# Patient Record
Sex: Female | Born: 1950
Health system: Southern US, Community
[De-identification: ages and names within clinical notes are randomized; demographics above are authoritative.]

## PROBLEM LIST (undated history)

## (undated) DIAGNOSIS — E669 Obesity, unspecified: Secondary | ICD-10-CM

## (undated) DIAGNOSIS — I509 Heart failure, unspecified: Secondary | ICD-10-CM

## (undated) DIAGNOSIS — I10 Essential (primary) hypertension: Secondary | ICD-10-CM

## (undated) DIAGNOSIS — I493 Ventricular premature depolarization: Secondary | ICD-10-CM

## (undated) HISTORY — PX: TUBAL LIGATION: SHX77

## (undated) HISTORY — PX: TONSILLECTOMY: SUR1361

## (undated) HISTORY — PX: CHOLECYSTECTOMY: SHX55

## (undated) HISTORY — PX: ABDOMINAL HYSTERECTOMY: SHX81

## (undated) HISTORY — DX: Obesity, unspecified: E66.9

## (undated) HISTORY — DX: Ventricular premature depolarization: I49.3

---

## 2001-05-13 ENCOUNTER — Emergency Department (HOSPITAL_COMMUNITY): Admission: EM | Admit: 2001-05-13 | Discharge: 2001-05-14 | Payer: Self-pay | Admitting: Emergency Medicine

## 2002-10-12 ENCOUNTER — Emergency Department (HOSPITAL_COMMUNITY): Admission: EM | Admit: 2002-10-12 | Discharge: 2002-10-12 | Payer: Self-pay | Admitting: Emergency Medicine

## 2002-10-12 ENCOUNTER — Encounter: Payer: Self-pay | Admitting: Emergency Medicine

## 2003-07-02 ENCOUNTER — Emergency Department (HOSPITAL_COMMUNITY): Admission: AD | Admit: 2003-07-02 | Discharge: 2003-07-02 | Payer: Self-pay | Admitting: Family Medicine

## 2004-10-12 ENCOUNTER — Emergency Department (HOSPITAL_COMMUNITY): Admission: EM | Admit: 2004-10-12 | Discharge: 2004-10-12 | Payer: Self-pay | Admitting: Family Medicine

## 2005-11-18 ENCOUNTER — Emergency Department (HOSPITAL_COMMUNITY): Admission: EM | Admit: 2005-11-18 | Discharge: 2005-11-18 | Payer: Self-pay | Admitting: Emergency Medicine

## 2007-03-29 ENCOUNTER — Emergency Department (HOSPITAL_COMMUNITY): Admission: EM | Admit: 2007-03-29 | Discharge: 2007-03-29 | Payer: Self-pay | Admitting: Emergency Medicine

## 2008-07-14 ENCOUNTER — Ambulatory Visit: Payer: Self-pay | Admitting: Hematology & Oncology

## 2008-08-29 ENCOUNTER — Ambulatory Visit: Payer: Self-pay | Admitting: Diagnostic Radiology

## 2008-08-29 ENCOUNTER — Ambulatory Visit: Payer: Self-pay | Admitting: Hematology & Oncology

## 2008-08-29 ENCOUNTER — Ambulatory Visit (HOSPITAL_BASED_OUTPATIENT_CLINIC_OR_DEPARTMENT_OTHER): Admission: RE | Admit: 2008-08-29 | Discharge: 2008-08-29 | Payer: Self-pay | Admitting: Hematology & Oncology

## 2008-08-29 LAB — CBC WITH DIFFERENTIAL (CANCER CENTER ONLY)
BASO#: 0 10*3/uL (ref 0.0–0.2)
Eosinophils Absolute: 0.1 10*3/uL (ref 0.0–0.5)
HCT: 50.5 % — ABNORMAL HIGH (ref 34.8–46.6)
HGB: 16.2 g/dL — ABNORMAL HIGH (ref 11.6–15.9)
LYMPH%: 38.7 % (ref 14.0–48.0)
MCV: 84 fL (ref 81–101)
MONO#: 0.5 10*3/uL (ref 0.1–0.9)
NEUT%: 47.6 % (ref 39.6–80.0)
RBC: 6.05 10*6/uL — ABNORMAL HIGH (ref 3.70–5.32)
WBC: 4.3 10*3/uL (ref 3.9–10.0)

## 2008-09-03 LAB — COMPREHENSIVE METABOLIC PANEL
AST: 19 U/L (ref 0–37)
Alkaline Phosphatase: 65 U/L (ref 39–117)
BUN: 13 mg/dL (ref 6–23)
Creatinine, Ser: 0.69 mg/dL (ref 0.40–1.20)
Total Bilirubin: 0.6 mg/dL (ref 0.3–1.2)

## 2008-09-03 LAB — SPEP & IFE WITH QIG
Alpha-2-Globulin: 9.8 % (ref 7.1–11.8)
Beta 2: 8.2 % — ABNORMAL HIGH (ref 3.2–6.5)
Beta Globulin: 5.6 % (ref 4.7–7.2)
IgA: 613 mg/dL — ABNORMAL HIGH (ref 68–378)
IgG (Immunoglobin G), Serum: 2630 mg/dL — ABNORMAL HIGH (ref 694–1618)

## 2008-09-03 LAB — KAPPA/LAMBDA LIGHT CHAINS
Kappa:Lambda Ratio: 0.62 (ref 0.26–1.65)
Lambda Free Lght Chn: 4.81 mg/dL — ABNORMAL HIGH (ref 0.57–2.63)

## 2008-10-03 LAB — CBC WITH DIFFERENTIAL (CANCER CENTER ONLY)
Eosinophils Absolute: 0.1 10*3/uL (ref 0.0–0.5)
HGB: 15.5 g/dL (ref 11.6–15.9)
MONO#: 0.4 10*3/uL (ref 0.1–0.9)
NEUT#: 2.4 10*3/uL (ref 1.5–6.5)
Platelets: 174 10*3/uL (ref 145–400)
RBC: 5.76 10*6/uL — ABNORMAL HIGH (ref 3.70–5.32)
WBC: 4.4 10*3/uL (ref 3.9–10.0)

## 2008-10-07 LAB — COMPREHENSIVE METABOLIC PANEL
ALT: 19 U/L (ref 0–35)
AST: 15 U/L (ref 0–37)
Albumin: 3.7 g/dL (ref 3.5–5.2)
Calcium: 9.3 mg/dL (ref 8.4–10.5)
Chloride: 101 mEq/L (ref 96–112)
Creatinine, Ser: 0.69 mg/dL (ref 0.40–1.20)
Potassium: 3.4 mEq/L — ABNORMAL LOW (ref 3.5–5.3)
Sodium: 141 mEq/L (ref 135–145)
Total Protein: 8.8 g/dL — ABNORMAL HIGH (ref 6.0–8.3)

## 2008-10-07 LAB — PROTEIN ELECTROPHORESIS, SERUM
Alpha-2-Globulin: 11.7 % (ref 7.1–11.8)
Gamma Globulin: 29.8 % — ABNORMAL HIGH (ref 11.1–18.8)

## 2008-10-07 LAB — KAPPA/LAMBDA LIGHT CHAINS: Kappa free light chain: 2.33 mg/dL — ABNORMAL HIGH (ref 0.33–1.94)

## 2009-02-12 ENCOUNTER — Ambulatory Visit: Payer: Self-pay | Admitting: Hematology & Oncology

## 2009-02-13 LAB — CBC WITH DIFFERENTIAL (CANCER CENTER ONLY)
EOS%: 3.1 % (ref 0.0–7.0)
Eosinophils Absolute: 0.1 10*3/uL (ref 0.0–0.5)
LYMPH#: 1.7 10*3/uL (ref 0.9–3.3)
MCH: 27.2 pg (ref 26.0–34.0)
MCHC: 33.7 g/dL (ref 32.0–36.0)
MONO%: 10.6 % (ref 0.0–13.0)
NEUT#: 2.2 10*3/uL (ref 1.5–6.5)
Platelets: 162 10*3/uL (ref 145–400)
RBC: 5.47 10*6/uL — ABNORMAL HIGH (ref 3.70–5.32)

## 2009-02-17 LAB — KAPPA/LAMBDA LIGHT CHAINS: Kappa free light chain: 1.85 mg/dL (ref 0.33–1.94)

## 2009-02-17 LAB — SPEP & IFE WITH QIG
Albumin ELP: 43.9 % — ABNORMAL LOW (ref 55.8–66.1)
IgA: 554 mg/dL — ABNORMAL HIGH (ref 68–378)
IgM, Serum: 80 mg/dL (ref 60–263)
Total Protein, Serum Electrophoresis: 8.2 g/dL (ref 6.0–8.3)

## 2009-02-17 LAB — COMPREHENSIVE METABOLIC PANEL
ALT: 20 U/L (ref 0–35)
Albumin: 3.6 g/dL (ref 3.5–5.2)
CO2: 26 mEq/L (ref 19–32)
Chloride: 104 mEq/L (ref 96–112)
Glucose, Bld: 111 mg/dL — ABNORMAL HIGH (ref 70–99)
Potassium: 3.7 mEq/L (ref 3.5–5.3)
Sodium: 141 mEq/L (ref 135–145)
Total Bilirubin: 0.6 mg/dL (ref 0.3–1.2)
Total Protein: 8.2 g/dL (ref 6.0–8.3)

## 2009-06-18 ENCOUNTER — Ambulatory Visit: Payer: Self-pay | Admitting: Hematology & Oncology

## 2009-06-19 LAB — CBC WITH DIFFERENTIAL (CANCER CENTER ONLY)
BASO#: 0 10*3/uL (ref 0.0–0.2)
Eosinophils Absolute: 0.1 10*3/uL (ref 0.0–0.5)
HCT: 48.1 % — ABNORMAL HIGH (ref 34.8–46.6)
HGB: 15.7 g/dL (ref 11.6–15.9)
LYMPH%: 38 % (ref 14.0–48.0)
MCH: 26.2 pg (ref 26.0–34.0)
MCHC: 32.6 g/dL (ref 32.0–36.0)
MCV: 80 fL — ABNORMAL LOW (ref 81–101)
MONO#: 0.4 10*3/uL (ref 0.1–0.9)
NEUT#: 2.6 10*3/uL (ref 1.5–6.5)
NEUT%: 51.5 % (ref 39.6–80.0)
RBC: 5.99 10*6/uL — ABNORMAL HIGH (ref 3.70–5.32)
RDW: 13.2 % (ref 10.5–14.6)
WBC: 5 10*3/uL (ref 3.9–10.0)

## 2009-06-19 LAB — TECHNOLOGIST REVIEW CHCC SATELLITE

## 2009-06-23 LAB — COMPREHENSIVE METABOLIC PANEL
ALT: 28 U/L (ref 0–35)
BUN: 14 mg/dL (ref 6–23)
CO2: 27 mEq/L (ref 19–32)
Calcium: 8.9 mg/dL (ref 8.4–10.5)
Chloride: 102 mEq/L (ref 96–112)
Creatinine, Ser: 0.73 mg/dL (ref 0.40–1.20)
Glucose, Bld: 125 mg/dL — ABNORMAL HIGH (ref 70–99)

## 2009-06-23 LAB — SPEP & IFE WITH QIG
Alpha-1-Globulin: 3.5 % (ref 2.9–4.9)
Alpha-2-Globulin: 10.3 % (ref 7.1–11.8)
Gamma Globulin: 28.3 % — ABNORMAL HIGH (ref 11.1–18.8)

## 2009-06-23 LAB — KAPPA/LAMBDA LIGHT CHAINS
Kappa free light chain: 2.39 mg/dL — ABNORMAL HIGH (ref 0.33–1.94)
Lambda Free Lght Chn: 4.01 mg/dL — ABNORMAL HIGH (ref 0.57–2.63)

## 2009-06-23 LAB — BETA 2 MICROGLOBULIN, SERUM: Beta-2 Microglobulin: 2.77 mg/L — ABNORMAL HIGH (ref 1.01–1.73)

## 2009-10-29 ENCOUNTER — Ambulatory Visit: Payer: Self-pay | Admitting: Hematology & Oncology

## 2009-10-30 LAB — CBC WITH DIFFERENTIAL (CANCER CENTER ONLY)
BASO%: 1.2 % (ref 0.0–2.0)
EOS%: 2.3 % (ref 0.0–7.0)
LYMPH#: 1.9 10*3/uL (ref 0.9–3.3)
MCHC: 32.7 g/dL (ref 32.0–36.0)
NEUT#: 1.7 10*3/uL (ref 1.5–6.5)
Platelets: 159 10*3/uL (ref 145–400)
RDW: 14.1 % (ref 10.5–14.6)

## 2009-11-03 LAB — PROTEIN ELECTROPHORESIS, SERUM
Alpha-1-Globulin: 3.7 % (ref 2.9–4.9)
Beta 2: 8.2 % — ABNORMAL HIGH (ref 3.2–6.5)
Beta Globulin: 6.3 % (ref 4.7–7.2)
Gamma Globulin: 28.8 % — ABNORMAL HIGH (ref 11.1–18.8)

## 2009-11-03 LAB — COMPREHENSIVE METABOLIC PANEL
AST: 18 U/L (ref 0–37)
Albumin: 3.6 g/dL (ref 3.5–5.2)
Alkaline Phosphatase: 61 U/L (ref 39–117)
Potassium: 3.3 mEq/L — ABNORMAL LOW (ref 3.5–5.3)
Sodium: 140 mEq/L (ref 135–145)
Total Protein: 7.9 g/dL (ref 6.0–8.3)

## 2009-11-03 LAB — KAPPA/LAMBDA LIGHT CHAINS
Kappa free light chain: 2.18 mg/dL — ABNORMAL HIGH (ref 0.33–1.94)
Kappa:Lambda Ratio: 0.76 (ref 0.26–1.65)

## 2010-04-02 ENCOUNTER — Ambulatory Visit: Payer: Self-pay | Admitting: Hematology & Oncology

## 2010-04-02 LAB — CBC WITH DIFFERENTIAL (CANCER CENTER ONLY)
BASO%: 0.9 % (ref 0.0–2.0)
Eosinophils Absolute: 0.1 10*3/uL (ref 0.0–0.5)
LYMPH#: 1.7 10*3/uL (ref 0.9–3.3)
LYMPH%: 32.4 % (ref 14.0–48.0)
MCV: 82 fL (ref 81–101)
MONO#: 0.4 10*3/uL (ref 0.1–0.9)
NEUT#: 2.9 10*3/uL (ref 1.5–6.5)
Platelets: 205 10*3/uL (ref 145–400)
RBC: 5.73 10*6/uL — ABNORMAL HIGH (ref 3.70–5.32)
WBC: 5.2 10*3/uL (ref 3.9–10.0)

## 2010-04-02 LAB — TECHNOLOGIST REVIEW CHCC SATELLITE

## 2010-04-06 LAB — COMPREHENSIVE METABOLIC PANEL
ALT: 20 U/L (ref 0–35)
AST: 15 U/L (ref 0–37)
Albumin: 4.1 g/dL (ref 3.5–5.2)
CO2: 25 mEq/L (ref 19–32)
Calcium: 9 mg/dL (ref 8.4–10.5)
Chloride: 104 mEq/L (ref 96–112)
Potassium: 3.7 mEq/L (ref 3.5–5.3)
Sodium: 139 mEq/L (ref 135–145)
Total Protein: 8.5 g/dL — ABNORMAL HIGH (ref 6.0–8.3)

## 2010-04-06 LAB — SPEP & IFE WITH QIG
Alpha-1-Globulin: 3.6 % (ref 2.9–4.9)
Alpha-2-Globulin: 10 % (ref 7.1–11.8)
Gamma Globulin: 29.2 % — ABNORMAL HIGH (ref 11.1–18.8)
IgM, Serum: 74 mg/dL (ref 60–263)
Total Protein, Serum Electrophoresis: 8.5 g/dL — ABNORMAL HIGH (ref 6.0–8.3)

## 2010-04-06 LAB — KAPPA/LAMBDA LIGHT CHAINS: Kappa free light chain: 1.7 mg/dL (ref 0.33–1.94)

## 2010-04-16 ENCOUNTER — Encounter: Admission: RE | Admit: 2010-04-16 | Discharge: 2010-04-16 | Payer: Self-pay | Admitting: Hematology & Oncology

## 2010-07-01 ENCOUNTER — Emergency Department (INDEPENDENT_AMBULATORY_CARE_PROVIDER_SITE_OTHER): Payer: 59

## 2010-07-01 ENCOUNTER — Emergency Department (HOSPITAL_BASED_OUTPATIENT_CLINIC_OR_DEPARTMENT_OTHER)
Admission: EM | Admit: 2010-07-01 | Discharge: 2010-07-02 | Disposition: A | Discharge status: Home / Self Care | Payer: 59 | Attending: Emergency Medicine | Admitting: Emergency Medicine

## 2010-07-01 ENCOUNTER — Encounter (HOSPITAL_BASED_OUTPATIENT_CLINIC_OR_DEPARTMENT_OTHER): Payer: Self-pay | Admitting: Radiology

## 2010-07-01 DIAGNOSIS — M1710 Unilateral primary osteoarthritis, unspecified knee: Secondary | ICD-10-CM

## 2010-07-01 DIAGNOSIS — M79609 Pain in unspecified limb: Secondary | ICD-10-CM

## 2010-07-01 DIAGNOSIS — IMO0002 Reserved for concepts with insufficient information to code with codable children: Secondary | ICD-10-CM

## 2010-07-01 DIAGNOSIS — Z86718 Personal history of other venous thrombosis and embolism: Secondary | ICD-10-CM | POA: Insufficient documentation

## 2010-07-01 DIAGNOSIS — M7989 Other specified soft tissue disorders: Secondary | ICD-10-CM

## 2010-07-01 DIAGNOSIS — I10 Essential (primary) hypertension: Secondary | ICD-10-CM | POA: Insufficient documentation

## 2010-07-01 HISTORY — DX: Heart failure, unspecified: I50.9

## 2010-07-02 ENCOUNTER — Ambulatory Visit (INDEPENDENT_AMBULATORY_CARE_PROVIDER_SITE_OTHER)
Admission: RE | Admit: 2010-07-02 | Discharge: 2010-07-02 | Disposition: A | Discharge status: Home / Self Care | Payer: 59 | Source: Ambulatory Visit | Attending: Emergency Medicine | Admitting: Emergency Medicine

## 2010-07-02 ENCOUNTER — Other Ambulatory Visit (HOSPITAL_BASED_OUTPATIENT_CLINIC_OR_DEPARTMENT_OTHER): Payer: Self-pay | Admitting: Emergency Medicine

## 2010-07-02 DIAGNOSIS — R52 Pain, unspecified: Secondary | ICD-10-CM

## 2010-07-14 ENCOUNTER — Emergency Department (HOSPITAL_BASED_OUTPATIENT_CLINIC_OR_DEPARTMENT_OTHER)
Admission: EM | Admit: 2010-07-14 | Discharge: 2010-07-14 | Disposition: A | Discharge status: Home / Self Care | Payer: 59 | Attending: Emergency Medicine | Admitting: Emergency Medicine

## 2010-07-14 DIAGNOSIS — M25559 Pain in unspecified hip: Secondary | ICD-10-CM | POA: Insufficient documentation

## 2010-07-14 DIAGNOSIS — M76899 Other specified enthesopathies of unspecified lower limb, excluding foot: Secondary | ICD-10-CM | POA: Insufficient documentation

## 2010-07-14 LAB — URINE MICROSCOPIC-ADD ON

## 2010-07-14 LAB — URINALYSIS, ROUTINE W REFLEX MICROSCOPIC
Ketones, ur: NEGATIVE mg/dL
Urine Glucose, Fasting: NEGATIVE mg/dL
pH: 6 (ref 5.0–8.0)

## 2010-10-01 ENCOUNTER — Other Ambulatory Visit: Payer: Self-pay | Admitting: Hematology & Oncology

## 2010-10-01 ENCOUNTER — Encounter (HOSPITAL_BASED_OUTPATIENT_CLINIC_OR_DEPARTMENT_OTHER): Payer: 59 | Admitting: Hematology & Oncology

## 2010-10-01 DIAGNOSIS — D472 Monoclonal gammopathy: Secondary | ICD-10-CM

## 2010-10-01 LAB — CBC WITH DIFFERENTIAL (CANCER CENTER ONLY)
BASO%: 0.4 % (ref 0.0–2.0)
EOS%: 1.8 % (ref 0.0–7.0)
HCT: 45.1 % (ref 34.8–46.6)
LYMPH%: 40.8 % (ref 14.0–48.0)
MCHC: 33 g/dL (ref 32.0–36.0)
MCV: 80 fL — ABNORMAL LOW (ref 81–101)
MONO#: 0.6 10*3/uL (ref 0.1–0.9)
MONO%: 12.7 % (ref 0.0–13.0)
NEUT%: 44.3 % (ref 39.6–80.0)
Platelets: 166 10*3/uL (ref 145–400)
RDW: 14.7 % (ref 11.1–15.7)
WBC: 4.5 10*3/uL (ref 3.9–10.0)

## 2010-10-05 LAB — COMPREHENSIVE METABOLIC PANEL
ALT: 16 U/L (ref 0–35)
AST: 17 U/L (ref 0–37)
CO2: 27 mEq/L (ref 19–32)
Chloride: 101 mEq/L (ref 96–112)
Creatinine, Ser: 0.66 mg/dL (ref 0.40–1.20)
Sodium: 140 mEq/L (ref 135–145)
Total Bilirubin: 0.5 mg/dL (ref 0.3–1.2)
Total Protein: 8.2 g/dL (ref 6.0–8.3)

## 2010-10-05 LAB — SPEP & IFE WITH QIG
Albumin ELP: 44.2 % — ABNORMAL LOW (ref 55.8–66.1)
Alpha-1-Globulin: 3.6 % (ref 2.9–4.9)
Alpha-2-Globulin: 10.2 % (ref 7.1–11.8)
Gamma Globulin: 28.2 % — ABNORMAL HIGH (ref 11.1–18.8)
IgM, Serum: 74 mg/dL (ref 60–263)
Total Protein, Serum Electrophoresis: 8.2 g/dL (ref 6.0–8.3)

## 2010-10-05 LAB — LACTATE DEHYDROGENASE: LDH: 149 U/L (ref 94–250)

## 2010-10-05 LAB — BETA 2 MICROGLOBULIN, SERUM: Beta-2 Microglobulin: 2.2 mg/L — ABNORMAL HIGH (ref 1.01–1.73)

## 2011-03-23 ENCOUNTER — Other Ambulatory Visit: Payer: Self-pay | Admitting: Hematology & Oncology

## 2011-03-23 DIAGNOSIS — Z1231 Encounter for screening mammogram for malignant neoplasm of breast: Secondary | ICD-10-CM

## 2011-04-11 ENCOUNTER — Encounter: Payer: Self-pay | Admitting: *Deleted

## 2011-04-18 ENCOUNTER — Ambulatory Visit
Admission: RE | Admit: 2011-04-18 | Discharge: 2011-04-18 | Disposition: A | Discharge status: Home / Self Care | Payer: 59 | Source: Ambulatory Visit | Attending: Hematology & Oncology | Admitting: Hematology & Oncology

## 2011-04-18 DIAGNOSIS — Z1231 Encounter for screening mammogram for malignant neoplasm of breast: Secondary | ICD-10-CM

## 2011-04-22 ENCOUNTER — Ambulatory Visit: Payer: 59 | Admitting: Hematology & Oncology

## 2011-04-22 ENCOUNTER — Other Ambulatory Visit: Payer: 59 | Admitting: Lab

## 2011-04-22 ENCOUNTER — Telehealth: Payer: Self-pay | Admitting: *Deleted

## 2011-04-22 NOTE — Telephone Encounter (Signed)
Pt cx 11-23 moved to 12-26 RN aware

## 2011-05-25 ENCOUNTER — Other Ambulatory Visit: Payer: Self-pay | Admitting: Hematology & Oncology

## 2011-05-25 ENCOUNTER — Other Ambulatory Visit (HOSPITAL_BASED_OUTPATIENT_CLINIC_OR_DEPARTMENT_OTHER): Payer: 59 | Admitting: Lab

## 2011-05-25 ENCOUNTER — Ambulatory Visit (HOSPITAL_BASED_OUTPATIENT_CLINIC_OR_DEPARTMENT_OTHER): Payer: 59 | Admitting: Hematology & Oncology

## 2011-05-25 VITALS — BP 155/88 | HR 76 | Temp 98.0°F | Ht 65.0 in | Wt 294.0 lb

## 2011-05-25 DIAGNOSIS — D472 Monoclonal gammopathy: Secondary | ICD-10-CM

## 2011-05-25 DIAGNOSIS — M7989 Other specified soft tissue disorders: Secondary | ICD-10-CM

## 2011-05-25 LAB — LACTATE DEHYDROGENASE
LDH: 183 U/L (ref 94–250)
LDH: 183 U/L (ref 94–250)

## 2011-05-25 LAB — CBC WITH DIFFERENTIAL (CANCER CENTER ONLY)
BASO#: 0 10*3/uL (ref 0.0–0.2)
BASO%: 0.4 % (ref 0.0–2.0)
EOS%: 2.7 % (ref 0.0–7.0)
HGB: 15.2 g/dL (ref 11.6–15.9)
LYMPH#: 1.9 10*3/uL (ref 0.9–3.3)
MCHC: 32.6 g/dL (ref 32.0–36.0)
NEUT#: 2.3 10*3/uL (ref 1.5–6.5)
Platelets: 158 10*3/uL (ref 145–400)

## 2011-05-25 LAB — VITAMIN D 25 HYDROXY (VIT D DEFICIENCY, FRACTURES): Vit D, 25-Hydroxy: 12 ng/mL — ABNORMAL LOW (ref 30–89)

## 2011-05-25 NOTE — Progress Notes (Signed)
This office note has been dictated.

## 2011-05-25 NOTE — Progress Notes (Signed)
CC:   Michaela Baker. Michaela Baker, M.D.  DIAGNOSIS:  IgG lambda monoclonal gammopathy of undetermined significance.  CURRENT THERAPY:  Observation.  INTERIM HISTORY:  Michaela Baker comes in for followup.  We last saw her back in May.  She had a nice summer.  She really did not do all that much.  She enjoyed the holidays with her family.  When we last saw her, there was no monoclonal spike in her serum.  Her IgG level was 2580 mg/dL.  Her serum lambda light chain was 3.93 mg/dL.  She has had some leg swelling.  This comes and goes.  She is taking fish oil.  This really has helped her arthralgias.  She has had no change in bowel or bladder habits.  She has not noticed any bleeding.  She said her mammogram was about a month or so ago.  PHYSICAL EXAMINATION:  General:  This is an obese black female in no obvious distress.  Vital Signs:  Temperature 98, pulse 76, respiratory rate 16, blood pressure 155/88.  Weight is 294.  Head/Neck: Exam shows a normocephalic and atraumatic skull.  There are no ocular or oral lesions.  There are no palpable cervical or supraclavicular lymph nodes. Lungs:  Clear bilaterally.  Cardiac:  Regular rate and rhythm with a normal S1 and S2.  There are no murmurs, rubs or bruits.  Abdomen:  Soft with good bowel sounds.  There is no palpable abdominal mass.  There is no fluid wave.  There is no palpable hepatosplenomegaly.  She does have a laparotomy scar that is well healed.  Back:  No tenderness over the spine, ribs or hips.  Extremities:  No clubbing or cyanosis.  Some trace edema in her legs bilaterally.  She has no erythema or warmth over her joints.  Skin:  No ecchymoses or petechiae.  LABORATORY STUDIES:  White cell count 4.8, hemoglobin 15.2, hematocrit 46.6, platelet count 158.  MCV is 82.  IMPRESSION:  Michaela Baker is a 60 year old African American female with an IgG lambda monoclonal gammopathy of undetermined significance.  This has really not been a  problem.  I think we can probably get her back in 8 months now for followup.  I do not see any need for x-rays or invasive studies on her.   ______________________________ Josph Macho, M.D. PRE/MEDQ  D:  05/25/2011  T:  05/25/2011  Job:  [id number]   ADDENDUM:  (-) M-spike.  IgG is 3000 mg/dL. LAMBDA is 4.64 mg/dL

## 2011-05-26 LAB — LACTATE DEHYDROGENASE: LDH: 183 U/L (ref 94–250)

## 2011-05-26 LAB — VITAMIN D 25 HYDROXY (VIT D DEFICIENCY, FRACTURES): Vit D, 25-Hydroxy: 12 ng/mL — ABNORMAL LOW (ref 30–89)

## 2011-05-26 LAB — KAPPA/LAMBDA LIGHT CHAINS: Kappa:Lambda Ratio: 0.94 (ref 0.26–1.65)

## 2011-05-27 LAB — SPEP & IFE WITH QIG
Albumin ELP: 42.8 % — ABNORMAL LOW (ref 55.8–66.1)
Beta 2: 8.5 % — ABNORMAL HIGH (ref 3.2–6.5)
IgA: 733 mg/dL — ABNORMAL HIGH (ref 69–380)
IgM, Serum: 79 mg/dL (ref 52–322)

## 2012-01-02 ENCOUNTER — Ambulatory Visit (HOSPITAL_BASED_OUTPATIENT_CLINIC_OR_DEPARTMENT_OTHER): Payer: 59 | Admitting: Hematology & Oncology

## 2012-01-02 ENCOUNTER — Other Ambulatory Visit (HOSPITAL_BASED_OUTPATIENT_CLINIC_OR_DEPARTMENT_OTHER): Payer: 59 | Admitting: Lab

## 2012-01-02 VITALS — BP 139/81 | HR 77 | Temp 97.5°F | Resp 20 | Wt 294.0 lb

## 2012-01-02 DIAGNOSIS — D472 Monoclonal gammopathy: Secondary | ICD-10-CM

## 2012-01-02 DIAGNOSIS — M7989 Other specified soft tissue disorders: Secondary | ICD-10-CM

## 2012-01-02 LAB — CBC WITH DIFFERENTIAL (CANCER CENTER ONLY)
BASO#: 0 10*3/uL (ref 0.0–0.2)
EOS%: 2.5 % (ref 0.0–7.0)
MCH: 26.9 pg (ref 26.0–34.0)
MCHC: 32.6 g/dL (ref 32.0–36.0)
MONO%: 14.3 % — ABNORMAL HIGH (ref 0.0–13.0)
NEUT#: 1.7 10*3/uL (ref 1.5–6.5)
Platelets: 183 10*3/uL (ref 145–400)

## 2012-01-02 NOTE — Progress Notes (Signed)
CC:   Sandford Craze, NP  DIAGNOSIS:  IgG Lambda MGUS.  CURRENT THERAPY:  Observation.  INTERIM HISTORY:  Ms. Dunphy comes in for followup.  She seems to be doing okay.  She has had no problems since we last saw her in December. When we saw her at that point time, there was no monoclonal spike noted in her serum.  Her IgG level was 3000 mg/dL.  Her lambda light chain was 4.64 mg/dL.  She has leg swelling.  This is a chronic problem for her.  She has had no infections.  There has been no cough.  She has had no change in bowel or bladder habits.  She said her last mammogram was back in November of 2012. __________mammogram.  PHYSICAL EXAMINATION:  General:  This is an obese black female in no obvious distress.  Vital signs:  97.5, pulse 77, respiratory rate 20, blood pressure 139/81.  Weight is 294.  Head and neck:  Shows a normocephalic, atraumatic skull.  There are no ocular or oral lesions. There are no palpable cervical or supraclavicular lymph nodes.  Lungs: Clear bilaterally.  Cardiac:  Regular rate and rhythm with a normal S1, S2.  There are no murmurs, rubs or bruits.  Abdomen:  Soft with good bowel sounds.  There is no palpable abdominal mass.  There is no fluid wave.  There is no palpable hepatosplenomegaly.  Extremities:  Shows chronic pitting edema of the lower legs and feet.  No ulcerations noted. Skin:  No ecchymosis or petechia.  LABORATORY STUDIES:  White cell count is 4.8, hemoglobin 15.6, hematocrit 47.9, platelet count 183.  IMPRESSION:  Ms. Aldaz is a 61 year old African American female with an IgA lambda MGUS (monoclonal gammopathy of undetermined significance). She actually has not had any monoclonal spike noted in her serum.  For now, we will just plan to get her back in 6 months' time.  I just do not see any evidence of any kind of progression of this MGUS.  She is certainly not anemic.  She appears asymptomatic with  it.    ______________________________ Josph Macho, M.D. PRE/MEDQ  D:  12/31/2011  T:  01/02/2012  Job:  [id number]

## 2012-01-02 NOTE — Progress Notes (Signed)
This office note has been dictated.

## 2012-01-04 LAB — PROTEIN ELECTROPHORESIS, SERUM, WITH REFLEX
Albumin ELP: 42.6 % — ABNORMAL LOW (ref 55.8–66.1)
Alpha-2-Globulin: 10.1 % (ref 7.1–11.8)
Beta 2: 8.1 % — ABNORMAL HIGH (ref 3.2–6.5)
Beta Globulin: 6.1 % (ref 4.7–7.2)

## 2012-01-04 LAB — IFE INTERPRETATION

## 2012-01-04 LAB — IGG, IGA, IGM: IgA: 843 mg/dL — ABNORMAL HIGH (ref 69–380)

## 2012-03-27 ENCOUNTER — Other Ambulatory Visit: Payer: Self-pay | Admitting: Hematology & Oncology

## 2012-03-27 DIAGNOSIS — Z1231 Encounter for screening mammogram for malignant neoplasm of breast: Secondary | ICD-10-CM

## 2012-05-11 ENCOUNTER — Ambulatory Visit (HOSPITAL_COMMUNITY)
Admission: RE | Admit: 2012-05-11 | Discharge: 2012-05-11 | Disposition: A | Discharge status: Home / Self Care | Payer: Self-pay | Source: Ambulatory Visit | Attending: Hematology & Oncology | Admitting: Hematology & Oncology

## 2012-05-11 DIAGNOSIS — Z1231 Encounter for screening mammogram for malignant neoplasm of breast: Secondary | ICD-10-CM

## 2012-07-03 ENCOUNTER — Telehealth: Payer: Self-pay | Admitting: Hematology & Oncology

## 2012-07-03 NOTE — Telephone Encounter (Signed)
Called patient to reschedule 07/04/12 appointment due to [facility] being ill.  Patient stated they no longer have insurance coverage and would like to cancel.  They will call back to reschedule once new benefits are obtained.

## 2012-07-04 ENCOUNTER — Other Ambulatory Visit: Payer: 59 | Admitting: Lab

## 2012-07-04 ENCOUNTER — Ambulatory Visit: Payer: 59 | Admitting: Medical

## 2012-10-30 ENCOUNTER — Emergency Department (HOSPITAL_BASED_OUTPATIENT_CLINIC_OR_DEPARTMENT_OTHER)
Admission: EM | Admit: 2012-10-30 | Discharge: 2012-10-30 | Disposition: A | Discharge status: Home / Self Care | Payer: Self-pay | Attending: Emergency Medicine | Admitting: Emergency Medicine

## 2012-10-30 ENCOUNTER — Emergency Department (HOSPITAL_BASED_OUTPATIENT_CLINIC_OR_DEPARTMENT_OTHER): Payer: Self-pay

## 2012-10-30 ENCOUNTER — Encounter (HOSPITAL_BASED_OUTPATIENT_CLINIC_OR_DEPARTMENT_OTHER): Payer: Self-pay | Admitting: *Deleted

## 2012-10-30 DIAGNOSIS — L02619 Cutaneous abscess of unspecified foot: Secondary | ICD-10-CM | POA: Insufficient documentation

## 2012-10-30 DIAGNOSIS — Z79899 Other long term (current) drug therapy: Secondary | ICD-10-CM | POA: Insufficient documentation

## 2012-10-30 DIAGNOSIS — L03119 Cellulitis of unspecified part of limb: Secondary | ICD-10-CM | POA: Insufficient documentation

## 2012-10-30 DIAGNOSIS — I10 Essential (primary) hypertension: Secondary | ICD-10-CM | POA: Insufficient documentation

## 2012-10-30 DIAGNOSIS — M25539 Pain in unspecified wrist: Secondary | ICD-10-CM | POA: Insufficient documentation

## 2012-10-30 DIAGNOSIS — I509 Heart failure, unspecified: Secondary | ICD-10-CM | POA: Insufficient documentation

## 2012-10-30 DIAGNOSIS — L03115 Cellulitis of right lower limb: Secondary | ICD-10-CM

## 2012-10-30 DIAGNOSIS — M25531 Pain in right wrist: Secondary | ICD-10-CM

## 2012-10-30 HISTORY — DX: Essential (primary) hypertension: I10

## 2012-10-30 MED ORDER — INDOMETHACIN 25 MG PO CAPS: 25.0000 mg | ORAL_CAPSULE | Freq: Three times a day (TID) | ORAL | Status: DC | PRN

## 2012-10-30 MED ORDER — CEPHALEXIN 500 MG PO CAPS: 500.0000 mg | ORAL_CAPSULE | Freq: Four times a day (QID) | ORAL | Status: DC

## 2012-10-30 NOTE — ED Provider Notes (Signed)
History     CSN: [id number]  Arrival date & time 10/30/12  2018   First MD Initiated Contact with Patient 10/30/12 2125      Chief Complaint  Patient presents with  . Wrist Pain  . Foot Pain    (Consider location/radiation/quality/duration/timing/severity/associated sxs/prior treatment) HPI Comments: Patient presents with pain in the right foot and wrist since yesterday.  There was no injury or trauma.  No fevers or chills.  Denies an history of gout.  Patient is a 62 y.o. female presenting with wrist pain. The history is provided by the patient.  Wrist Pain This is a new problem. The current episode started yesterday. The problem occurs constantly. The problem has been rapidly worsening. Nothing aggravates the symptoms. Nothing relieves the symptoms. She has tried nothing for the symptoms. The treatment provided no relief.    Past Medical History  Diagnosis Date  . CHF (congestive heart failure)   . Hypertension     Past Surgical History  Procedure Laterality Date  . Cholecystectomy    . Abdominal hysterectomy    . Tonsillectomy    . Tubal ligation      History reviewed. No pertinent family history.  History  Substance Use Topics  . Smoking status: Never Smoker   . Smokeless tobacco: Not on file  . Alcohol Use: No    OB History   Grav Para Term Preterm Abortions TAB SAB Ect Mult Living                  Review of Systems  All other systems reviewed and are negative.    Allergies  Review of patient's allergies indicates no known allergies.  Home Medications   Current Outpatient Rx  Name  Route  Sig  Dispense  Refill  . amLODipine (NORVASC) 10 MG tablet   Oral   Take 10 mg by mouth daily.           Marland Kitchen glucosamine-chondroitin 500-400 MG tablet   Oral   Take 1 tablet by mouth 2 (two) times daily.         Marland Kitchen lisinopril (PRINIVIL,ZESTRIL) 10 MG tablet   Oral   Take 10 mg by mouth 2 (two) times daily.          . metoprolol succinate (TOPROL-XL)  25 MG 24 hr tablet   Oral   Take 25 mg by mouth daily.           . Omega-3 300 MG CAPS   Oral   Take 3 tablets by mouth daily.           BP 184/70  Pulse 90  Temp(Src) 98.6 F (37 C) (Oral)  Resp 18  Ht 5\' 6"  (1.676 m)  Wt 300 lb (136.079 kg)  BMI 48.44 kg/m2  SpO2 96%  Physical Exam  Nursing note and vitals reviewed. Constitutional: She is oriented to person, place, and time. She appears well-developed and well-nourished. No distress.  HENT:  Head: Normocephalic.  Mouth/Throat: Oropharynx is clear and moist.  Neck: Normal range of motion. Neck supple.  Musculoskeletal:  The right foot is ttp over the posterolateral heel.  There is a small break in the skin with surrounding erythema and warmth.    The right wrist is noted to have pain with range of motion.  It is somewhat swollen, but there is no redness or erythema.  Neurological: She is alert and oriented to person, place, and time.  Skin: Skin is warm and dry. She is  not diaphoretic.    ED Course  Procedures (including critical care time)  Labs Reviewed - No data to display No results found.   No diagnosis found.    MDM  This appears to be cellulitis of the foot and some sort of arthritic process in the wrist.  Will treat with indocin and keflex.  To follow up prn.        Geoffery Lyons, MD 10/31/12 [phone]

## 2012-10-30 NOTE — ED Notes (Signed)
Pt c/o right foot and right wrist pain w/o injury x 1 day

## 2013-05-14 ENCOUNTER — Other Ambulatory Visit: Payer: Self-pay | Admitting: Family Medicine

## 2013-05-14 DIAGNOSIS — Z1231 Encounter for screening mammogram for malignant neoplasm of breast: Secondary | ICD-10-CM

## 2013-06-04 ENCOUNTER — Ambulatory Visit (HOSPITAL_COMMUNITY)
Admission: RE | Admit: 2013-06-04 | Discharge: 2013-06-04 | Disposition: A | Discharge status: Home / Self Care | Payer: BC Managed Care – PPO | Source: Ambulatory Visit | Attending: Family Medicine | Admitting: Family Medicine

## 2013-06-04 DIAGNOSIS — Z1231 Encounter for screening mammogram for malignant neoplasm of breast: Secondary | ICD-10-CM

## 2013-06-07 ENCOUNTER — Telehealth: Payer: Self-pay | Admitting: Family Medicine

## 2013-06-07 NOTE — Telephone Encounter (Signed)
Number not valid

## 2013-07-21 ENCOUNTER — Encounter (HOSPITAL_COMMUNITY): Payer: Self-pay | Admitting: Emergency Medicine

## 2013-07-21 ENCOUNTER — Emergency Department (HOSPITAL_COMMUNITY): Payer: BC Managed Care – PPO

## 2013-07-21 ENCOUNTER — Inpatient Hospital Stay (HOSPITAL_COMMUNITY)
Admission: EM | Admit: 2013-07-21 | Discharge: 2013-07-26 | DRG: 292 | Disposition: A | Discharge status: Home / Self Care | Payer: BC Managed Care – PPO | Attending: Internal Medicine | Admitting: Internal Medicine

## 2013-07-21 DIAGNOSIS — Z8249 Family history of ischemic heart disease and other diseases of the circulatory system: Secondary | ICD-10-CM

## 2013-07-21 DIAGNOSIS — Z6841 Body Mass Index (BMI) 40.0 and over, adult: Secondary | ICD-10-CM

## 2013-07-21 DIAGNOSIS — I10 Essential (primary) hypertension: Secondary | ICD-10-CM | POA: Diagnosis present

## 2013-07-21 DIAGNOSIS — D472 Monoclonal gammopathy: Secondary | ICD-10-CM | POA: Diagnosis not present

## 2013-07-21 DIAGNOSIS — Z79899 Other long term (current) drug therapy: Secondary | ICD-10-CM

## 2013-07-21 DIAGNOSIS — I509 Heart failure, unspecified: Secondary | ICD-10-CM | POA: Diagnosis present

## 2013-07-21 DIAGNOSIS — I5031 Acute diastolic (congestive) heart failure: Principal | ICD-10-CM | POA: Diagnosis present

## 2013-07-21 DIAGNOSIS — Z833 Family history of diabetes mellitus: Secondary | ICD-10-CM

## 2013-07-21 LAB — CBC
HEMATOCRIT: 47.9 % — AB (ref 36.0–46.0)
HEMOGLOBIN: 14.9 g/dL (ref 12.0–15.0)
MCH: 26.4 pg (ref 26.0–34.0)
MCHC: 31.1 g/dL (ref 30.0–36.0)
MCV: 84.8 fL (ref 78.0–100.0)
Platelets: 161 10*3/uL (ref 150–400)
RBC: 5.65 MIL/uL — AB (ref 3.87–5.11)
RDW: 14.4 % (ref 11.5–15.5)
WBC: 4.7 10*3/uL (ref 4.0–10.5)

## 2013-07-21 LAB — BASIC METABOLIC PANEL
BUN: 9 mg/dL (ref 6–23)
CALCIUM: 8.9 mg/dL (ref 8.4–10.5)
CO2: 30 meq/L (ref 19–32)
Chloride: 100 mEq/L (ref 96–112)
Creatinine, Ser: 0.76 mg/dL (ref 0.50–1.10)
GFR calc Af Amer: >90 mL/min (ref >90)
GFR, EST NON AFRICAN AMERICAN: 88 mL/min — AB (ref >90)
GLUCOSE: 120 mg/dL — AB (ref 70–99)
Potassium: 3.8 mEq/L (ref 3.7–5.3)
SODIUM: 141 meq/L (ref 137–147)

## 2013-07-21 LAB — I-STAT TROPONIN, ED: TROPONIN I, POC: 0.07 ng/mL (ref 0.00–0.08)

## 2013-07-21 LAB — PRO B NATRIURETIC PEPTIDE: Pro B Natriuretic peptide (BNP): 733.6 pg/mL — ABNORMAL HIGH (ref 0–125)

## 2013-07-21 MED ORDER — ALBUTEROL SULFATE (2.5 MG/3ML) 0.083% IN NEBU
5.0000 mg | INHALATION_SOLUTION | Freq: Once | RESPIRATORY_TRACT | Status: AC
  Administered 2013-07-21: 5 mg via RESPIRATORY_TRACT
  Filled 2013-07-21: qty 6

## 2013-07-21 MED ORDER — FUROSEMIDE 10 MG/ML IJ SOLN
40.0000 mg | Freq: Once | INTRAMUSCULAR | Status: AC
  Administered 2013-07-21: 40 mg via INTRAVENOUS
  Filled 2013-07-21: qty 4

## 2013-07-21 NOTE — ED Provider Notes (Signed)
Medical screening examination/treatment/procedure(s) were performed by non-physician practitioner and as supervising physician I was immediately available for consultation/collaboration.  EKG Interpretation    Date/Time:  Sunday July 21 2013 21:00:51 EST Ventricular Rate:  63 PR Interval:  215 QRS Duration: 94 QT Interval:  432 QTC Calculation: 442 R Axis:   32 Text Interpretation:  Sinus rhythm Borderline prolonged PR interval Baseline wander in lead(s) II III aVF V3 V4 No old tracing to compare Confirmed by Oria Klimas  MD, Clotiel Troop ([id number]) on 07/21/2013 11:13:50 PM              Malvin Johns, MD 07/21/13 2352

## 2013-07-21 NOTE — ED Provider Notes (Signed)
CSN: [id number]     Arrival date & time 07/21/13  1952 History   First MD Initiated Contact with Patient 07/21/13 2016     Chief Complaint  Patient presents with  . Shortness of Breath     (Consider location/radiation/quality/duration/timing/severity/associated sxs/prior Treatment) HPI Comments: Pt is a 63 y/o female with a PMHx of HTN and CHF who presents to the ED complaining of shortness of breath, worse on exertion x 3 days, worsening today. Admits to lower extremity edema, however states this is chronic and better than normal. Denies chest pain, cough, wheezing, abdominal pain, nausea or vomiting, headache, weakness. She does not use oxygen at home. No recent medication changes.  Patient is a 63 y.o. female presenting with shortness of breath. The history is provided by the patient.  Shortness of Breath   Past Medical History  Diagnosis Date  . CHF (congestive heart failure)   . Hypertension    Past Surgical History  Procedure Laterality Date  . Cholecystectomy    . Abdominal hysterectomy    . Tonsillectomy    . Tubal ligation     Family History  Problem Relation Age of Onset  . Diabetes Mother   . Heart failure Mother   . Kidney failure Mother   . Kidney failure Sister   . Diabetes Brother    History  Substance Use Topics  . Smoking status: Never Smoker   . Smokeless tobacco: Not on file  . Alcohol Use: No   OB History   Grav Para Term Preterm Abortions TAB SAB Ect Mult Living                 Review of Systems  Respiratory: Positive for shortness of breath.   Cardiovascular: Positive for leg swelling.  All other systems reviewed and are negative.      Allergies  Review of patient's allergies indicates no known allergies.  Home Medications   Current Outpatient Rx  Name  Route  Sig  Dispense  Refill  . hydrALAZINE (APRESOLINE) 50 MG tablet   Oral   Take 50 mg by mouth 2 (two) times daily.         Marland Kitchen lisinopril-hydrochlorothiazide  (PRINZIDE,ZESTORETIC) 20-25 MG per tablet   Oral   Take 1 tablet by mouth daily.         . metoprolol (LOPRESSOR) 50 MG tablet   Oral   Take 50 mg by mouth 2 (two) times daily.          BP 169/86  Pulse 61  Temp(Src) 98.5 F (36.9 C) (Oral)  Resp 20  Ht 5\' 6"  (1.676 m)  Wt 304 lb 6 oz (138.064 kg)  BMI 49.15 kg/m2  SpO2 93% Physical Exam  Nursing note and vitals reviewed. Constitutional: She is oriented to person, place, and time. She appears well-developed and well-nourished. No distress.  HENT:  Head: Normocephalic and atraumatic.  Mouth/Throat: Oropharynx is clear and moist.  Eyes: Conjunctivae and EOM are normal. Pupils are equal, round, and reactive to light.  Neck: Normal range of motion. Neck supple. No JVD present.  Cardiovascular: Normal rate, regular rhythm, normal heart sounds and intact distal pulses.   +1 pitting edema LE bilateral. Equal distal pulses.  Pulmonary/Chest: Effort normal. No respiratory distress.  Breath sounds diminished at bases bilateral.  Abdominal: Soft. Bowel sounds are normal. There is no tenderness.  Musculoskeletal: Normal range of motion. She exhibits no edema.  Neurological: She is alert and oriented to person, place, and time. She  has normal strength. No sensory deficit.  Moves limbs without ataxia. Speech fluent, goal oriented. Equal grip strength.  Skin: Skin is warm and dry. She is not diaphoretic.  Psychiatric: She has a normal mood and affect. Her behavior is normal.    ED Course  Procedures (including critical care time) Labs Review Labs Reviewed  CBC - Abnormal; Notable for the following:    RBC 5.65 (*)    HCT 47.9 (*)    All other components within normal limits  BASIC METABOLIC PANEL - Abnormal; Notable for the following:    Glucose, Bld 120 (*)    GFR calc non Af Amer 88 (*)    All other components within normal limits  PRO B NATRIURETIC PEPTIDE - Abnormal; Notable for the following:    Pro B Natriuretic  peptide (BNP) 733.6 (*)    All other components within normal limits  Randolm Idol, ED   Imaging Review Dg Chest 2 View  07/21/2013   CLINICAL DATA:  Shortness of breath.  EXAM: CHEST  2 VIEW  COMPARISON:  None.  FINDINGS: Cardiomegaly noted with pulmonary vascular congestion.  Mild interstitial pulmonary edema may be present.  There is no evidence of focal airspace disease, suspicious pulmonary nodule/mass, pleural effusion, or pneumothorax. No acute bony abnormalities are identified.  IMPRESSION: Cardiomegaly with pulmonary vascular congestion possible minimal interstitial pulmonary edema.   Electronically Signed   By: Hassan Rowan M.D.   On: 07/21/2013 21:35    EKG Interpretation    Date/Time:  Sunday July 21 2013 21:00:51 EST Ventricular Rate:  63 PR Interval:  215 QRS Duration: 94 QT Interval:  432 QTC Calculation: 442 R Axis:   32 Text Interpretation:  Sinus rhythm Borderline prolonged PR interval Baseline wander in lead(s) II III aVF V3 V4 No old tracing to compare Confirmed by BELFI  MD, MELANIE ([id number]) on 07/21/2013 11:13:50 PM            MDM   Final diagnoses:  Acute exacerbation of CHF (congestive heart failure)   Pt presenting with sob, worse on exertion. O2 sat on arrival 88% on RA, increased to 96% on 3L. No respiratory distress. Afebrile. No associated chest pain. Possible CHF exacerbation. Labs, CXR pending.  CXR showing cardiomegaly with pulmonary vascular congestion, possible minimal interstitial pulmonary edema. BNP 733.6. 40 mg IV lasix given. Pt ambulated and pulse-ox after ambulating while laying 87-89% on RA, improves to 96% on 3L. Pt will be admitted for CHF exacerbation. Admission accepted by Dr. Hal Hope, [facility]. Case discussed with attending Dr. Tamera Punt who agrees with plan of care.   Illene Labrador, PA-C 07/21/13 2349

## 2013-07-21 NOTE — ED Notes (Signed)
Pt states she has been short of breath esp on exertion for the past 3 to 4 days  Pt states it is worse today

## 2013-07-22 ENCOUNTER — Encounter (HOSPITAL_COMMUNITY): Payer: Self-pay | Admitting: Internal Medicine

## 2013-07-22 DIAGNOSIS — I517 Cardiomegaly: Secondary | ICD-10-CM

## 2013-07-22 DIAGNOSIS — I509 Heart failure, unspecified: Secondary | ICD-10-CM

## 2013-07-22 DIAGNOSIS — I10 Essential (primary) hypertension: Secondary | ICD-10-CM | POA: Diagnosis present

## 2013-07-22 DIAGNOSIS — R609 Edema, unspecified: Secondary | ICD-10-CM

## 2013-07-22 LAB — COMPREHENSIVE METABOLIC PANEL
ALBUMIN: 3.3 g/dL — AB (ref 3.5–5.2)
ALT: 21 U/L (ref 0–35)
AST: 20 U/L (ref 0–37)
Alkaline Phosphatase: 61 U/L (ref 39–117)
BUN: 8 mg/dL (ref 6–23)
CALCIUM: 9.3 mg/dL (ref 8.4–10.5)
CO2: 31 mEq/L (ref 19–32)
Chloride: 98 mEq/L (ref 96–112)
Creatinine, Ser: 0.77 mg/dL (ref 0.50–1.10)
GFR calc non Af Amer: 88 mL/min — ABNORMAL LOW (ref >90)
Glucose, Bld: 121 mg/dL — ABNORMAL HIGH (ref 70–99)
Potassium: 3.5 mEq/L — ABNORMAL LOW (ref 3.7–5.3)
Sodium: 141 mEq/L (ref 137–147)
TOTAL PROTEIN: 8.6 g/dL — AB (ref 6.0–8.3)
Total Bilirubin: 0.7 mg/dL (ref 0.3–1.2)

## 2013-07-22 LAB — MAGNESIUM: MAGNESIUM: 1.9 mg/dL (ref 1.5–2.5)

## 2013-07-22 LAB — TROPONIN I

## 2013-07-22 LAB — CBC
HEMATOCRIT: 49.1 % — AB (ref 36.0–46.0)
Hemoglobin: 15.9 g/dL — ABNORMAL HIGH (ref 12.0–15.0)
MCH: 27.4 pg (ref 26.0–34.0)
MCHC: 32.4 g/dL (ref 30.0–36.0)
MCV: 84.5 fL (ref 78.0–100.0)
Platelets: 160 10*3/uL (ref 150–400)
RBC: 5.81 MIL/uL — AB (ref 3.87–5.11)
RDW: 14.5 % (ref 11.5–15.5)
WBC: 4.5 10*3/uL (ref 4.0–10.5)

## 2013-07-22 LAB — TSH: TSH: 0.854 u[IU]/mL (ref 0.350–4.500)

## 2013-07-22 MED ORDER — HYDROCHLOROTHIAZIDE 25 MG PO TABS
25.0000 mg | ORAL_TABLET | Freq: Every day | ORAL | Status: DC
  Administered 2013-07-22 – 2013-07-26 (×5): 25 mg via ORAL
  Filled 2013-07-22 (×5): qty 1

## 2013-07-22 MED ORDER — METOPROLOL TARTRATE 50 MG PO TABS
50.0000 mg | ORAL_TABLET | Freq: Two times a day (BID) | ORAL | Status: DC
  Administered 2013-07-22 – 2013-07-26 (×9): 50 mg via ORAL
  Filled 2013-07-22 (×10): qty 1

## 2013-07-22 MED ORDER — INFLUENZA VAC SPLIT QUAD 0.5 ML IM SUSP
0.5000 mL | INTRAMUSCULAR | Status: AC
  Administered 2013-07-23: 0.5 mL via INTRAMUSCULAR
  Filled 2013-07-22 (×2): qty 0.5

## 2013-07-22 MED ORDER — SODIUM CHLORIDE 0.9 % IJ SOLN
3.0000 mL | Freq: Two times a day (BID) | INTRAMUSCULAR | Status: DC
  Administered 2013-07-22 – 2013-07-25 (×8): 3 mL via INTRAVENOUS

## 2013-07-22 MED ORDER — LISINOPRIL 20 MG PO TABS
20.0000 mg | ORAL_TABLET | Freq: Every day | ORAL | Status: DC
  Administered 2013-07-22 – 2013-07-26 (×5): 20 mg via ORAL
  Filled 2013-07-22 (×5): qty 1

## 2013-07-22 MED ORDER — ACETAMINOPHEN 325 MG PO TABS
650.0000 mg | ORAL_TABLET | Freq: Four times a day (QID) | ORAL | Status: DC | PRN
  Administered 2013-07-22: 650 mg via ORAL
  Filled 2013-07-22: qty 2

## 2013-07-22 MED ORDER — ENOXAPARIN SODIUM 40 MG/0.4ML ~~LOC~~ SOLN
40.0000 mg | SUBCUTANEOUS | Status: DC
Start: 2013-07-22 — End: 2013-07-26
  Administered 2013-07-22 – 2013-07-25 (×4): 40 mg via SUBCUTANEOUS
  Filled 2013-07-22 (×5): qty 0.4

## 2013-07-22 MED ORDER — ONDANSETRON HCL 4 MG/2ML IJ SOLN: 4.0000 mg | Freq: Four times a day (QID) | INTRAMUSCULAR | Status: DC | PRN

## 2013-07-22 MED ORDER — FUROSEMIDE 10 MG/ML IJ SOLN
40.0000 mg | Freq: Three times a day (TID) | INTRAMUSCULAR | Status: DC
  Administered 2013-07-22 – 2013-07-23 (×4): 40 mg via INTRAVENOUS
  Filled 2013-07-22 (×7): qty 4

## 2013-07-22 MED ORDER — HYDRALAZINE HCL 50 MG PO TABS
50.0000 mg | ORAL_TABLET | Freq: Two times a day (BID) | ORAL | Status: DC
  Administered 2013-07-22 – 2013-07-26 (×9): 50 mg via ORAL
  Filled 2013-07-22 (×11): qty 1

## 2013-07-22 MED ORDER — ACETAMINOPHEN 650 MG RE SUPP: 650.0000 mg | Freq: Four times a day (QID) | RECTAL | Status: DC | PRN

## 2013-07-22 MED ORDER — LISINOPRIL-HYDROCHLOROTHIAZIDE 20-25 MG PO TABS: 1.0000 | ORAL_TABLET | Freq: Every day | ORAL | Status: DC

## 2013-07-22 MED ORDER — BIOTENE DRY MOUTH MT LIQD
15.0000 mL | Freq: Two times a day (BID) | OROMUCOSAL | Status: DC
  Administered 2013-07-22 – 2013-07-26 (×7): 15 mL via OROMUCOSAL

## 2013-07-22 MED ORDER — ASPIRIN EC 81 MG PO TBEC
81.0000 mg | DELAYED_RELEASE_TABLET | Freq: Every day | ORAL | Status: DC
  Administered 2013-07-22 – 2013-07-26 (×5): 81 mg via ORAL
  Filled 2013-07-22 (×5): qty 1

## 2013-07-22 MED ORDER — PNEUMOCOCCAL VAC POLYVALENT 25 MCG/0.5ML IJ INJ
0.5000 mL | INJECTION | INTRAMUSCULAR | Status: AC
  Administered 2013-07-23: 0.5 mL via INTRAMUSCULAR
  Filled 2013-07-22 (×2): qty 0.5

## 2013-07-22 MED ORDER — ONDANSETRON HCL 4 MG PO TABS: 4.0000 mg | ORAL_TABLET | Freq: Four times a day (QID) | ORAL | Status: DC | PRN

## 2013-07-22 MED ORDER — SODIUM CHLORIDE 0.9 % IJ SOLN
3.0000 mL | Freq: Two times a day (BID) | INTRAMUSCULAR | Status: DC
Start: 2013-07-22 — End: 2013-07-26
  Administered 2013-07-22 – 2013-07-25 (×2): 3 mL via INTRAVENOUS

## 2013-07-22 NOTE — Progress Notes (Signed)
Echo Lab  2D Echocardiogram completed.  [place], RDCS 07/22/2013 9:42 AM

## 2013-07-22 NOTE — Progress Notes (Signed)
Patient seen and examined, admitted by Dr. Hal Hope this morning.  Briefly 63 year old female, morbidly obese with history of hypertension, MGUS presented to the ER due to worsening shortness of breath and symptoms consistent with CHF.  BP 157/79  Pulse 71  Temp(Src) 98.4 F (36.9 C) (Oral)  Resp 18  Ht 5\' 6"  (1.676 m)  Wt 134.6 kg (296 lb 11.8 oz)  BMI 47.92 kg/m2  SpO2 96%  On exam alert and oriented x3, on 3 L O2 via nasal cannula CVS S1-S2 clear Chest: Decreased breath sound at the bases Abdomen: Morbidly obese, nontender normal bowel sounds  Extremities: 1+ edema bilaterally  Labs reviewed  Acute CHF: - Will continue IV Lasix every 8 hours, I/O's, and fluid restriction and low salt diet - BMET daily - Doppler ultrasound of the lower extremities are negative for any DVT - 2-D echo pending  Discussed in detail with the patient and family member in the room    Shann Lewellyn M.D. [facility] 07/22/2013, 2:27 PM  Pager: [phone]

## 2013-07-22 NOTE — H&P (Signed)
[facility] History and Physical  Michaela Baker LFY:[id number] DOB: 1950/11/10 DOA: 07/21/2013  Referring physician: ER physician. PCP: No primary provider on file. [facility].  Chief Complaint: Shortness of breath.  HPI: Michaela Baker is a 63 y.o. female with history of hypertension and MGUS presents to the ER because of worsening shortness of breath. Patient has been having shortness of breath over the last 3-4 days. Patient's shortness of breath is usually exertional. Denies any fever chills or any cough or chest pain. Patient has been noticing increasing lower extremity edema over the last few months. In the ER chest x-ray shows features consistent with CHF and proBNP was elevated. Patient was given one dose of IV Lasix 40 mg and admitted for further workup for CHF. Patient otherwise denies any nausea vomiting abdominal pain diarrhea focal deficits or headache. Patient states she has not had any cardiac workup previously.  Review of Systems: As presented in the history of presenting illness, rest negative.  Past Medical History  Diagnosis Date  . CHF (congestive heart failure)   . Hypertension    Past Surgical History  Procedure Laterality Date  . Cholecystectomy    . Abdominal hysterectomy    . Tonsillectomy    . Tubal ligation     Social History:  reports that she has never smoked. She does not have any smokeless tobacco history on file. She reports that she does not drink alcohol or use illicit drugs. Where does patient live home. Can patient participate in ADLs? Yes.  No Known Allergies  Family History:  Family History  Problem Relation Age of Onset  . Diabetes Mother   . Heart failure Mother   . Kidney failure Mother   . Kidney failure Sister   . Diabetes Brother       Prior to Admission medications   Medication Sig Start Date End Date Taking? Authorizing Provider  hydrALAZINE (APRESOLINE) 50 MG tablet Take 50 mg by mouth 2 (two) times daily.   Yes  Historical Provider, MD  lisinopril-hydrochlorothiazide (PRINZIDE,ZESTORETIC) 20-25 MG per tablet Take 1 tablet by mouth daily.   Yes Historical Provider, MD  metoprolol (LOPRESSOR) 50 MG tablet Take 50 mg by mouth 2 (two) times daily.   Yes Historical Provider, MD    Physical Exam: Filed Vitals:   07/21/13 2224 07/21/13 2224 07/21/13 2326 07/22/13 0054  BP: 187/81  169/86 176/75  Pulse: 67  61 58  Temp:    97.9 F (36.6 C)  TempSrc:    Oral  Resp: 19  20 20   Height:    5\' 6"  (1.676 m)  Weight:    136.5 kg (300 lb 14.9 oz)  SpO2: 88% 93% 93% 97%     General:  Well-developed and nourished.  Eyes: Anicteric no pallor.  ENT: No discharge from the ears eyes nose or mouth.  Neck: JVD not appreciated.  Cardiovascular: S1-S2 heard.  Respiratory: No rhonchi or crepitations.  Abdomen: Soft nontender bowel sounds present. No guarding or rigidity.  Skin: No rash.  Musculoskeletal: Bilateral lower extremity edema.  Psychiatric: Appears normal.  Neurologic: Alert awake oriented to time place and person. Moves all extremities.  Labs on Admission:  Basic Metabolic Panel:  Recent Labs Lab 07/21/13 2100  NA 141  K 3.8  CL 100  CO2 30  GLUCOSE 120*  BUN 9  CREATININE 0.76  CALCIUM 8.9   Liver Function Tests: No results found for this basename: AST, ALT, ALKPHOS, BILITOT, PROT, ALBUMIN,  in the last  168 hours No results found for this basename: LIPASE, AMYLASE,  in the last 168 hours No results found for this basename: AMMONIA,  in the last 168 hours CBC:  Recent Labs Lab 07/21/13 2100  WBC 4.7  HGB 14.9  HCT 47.9*  MCV 84.8  PLT 161   Cardiac Enzymes: No results found for this basename: CKTOTAL, CKMB, CKMBINDEX, TROPONINI,  in the last 168 hours  BNP (last 3 results)  Recent Labs  07/21/13 2100  PROBNP 733.6*   CBG: No results found for this basename: GLUCAP,  in the last 168 hours  Radiological Exams on Admission: Dg Chest 2 View  07/21/2013    CLINICAL DATA:  Shortness of breath.  EXAM: CHEST  2 VIEW  COMPARISON:  None.  FINDINGS: Cardiomegaly noted with pulmonary vascular congestion.  Mild interstitial pulmonary edema may be present.  There is no evidence of focal airspace disease, suspicious pulmonary nodule/mass, pleural effusion, or pneumothorax. No acute bony abnormalities are identified.  IMPRESSION: Cardiomegaly with pulmonary vascular congestion possible minimal interstitial pulmonary edema.   Electronically Signed   By: Hassan Rowan M.D.   On: 07/21/2013 21:35    EKG: Independently reviewed. Normal sinus rhythm with borderline prolonged PR interval.  Assessment/Plan Principal Problem:   CHF (congestive heart failure) Active Problems:   HTN (hypertension)   1. CHF - patient's symptoms are consistent with CHF. Patient has received one dose of IV Lasix 40 mg. I have placed patient on Lasix 40 mg IV every 8 hourly. Closely follow intake output and metabolic panel. Check 2-D echo. Since patient has significant lower extremity edema I also ordered Doppler of the lower extremity to rule out any DVT. 2. Hypertension - continue present medications including lisinopril HCTZ. 3. History of MGUS.  I have personally reviewed patient's chest x-ray and old charts and labs.  Code Status: Full code.  Family Communication: Patient's family at the bedside.  Disposition Plan: Admit to inpatient.    KAKRAKANDY,ARSHAD N. [facility] Pager [phone].  If 7PM-7AM, please contact night-coverage [email or web address] Password [email or web address] 07/22/2013, 1:02 AM

## 2013-07-22 NOTE — Progress Notes (Addendum)
*  Preliminary Results* Bilateral lower extremity venous duplex completed. Study was technically limited due to patient body habitus and edema. Visualized veins of bilateral lower extremities are negative for deep vein thrombosis. There is evidence of a left Baker's cyst. No right Baker's cyst.  07/22/2013  Maudry Mayhew, RVT, RDCS, RDMS

## 2013-07-22 NOTE — ED Notes (Signed)
MD at bedside. 

## 2013-07-23 MED ORDER — POTASSIUM CHLORIDE CRYS ER 20 MEQ PO TBCR
20.0000 meq | EXTENDED_RELEASE_TABLET | Freq: Every day | ORAL | Status: DC
  Administered 2013-07-23 – 2013-07-26 (×4): 20 meq via ORAL
  Filled 2013-07-23 (×4): qty 1

## 2013-07-23 MED ORDER — FUROSEMIDE 10 MG/ML IJ SOLN
40.0000 mg | Freq: Two times a day (BID) | INTRAMUSCULAR | Status: DC
  Administered 2013-07-23 – 2013-07-25 (×3): 40 mg via INTRAVENOUS
  Filled 2013-07-23 (×4): qty 4

## 2013-07-23 NOTE — Progress Notes (Signed)
SATURATION QUALIFICATIONS: (This note is used to comply with regulatory documentation for home oxygen)  Patient Saturations on 2 Liters of oxygen at Rest = 95%  Patient Saturations on Room Air while Ambulating = 85%  Patient Saturations on 2 Liters of oxygen while Ambulating = 91%  Please briefly explain why patient needs home oxygen:

## 2013-07-23 NOTE — Progress Notes (Signed)
[facility] PROGRESS NOTE  Michaela Baker ZHY:[id number] DOB: 05/06/1951 DOA: 07/21/2013 PCP: No primary provider on file.  Assessment/Plan: 1. Acute Diastolic CHF -improving -ECHo with EF of 60-65% and Grade 1 diastolic dysfunction -weight down >6kg -negative 5.6L -continue lasix, cut down dose to Q12 -Dc tele, no events thus far  2. HTN -stable -continue ACE, hydralazine, HCTZ and metoprolol  3. H/o MGUS -stable  DVT proph: lovenox  Ambulate, Wean O2, Dc tele  Code Status: Full Code Family Communication: d/w pt and spouse at bedside Disposition Plan: home soon    HPI/Subjective: Breathing better, swelling down  Objective: Filed Vitals:   07/23/13 0540  BP: 148/63  Pulse: 52  Temp: 98.2 F (36.8 C)  Resp: 20    Intake/Output Summary (Last 24 hours) at 07/23/13 0936 Last data filed at 07/23/13 0300  Gross per 24 hour  Intake   1083 ml  Output   5100 ml  Net  -4017 ml   Filed Weights   07/22/13 0054 07/22/13 0630 07/23/13 0540  Weight: 136.5 kg (300 lb 14.9 oz) 134.6 kg (296 lb 11.8 oz) 131.7 kg (290 lb 5.5 oz)    Exam: On exam alert and oriented x3, on 3 L O2 via nasal cannula  CVS S1-S2 no m/r/g Chest: Decreased breath sound at the bases  Abdomen: Morbidly obese, nontender normal bowel sounds  Extremities: trace edema bilaterally     Data Reviewed: Basic Metabolic Panel:  Recent Labs Lab 07/21/13 2100 07/22/13 0210  NA 141 141  K 3.8 3.5*  CL 100 98  CO2 30 31  GLUCOSE 120* 121*  BUN 9 8  CREATININE 0.76 0.77  CALCIUM 8.9 9.3  MG  --  1.9   Liver Function Tests:  Recent Labs Lab 07/22/13 0210  AST 20  ALT 21  ALKPHOS 61  BILITOT 0.7  PROT 8.6*  ALBUMIN 3.3*   No results found for this basename: LIPASE, AMYLASE,  in the last 168 hours No results found for this basename: AMMONIA,  in the last 168 hours CBC:  Recent Labs Lab 07/21/13 2100 07/22/13 0210  WBC 4.7 4.5  HGB 14.9 15.9*  HCT 47.9* 49.1*  MCV 84.8 84.5   PLT 161 160   Cardiac Enzymes:  Recent Labs Lab 07/22/13 0210  TROPONINI <0.30   BNP (last 3 results)  Recent Labs  07/21/13 2100  PROBNP 733.6*   CBG: No results found for this basename: GLUCAP,  in the last 168 hours  No results found for this or any previous visit (from the past 240 hour(s)).   Studies: Dg Chest 2 View  07/21/2013   CLINICAL DATA:  Shortness of breath.  EXAM: CHEST  2 VIEW  COMPARISON:  None.  FINDINGS: Cardiomegaly noted with pulmonary vascular congestion.  Mild interstitial pulmonary edema may be present.  There is no evidence of focal airspace disease, suspicious pulmonary nodule/mass, pleural effusion, or pneumothorax. No acute bony abnormalities are identified.  IMPRESSION: Cardiomegaly with pulmonary vascular congestion possible minimal interstitial pulmonary edema.   Electronically Signed   By: Hassan Rowan M.D.   On: 07/21/2013 21:35    Scheduled Meds: . antiseptic oral rinse  15 mL Mouth Rinse BID  . aspirin EC  81 mg Oral Daily  . enoxaparin (LOVENOX) injection  40 mg Subcutaneous Q24H  . furosemide  40 mg Intravenous Q12H  . hydrALAZINE  50 mg Oral BID  . hydrochlorothiazide  25 mg Oral Daily  . lisinopril  20 mg Oral Daily  .  metoprolol  50 mg Oral BID  . potassium chloride  20 mEq Oral Daily  . sodium chloride  3 mL Intravenous Q12H  . sodium chloride  3 mL Intravenous Q12H   Continuous Infusions:   Principal Problem:   CHF (congestive heart failure) Active Problems:   HTN (hypertension)    Time spent:55min    [facility]  [facility] Pager [phone]. If 7PM-7AM, please contact night-coverage at [email or web address], password [facility] 07/23/2013, 9:36 AM  LOS: 2 days

## 2013-07-23 NOTE — Clinical Documentation Improvement (Signed)
Noted 07/22/13 Progr note.Michaela KitchenMarland Baker"Briefly 63 year old female, morbidly obese with history of hypertension, MGUS presented to the ER due to worsening shortness of breath and symptoms consistent with CHF.".Michaela KitchenMarland Baker"Acute CHF: - Will continue IV Lasix every 8 hours, I/O's, and fluid restriction and low salt diet - BMET daily - Doppler ultrasound of the lower extremities are negative for any DVT - 2-D echo pending"...  For accurate Dx specificity & severity can noted "Acute CHF" be further specified w/ type/ acuity. Thank you  Possible Clinical Conditions? Chronic Systolic or Diastolic Congestive Heart Failure Chronic Systolic & Diastolic Congestive Heart Failure Acute Systolic or Diastolic Congestive Heart Failure Acute Systolic & Diastolic Congestive Heart Failure Acute on Chronic Systolic or Diastolic Congestive Heart Failure Acute on Chronic Systolic & Diastolic Congestive Heart Failure Other Condition Cannot Clinically Determine  Supporting Information: Risk Factors: See above note Signs & Symptoms: See above note Diagnostics: See above note Treatment: See above note  Thank You, Ermelinda Das, RN, Certified Clinical Documentation Specialist:  [employer]

## 2013-07-24 LAB — BASIC METABOLIC PANEL
BUN: 15 mg/dL (ref 6–23)
CALCIUM: 9.2 mg/dL (ref 8.4–10.5)
CO2: 33 mEq/L — ABNORMAL HIGH (ref 19–32)
Chloride: 94 mEq/L — ABNORMAL LOW (ref 96–112)
Creatinine, Ser: 0.75 mg/dL (ref 0.50–1.10)
GFR calc Af Amer: >90 mL/min (ref >90)
GFR calc non Af Amer: 88 mL/min — ABNORMAL LOW (ref >90)
GLUCOSE: 124 mg/dL — AB (ref 70–99)
Potassium: 3.7 mEq/L (ref 3.7–5.3)
Sodium: 139 mEq/L (ref 137–147)

## 2013-07-24 MED ORDER — POTASSIUM CHLORIDE CRYS ER 10 MEQ PO TBCR
30.0000 meq | EXTENDED_RELEASE_TABLET | Freq: Once | ORAL | Status: AC
  Administered 2013-07-24: 30 meq via ORAL
  Filled 2013-07-24: qty 1

## 2013-07-24 MED ORDER — METOLAZONE 2.5 MG PO TABS
2.5000 mg | ORAL_TABLET | Freq: Once | ORAL | Status: AC
  Administered 2013-07-24: 2.5 mg via ORAL
  Filled 2013-07-24: qty 1

## 2013-07-24 NOTE — Progress Notes (Signed)
PROGRESS NOTE  Michaela Baker YQM:[id number] DOB: Oct 20, 1950 DOA: 07/21/2013 PCP: No primary provider on file.  Assessment/Plan: Acute Diastolic CHF - improving, continuing to need IV diuresis for today. Add metolazone.  - ECHo with EF of 60-65% and Grade 1 diastolic dysfunction  - Dc tele, no events thus far  - poor insight HTN  -stable  -continue ACE, hydralazine, HCTZ and metoprolol  H/o MGUS  -stable  Diet: heart Fluids: none DVT Prophylaxis: Lovenox  Code Status: Full Family Communication: family bedside  Disposition Plan: inpatient  Consultants:  none  Procedures:  2D echo   Antibiotics  Anti-infectives   None     Antibiotics Given (last 72 hours)   None      HPI/Subjective: - feeling much better  Objective: Filed Vitals:   07/23/13 1439 07/23/13 2027 07/24/13 0516 07/24/13 1115  BP: 122/57 133/84 140/57   Pulse: 70 74 59   Temp: 98.1 F (36.7 C) 97.8 F (36.6 C) 98.4 F (36.9 C)   TempSrc: Oral Oral Oral   Resp: 18 18 18    Height:      Weight:   131.9 kg (290 lb 12.6 oz)   SpO2: 96% 96% 96% 99%    Intake/Output Summary (Last 24 hours) at 07/24/13 1310 Last data filed at 07/24/13 0700  Gross per 24 hour  Intake    720 ml  Output   1500 ml  Net   -780 ml   Filed Weights   07/22/13 0630 07/23/13 0540 07/24/13 0516  Weight: 134.6 kg (296 lb 11.8 oz) 131.7 kg (290 lb 5.5 oz) 131.9 kg (290 lb 12.6 oz)    Exam:   General:  NAD  Cardiovascular: regular rate and rhythm, without MRG  Respiratory: good air movement, clear to auscultation throughout, no wheezing, ronchi or rales  Abdomen: soft, not tender to palpation, positive bowel sounds  MSK: 1-2+ peripheral edema  Neuro: non focal  Data Reviewed: Basic Metabolic Panel:  Recent Labs Lab 07/21/13 2100 07/22/13 0210 07/24/13 0443  NA 141 141 139  K 3.8 3.5* 3.7  CL 100 98 94*  CO2 30 31 33*  GLUCOSE 120* 121* 124*  BUN 9 8 15   CREATININE 0.76 0.77 0.75  CALCIUM 8.9  9.3 9.2  MG  --  1.9  --    Liver Function Tests:  Recent Labs Lab 07/22/13 0210  AST 20  ALT 21  ALKPHOS 61  BILITOT 0.7  PROT 8.6*  ALBUMIN 3.3*   CBC:  Recent Labs Lab 07/21/13 2100 07/22/13 0210  WBC 4.7 4.5  HGB 14.9 15.9*  HCT 47.9* 49.1*  MCV 84.8 84.5  PLT 161 160   Cardiac Enzymes:  Recent Labs Lab 07/22/13 0210  TROPONINI <0.30   BNP (last 3 results)  Recent Labs  07/21/13 2100  PROBNP 733.6*   Studies: No results found.  Scheduled Meds: . antiseptic oral rinse  15 mL Mouth Rinse BID  . aspirin EC  81 mg Oral Daily  . enoxaparin (LOVENOX) injection  40 mg Subcutaneous Q24H  . furosemide  40 mg Intravenous Q12H  . hydrALAZINE  50 mg Oral BID  . hydrochlorothiazide  25 mg Oral Daily  . lisinopril  20 mg Oral Daily  . metolazone  2.5 mg Oral Once  . metoprolol  50 mg Oral BID  . potassium chloride  30 mEq Oral Once  . potassium chloride  20 mEq Oral Daily  . sodium chloride  3 mL Intravenous Q12H  . sodium  chloride  3 mL Intravenous Q12H   Continuous Infusions:   Principal Problem:   CHF (congestive heart failure) Active Problems:   HTN (hypertension)  Time spent: 35  This note has been created with Surveyor, quantity. Any transcriptional errors are unintentional.   Marzetta Board, MD [facility] Pager [phone]. If 7 PM - 7 AM, please contact night-coverage at [email or web address], password [facility] 07/24/2013, 1:10 PM  LOS: 3 days

## 2013-07-24 NOTE — Progress Notes (Signed)
Patient ambulated a lap around the unit tonight without oxygen. She did not complain of any shortness of breath. Patient stayed from 91-94% 3/4 of the walk and then started to desat to 96%. Patient put back on 2L [place] once in the room. Will continue to monitor.

## 2013-07-24 NOTE — Progress Notes (Signed)
Patient ambulated around entire unit on RA to check oxygen saturation because patient stated MD wanted it rechecked on RA.  Patient did almost entire lap of 4W with oxygen sats ranging from 91-100%.  Patient was approximately 5 rooms away from her room where her sats dropped to 86-89% for ~10 seconds and then went up to 94%. Patient denied any SOB.  When patient returned to room, oxygen was applied at 2 L/M [place].  Will continue to monitor.

## 2013-07-24 NOTE — Plan of Care (Signed)
Problem: Phase II Progression Outcomes Goal: Walk in hall or up in chair TID Outcome: Completed/Met Date Met:  07/24/13 Able to walk well in hall on 2L of O2

## 2013-07-25 LAB — BASIC METABOLIC PANEL
BUN: 19 mg/dL (ref 6–23)
CALCIUM: 9.5 mg/dL (ref 8.4–10.5)
CO2: 29 meq/L (ref 19–32)
Chloride: 93 mEq/L — ABNORMAL LOW (ref 96–112)
Creatinine, Ser: 0.86 mg/dL (ref 0.50–1.10)
GFR calc non Af Amer: 70 mL/min — ABNORMAL LOW (ref >90)
GFR, EST AFRICAN AMERICAN: 82 mL/min — AB (ref >90)
Glucose, Bld: 134 mg/dL — ABNORMAL HIGH (ref 70–99)
Potassium: 4 mEq/L (ref 3.7–5.3)
Sodium: 134 mEq/L — ABNORMAL LOW (ref 137–147)

## 2013-07-25 LAB — CBC
HEMATOCRIT: 51.6 % — AB (ref 36.0–46.0)
Hemoglobin: 16.3 g/dL — ABNORMAL HIGH (ref 12.0–15.0)
MCH: 27.2 pg (ref 26.0–34.0)
MCHC: 31.6 g/dL (ref 30.0–36.0)
MCV: 86.1 fL (ref 78.0–100.0)
PLATELETS: 152 10*3/uL (ref 150–400)
RBC: 5.99 MIL/uL — ABNORMAL HIGH (ref 3.87–5.11)
RDW: 14.6 % (ref 11.5–15.5)
WBC: 6.2 10*3/uL (ref 4.0–10.5)

## 2013-07-25 LAB — PHOSPHORUS: Phosphorus: 3.6 mg/dL (ref 2.3–4.6)

## 2013-07-25 LAB — MAGNESIUM: MAGNESIUM: 2.1 mg/dL (ref 1.5–2.5)

## 2013-07-25 MED ORDER — FUROSEMIDE 40 MG PO TABS
40.0000 mg | ORAL_TABLET | Freq: Two times a day (BID) | ORAL | Status: DC
  Administered 2013-07-25 – 2013-07-26 (×2): 40 mg via ORAL
  Filled 2013-07-25 (×4): qty 1

## 2013-07-25 NOTE — Progress Notes (Signed)
Pt ambulated in hallway without O2, her O2 sats dropped to 88%. When pt returned to her room and sat down her O2 sats increased to 95% on room air. 0.5L reapplied to pt once back in bed. Pt denies any distress. Will continue to monitor pt.

## 2013-07-25 NOTE — Progress Notes (Signed)
PROGRESS NOTE  Michaela Baker [id number]:[id number] DOB: 02-Apr-1951 DOA: 07/21/2013 PCP: No primary provider on file.  Assessment/Plan: Acute Diastolic CHF - improving, change diuresis to oral lasix. Add metolazone.  - ECHo with EF of 60-65% and Grade 1 diastolic dysfunction  - Dc tele, no events thus far  - poor insight HTN  -stable  -continue ACE, hydralazine, HCTZ and metoprolol  H/o MGUS  -stable  Diet: heart Fluids: none DVT Prophylaxis: Lovenox  Code Status: Full Family Communication: family bedside  Disposition Plan: inpatient, home in am  Consultants:  none  Procedures:  2D echo   Antibiotics  Anti-infectives   None     Antibiotics Given (last 72 hours)   None      HPI/Subjective: - feeling much better  Objective: Filed Vitals:   07/24/13 2130 07/25/13 0545 07/25/13 0942 07/25/13 1409  BP: 138/64 151/73 128/65 122/62  Pulse: 71 57  62  Temp: 97.7 F (36.5 C) 98.2 F (36.8 C)  98 F (36.7 C)  TempSrc: Oral Oral  Oral  Resp: 22 20  20   Height:      Weight:  132 kg (291 lb 0.1 oz)    SpO2: 95% 96%  98%    Intake/Output Summary (Last 24 hours) at 07/25/13 1658 Last data filed at 07/25/13 1500  Gross per 24 hour  Intake    480 ml  Output   3150 ml  Net  -2670 ml   Filed Weights   07/23/13 0540 07/24/13 0516 07/25/13 0545  Weight: 131.7 kg (290 lb 5.5 oz) 131.9 kg (290 lb 12.6 oz) 132 kg (291 lb 0.1 oz)    Exam:   General:  NAD  Cardiovascular: regular rate and rhythm, without MRG  Respiratory: good air movement, clear to auscultation throughout, no wheezing, ronchi or rales  Abdomen: soft, not tender to palpation, positive bowel sounds  MSK: 1-2+ peripheral edema  Neuro: non focal  Data Reviewed: Basic Metabolic Panel:  Recent Labs Lab 07/21/13 2100 07/22/13 0210 07/24/13 0443 07/25/13 0345  NA 141 141 139 134*  K 3.8 3.5* 3.7 4.0  CL 100 98 94* 93*  CO2 30 31 33* 29  GLUCOSE 120* 121* 124* 134*  BUN 9 8 15 19     CREATININE 0.76 0.77 0.75 0.86  CALCIUM 8.9 9.3 9.2 9.5  MG  --  1.9  --  2.1  PHOS  --   --   --  3.6   Liver Function Tests:  Recent Labs Lab 07/22/13 0210  AST 20  ALT 21  ALKPHOS 61  BILITOT 0.7  PROT 8.6*  ALBUMIN 3.3*   CBC:  Recent Labs Lab 07/21/13 2100 07/22/13 0210 07/25/13 0345  WBC 4.7 4.5 6.2  HGB 14.9 15.9* 16.3*  HCT 47.9* 49.1* 51.6*  MCV 84.8 84.5 86.1  PLT 161 160 152   Cardiac Enzymes:  Recent Labs Lab 07/22/13 0210  TROPONINI <0.30   BNP (last 3 results)  Recent Labs  07/21/13 2100  PROBNP 733.6*   Studies: No results found.  Scheduled Meds: . antiseptic oral rinse  15 mL Mouth Rinse BID  . aspirin EC  81 mg Oral Daily  . enoxaparin (LOVENOX) injection  40 mg Subcutaneous Q24H  . furosemide  40 mg Oral BID  . hydrALAZINE  50 mg Oral BID  . hydrochlorothiazide  25 mg Oral Daily  . lisinopril  20 mg Oral Daily  . metoprolol  50 mg Oral BID  . potassium chloride  20 mEq  Oral Daily  . sodium chloride  3 mL Intravenous Q12H  . sodium chloride  3 mL Intravenous Q12H   Continuous Infusions:   Principal Problem:   CHF (congestive heart failure) Active Problems:   HTN (hypertension)  Time spent: 25  This note has been created with Surveyor, quantity. Any transcriptional errors are unintentional.   Marzetta Board, MD [facility] Pager [phone]. If 7 PM - 7 AM, please contact night-coverage at [email or web address], password [facility] 07/25/2013, 4:58 PM  LOS: 4 days

## 2013-07-25 NOTE — Progress Notes (Signed)
Before ambulation oxygen saturation 95%-RA, during ambulation oxygen saturation down to 83%-RA, applied 0.5Liter of oxygen, O2 sat between 93-93%. Patient denies any SOB/distress. Will continue to assess patient.

## 2013-07-26 DIAGNOSIS — D472 Monoclonal gammopathy: Secondary | ICD-10-CM | POA: Diagnosis not present

## 2013-07-26 LAB — BASIC METABOLIC PANEL
BUN: 26 mg/dL — AB (ref 6–23)
CHLORIDE: 94 meq/L — AB (ref 96–112)
CO2: 32 mEq/L (ref 19–32)
Calcium: 9.6 mg/dL (ref 8.4–10.5)
Creatinine, Ser: 0.94 mg/dL (ref 0.50–1.10)
GFR calc non Af Amer: 63 mL/min — ABNORMAL LOW (ref >90)
GFR, EST AFRICAN AMERICAN: 73 mL/min — AB (ref >90)
Glucose, Bld: 130 mg/dL — ABNORMAL HIGH (ref 70–99)
Potassium: 3.9 mEq/L (ref 3.7–5.3)
Sodium: 136 mEq/L — ABNORMAL LOW (ref 137–147)

## 2013-07-26 MED ORDER — FUROSEMIDE 40 MG PO TABS: 40.0000 mg | ORAL_TABLET | Freq: Every day | ORAL | Status: DC

## 2013-07-26 MED ORDER — ASPIRIN 81 MG PO TBEC: 81.0000 mg | DELAYED_RELEASE_TABLET | Freq: Every day | ORAL | Status: DC

## 2013-07-26 MED ORDER — POTASSIUM CHLORIDE CRYS ER 20 MEQ PO TBCR: 10.0000 meq | EXTENDED_RELEASE_TABLET | Freq: Every day | ORAL | Status: DC

## 2013-07-26 NOTE — Progress Notes (Signed)
SATURATION QUALIFICATIONS: (This note is used to comply with regulatory documentation for home oxygen)  Patient Saturations on Room Air at Rest = 95%  Patient Saturations on Room Air while Ambulating = 83%  Patient Saturations on .5 Liters of oxygen while Ambulating = 93%  Please briefly explain why patient needs home oxygen:  Saturations decreased per note on 2/26 at 1858.  Note entered for home oxygen coverage.

## 2013-07-26 NOTE — Discharge Summary (Signed)
Physician Discharge Summary  Brynlei Klausner [id number]:[id number] DOB: January 03, 1951 DOA: 07/21/2013  PCP: No primary provider on file.  Admit date: 07/21/2013 Discharge date: 07/26/2013  Time spent: 35 minutes  Recommendations for Outpatient Follow-up:  1. Follow up with Dr. Ouida Sills next week on 08/01/2013 at 10:30 am 2. Daily weights, low salt diet   Recommendations for primary care physician for things to follow:  Medication compliance, daily weights Referral for sleep study Weight loss management Repeat BMP for potassium levels and renal function  Discharge Diagnoses:  Principal Problem:   Acute exacerbation of CHF (congestive heart failure) Active Problems:   CHF (congestive heart failure)   HTN (hypertension)   MGUS (monoclonal gammopathy of unknown significance)  Discharge Condition: stable  Diet recommendation: heart healthy, low salt  Filed Weights   07/24/13 0516 07/25/13 0545 07/26/13 0519  Weight: 131.9 kg (290 lb 12.6 oz) 132 kg (291 lb 0.1 oz) 131.09 kg (289 lb)   History of present illness:  Michaela Baker is a 63 y.o. female with history of hypertension and MGUS presents to the ER because of worsening shortness of breath. Patient has been having shortness of breath over the last 3-4 days. Patient's shortness of breath is usually exertional. Denies any fever chills or any cough or chest pain. Patient has been noticing increasing lower extremity edema over the last few months. In the ER chest x-ray shows features consistent with CHF and proBNP was elevated. Patient was given one dose of IV Lasix 40 mg and admitted for further workup for CHF. Patient otherwise denies any nausea vomiting abdominal pain diarrhea focal deficits or headache. Patient states she has not had any cardiac workup previously.  Hospital Course:  Acute Diastolic CHF - improving, patient initially required IV diuresis. She was net negative 15 lbs and was then transitioned to oral Lasix wih continued good UOP.  She was extensively counseled regarding dietary salt intake, daily weights. She will have a follow up appointment next week and I have strongly encouraged her not to miss her follow up. ECHo with EF of 60-65% and Grade 1 diastolic dysfunction. No events on telemetry.  HTN -stable, continue regimen as per below.  H/o MGUS -stable  Procedures:  2D echo  Study Conclusions  - Left ventricle: The cavity size was normal. Wall thickness was increased in a pattern of mild LVH. Systolic function was normal. The estimated ejection fraction was in the range of 60% to 65%. Wall motion was normal; there were no regional wall motion abnormalities. Doppler parameters are consistent with abnormal left ventricular relaxation (grade 1 diastolic dysfunction). - Aortic valve: There was no stenosis. - Mitral valve: Mildly calcified annulus. Mildly calcified leaflets . Trivial regurgitation. - Left atrium: The atrium was at the upper limits of normal in size. - Right ventricle: Poorly visualized. The cavity size was normal. Systolic function was normal. - Pulmonary arteries: No complete TR doppler jet so unable to estimate PA systolic pressure. - Inferior vena cava: The vessel was normal in size; the respirophasic diameter changes were in the normal range (= 50%); findings are consistent with normal central venous pressure.   Impressions:  - Normal LV size with mild LV hypertrophy. EF 60-65%. Normal RV size and systolic function. No significant valvular abnormalities.  Consultations:  none  Discharge Exam: Filed Vitals:   07/25/13 2035 07/25/13 2134 07/25/13 2135 07/26/13 0519  BP: 111/64   122/67  Pulse: 74   72  Temp: 98.6 F (37 C)   98.2 F (  36.8 C)  TempSrc: Oral   Oral  Resp: 19   19  Height:      Weight:    131.09 kg (289 lb)  SpO2: 92% 88% 94% 92%   General: NAD Cardiovascular: RRR Respiratory: CTA biL  Discharge Instructions     Medication List         aspirin 81 MG EC tablet  Take 1  tablet (81 mg total) by mouth daily.     furosemide 40 MG tablet  Commonly known as:  LASIX  Take 1 tablet (40 mg total) by mouth daily.     hydrALAZINE 50 MG tablet  Commonly known as:  APRESOLINE  Take 50 mg by mouth 2 (two) times daily.     lisinopril-hydrochlorothiazide 20-25 MG per tablet  Commonly known as:  PRINZIDE,ZESTORETIC  Take 1 tablet by mouth daily.     metoprolol 50 MG tablet  Commonly known as:  LOPRESSOR  Take 50 mg by mouth 2 (two) times daily.     potassium chloride SA 20 MEQ tablet  Commonly known as:  K-DUR,KLOR-CON  Take 0.5 tablets (10 mEq total) by mouth daily.           Follow-up Information   Follow up with Dr. Ouida Sills in [facility] On 08/01/2013. (08/01/2013 at 10:30 am)    Contact information:   [address] [place], [place] [address] [phone]      The results of significant diagnostics from this hospitalization (including imaging, microbiology, ancillary and laboratory) are listed below for reference.    Significant Diagnostic Studies: Dg Chest 2 View  07/21/2013   CLINICAL DATA:  Shortness of breath.  EXAM: CHEST  2 VIEW  COMPARISON:  None.  FINDINGS: Cardiomegaly noted with pulmonary vascular congestion.  Mild interstitial pulmonary edema may be present.  There is no evidence of focal airspace disease, suspicious pulmonary nodule/mass, pleural effusion, or pneumothorax. No acute bony abnormalities are identified.  IMPRESSION: Cardiomegaly with pulmonary vascular congestion possible minimal interstitial pulmonary edema.   Electronically Signed   By: Hassan Rowan M.D.   On: 07/21/2013 21:35    Microbiology: No results found for this or any previous visit (from the past 240 hour(s)).   Labs: Basic Metabolic Panel:  Recent Labs Lab 07/21/13 2100 07/22/13 0210 07/24/13 0443 07/25/13 0345 07/26/13 0420  NA 141 141 139 134* 136*  K 3.8 3.5* 3.7 4.0 3.9  CL 100 98 94* 93* 94*  CO2 30 31 33* 29 32  GLUCOSE 120* 121*  124* 134* 130*  BUN 9 8 15 19  26*  CREATININE 0.76 0.77 0.75 0.86 0.94  CALCIUM 8.9 9.3 9.2 9.5 9.6  MG  --  1.9  --  2.1  --   PHOS  --   --   --  3.6  --    Liver Function Tests:  Recent Labs Lab 07/22/13 0210  AST 20  ALT 21  ALKPHOS 61  BILITOT 0.7  PROT 8.6*  ALBUMIN 3.3*   CBC:  Recent Labs Lab 07/21/13 2100 07/22/13 0210 07/25/13 0345  WBC 4.7 4.5 6.2  HGB 14.9 15.9* 16.3*  HCT 47.9* 49.1* 51.6*  MCV 84.8 84.5 86.1  PLT 161 160 152   Cardiac Enzymes:  Recent Labs Lab 07/22/13 0210  TROPONINI <0.30   BNP: BNP (last 3 results)  Recent Labs  07/21/13 2100  PROBNP 733.6*   Signed:  Marzetta Board  [facility] 07/26/2013, 3:33 PM

## 2013-07-26 NOTE — Progress Notes (Signed)
[facility] is providing the following services: oxygen  If patient discharges after hours, please call [phone].   Linward Headland 07/26/2013, 10:54 AM

## 2013-07-26 NOTE — Discharge Instructions (Signed)
You were cared for by a hospitalist during your hospital stay. If you have any questions about your discharge medications or the care you received while you were in the hospital after you are discharged, you can call the unit and asked to speak with the hospitalist on call if the hospitalist that took care of you is not available. Once you are discharged, your primary care physician will handle any further medical issues. Please note that NO REFILLS for any discharge medications will be authorized once you are discharged, as it is imperative that you return to your primary care physician (or establish a relationship with a primary care physician if you do not have one) for your aftercare needs so that they can reassess your need for medications and monitor your lab values.     If you do not have a primary care physician, you can call [phone] for a physician referral.  Follow with Primary MD Dr. Ouida Sills in [facility] on March 5th at 10:30 am   Get CBC, CMP checked by your doctor and again as further instructed.  Get a 2 view Chest X ray done next visit if you had Pneumonia of Lung problems at the [facility] reviewed and adjusted.  Please request your Prim.MD to go over all Hospital Tests and Procedure/Radiological results at the follow up, please get all Hospital records sent to your Prim MD by signing hospital release before you go home.  Activity: As tolerated with Full fall precautions use walker/cane & assistance as needed  Diet: low salt, heart healthy  For Heart failure patients - Check your Weight same time everyday, if you gain over 2 pounds, or you develop in leg swelling, experience more shortness of breath or chest pain, call your Primary MD immediately. Follow Cardiac Low Salt Diet and 1.8 lit/day fluid restriction.  Disposition Home  If you experience worsening of your admission symptoms, develop shortness of breath, life threatening emergency, suicidal or  homicidal thoughts you must seek medical attention immediately by calling 911 or calling your MD immediately  if symptoms less severe.  You Must read complete instructions/literature along with all the possible adverse reactions/side effects for all the Medicines you take and that have been prescribed to you. Take any new Medicines after you have completely understood and accpet all the possible adverse reactions/side effects.   Do not drive and provide baby sitting services if your were admitted for syncope or siezures until you have seen by Primary MD or a Neurologist and advised to do so again.  Do not drive when taking Pain medications.   Do not take more than prescribed Pain, Sleep and Anxiety Medications  Special Instructions: If you have smoked or chewed Tobacco  in the last 2 yrs please stop smoking, stop any regular Alcohol  and or any Recreational drug use.  Wear Seat belts while driving.

## 2013-10-01 ENCOUNTER — Other Ambulatory Visit: Payer: Self-pay | Admitting: Internal Medicine

## 2014-05-13 ENCOUNTER — Other Ambulatory Visit (HOSPITAL_COMMUNITY): Payer: Self-pay | Admitting: Nurse Practitioner

## 2014-05-13 DIAGNOSIS — Z1231 Encounter for screening mammogram for malignant neoplasm of breast: Secondary | ICD-10-CM

## 2014-06-06 ENCOUNTER — Ambulatory Visit (HOSPITAL_COMMUNITY)
Admission: RE | Admit: 2014-06-06 | Discharge: 2014-06-06 | Disposition: A | Discharge status: Home / Self Care | Payer: Commercial Managed Care - HMO | Source: Ambulatory Visit | Attending: Nurse Practitioner | Admitting: Nurse Practitioner

## 2014-06-06 DIAGNOSIS — Z1231 Encounter for screening mammogram for malignant neoplasm of breast: Secondary | ICD-10-CM | POA: Diagnosis present

## 2015-02-28 ENCOUNTER — Emergency Department (HOSPITAL_COMMUNITY)
Admission: EM | Admit: 2015-02-28 | Discharge: 2015-02-28 | Disposition: A | Discharge status: Home / Self Care | Payer: Medicare HMO | Attending: Emergency Medicine | Admitting: Emergency Medicine

## 2015-02-28 ENCOUNTER — Encounter (HOSPITAL_COMMUNITY): Payer: Self-pay

## 2015-02-28 DIAGNOSIS — I509 Heart failure, unspecified: Secondary | ICD-10-CM | POA: Diagnosis not present

## 2015-02-28 DIAGNOSIS — R040 Epistaxis: Secondary | ICD-10-CM | POA: Diagnosis not present

## 2015-02-28 DIAGNOSIS — Z79899 Other long term (current) drug therapy: Secondary | ICD-10-CM | POA: Diagnosis not present

## 2015-02-28 DIAGNOSIS — Z7982 Long term (current) use of aspirin: Secondary | ICD-10-CM | POA: Diagnosis not present

## 2015-02-28 DIAGNOSIS — I10 Essential (primary) hypertension: Secondary | ICD-10-CM | POA: Diagnosis not present

## 2015-02-28 LAB — CBC WITH DIFFERENTIAL/PLATELET
Basophils Absolute: 0 10*3/uL (ref 0.0–0.1)
Basophils Relative: 0 %
EOS PCT: 1 %
Eosinophils Absolute: 0 10*3/uL (ref 0.0–0.7)
HCT: 48.7 % — ABNORMAL HIGH (ref 36.0–46.0)
Hemoglobin: 15.8 g/dL — ABNORMAL HIGH (ref 12.0–15.0)
LYMPHS ABS: 1.2 10*3/uL (ref 0.7–4.0)
Lymphocytes Relative: 29 %
MCH: 27.6 pg (ref 26.0–34.0)
MCHC: 32.4 g/dL (ref 30.0–36.0)
MCV: 85 fL (ref 78.0–100.0)
MONO ABS: 0.5 10*3/uL (ref 0.1–1.0)
MONOS PCT: 13 %
NEUTROS ABS: 2.5 10*3/uL (ref 1.7–7.7)
Neutrophils Relative %: 57 %
PLATELETS: 161 10*3/uL (ref 150–400)
RBC: 5.73 MIL/uL — ABNORMAL HIGH (ref 3.87–5.11)
RDW: 14.4 % (ref 11.5–15.5)
WBC: 4.2 10*3/uL (ref 4.0–10.5)

## 2015-02-28 LAB — APTT: aPTT: 24 seconds (ref 24–37)

## 2015-02-28 MED ORDER — LISINOPRIL 20 MG PO TABS
20.0000 mg | ORAL_TABLET | Freq: Once | ORAL | Status: AC
  Administered 2015-02-28: 20 mg via ORAL
  Filled 2015-02-28: qty 1

## 2015-02-28 MED ORDER — HYDRALAZINE HCL 50 MG PO TABS
50.0000 mg | ORAL_TABLET | Freq: Once | ORAL | Status: AC
  Administered 2015-02-28: 50 mg via ORAL
  Filled 2015-02-28: qty 1

## 2015-02-28 NOTE — ED Notes (Signed)
Dr. Johnney Killian bedside.

## 2015-02-28 NOTE — Discharge Instructions (Signed)
Nosebleed Nosebleeds can be caused by many conditions, including trauma, infections, polyps, foreign bodies, dry mucous membranes or climate, medicines, and air conditioning. Most nosebleeds occur in the front of the nose. Because of this location, most nosebleeds can be controlled by pinching the nostrils gently and continuously for at least 10 to 20 minutes. The long, continuous pressure allows enough time for the blood to clot. If pressure is released during that 10 to 20 minute time period, the process may have to be started again. The nosebleed may stop by itself or quit with pressure, or it may need concentrated heating (cautery) or pressure from packing. HOME CARE INSTRUCTIONS   If your nose was packed, try to maintain the pack inside until your health care provider removes it. If a gauze pack was used and it starts to fall out, gently replace it or cut the end off. Do not cut if a balloon catheter was used to pack the nose. Otherwise, do not remove unless instructed.  Avoid blowing your nose for 12 hours after treatment. This could dislodge the pack or clot and start the bleeding again.  If the bleeding starts again, sit up and bend forward, gently pinching the front half of your nose continuously for 20 minutes.  If bleeding was caused by dry mucous membranes, use over-the-counter saline nasal spray or gel. This will keep the mucous membranes moist and allow them to heal. If you must use a lubricant, choose the water-soluble variety. Use it only sparingly and not within several hours of lying down.  Do not use petroleum jelly or mineral oil, as these may drip into the lungs and cause serious problems.  Maintain humidity in your home by using less air conditioning or by using a humidifier.  Do not use aspirin or medicines which make bleeding more likely. Your health care provider can give you recommendations on this.  Resume normal activities as you are able, but try to avoid straining,  lifting, or bending at the waist for several days.  If the nosebleeds become recurrent and the cause is unknown, your health care provider may suggest laboratory tests. SEEK MEDICAL CARE IF: You have a fever. SEEK IMMEDIATE MEDICAL CARE IF:   Bleeding recurs and cannot be controlled.  There is unusual bleeding from or bruising on other parts of the body.  Nosebleeds continue.  There is any worsening of the condition which originally brought you in.  You become light-headed, feel faint, become sweaty, or vomit blood. MAKE SURE YOU:   Understand these instructions.  Will watch your condition.  Will get help right away if you are not doing well or get worse. Document Released: 02/23/2005 Document Revised: 09/30/2013 Document Reviewed: 04/16/2009 [facility] Patient Information 2015 [place], [place]. This information is not intended to replace advice given to you by your health care provider. Make sure you discuss any questions you have with your health care provider. Hypertension Hypertension, commonly called high blood pressure, is when the force of blood pumping through your arteries is too strong. Your arteries are the blood vessels that carry blood from your heart throughout your body. A blood pressure reading consists of a higher number over a lower number, such as 110/72. The higher number (systolic) is the pressure inside your arteries when your heart pumps. The lower number (diastolic) is the pressure inside your arteries when your heart relaxes. Ideally you want your blood pressure below 120/80. Hypertension forces your heart to work harder to pump blood. Your arteries may become  narrow or stiff. Having hypertension puts you at risk for heart disease, stroke, and other problems.  RISK FACTORS Some risk factors for high blood pressure are controllable. Others are not.  Risk factors you cannot control include:   Race. You may be at higher risk if you are African American.  Age. Risk  increases with age.  Gender. Men are at higher risk than women before age 37 years. After age 55, women are at higher risk than men. Risk factors you can control include:  Not getting enough exercise or physical activity.  Being overweight.  Getting too much fat, sugar, calories, or salt in your diet.  Drinking too much alcohol. SIGNS AND SYMPTOMS Hypertension does not usually cause signs or symptoms. Extremely high blood pressure (hypertensive crisis) may cause headache, anxiety, shortness of breath, and nosebleed. DIAGNOSIS  To check if you have hypertension, your health care provider will measure your blood pressure while you are seated, with your arm held at the level of your heart. It should be measured at least twice using the same arm. Certain conditions can cause a difference in blood pressure between your right and left arms. A blood pressure reading that is higher than normal on one occasion does not mean that you need treatment. If one blood pressure reading is high, ask your health care provider about having it checked again. TREATMENT  Treating high blood pressure includes making lifestyle changes and possibly taking medicine. Living a healthy lifestyle can help lower high blood pressure. You may need to change some of your habits. Lifestyle changes may include:  Following the DASH diet. This diet is high in fruits, vegetables, and whole grains. It is low in salt, red meat, and added sugars.  Getting at least 2 hours of brisk physical activity every week.  Losing weight if necessary.  Not smoking.  Limiting alcoholic beverages.  Learning ways to reduce stress. If lifestyle changes are not enough to get your blood pressure under control, your health care provider may prescribe medicine. You may need to take more than one. Work closely with your health care provider to understand the risks and benefits. HOME CARE INSTRUCTIONS  Have your blood pressure rechecked as  directed by your health care provider.   Take medicines only as directed by your health care provider. Follow the directions carefully. Blood pressure medicines must be taken as prescribed. The medicine does not work as well when you skip doses. Skipping doses also puts you at risk for problems.   Do not smoke.   Monitor your blood pressure at home as directed by your health care provider. SEEK MEDICAL CARE IF:   You think you are having a reaction to medicines taken.  You have recurrent headaches or feel dizzy.  You have swelling in your ankles.  You have trouble with your vision. SEEK IMMEDIATE MEDICAL CARE IF:  You develop a severe headache or confusion.  You have unusual weakness, numbness, or feel faint.  You have severe chest or abdominal pain.  You vomit repeatedly.  You have trouble breathing. MAKE SURE YOU:   Understand these instructions.  Will watch your condition.  Will get help right away if you are not doing well or get worse. Document Released: 05/16/2005 Document Revised: 09/30/2013 Document Reviewed: 03/08/2013 [facility] Patient Information 2015 [place], [place]. This information is not intended to replace advice given to you by your health care provider. Make sure you discuss any questions you have with your health care provider.

## 2015-02-28 NOTE — ED Notes (Signed)
Per EMS pt has Nosebleed x 2 hours, pt woke up with blood all over her face and called ems. Upon arrival pt had steady drip from left nostril. Pt's BP was 198/110 so pt was not given afrin by EMS. Pt alert and oriented x 4, no pain. No neuro deficits. HR 88 and regular.

## 2015-02-28 NOTE — ED Notes (Signed)
Dr. Ree Kida of pressure.

## 2015-02-28 NOTE — ED Provider Notes (Signed)
CSN: [id number]     Arrival date & time 02/28/15  0508 History   First MD Initiated Contact with Patient 02/28/15 [phone]     Chief Complaint  Patient presents with  . Epistaxis     (Consider location/radiation/quality/duration/timing/severity/associated sxs/prior Treatment) HPI Patient states that she awakened with a nose bleed. She states the blood was in the back of her throat and coming out of her left nostril. There is no associated pain. Patient otherwise felt well. She was noted to be significantly hypertensive. Patient reports he usually takes her blood pressure medications in the morning. She denies any history of prior nosebleed. She reports she takes a daily aspirin but no other blood thinners. Past Medical History  Diagnosis Date  . CHF (congestive heart failure) ([place])   . Hypertension    Past Surgical History  Procedure Laterality Date  . Cholecystectomy    . Abdominal hysterectomy    . Tonsillectomy    . Tubal ligation     Family History  Problem Relation Age of Onset  . Diabetes Mother   . Heart failure Mother   . Kidney failure Mother   . Kidney failure Sister   . Diabetes Brother    Social History  Substance Use Topics  . Smoking status: Never Smoker   . Smokeless tobacco: None  . Alcohol Use: No   OB History    No data available     Review of Systems Constitutional: No recent fever chills Cardiovascular: No chest pain shortness of breath or cough. Neurologic: No headache incoordination or confusion.  Allergies  Review of patient's allergies indicates no known allergies.  Home Medications   Prior to Admission medications   Medication Sig Start Date End Date Taking? Authorizing Provider  aspirin EC 81 MG EC tablet Take 1 tablet (81 mg total) by mouth daily. 07/26/13  Yes Costin Karlyne Greenspan, MD  furosemide (LASIX) 40 MG tablet Take 1 tablet (40 mg total) by mouth daily. 07/26/13  Yes Costin Karlyne Greenspan, MD  hydrALAZINE (APRESOLINE) 50 MG tablet Take 50 mg  by mouth 2 (two) times daily.   Yes Historical Provider, MD  levothyroxine (SYNTHROID, LEVOTHROID) 25 MCG tablet Take 25 mcg by mouth daily. 02/05/15  Yes Historical Provider, MD  lisinopril-hydrochlorothiazide (PRINZIDE,ZESTORETIC) 20-25 MG per tablet Take 1 tablet by mouth daily.   Yes Historical Provider, MD  metoprolol (LOPRESSOR) 50 MG tablet Take 50 mg by mouth 2 (two) times daily.   Yes Historical Provider, MD  potassium chloride SA (K-DUR,KLOR-CON) 20 MEQ tablet Take 0.5 tablets (10 mEq total) by mouth daily. 07/26/13  Yes Costin Karlyne Greenspan, MD   BP 191/91 mmHg  Pulse 63  Temp(Src) 98.8 F (37.1 C) (Oral)  Resp 20  SpO2 94% Physical Exam  Constitutional: She is oriented to person, place, and time. She appears well-developed and well-nourished. No distress.  HENT:  Head: Normocephalic and atraumatic.  Patient is fresh blood in the left nare. It is however patent. There is also blood streaking in the posterior oropharynx. No brisk bleeding is present at this time. Posterior airway is widely patent.  Eyes: EOM are normal. Pupils are equal, round, and reactive to light.  Pulmonary/Chest: Effort normal.  Musculoskeletal: Normal range of motion.  Neurological: She is alert and oriented to person, place, and time. No cranial nerve deficit. She exhibits normal muscle tone. Coordination normal.  Skin: Skin is warm and dry.  Psychiatric: She has a normal mood and affect.    ED Course  EPISTAXIS MANAGEMENT Date/Time: 02/28/2015 7:36 AM Performed by: Charlesetta Shanks Authorized by: Charlesetta Shanks Treatment site: left anterior Repair method: merocel sponge Post-procedure assessment: bleeding stopped Treatment complexity: simple Patient tolerance: Patient tolerated the procedure well with no immediate complications Comments: First placed was a rapid Rhino nasal tampon balloon 4.5 cm. This placed easily and was inflated. It did however extrude from the patient's nose. There was no rebleeding.  A Merocel sponge was placed instead with good seated position and no bleeding.   (including critical care time)  Labs Review Labs Reviewed  CBC WITH DIFFERENTIAL/PLATELET - Abnormal; Notable for the following:    RBC 5.73 (*)    Hemoglobin 15.8 (*)    HCT 48.7 (*)    All other components within normal limits  APTT  CBC WITH DIFFERENTIAL/PLATELET    Imaging Review No results found. I have personally reviewed and evaluated these images and lab results as part of my medical decision-making.   EKG Interpretation None      MDM   Final diagnoses:  Anterior epistaxis  Essential hypertension   Patient has spontaneous unilateral nasal bleeding. She does take daily aspirin but no other anticoagulants. Patient was hypertensive with no symptoms associated. Patient was given a.m. medications. Nasal bleeding was stopped with packing.    Charlesetta Shanks, MD 02/28/15 [phone]

## 2015-06-11 ENCOUNTER — Other Ambulatory Visit: Payer: Self-pay

## 2015-06-11 DIAGNOSIS — Z1231 Encounter for screening mammogram for malignant neoplasm of breast: Secondary | ICD-10-CM

## 2015-06-18 DIAGNOSIS — E876 Hypokalemia: Secondary | ICD-10-CM | POA: Insufficient documentation

## 2015-06-24 ENCOUNTER — Ambulatory Visit: Payer: Commercial Managed Care - HMO

## 2015-07-16 ENCOUNTER — Ambulatory Visit
Admission: RE | Admit: 2015-07-16 | Discharge: 2015-07-16 | Disposition: A | Discharge status: Home / Self Care | Payer: Medicare HMO | Source: Ambulatory Visit

## 2015-07-16 DIAGNOSIS — Z1231 Encounter for screening mammogram for malignant neoplasm of breast: Secondary | ICD-10-CM

## 2015-10-02 DIAGNOSIS — R601 Generalized edema: Secondary | ICD-10-CM | POA: Insufficient documentation

## 2015-10-02 DIAGNOSIS — I10 Essential (primary) hypertension: Secondary | ICD-10-CM | POA: Insufficient documentation

## 2016-12-26 ENCOUNTER — Emergency Department (HOSPITAL_BASED_OUTPATIENT_CLINIC_OR_DEPARTMENT_OTHER): Payer: Medicare PPO

## 2016-12-26 ENCOUNTER — Emergency Department (HOSPITAL_BASED_OUTPATIENT_CLINIC_OR_DEPARTMENT_OTHER)
Admission: EM | Admit: 2016-12-26 | Discharge: 2016-12-26 | Disposition: A | Discharge status: Home / Self Care | Payer: Medicare PPO | Attending: Emergency Medicine | Admitting: Emergency Medicine

## 2016-12-26 ENCOUNTER — Encounter (HOSPITAL_BASED_OUTPATIENT_CLINIC_OR_DEPARTMENT_OTHER): Payer: Self-pay | Admitting: Emergency Medicine

## 2016-12-26 DIAGNOSIS — Z7982 Long term (current) use of aspirin: Secondary | ICD-10-CM | POA: Insufficient documentation

## 2016-12-26 DIAGNOSIS — I509 Heart failure, unspecified: Secondary | ICD-10-CM | POA: Diagnosis not present

## 2016-12-26 DIAGNOSIS — M25562 Pain in left knee: Secondary | ICD-10-CM | POA: Insufficient documentation

## 2016-12-26 DIAGNOSIS — I110 Hypertensive heart disease with heart failure: Secondary | ICD-10-CM | POA: Diagnosis not present

## 2016-12-26 DIAGNOSIS — M79671 Pain in right foot: Secondary | ICD-10-CM | POA: Diagnosis not present

## 2016-12-26 DIAGNOSIS — Z79899 Other long term (current) drug therapy: Secondary | ICD-10-CM | POA: Insufficient documentation

## 2016-12-26 DIAGNOSIS — G8929 Other chronic pain: Secondary | ICD-10-CM

## 2016-12-26 MED ORDER — HYDROCODONE-ACETAMINOPHEN 5-325 MG PO TABS: 1.0000 | ORAL_TABLET | Freq: Four times a day (QID) | ORAL | 0 refills | Status: DC | PRN

## 2016-12-26 NOTE — Discharge Instructions (Signed)
Left knee with a significant arthritic changes. No evidence of any bony injury. Knee immobilizer for your comfort. Take pain medicine as needed. Make an appointment to follow-up with sports medicine upstairs. Regarding the chronic heel pain and skin changes recommend follow-up with podiatrist. Also can pointed out to the sports medicine specialist.

## 2016-12-26 NOTE — ED Provider Notes (Signed)
[place] DEPT [facility] Provider Note   CSN: [id number] Arrival date & time: 12/26/16  1723 By signing my name below, I, Dyke Brackett, attest that this documentation has been prepared under the direction and in the presence of Fredia Sorrow, MD . Electronically Signed: Dyke Brackett, Scribe. 12/26/2016. 6:32 PM.   History   Chief Complaint Chief Complaint  Patient presents with  . Knee Injury    HPI Michaela Baker is a 66 y.o. female with a historwho presents to the Emergency Department complaining of sudden onset, intermittent pain to left knee onset today shortly PTA. Pt states she was walking down the stairs at her home and felt her knee "pop." Knee pain is exacerbated by movement. Pt notes associated swelling to her left knee. She also reports some bilateral ankle swelling, back pain and SOB which she states is typical for her. Per pt, she also experienced this popping sensation last week. She denies any fall, specific injury or trauma. Pt is not currently followed by a orthopedist. She denies use of blood thinners. Pt denies any fever, chills, rhinorrhea, sore throat, cough, visual disturbance, CP, n/v/d, abdominal pain, dysuria, hematuria, joint swelling, or headaches.    She also reports a chronic wound to her right heel. Pt was seen by her PCP for the same and given a topical ointment with significant improvement. She states her podiatrist had been "scraping it and making it bleed", but she has not followed up with her podiatrist in over a year. No history of DM.   The history is provided by the patient. No language interpreter was used.    Past Medical History:  Diagnosis Date  . CHF (congestive heart failure) ([place])   . Hypertension     Patient Active Problem List   Diagnosis Date Noted  . MGUS (monoclonal gammopathy of unknown significance) 07/26/2013  . CHF (congestive heart failure) ([place]) 07/22/2013  . HTN (hypertension) 07/22/2013  . Acute exacerbation of CHF  (congestive heart failure) ([place]) 07/21/2013    Past Surgical History:  Procedure Laterality Date  . ABDOMINAL HYSTERECTOMY    . CHOLECYSTECTOMY    . TONSILLECTOMY    . TUBAL LIGATION      OB History    No data available      Home Medications    Prior to Admission medications   Medication Sig Start Date End Date Taking? Authorizing Provider  aspirin EC 81 MG EC tablet Take 1 tablet (81 mg total) by mouth daily. 07/26/13   Caren Griffins, MD  furosemide (LASIX) 40 MG tablet Take 1 tablet (40 mg total) by mouth daily. 07/26/13   Caren Griffins, MD  hydrALAZINE (APRESOLINE) 50 MG tablet Take 50 mg by mouth 2 (two) times daily.    [provider]  HYDROcodone-acetaminophen (NORCO/VICODIN) 5-325 MG tablet Take 1-2 tablets by mouth every 6 (six) hours as needed for moderate pain. 12/26/16   Fredia Sorrow, MD  levothyroxine (SYNTHROID, LEVOTHROID) 25 MCG tablet Take 25 mcg by mouth daily. 02/05/15   [provider]  lisinopril-hydrochlorothiazide (PRINZIDE,ZESTORETIC) 20-25 MG per tablet Take 1 tablet by mouth daily.    [provider]  metoprolol (LOPRESSOR) 50 MG tablet Take 50 mg by mouth 2 (two) times daily.    [provider]  potassium chloride SA (K-DUR,KLOR-CON) 20 MEQ tablet Take 0.5 tablets (10 mEq total) by mouth daily. 07/26/13   Caren Griffins, MD    Family History Family History  Problem Relation Age of Onset  .  Diabetes Mother   . Heart failure Mother   . Kidney failure Mother   . Kidney failure Sister   . Diabetes Brother     Social History Social History  Substance Use Topics  . Smoking status: Never Smoker  . Smokeless tobacco: Never Used  . Alcohol use No     Allergies   Patient has no known allergies.   Review of Systems Review of Systems  Constitutional: Negative for chills and fever.  HENT: Negative for rhinorrhea, sneezing and sore throat.   Eyes: Negative for visual disturbance.  Respiratory: Positive  for shortness of breath. Negative for cough.   Cardiovascular: Negative for chest pain.  Gastrointestinal: Negative for abdominal pain, diarrhea, nausea and vomiting.  Genitourinary: Negative for dysuria and hematuria.  Musculoskeletal: Positive for arthralgias, back pain and joint swelling.  Skin: Positive for rash.  Neurological: Negative for headaches.  Hematological: Does not bruise/bleed easily.  Psychiatric/Behavioral: Negative for confusion.   Physical Exam Updated Vital Signs Pulse 63   Temp 98.8 F (37.1 C) (Oral)   Resp 18   Ht 1.651 m (5\' 5" )   Wt 126.6 kg (279 lb)   SpO2 100%   BMI 46.43 kg/m   Physical Exam  Constitutional: She appears well-developed and well-nourished. No distress.  HENT:  Head: Normocephalic.  Mucous membranes are moist.  Eyes: Pupils are equal, round, and reactive to light. Conjunctivae and EOM are normal. No scleral icterus.  Neck: Neck supple.  Cardiovascular: Normal rate and regular rhythm.   Pulmonary/Chest: Effort normal. No respiratory distress. She has no wheezes. She has no rales.  Abdominal: Soft. Bowel sounds are normal. There is no tenderness.  Musculoskeletal: Normal range of motion. She exhibits no edema.  No pitting edema to BLE. 1+ DP pulses bilaterally. Erythematous marks to anterior left lower leg. Swelling to left lateral knee. Left knee cap grinds with movement. Discomfort with flexion of left knee.   Neurological: She is alert.  Skin: Skin is warm and dry.  Erythema to lateral aspect of right heel with elliptical area of healing skin measuring about 3 cm   Psychiatric: She has a normal mood and affect.  Nursing note and vitals reviewed.  ED Treatments / Results  DIAGNOSTIC STUDIES:  Oxygen Saturation is 100% on RA, normal by my interpretation.    COORDINATION OF CARE:  6:29 PM Discussed treatment plan with pt at bedside and pt agreed to plan.   Labs (all labs ordered are listed, but only abnormal results are  displayed) Labs Reviewed - No data to display  EKG  EKG Interpretation None       Radiology Dg Knee Complete 4 Views Left  Result Date: 12/26/2016 CLINICAL DATA:  Anterior left knee pain after feeling a pop in her knee coming down stairs this morning. EXAM: LEFT KNEE - COMPLETE 4+ VIEW COMPARISON:  None. FINDINGS: Tricompartmental degenerative changes. Moderate-sized effusion. No fracture or dislocation seen. IMPRESSION: No fracture. Moderate to marked tricompartmental degenerative changes with a moderate-sized effusion. Electronically Signed   By: Claudie Revering M.D.   On: 12/26/2016 18:09    Procedures Procedures (including critical care time)  Medications Ordered in ED Medications - No data to display   Initial Impression / Assessment and Plan / ED Course  I have reviewed the triage vital signs and the nursing notes.  Pertinent labs & imaging results that were available during my care of the patient were reviewed by me and considered in my medical decision making (see chart  for details).    Patient with left knee pain and swelling. X-rays show evidence of an effusion and significant arthritic changes in the knee. No evidence of any bony injury. Will treat with knee immobilizer pain medicine follow-up with sports medicine.  In addition patient has skin changes suggestive of a wound that is currently healed but not healed completely no open wound. This is been present for of several years. Last saw a podiatrist for this about a year ago. Somewhat reinjured recently. Patient said x-rays in the past that have been negative. This does not appear to be secondarily infected. There are some inflammatory changes in the area. No open wound as described above.   Final Clinical Impressions(s) / ED Diagnoses   Final diagnoses:  Acute pain of left knee  Chronic heel pain, right    New Prescriptions New Prescriptions   HYDROCODONE-ACETAMINOPHEN (NORCO/VICODIN) 5-325 MG TABLET    Take 1-2  tablets by mouth every 6 (six) hours as needed for moderate pain.    I personally performed the services described in this documentation, which was scribed in my presence. The recorded information has been reviewed and is accurate.      Fredia Sorrow, MD 12/26/16 [phone]

## 2016-12-26 NOTE — ED Triage Notes (Signed)
Patient reports that she was walking down the stairs at home and her left knee popped

## 2016-12-26 NOTE — ED Notes (Signed)
Patient transported to X-ray 

## 2016-12-30 ENCOUNTER — Ambulatory Visit (INDEPENDENT_AMBULATORY_CARE_PROVIDER_SITE_OTHER): Payer: Medicare PPO | Admitting: Family Medicine

## 2016-12-30 ENCOUNTER — Encounter: Payer: Self-pay | Admitting: Family Medicine

## 2016-12-30 DIAGNOSIS — M25562 Pain in left knee: Secondary | ICD-10-CM | POA: Diagnosis not present

## 2016-12-30 NOTE — Patient Instructions (Signed)
Your knee exam is reassuring. Your pain and swelling are due to arthritis. These are the different medications you can take for this: Tylenol 500mg  1-2 tabs three times a day for pain. Capsaicin, aspercreme, or biofreeze topically up to four times a day may also help with pain. Some supplements that may help for arthritis: Boswellia extract, curcumin, pycnogenol Aleve 1-2 tabs twice a day with food Cortisone injections are an option. If cortisone injections do not help, there are different types of shots that may help but they take longer to take effect. It's important that you continue to stay active. Straight leg raises, knee extensions 3 sets of 10 once a day (add ankle weight if these become too easy). Consider physical therapy to strengthen muscles around the joint that hurts to take pressure off of the joint itself. Shoe inserts with good arch support may be helpful. Ice 15 minutes at a time 3-4 times a day as needed to help with pain. Water aerobics and cycling with low resistance are the best two types of exercise for arthritis though any exercise is ok as long as it doesn't worsen the pain. Follow up with me in 1 month or as needed.

## 2017-01-03 DIAGNOSIS — G4733 Obstructive sleep apnea (adult) (pediatric): Secondary | ICD-10-CM | POA: Insufficient documentation

## 2017-01-03 DIAGNOSIS — M25562 Pain in left knee: Secondary | ICD-10-CM | POA: Insufficient documentation

## 2017-01-03 NOTE — Progress Notes (Signed)
PCP: [name], FNP  Subjective:   HPI: Patient is a 66 y.o. female here for left knee pain.  Patient reports she's had 2 weeks of anterior left knee pain. No known injury or trauma. Pain is more of a discomfort still 0/10 level. Recalls knee was popping 2 weeks ago and again on Monday. She was placed in immobilizer and has taken aleve, used biofreeze since then. No prior issues with left knee. No skin changes, numbness.  Past Medical History:  Diagnosis Date  . CHF (congestive heart failure) ([place])   . Hypertension     Current Outpatient Prescriptions on File Prior to Visit  Medication Sig Dispense Refill  . aspirin EC 81 MG EC tablet Take 1 tablet (81 mg total) by mouth daily. 30 tablet 1  . HYDROcodone-acetaminophen (NORCO/VICODIN) 5-325 MG tablet Take 1-2 tablets by mouth every 6 (six) hours as needed for moderate pain. 10 tablet 0  . levothyroxine (SYNTHROID, LEVOTHROID) 25 MCG tablet Take 25 mcg by mouth daily.  5   No current facility-administered medications on file prior to visit.     Past Surgical History:  Procedure Laterality Date  . ABDOMINAL HYSTERECTOMY    . CHOLECYSTECTOMY    . TONSILLECTOMY    . TUBAL LIGATION      No Known Allergies  Social History   Social History  . Marital status: Married    Spouse name: N/A  . Number of children: N/A  . Years of education: N/A   Occupational History  . Not on file.   Social History Main Topics  . Smoking status: Never Smoker  . Smokeless tobacco: Never Used  . Alcohol use No  . Drug use: No  . Sexual activity: No   Other Topics Concern  . Not on file   Social History Narrative  . No narrative on file    Family History  Problem Relation Age of Onset  . Diabetes Mother   . Heart failure Mother   . Kidney failure Mother   . Kidney failure Sister   . Diabetes Brother     BP 120/64   Pulse (!) 59   Ht 5\' 5"  (1.651 m)   Wt 279 lb (126.6 kg)   BMI 46.43 kg/m   Review of  Systems: See HPI above.     Objective:  Physical Exam:  Gen: NAD, comfortable in exam room  Left knee: Mild effusion.  No gross deformity, ecchymoses. No TTP currently. ROM 0-120 degrees. Negative ant/post drawers. Negative valgus/varus testing. Negative lachmanns. Negative mcmurrays, apleys, patellar apprehension. NV intact distally.  Right knee: FROM without pain.   Assessment & Plan:  1. Left knee pain - exam reassuring.  Consistent with flare of arthritis with effusion.  Discussed tylenol, topical medications, supplements, aleve.  Shown home exercises to do daily.  Consider injection, physical therapy if not improving.  Icing as needed, arch supports.  F/u in 1 month or prn.

## 2017-01-03 NOTE — Assessment & Plan Note (Signed)
exam reassuring.  Consistent with flare of arthritis with effusion.  Discussed tylenol, topical medications, supplements, aleve.  Shown home exercises to do daily.  Consider injection, physical therapy if not improving.  Icing as needed, arch supports.  F/u in 1 month or prn.

## 2017-01-27 ENCOUNTER — Ambulatory Visit: Payer: Medicare PPO | Admitting: Family Medicine

## 2017-03-28 ENCOUNTER — Encounter (HOSPITAL_COMMUNITY): Payer: Self-pay

## 2017-03-28 ENCOUNTER — Emergency Department (HOSPITAL_COMMUNITY): Payer: Medicare PPO

## 2017-03-28 ENCOUNTER — Observation Stay (HOSPITAL_COMMUNITY)
Admission: EM | Admit: 2017-03-28 | Discharge: 2017-03-30 | Disposition: A | Discharge status: Home / Self Care | Payer: Medicare PPO | Attending: Family Medicine | Admitting: Family Medicine

## 2017-03-28 DIAGNOSIS — Z833 Family history of diabetes mellitus: Secondary | ICD-10-CM | POA: Diagnosis not present

## 2017-03-28 DIAGNOSIS — Z90710 Acquired absence of both cervix and uterus: Secondary | ICD-10-CM | POA: Diagnosis not present

## 2017-03-28 DIAGNOSIS — R0902 Hypoxemia: Secondary | ICD-10-CM | POA: Diagnosis present

## 2017-03-28 DIAGNOSIS — Z79899 Other long term (current) drug therapy: Secondary | ICD-10-CM | POA: Insufficient documentation

## 2017-03-28 DIAGNOSIS — Z791 Long term (current) use of non-steroidal anti-inflammatories (NSAID): Secondary | ICD-10-CM | POA: Insufficient documentation

## 2017-03-28 DIAGNOSIS — D472 Monoclonal gammopathy: Secondary | ICD-10-CM | POA: Diagnosis not present

## 2017-03-28 DIAGNOSIS — R0600 Dyspnea, unspecified: Secondary | ICD-10-CM

## 2017-03-28 DIAGNOSIS — G4733 Obstructive sleep apnea (adult) (pediatric): Secondary | ICD-10-CM | POA: Insufficient documentation

## 2017-03-28 DIAGNOSIS — Z8249 Family history of ischemic heart disease and other diseases of the circulatory system: Secondary | ICD-10-CM | POA: Insufficient documentation

## 2017-03-28 DIAGNOSIS — E039 Hypothyroidism, unspecified: Secondary | ICD-10-CM | POA: Diagnosis not present

## 2017-03-28 DIAGNOSIS — Z79890 Hormone replacement therapy (postmenopausal): Secondary | ICD-10-CM | POA: Diagnosis not present

## 2017-03-28 DIAGNOSIS — Z9851 Tubal ligation status: Secondary | ICD-10-CM | POA: Insufficient documentation

## 2017-03-28 DIAGNOSIS — I5032 Chronic diastolic (congestive) heart failure: Secondary | ICD-10-CM | POA: Diagnosis not present

## 2017-03-28 DIAGNOSIS — I110 Hypertensive heart disease with heart failure: Secondary | ICD-10-CM | POA: Diagnosis not present

## 2017-03-28 DIAGNOSIS — J9601 Acute respiratory failure with hypoxia: Secondary | ICD-10-CM | POA: Diagnosis not present

## 2017-03-28 DIAGNOSIS — Z9049 Acquired absence of other specified parts of digestive tract: Secondary | ICD-10-CM | POA: Insufficient documentation

## 2017-03-28 DIAGNOSIS — Z7982 Long term (current) use of aspirin: Secondary | ICD-10-CM | POA: Diagnosis not present

## 2017-03-28 DIAGNOSIS — I4581 Long QT syndrome: Secondary | ICD-10-CM | POA: Insufficient documentation

## 2017-03-28 DIAGNOSIS — Z841 Family history of disorders of kidney and ureter: Secondary | ICD-10-CM | POA: Diagnosis not present

## 2017-03-28 DIAGNOSIS — I509 Heart failure, unspecified: Secondary | ICD-10-CM

## 2017-03-28 LAB — BASIC METABOLIC PANEL
ANION GAP: 9 (ref 5–15)
BUN: 10 mg/dL (ref 6–20)
CHLORIDE: 106 mmol/L (ref 101–111)
CO2: 24 mmol/L (ref 22–32)
Calcium: 8.7 mg/dL — ABNORMAL LOW (ref 8.9–10.3)
Creatinine, Ser: 0.96 mg/dL (ref 0.44–1.00)
GFR calc Af Amer: >60 mL/min (ref >60)
GFR calc non Af Amer: 60 mL/min — ABNORMAL LOW (ref >60)
GLUCOSE: 110 mg/dL — AB (ref 65–99)
POTASSIUM: 3.7 mmol/L (ref 3.5–5.1)
Sodium: 139 mmol/L (ref 135–145)

## 2017-03-28 LAB — BRAIN NATRIURETIC PEPTIDE: B Natriuretic Peptide: 219.4 pg/mL — ABNORMAL HIGH (ref 0.0–100.0)

## 2017-03-28 LAB — CBC
HEMATOCRIT: 45.2 % (ref 36.0–46.0)
HEMOGLOBIN: 15 g/dL (ref 12.0–15.0)
MCH: 28.1 pg (ref 26.0–34.0)
MCHC: 33.2 g/dL (ref 30.0–36.0)
MCV: 84.8 fL (ref 78.0–100.0)
Platelets: 169 10*3/uL (ref 150–400)
RBC: 5.33 MIL/uL — ABNORMAL HIGH (ref 3.87–5.11)
RDW: 14.1 % (ref 11.5–15.5)
WBC: 4.3 10*3/uL (ref 4.0–10.5)

## 2017-03-28 LAB — POCT I-STAT TROPONIN I: Troponin i, poc: 0.01 ng/mL (ref 0.00–0.08)

## 2017-03-28 MED ORDER — FUROSEMIDE 10 MG/ML IJ SOLN
40.0000 mg | Freq: Once | INTRAMUSCULAR | Status: AC
  Administered 2017-03-28: 40 mg via INTRAVENOUS
  Filled 2017-03-28: qty 4

## 2017-03-28 MED ORDER — DOXYCYCLINE HYCLATE 100 MG PO TABS: 100.0000 mg | ORAL_TABLET | Freq: Once | ORAL | Status: DC

## 2017-03-28 NOTE — ED Notes (Signed)
Pt reports that she ran out of her lasix 1 week ago and that is when she started having exertional SOB.

## 2017-03-28 NOTE — ED Provider Notes (Signed)
[facility] DEPT Provider Note   CSN: [id number] Arrival date & time: 03/28/17  1907     History   Chief Complaint Chief Complaint  Patient presents with  . Shortness of Breath    HPI Michaela Baker is a 66 y.o. female.  HPI   66 year old female with past medical history of hypertension, CHF, who presents with shortness of breath.  The patient got a flu shot 2 weeks ago.  Since then, she has been progressively more short of breath.  She has had a cough productive of yellow-green sputum.  She has felt otherwise generally fatigued.  She states that she is getting extremely fatigued with any amount of walking.  She has had no associated chest pain.  She denies any increasing lower extremity edema or orthopnea.  Patient arrived and was noted to be hypoxic.  She states she feels markedly improved after being placed on oxygen.  She previously was on oxygen but has not needed it for some time.  Denies any current chest pain.  Denies any current shortness of breath at rest.  Past Medical History:  Diagnosis Date  . CHF (congestive heart failure) ([place])   . Hypertension     Patient Active Problem List   Diagnosis Date Noted  . Dyspnea 03/29/2017  . Hypothyroidism 03/29/2017  . OSA (obstructive sleep apnea) 01/03/2017  . Left knee pain 01/03/2017  . MGUS (monoclonal gammopathy of unknown significance) 07/26/2013  . CHF (congestive heart failure) ([place]) 07/22/2013  . HTN (hypertension) 07/22/2013  . Acute exacerbation of CHF (congestive heart failure) ([place]) 07/21/2013    Past Surgical History:  Procedure Laterality Date  . ABDOMINAL HYSTERECTOMY    . CHOLECYSTECTOMY    . TONSILLECTOMY    . TUBAL LIGATION      OB History    No data available       Home Medications    Prior to Admission medications   Medication Sig Start Date End Date Taking? Authorizing Provider  aspirin EC 81 MG EC tablet Take 1 tablet (81 mg total) by mouth daily. 07/26/13   Yes Caren Griffins, MD  HYDROcodone-acetaminophen (NORCO/VICODIN) 5-325 MG tablet Take 1-2 tablets by mouth every 6 (six) hours as needed for moderate pain. 12/26/16  Yes Fredia Sorrow, MD  levothyroxine (SYNTHROID, LEVOTHROID) 25 MCG tablet Take 25 mcg by mouth daily. 02/05/15  Yes [provider]  lisinopril (PRINIVIL,ZESTRIL) 20 MG tablet Take 20 mg by mouth daily.  10/29/16  Yes [provider]  naproxen sodium (ALEVE) 220 MG tablet Take 440 mg by mouth daily as needed (pain).   Yes [provider]  potassium chloride (K-DUR,KLOR-CON) 10 MEQ tablet 10 mEq daily.  10/29/16  Yes [provider]  metoprolol succinate (TOPROL-XL) 100 MG 24 hr tablet Take 100 mg by mouth daily.  09/26/16   [provider]    Family History Family History  Problem Relation Age of Onset  . Diabetes Mother   . Heart failure Mother   . Kidney failure Mother   . Kidney failure Sister   . Diabetes Brother     Social History Social History  Substance Use Topics  . Smoking status: Never Smoker  . Smokeless tobacco: Never Used  . Alcohol use No     Allergies   Patient has no known allergies.   Review of Systems Review of Systems  Constitutional: Positive for fatigue. Negative for chills and fever.  HENT: Negative for congestion, rhinorrhea and sore throat.  Eyes: Negative for visual disturbance.  Respiratory: Positive for cough and shortness of breath. Negative for wheezing.   Cardiovascular: Negative for chest pain and leg swelling.  Gastrointestinal: Negative for abdominal pain, diarrhea, nausea and vomiting.  Genitourinary: Negative for dysuria, flank pain, vaginal bleeding and vaginal discharge.  Musculoskeletal: Negative for neck pain.  Skin: Negative for rash.  Allergic/Immunologic: Negative for immunocompromised state.  Neurological: Negative for syncope and headaches.  Hematological: Does not bruise/bleed easily.  All other systems reviewed and  are negative.    Physical Exam Updated Vital Signs BP 135/86   Pulse 69   Temp 98.2 F (36.8 C) (Oral)   Resp 16   Ht 5\' 5"  (1.651 m)   Wt 116.1 kg (256 lb)   SpO2 90%   BMI 42.60 kg/m   Physical Exam  Constitutional: She is oriented to person, place, and time. She appears well-developed and well-nourished. No distress.  HENT:  Head: Normocephalic and atraumatic.  Eyes: Conjunctivae are normal.  Neck: Neck supple.  Cardiovascular: Normal rate, regular rhythm and normal heart sounds.  Exam reveals no friction rub.   No murmur heard. Pulmonary/Chest: Effort normal. No respiratory distress. She has no wheezes. She has rales (Bibasilar).  Abdominal: Soft. She exhibits no distension.  Musculoskeletal: She exhibits edema (Mild, pitting).  Neurological: She is alert and oriented to person, place, and time. She exhibits normal muscle tone.  Skin: Skin is warm. Capillary refill takes less than 2 seconds.  Psychiatric: She has a normal mood and affect.  Nursing note and vitals reviewed.    ED Treatments / Results  Labs (all labs ordered are listed, but only abnormal results are displayed) Labs Reviewed  BASIC METABOLIC PANEL - Abnormal; Notable for the following:       Result Value   Glucose, Bld 110 (*)    Calcium 8.7 (*)    GFR calc non Af Amer 60 (*)    All other components within normal limits  CBC - Abnormal; Notable for the following:    RBC 5.33 (*)    All other components within normal limits  BRAIN NATRIURETIC PEPTIDE - Abnormal; Notable for the following:    B Natriuretic Peptide 219.4 (*)    All other components within normal limits  RESPIRATORY PANEL BY PCR  I-STAT TROPONIN, ED  POCT I-STAT TROPONIN I    EKG  EKG Interpretation  Date/Time:  Tuesday March 28 2017 19:38:12 EDT Ventricular Rate:  88 PR Interval:    QRS Duration: 84 QT Interval:  410 QTC Calculation: 497 R Axis:   40 Text Interpretation:  Sinus rhythm Ventricular bigeminy Probable  left atrial enlargement Borderline prolonged QT interval Baseline wander in lead(s) II III aVF Since last EKG, PVC/bigeminy is new No ST elevations or depressions Confirmed by Duffy Bruce [phone]) on 03/29/2017 12:15:00 AM       Radiology Dg Chest 2 View  Result Date: 03/28/2017 CLINICAL DATA:  Shortness of breath.  Productive cough. EXAM: CHEST  2 VIEW COMPARISON:  07/21/2013. FINDINGS: The heart is enlarged. Increased perihilar markings are noted, consistent with viral pneumonitis. No lobar consolidation. No definite findings to suggest pulmonary edema. No acute osseous abnormality. Worsening aeration from priors. IMPRESSION: Increased perihilar markings suggesting viral pneumonitis. No lobar consolidation or frank edema. Electronically Signed   By: Staci Righter M.D.   On: 03/28/2017 20:17    Procedures Procedures (including critical care time)  Medications Ordered in ED Medications  furosemide (LASIX) injection 40 mg (40 mg Intravenous  Given 03/28/17 2229)     Initial Impression / Assessment and Plan / ED Course  I have reviewed the triage vital signs and the nursing notes.  Pertinent labs & imaging results that were available during my care of the patient were reviewed by me and considered in my medical decision making (see chart for details).     66 year old female here with worsening shortness of breath. DDx includes viral pneumonia, early CAP, also CHF/hypervolemia. Pt not overtly hypervolemic clinically but does have elevated BNP, HTN. CXR read as c/f viral PNA. She is hypoxic here with significant DOE. Will admit for management of new onset hypoxia, possible diuresis. IV lasix given here.  Final Clinical Impressions(s) / ED Diagnoses   Final diagnoses:  Hypoxia    New Prescriptions New Prescriptions   No medications on file     Duffy Bruce, MD 03/29/17 0210

## 2017-03-28 NOTE — ED Triage Notes (Signed)
Pt complains of being short of breath for one week after receiving the flu shot, she hasn't had a reaction like that before Pt has hx of CHF

## 2017-03-28 NOTE — H&P (Addendum)
TR[facility] H&P   Patient Demographics:    Michaela Baker, is a 66 y.o. female  MRN: [id number]   DOB - 04-30-1951  Admit Date - 03/28/2017  Outpatient Primary MD for the patient is [name], FNP  Referring MD/NP/PA: Lindell Noe Outpatient Specialists:    Patient coming from: home  Chief Complaint  Patient presents with  . Shortness of Breath      HPI:    Michaela Baker  is a 66 y.o. female, w hypertension c/o dyspnea . Denies fever, chills, cp, palp, n/v, diarrhea, brbpr, black stool.  Dyspnea worse with exertion. Pt was brought to ED for evaluation   In ED. ,  CXR  IMPRESSION: Increased perihilar markings suggesting viral pneumonitis. No lobar consolidation or frank edema.  Na 139, K 3.7 Bun 10, Creatinine 0.96 Wbc 4.3, Hgb 15.0, Plt 169 BNP 219.4  Trop negative  Pt will be admitted for w/up of dyspnea.      Review of systems:    In addition to the HPI above,  No Fever-chills, No Headache, No changes with Vision or hearing, No problems swallowing food or Liquids, No Chest pain, Cough No Abdominal pain, No Nausea or Vommitting, Bowel movements are regular, No Blood in stool or Urine, No dysuria, No new skin rashes or bruises, No new joints pains-aches,  No new weakness, tingling, numbness in any extremity, No recent weight gain or loss, No polyuria, polydypsia or polyphagia, No significant Mental Stressors.  A full 10 point Review of Systems was done, except as stated above, all other Review of Systems were negative.   With Past History of the following :    Past Medical History:  Diagnosis Date  . CHF (congestive heart failure) ([place])   . Hypertension       Past Surgical History:  Procedure Laterality Date  . ABDOMINAL HYSTERECTOMY    . CHOLECYSTECTOMY    . TONSILLECTOMY    . TUBAL LIGATION        Social History:     Social  History  Substance Use Topics  . Smoking status: Never Smoker  . Smokeless tobacco: Never Used  . Alcohol use No     Lives - at home  Mobility - walks by self   Family History :     Family History  Problem Relation Age of Onset  . Diabetes Mother   . Heart failure Mother   . Kidney failure Mother   . Kidney failure Sister   . Diabetes Brother       Home Medications:   Prior to Admission medications   Medication Sig Start Date End Date Taking? Authorizing Provider  aspirin EC 81 MG EC tablet Take 1 tablet (81 mg total) by mouth daily. 07/26/13  Yes Caren Griffins, MD  HYDROcodone-acetaminophen (NORCO/VICODIN) 5-325 MG tablet Take 1-2 tablets by mouth every 6 (six) hours as needed  for moderate pain. 12/26/16  Yes Fredia Sorrow, MD  levothyroxine (SYNTHROID, LEVOTHROID) 25 MCG tablet Take 25 mcg by mouth daily. 02/05/15  Yes [provider]  lisinopril (PRINIVIL,ZESTRIL) 20 MG tablet Take 20 mg by mouth daily.  10/29/16  Yes [provider]  naproxen sodium (ALEVE) 220 MG tablet Take 440 mg by mouth daily as needed (pain).   Yes [provider]  potassium chloride (K-DUR,KLOR-CON) 10 MEQ tablet 10 mEq daily.  10/29/16  Yes [provider]  metoprolol succinate (TOPROL-XL) 100 MG 24 hr tablet Take 100 mg by mouth daily.  09/26/16   [provider]     Allergies:    No Known Allergies   Physical Exam:   Vitals  Blood pressure (!) 157/104, pulse 91, temperature 98.2 F (36.8 C), temperature source Oral, resp. rate (!) 24, height 5\' 5"  (1.651 m), weight 116.1 kg (256 lb), SpO2 97 %.   1. General  lying in bed in NAD,   2. Normal affect and insight, Not Suicidal or Homicidal, Awake Alert, Oriented X 3.  3. No F.N deficits, ALL C.Nerves Intact, Strength 5/5 all 4 extremities, Sensation intact all 4 extremities, Plantars down going.  4. Ears and Eyes appear Normal, Conjunctivae clear, PERRLA. Moist Oral Mucosa.  5. Supple Neck,  slight JVD, No cervical lymphadenopathy appriciated, No Carotid Bruits.  6. Symmetrical Chest wall movement, Good air movement bilaterally, CTAB.  7. RRR, No Gallops, Rubs or Murmurs, No Parasternal Heave.  8. Positive Bowel Sounds, Abdomen Soft, No tenderness, No organomegaly appriciated,No rebound -guarding or rigidity.  9.  No Cyanosis, Normal Skin Turgor, No Skin Rash or Bruise.  10. Good muscle tone,  joints appear normal , no effusions, Normal ROM.  11. No Palpable Lymph Nodes in Neck or Axillae     Data Review:    CBC  Recent Labs Lab 03/28/17 2004  WBC 4.3  HGB 15.0  HCT 45.2  PLT 169  MCV 84.8  MCH 28.1  MCHC 33.2  RDW 14.1   ------------------------------------------------------------------------------------------------------------------  Chemistries   Recent Labs Lab 03/28/17 2004  NA 139  K 3.7  CL 106  CO2 24  GLUCOSE 110*  BUN 10  CREATININE 0.96  CALCIUM 8.7*   ------------------------------------------------------------------------------------------------------------------ estimated creatinine clearance is 72.4 mL/min (by C-G formula based on SCr of 0.96 mg/dL). ------------------------------------------------------------------------------------------------------------------ No results for input(s): TSH, T4TOTAL, T3FREE, THYROIDAB in the last 72 hours.  Invalid input(s): FREET3  Coagulation profile No results for input(s): INR, PROTIME in the last 168 hours. ------------------------------------------------------------------------------------------------------------------- No results for input(s): DDIMER in the last 72 hours. -------------------------------------------------------------------------------------------------------------------  Cardiac Enzymes No results for input(s): CKMB, TROPONINI, MYOGLOBIN in the last 168 hours.  Invalid input(s):  CK ------------------------------------------------------------------------------------------------------------------    Component Value Date/Time   BNP 219.4 (H) 03/28/2017 2004     ---------------------------------------------------------------------------------------------------------------  Urinalysis    Component Value Date/Time   COLORURINE YELLOW 07/14/2010 2041   APPEARANCEUR CLEAR 07/14/2010 2041   LABSPEC 1.029 07/14/2010 2041   PHURINE 6.0 07/14/2010 2041   HGBUR NEGATIVE 07/14/2010 2041   BILIRUBINUR NEGATIVE 07/14/2010 2041   KETONESUR NEGATIVE 07/14/2010 2041   PROTEINUR NEGATIVE 07/14/2010 2041   UROBILINOGEN 0.2 07/14/2010 2041   NITRITE NEGATIVE 07/14/2010 2041   LEUKOCYTESUR TRACE (A) 07/14/2010 2041    ----------------------------------------------------------------------------------------------------------------   Imaging Results:    Dg Chest 2 View  Result Date: 03/28/2017 CLINICAL DATA:  Shortness of breath.  Productive cough. EXAM: CHEST  2 VIEW COMPARISON:  07/21/2013. FINDINGS: The heart is enlarged.  Increased perihilar markings are noted, consistent with viral pneumonitis. No lobar consolidation. No definite findings to suggest pulmonary edema. No acute osseous abnormality. Worsening aeration from priors. IMPRESSION: Increased perihilar markings suggesting viral pneumonitis. No lobar consolidation or frank edema. Electronically Signed   By: Staci Righter M.D.   On: 03/28/2017 20:17     Assessment & Plan:    Principal Problem:   Dyspnea Active Problems:   CHF (congestive heart failure) ([facility])   OSA (obstructive sleep apnea)   Hypothyroidism    Dyspnea ? Mild CHF, vs obeisity / restrictive lung disease vs OSA vs viral pneumonitis Tele Trop I q6h x3 TSH Respiratory viral panel Check cardiac echo Outpatient PFT Lasix 40mg  iv qday  CHF Cont metoprolol Cont lisinopril  Hypothyroidism Check tsh Cont levothyroxine    DVT Prophylaxis  Lovenox - SCDs   AM Labs Ordered, also please review Full Orders  Family Communication: Admission, patients condition and plan of care including tests being ordered have been discussed with the patient who indicate understanding and agree with the plan and Code Status.  Code Status FULL CODE  Likely DC to  home  Condition GUARDED   Consults called: none  Admission status: observation   Time spent in minutes : 45   Jani Gravel M.D on 03/29/2017 at 12:20 AM  Between 7am to 7pm - Pager - [phone]. After 7pm go to [email or web address] - password [facility]  [facility] - Office  [phone]

## 2017-03-29 ENCOUNTER — Observation Stay (HOSPITAL_BASED_OUTPATIENT_CLINIC_OR_DEPARTMENT_OTHER): Payer: Medicare PPO

## 2017-03-29 DIAGNOSIS — G4733 Obstructive sleep apnea (adult) (pediatric): Secondary | ICD-10-CM | POA: Diagnosis not present

## 2017-03-29 DIAGNOSIS — R0902 Hypoxemia: Secondary | ICD-10-CM

## 2017-03-29 DIAGNOSIS — R0600 Dyspnea, unspecified: Secondary | ICD-10-CM | POA: Diagnosis not present

## 2017-03-29 DIAGNOSIS — E039 Hypothyroidism, unspecified: Secondary | ICD-10-CM

## 2017-03-29 DIAGNOSIS — I509 Heart failure, unspecified: Secondary | ICD-10-CM

## 2017-03-29 HISTORY — DX: Hypothyroidism, unspecified: E03.9

## 2017-03-29 LAB — CBC
HCT: 47.9 % — ABNORMAL HIGH (ref 36.0–46.0)
Hemoglobin: 15.7 g/dL — ABNORMAL HIGH (ref 12.0–15.0)
MCH: 28 pg (ref 26.0–34.0)
MCHC: 32.8 g/dL (ref 30.0–36.0)
MCV: 85.4 fL (ref 78.0–100.0)
PLATELETS: 145 10*3/uL — AB (ref 150–400)
RBC: 5.61 MIL/uL — ABNORMAL HIGH (ref 3.87–5.11)
RDW: 14 % (ref 11.5–15.5)
WBC: 3.5 10*3/uL — ABNORMAL LOW (ref 4.0–10.5)

## 2017-03-29 LAB — RESPIRATORY PANEL BY PCR
ADENOVIRUS-RVPPCR: NOT DETECTED
Bordetella pertussis: NOT DETECTED
CORONAVIRUS HKU1-RVPPCR: NOT DETECTED
CORONAVIRUS NL63-RVPPCR: NOT DETECTED
Chlamydophila pneumoniae: NOT DETECTED
Coronavirus 229E: NOT DETECTED
Coronavirus OC43: NOT DETECTED
Influenza A: NOT DETECTED
Influenza B: NOT DETECTED
METAPNEUMOVIRUS-RVPPCR: NOT DETECTED
Mycoplasma pneumoniae: NOT DETECTED
PARAINFLUENZA VIRUS 2-RVPPCR: NOT DETECTED
PARAINFLUENZA VIRUS 3-RVPPCR: NOT DETECTED
Parainfluenza Virus 1: NOT DETECTED
Parainfluenza Virus 4: NOT DETECTED
RHINOVIRUS / ENTEROVIRUS - RVPPCR: NOT DETECTED
Respiratory Syncytial Virus: NOT DETECTED

## 2017-03-29 LAB — COMPREHENSIVE METABOLIC PANEL WITH GFR
ALT: 15 U/L (ref 14–54)
AST: 21 U/L (ref 15–41)
Albumin: 3.5 g/dL (ref 3.5–5.0)
Alkaline Phosphatase: 63 U/L (ref 38–126)
Anion gap: 9 (ref 5–15)
BUN: 12 mg/dL (ref 6–20)
CO2: 27 mmol/L (ref 22–32)
Calcium: 9 mg/dL (ref 8.9–10.3)
Chloride: 106 mmol/L (ref 101–111)
Creatinine, Ser: 0.94 mg/dL (ref 0.44–1.00)
GFR calc Af Amer: >60 mL/min
GFR calc non Af Amer: >60 mL/min
Glucose, Bld: 86 mg/dL (ref 65–99)
Potassium: 4 mmol/L (ref 3.5–5.1)
Sodium: 142 mmol/L (ref 135–145)
Total Bilirubin: 0.9 mg/dL (ref 0.3–1.2)
Total Protein: 8.8 g/dL — ABNORMAL HIGH (ref 6.5–8.1)

## 2017-03-29 LAB — ECHOCARDIOGRAM COMPLETE
Height: 66 in
Weight: 4179.2 [oz_av]

## 2017-03-29 LAB — TSH: TSH: 0.241 u[IU]/mL — ABNORMAL LOW (ref 0.350–4.500)

## 2017-03-29 LAB — TROPONIN I
Troponin I: <0.03 ng/mL
Troponin I: <0.03 ng/mL

## 2017-03-29 MED ORDER — ENOXAPARIN SODIUM 60 MG/0.6ML ~~LOC~~ SOLN
60.0000 mg | SUBCUTANEOUS | Status: DC
  Administered 2017-03-29: 60 mg via SUBCUTANEOUS
  Filled 2017-03-29: qty 0.6

## 2017-03-29 MED ORDER — ACETAMINOPHEN 325 MG PO TABS: 650.0000 mg | ORAL_TABLET | Freq: Four times a day (QID) | ORAL | Status: DC | PRN

## 2017-03-29 MED ORDER — SODIUM CHLORIDE 0.9% FLUSH: 3.0000 mL | INTRAVENOUS | Status: DC | PRN

## 2017-03-29 MED ORDER — METOPROLOL SUCCINATE ER 100 MG PO TB24
100.0000 mg | ORAL_TABLET | Freq: Every day | ORAL | Status: DC
  Administered 2017-03-29 – 2017-03-30 (×2): 100 mg via ORAL
  Filled 2017-03-29 (×2): qty 1

## 2017-03-29 MED ORDER — FUROSEMIDE 10 MG/ML IJ SOLN
40.0000 mg | Freq: Every day | INTRAMUSCULAR | Status: DC
  Administered 2017-03-29: 40 mg via INTRAVENOUS
  Filled 2017-03-29: qty 4

## 2017-03-29 MED ORDER — SODIUM CHLORIDE 0.9 % IV SOLN: 250.0000 mL | INTRAVENOUS | Status: DC | PRN

## 2017-03-29 MED ORDER — ASPIRIN EC 81 MG PO TBEC
81.0000 mg | DELAYED_RELEASE_TABLET | Freq: Every day | ORAL | Status: DC
  Administered 2017-03-29 – 2017-03-30 (×2): 81 mg via ORAL
  Filled 2017-03-29 (×2): qty 1

## 2017-03-29 MED ORDER — LISINOPRIL 20 MG PO TABS
20.0000 mg | ORAL_TABLET | Freq: Every day | ORAL | Status: DC
  Administered 2017-03-29 – 2017-03-30 (×2): 20 mg via ORAL
  Filled 2017-03-29 (×2): qty 1

## 2017-03-29 MED ORDER — HYDRALAZINE HCL 20 MG/ML IJ SOLN: 10.0000 mg | INTRAMUSCULAR | Status: DC | PRN

## 2017-03-29 MED ORDER — ACETAMINOPHEN 650 MG RE SUPP: 650.0000 mg | Freq: Four times a day (QID) | RECTAL | Status: DC | PRN

## 2017-03-29 MED ORDER — LEVOTHYROXINE SODIUM 25 MCG PO TABS
25.0000 ug | ORAL_TABLET | Freq: Every day | ORAL | Status: DC
  Administered 2017-03-30: 25 ug via ORAL
  Filled 2017-03-29: qty 1

## 2017-03-29 MED ORDER — LISINOPRIL 20 MG PO TABS: 20.0000 mg | ORAL_TABLET | Freq: Every day | ORAL | Status: DC

## 2017-03-29 MED ORDER — POTASSIUM CHLORIDE CRYS ER 10 MEQ PO TBCR
10.0000 meq | EXTENDED_RELEASE_TABLET | Freq: Every day | ORAL | Status: DC
  Administered 2017-03-29 – 2017-03-30 (×2): 10 meq via ORAL
  Filled 2017-03-29 (×2): qty 1

## 2017-03-29 MED ORDER — METOPROLOL SUCCINATE ER 100 MG PO TB24: 100.0000 mg | ORAL_TABLET | Freq: Every day | ORAL | Status: DC

## 2017-03-29 MED ORDER — SODIUM CHLORIDE 0.9% FLUSH
3.0000 mL | Freq: Two times a day (BID) | INTRAVENOUS | Status: DC
  Administered 2017-03-29 (×2): 3 mL via INTRAVENOUS

## 2017-03-29 NOTE — ED Notes (Signed)
Admitting nurse at bedside 

## 2017-03-29 NOTE — Progress Notes (Signed)
   03/29/17 1400  Clinical Encounter Type  Visited With Patient;Family  Visit Type Initial  Spiritual Encounters  Spiritual Needs Emotional   Rounding on the floor meet this patient and some of her family.  She is feeling much better and hopeful to go home tomorrow.  A great outlook on life and taking care of herself.  Will follow as needed. Chaplain Katherene Ponto

## 2017-03-29 NOTE — ED Notes (Signed)
Patient denies pain and is resting comfortably.  

## 2017-03-29 NOTE — ED Notes (Signed)
Pt was sating in the 88-90% range while sleeping. Placed on 2L/[place]. Sats rose to 94%

## 2017-03-29 NOTE — Progress Notes (Signed)
  Echocardiogram 2D Echocardiogram has been performed.  Michaela Baker Michaela Baker 03/29/2017, 3:27 PM

## 2017-03-29 NOTE — ED Notes (Signed)
Call report to Briana at [phone] at [phone]

## 2017-03-29 NOTE — ED Notes (Signed)
Pt resting comfortably at this time.

## 2017-03-29 NOTE — Progress Notes (Signed)
[facility] PROGRESS NOTE  ZULEY LUTTER  ESP:[id number] DOB: [birth year] DOA: 03/28/2017 PCP: [name], FNP  Brief Narrative: Michaela Baker is a 66 y.o. female with a history of HTN who presented to the ED with shortness of breath worse on exertion with occasional productive sputum associated with fatigue. She was hypoxic on room air on arrival, symptomatically very improved with supplemental oxygen. CXR was suggestive of viral pneumonitis, also showing cardiomegaly and BNP modestly elevated at 219. IV lasix was given and she was admitted.   Subjective: Dyspnea improved. No chest pain. Denies significant orthopnea or leg swelling/tenderness.   Objective: BP (!) 201/91 (BP Location: Right Arm)   Pulse 60   Temp 97.9 F (36.6 C) (Oral)   Resp 20   Ht 5\' 6"  (1.676 m)   Wt 118.5 kg (261 lb 3.2 oz)   SpO2 94%   BMI 42.16 kg/m   Gen: Well-appearing 66yo F  Pulm: Clear and nonlabored on room air  CV: RRR, no murmur, no JVD, no edema GI: Soft, NT, ND, +BS  Neuro: Alert and oriented. No focal deficits. Ext: Warm, no deformities Skin: No rashes, lesions no ulcers  Assessment & Plan: Acute hypoxic respiratory failure: Resolving. Possibly due to viral pneumonitis, though RVP was negative. CHF maybe contributing, though not overtly volume overloaded on exam.  - Pending echocardiogram. - Continue IV lasix while here.  - If improving, may discharge later today. If still very symptomatic, will keep for continued diuresis. - Recommend outpatient pulmonology evaluation, PFTs, and sleep study.  Hypothyroidism: Chronic, stable.  - Checking TSH - Continue synthroid  Chronic CHF: Carries this Dx.  - Echocardiogram as above.  - Continue metoprolol and lisinopril along with lasix for now.   Ventricular bigeminy:  - Continue telemetry - Would benefit from cardiology referral as outpatient.   Vance Gather, MD [facility] Pager [phone]  If 7PM-7AM, please contact  night-coverage [email or web address] Password [email or web address] 03/29/2017, 1:58 PM

## 2017-03-30 DIAGNOSIS — R0600 Dyspnea, unspecified: Secondary | ICD-10-CM | POA: Diagnosis not present

## 2017-03-30 DIAGNOSIS — G4733 Obstructive sleep apnea (adult) (pediatric): Secondary | ICD-10-CM | POA: Diagnosis not present

## 2017-03-30 DIAGNOSIS — E039 Hypothyroidism, unspecified: Secondary | ICD-10-CM | POA: Diagnosis not present

## 2017-03-30 DIAGNOSIS — I509 Heart failure, unspecified: Secondary | ICD-10-CM | POA: Diagnosis not present

## 2017-03-30 LAB — TROPONIN I

## 2017-03-30 NOTE — Progress Notes (Addendum)
Patient's O2 staying at 88% on RA while sleeping. No hx of COPD & patient has no c/o shortness of breath or trouble breathing. Paged Baltazar Najjar. New order for 2L O2 or keep O2 sats greater than 92%. Continue to monitor.

## 2017-03-30 NOTE — Discharge Summary (Signed)
Physician Discharge Summary  Michaela Baker:[id number] DOB: [birth year] DOA: 03/28/2017  PCP: [name], FNP  Admit date: 03/28/2017 Discharge date: 03/30/2017  Admitted From: Home Disposition: Home   Recommendations for Outpatient Follow-up:  1. Follow up with PCP in 1-2 weeks 2. Suggest recheck TSH levels, slightly depressed level on check while inpatient. No synthroid dose changes made. She is asymptomatic.  3. Suggest sleep study and likely need for CPAP qHS. Desaturated to 88% on room air while sleeping with no observed apnea and no reported dyspnea.  4. Consider cardiac monitoring, demonstrated occasional ventricular bigeminy here, asymptomatic.  Home Health: None Equipment/Devices: None Discharge Condition: Stable CODE STATUS: Full Diet recommendation: Heart healthy  Brief/Interim Summary: Michaela Baker is a 66 y.o. female with a history of HTN who presented to the ED 10/30 with shortness of breath worse on exertion with occasional productive sputum associated with fatigue. She was hypoxic on room air on arrival, symptomatically very improved with supplemental oxygen. CXR was suggestive of viral pneumonitis, also showing cardiomegaly and BNP modestly elevated at 219. IV lasix was given and she was admitted with ongoing improvement prior to discharge 11/1.   Discharge Diagnoses:  Principal Problem:   Dyspnea Active Problems:   CHF (congestive heart failure) (HCC)   OSA (obstructive sleep apnea)   Hypothyroidism  Acute hypoxic respiratory failure: Resolved. Possibly due to viral pneumonitis. CHF maybe contributing, though not overtly volume overloaded on exam. Troponins negative.  - Recommend outpatient pulmonology evaluation, PFTs, and sleep study.  Hypothyroidism: Chronic, stable. TSH 0.241 - Continue synthroid and follow up as outpatient  Chronic HFpEF: Stable echocardiogram findings during admission. G1DD, preserved EF, IVC wnl. BNP 219.  - Continue  metoprolol and lisinopril. Appears euvolemic on exam, so will not continue lasix.   Ventricular bigeminy:  - Continue telemetry - Would benefit from cardiology referral as outpatient.   Discharge Instructions Discharge Instructions    Diet - low sodium heart healthy   Complete by:  As directed    Discharge instructions   Complete by:  As directed    You were admitted with low oxygen levels which have improved. This was possibly due to a viral infection in the lungs, though this is expected to resolve on its own. Follow up with your PCP soon and you may need another sleep study as it is probable that you have sleep apnea. Continue taking medications as you were and return sooner if symptoms return.   Increase activity slowly   Complete by:  As directed      Allergies as of 03/30/2017   No Known Allergies     Medication List    TAKE these medications   aspirin 81 MG EC tablet Take 1 tablet (81 mg total) by mouth daily.   HYDROcodone-acetaminophen 5-325 MG tablet Commonly known as:  NORCO/VICODIN Take 1-2 tablets by mouth every 6 (six) hours as needed for moderate pain.   levothyroxine 25 MCG tablet Commonly known as:  SYNTHROID, LEVOTHROID Take 25 mcg by mouth daily.   lisinopril 20 MG tablet Commonly known as:  PRINIVIL,ZESTRIL Take 20 mg by mouth daily.   metoprolol succinate 100 MG 24 hr tablet Commonly known as:  TOPROL-XL Take 100 mg by mouth daily.   naproxen sodium 220 MG tablet Commonly known as:  ALEVE Take 440 mg by mouth daily as needed (pain).   potassium chloride 10 MEQ tablet Commonly known as:  K-DUR,KLOR-CON 10 mEq daily.      Follow-up Information  [name], FNP. Schedule an appointment as soon as possible for a visit.   Specialty:  Nurse Practitioner Contact information: [place] [address] [phone]          No Known Allergies  Consultations:  None  Procedures/Studies: Dg Chest 2  View  Result Date: 03/28/2017 CLINICAL DATA:  Shortness of breath.  Productive cough. EXAM: CHEST  2 VIEW COMPARISON:  07/21/2013. FINDINGS: The heart is enlarged. Increased perihilar markings are noted, consistent with viral pneumonitis. No lobar consolidation. No definite findings to suggest pulmonary edema. No acute osseous abnormality. Worsening aeration from priors. IMPRESSION: Increased perihilar markings suggesting viral pneumonitis. No lobar consolidation or frank edema. Electronically Signed   By: Staci Righter M.D.   On: 03/28/2017 20:17   - Left ventricle: The cavity size was normal. Wall thickness was   increased in a pattern of mild LVH. Systolic function was normal.   The estimated ejection fraction was in the range of 60% to 65%.   Wall motion was normal; there were no regional wall motion   abnormalities. Doppler parameters are consistent with abnormal   left ventricular relaxation (grade 1 diastolic dysfunction). The   E/e&' ratio is between 8-15, suggesting indeterminate LV filling   pressure. - Mitral valve: Mildly thickened leaflets . There was mild   regurgitation. - Left atrium: The atrium was normal in size. - Right atrium: The atrium was mildly dilated. - Inferior vena cava: The vessel was normal in size. The   respirophasic diameter changes were in the normal range (>= 50%),   consistent with normal central venous pressure.  Impressions: - Compared to a prior study in 2015, there are no significant   changes.  Subjective: Feels well. Dressed and ready to go this morning. Had a great night. Breathing at baseline.   Discharge Exam: Vitals:   03/29/17 2115 03/30/17 0450  BP: (!) 172/90 (!) 121/41  Pulse: 63 (!) 52  Resp: 20 18  Temp: 98.3 F (36.8 C) 98.3 F (36.8 C)  SpO2: 97% 94%   General: Pt is alert, awake, not in acute distress Cardiovascular: RRR, S1/S2 +, no rubs, no gallops Respiratory: CTA bilaterally, no wheezing, no rhonchi Abdominal: Soft,  NT, ND, bowel sounds + Extremities: No edema, no cyanosis  Labs: BNP (last 3 results) Recent Labs    03/28/17 2004  BNP 626.9*   Basic Metabolic Panel: Recent Labs  Lab 03/28/17 2004 03/29/17 1255  NA 139 142  K 3.7 4.0  CL 106 106  CO2 24 27  GLUCOSE 110* 86  BUN 10 12  CREATININE 0.96 0.94  CALCIUM 8.7* 9.0   Liver Function Tests: Recent Labs  Lab 03/29/17 1255  AST 21  ALT 15  ALKPHOS 63  BILITOT 0.9  PROT 8.8*  ALBUMIN 3.5   No results for input(s): LIPASE, AMYLASE in the last 168 hours. No results for input(s): AMMONIA in the last 168 hours. CBC: Recent Labs  Lab 03/28/17 2004 03/29/17 1255  WBC 4.3 3.5*  HGB 15.0 15.7*  HCT 45.2 47.9*  MCV 84.8 85.4  PLT 169 145*   Cardiac Enzymes: Recent Labs  Lab 03/29/17 1255 03/29/17 1829 03/30/17 0036  TROPONINI <0.03 <0.03 <0.03   BNP: Invalid input(s): POCBNP CBG: No results for input(s): GLUCAP in the last 168 hours. D-Dimer No results for input(s): DDIMER in the last 72 hours. Hgb A1c No results for input(s): HGBA1C in the last 72 hours. Lipid Profile No results  for input(s): CHOL, HDL, LDLCALC, TRIG, CHOLHDL, LDLDIRECT in the last 72 hours. Thyroid function studies No results for input(s): TSH, T4TOTAL, T3FREE, THYROIDAB in the last 72 hours.  Invalid input(s): FREET3 Anemia work up No results for input(s): VITAMINB12, FOLATE, FERRITIN, TIBC, IRON, RETICCTPCT in the last 72 hours. Urinalysis    Component Value Date/Time   COLORURINE YELLOW 07/14/2010 2041   APPEARANCEUR CLEAR 07/14/2010 2041   LABSPEC 1.029 07/14/2010 2041   PHURINE 6.0 07/14/2010 2041   HGBUR NEGATIVE 07/14/2010 2041   BILIRUBINUR NEGATIVE 07/14/2010 2041   KETONESUR NEGATIVE 07/14/2010 2041   PROTEINUR NEGATIVE 07/14/2010 2041   UROBILINOGEN 0.2 07/14/2010 2041   NITRITE NEGATIVE 07/14/2010 2041   LEUKOCYTESUR TRACE (A) 07/14/2010 2041    Microbiology Recent Results (from the past 240 hour(s))  Respiratory  Panel by PCR     Status: None   Collection Time: 03/29/17 12:18 AM  Result Value Ref Range Status   Adenovirus NOT DETECTED NOT DETECTED Final   Coronavirus 229E NOT DETECTED NOT DETECTED Final   Coronavirus HKU1 NOT DETECTED NOT DETECTED Final   Coronavirus NL63 NOT DETECTED NOT DETECTED Final   Coronavirus OC43 NOT DETECTED NOT DETECTED Final   Metapneumovirus NOT DETECTED NOT DETECTED Final   Rhinovirus / Enterovirus NOT DETECTED NOT DETECTED Final   Influenza A NOT DETECTED NOT DETECTED Final   Influenza B NOT DETECTED NOT DETECTED Final   Parainfluenza Virus 1 NOT DETECTED NOT DETECTED Final   Parainfluenza Virus 2 NOT DETECTED NOT DETECTED Final   Parainfluenza Virus 3 NOT DETECTED NOT DETECTED Final   Parainfluenza Virus 4 NOT DETECTED NOT DETECTED Final   Respiratory Syncytial Virus NOT DETECTED NOT DETECTED Final   Bordetella pertussis NOT DETECTED NOT DETECTED Final   Chlamydophila pneumoniae NOT DETECTED NOT DETECTED Final   Mycoplasma pneumoniae NOT DETECTED NOT DETECTED Final    Comment: Performed at [facility], [place] [address], [place], [place] [address]    Time coordinating discharge: Approximately 40 minutes  Vance Gather, MD  [facility] 04/02/2017, 5:14 PM Pager [phone]

## 2018-06-18 ENCOUNTER — Other Ambulatory Visit: Payer: Self-pay | Admitting: Nurse Practitioner

## 2018-06-18 DIAGNOSIS — Z1231 Encounter for screening mammogram for malignant neoplasm of breast: Secondary | ICD-10-CM

## 2018-07-02 ENCOUNTER — Ambulatory Visit
Admission: RE | Admit: 2018-07-02 | Discharge: 2018-07-02 | Disposition: A | Discharge status: Home / Self Care | Payer: Medicare HMO | Source: Ambulatory Visit | Attending: Nurse Practitioner | Admitting: Nurse Practitioner

## 2018-07-02 DIAGNOSIS — Z1231 Encounter for screening mammogram for malignant neoplasm of breast: Secondary | ICD-10-CM

## 2019-08-13 ENCOUNTER — Other Ambulatory Visit: Payer: Self-pay | Admitting: Nurse Practitioner

## 2019-08-13 DIAGNOSIS — Z1231 Encounter for screening mammogram for malignant neoplasm of breast: Secondary | ICD-10-CM

## 2019-08-16 DIAGNOSIS — M79673 Pain in unspecified foot: Secondary | ICD-10-CM | POA: Insufficient documentation

## 2019-09-06 ENCOUNTER — Other Ambulatory Visit: Payer: Self-pay

## 2019-09-06 ENCOUNTER — Ambulatory Visit
Admission: RE | Admit: 2019-09-06 | Discharge: 2019-09-06 | Disposition: A | Discharge status: Home / Self Care | Payer: Medicare Other | Source: Ambulatory Visit | Attending: Nurse Practitioner | Admitting: Nurse Practitioner

## 2019-09-06 DIAGNOSIS — Z1231 Encounter for screening mammogram for malignant neoplasm of breast: Secondary | ICD-10-CM

## 2020-01-29 ENCOUNTER — Other Ambulatory Visit: Payer: Self-pay | Admitting: Nurse Practitioner

## 2020-01-30 ENCOUNTER — Other Ambulatory Visit: Payer: Self-pay | Admitting: Nurse Practitioner

## 2020-01-30 DIAGNOSIS — Z13820 Encounter for screening for osteoporosis: Secondary | ICD-10-CM

## 2020-02-07 ENCOUNTER — Ambulatory Visit: Payer: Medicare HMO | Admitting: Cardiology

## 2020-02-07 ENCOUNTER — Encounter: Payer: Self-pay | Admitting: Cardiology

## 2020-02-07 ENCOUNTER — Other Ambulatory Visit: Payer: Self-pay

## 2020-02-07 ENCOUNTER — Ambulatory Visit: Payer: Medicare HMO

## 2020-02-07 VITALS — BP 130/55 | Ht 65.0 in | Wt 209.0 lb

## 2020-02-07 DIAGNOSIS — I509 Heart failure, unspecified: Secondary | ICD-10-CM

## 2020-02-07 DIAGNOSIS — I493 Ventricular premature depolarization: Secondary | ICD-10-CM

## 2020-02-07 DIAGNOSIS — G4733 Obstructive sleep apnea (adult) (pediatric): Secondary | ICD-10-CM

## 2020-02-07 DIAGNOSIS — I10 Essential (primary) hypertension: Secondary | ICD-10-CM

## 2020-02-07 DIAGNOSIS — R0609 Other forms of dyspnea: Secondary | ICD-10-CM

## 2020-02-07 NOTE — Progress Notes (Signed)
Date:  02/07/2020   ID:  Michaela Baker, DOB 1950/07/13, MRN [id number]  PCP:  [name], FNP  Cardiologist:  Rex Kras, DO, [facility] (established care 02/07/2020)  REASON FOR CONSULT: Congestive heart failure management  REQUESTING PHYSICIAN:  [name], [place],  [place] [address]  Chief Complaint  Patient presents with  . Congestive Heart Failure  . New Patient (Initial Visit)    HPI  Michaela Baker is a 69 y.o. female who presents to the office with a chief complaint of " heart failure management." Patient's past medical history and cardiovascular risk factors include: History of congestive heart failure, hypertension, hypothyroidism, postmenopausal female, advanced age, obesity due to excess calories.  She is referred to the office at the request of [name], FNP for evaluation of congestive heart failure.  Patient states that he was diagnosed with congestive heart failure back in 2013 and was hospitalized at a hospital in [facility].  Since then she has not required hospitalizations for congestive heart failure.  Patient is also states that she does not follow-up with cardiology until recently when she followed up with her PCP shared decision was to establish care with cardiology given her history.  Patient denies any lower extremity swelling, orthopnea, paroxysmal nocturnal dyspnea.  Overall clinically appears euvolemic.  She does have effort related dyspnea with prolonged walking but this is chronic and stable.  Patient states that she is never been evaluated for underlying CAD or recall undergoing echocardiogram.  Denies prior history of coronary artery disease, myocardial infarction, deep venous thrombosis, pulmonary embolism, stroke, transient ischemic attack.  FUNCTIONAL STATUS: Busy with taking care of grandkids. No structured exercise program or daily routine.   ALLERGIES: No Known Allergies  MEDICATION  LIST PRIOR TO VISIT: Current Meds  Medication Sig  . amLODipine (NORVASC) 10 MG tablet Take 10 mg by mouth daily.  Marland Kitchen aspirin EC 81 MG EC tablet Take 1 tablet (81 mg total) by mouth daily.  . dapagliflozin propanediol (FARXIGA) 10 MG TABS tablet Take 10 mg by mouth daily.  . furosemide (LASIX) 40 MG tablet Take 40 mg by mouth.  . hydrALAZINE (APRESOLINE) 50 MG tablet Take 50 mg by mouth daily.  Marland Kitchen levothyroxine (SYNTHROID, LEVOTHROID) 25 MCG tablet Take 25 mcg by mouth daily.  Marland Kitchen lisinopril (PRINIVIL,ZESTRIL) 20 MG tablet Take 20 mg by mouth daily.   . metoprolol succinate (TOPROL-XL) 50 MG 24 hr tablet Take 50 mg by mouth daily. Take with or immediately following a meal.  . Multiple Vitamins-Minerals (MULTIVITAMIN WITH MINERALS) tablet Take 1 tablet by mouth daily.  . naproxen sodium (ALEVE) 220 MG tablet Take 440 mg by mouth daily as needed (pain).  . Omega-3 Fatty Acids (FISH OIL) 1000 MG CAPS Take by mouth.  . potassium chloride (K-DUR,KLOR-CON) 10 MEQ tablet 20 mEq daily.   Marland Kitchen thiamine 100 MG tablet Take 100 mg by mouth daily.  . [DISCONTINUED] HYDROcodone-acetaminophen (NORCO/VICODIN) 5-325 MG tablet Take 1-2 tablets by mouth every 6 (six) hours as needed for moderate pain.     PAST MEDICAL HISTORY: Past Medical History:  Diagnosis Date  . CHF (congestive heart failure) ([place])   . Hypertension   . Obesity (BMI 30-39.9)     PAST SURGICAL HISTORY: Past Surgical History:  Procedure Laterality Date  . ABDOMINAL HYSTERECTOMY    . CHOLECYSTECTOMY    . TONSILLECTOMY    . TUBAL LIGATION      FAMILY  HISTORY: The patient family history includes Diabetes in her brother and mother; Heart failure in her mother; Kidney failure in her mother and sister.  SOCIAL HISTORY:  The patient  reports that she has never smoked. She has never used smokeless tobacco. She reports that she does not drink alcohol and does not use drugs.  REVIEW OF SYSTEMS: Review of Systems  Constitutional: Negative  for chills and fever.  HENT: Negative for hoarse voice and nosebleeds.   Eyes: Negative for discharge, double vision and pain.  Cardiovascular: Positive for dyspnea on exertion. Negative for chest pain, claudication, leg swelling, near-syncope, orthopnea, palpitations, paroxysmal nocturnal dyspnea and syncope.  Respiratory: Negative for hemoptysis and shortness of breath.   Musculoskeletal: Negative for muscle cramps and myalgias.  Gastrointestinal: Negative for abdominal pain, constipation, diarrhea, hematemesis, hematochezia, melena, nausea and vomiting.  Neurological: Negative for dizziness and light-headedness.    PHYSICAL EXAM: Vitals with BMI 02/07/2020 03/30/2017 03/29/2017  Height 5\' 5"  - -  Weight 209 lbs - -  BMI 94.85 - -  Systolic 462 703 500  Diastolic 55 41 90  Pulse - 52 63    CONSTITUTIONAL: Well-developed and well-nourished. No acute distress.  SKIN: Skin is warm and dry. No rash noted. No cyanosis. No pallor. No jaundice HEAD: Normocephalic and atraumatic.  EYES: No scleral icterus MOUTH/THROAT: Moist oral membranes.  NECK: No JVD present. No thyromegaly noted. No carotid bruits  LYMPHATIC: No visible cervical adenopathy.  CHEST Normal respiratory effort. No intercostal retractions  LUNGS: Clear to auscultation bilaterally.  No stridor. No wheezes. No rales.  CARDIOVASCULAR: Regular, positive X3-G1, soft holosystolic murmur heard at the apex, extrasystolic beats, no gallops or rubs. ABDOMINAL: Obese, soft, nontender, nondistended, positive bowel sounds in all 4 quadrants.  No apparent ascites.  EXTREMITIES: No peripheral edema  HEMATOLOGIC: No significant bruising NEUROLOGIC: Oriented to person, place, and time. Nonfocal. Normal muscle tone.  PSYCHIATRIC: Normal mood and affect. Normal behavior. Cooperative  CARDIAC DATABASE: EKG: 02/07/2020: Normal sinus rhythm, 68 bpm, normal axis, nonspecific T wave abnormality, no underlying injury pattern, frequent PVCs.    Echocardiogram: No results found for this or any previous visit from the past 1095 days.  Stress Testing: No results found for this or any previous visit from the past 1095 days.   Heart Catheterization: None  LABORATORY DATA: CBC Latest Ref Rng & Units 03/29/2017 03/28/2017 02/28/2015  WBC 4.0 - 10.5 K/uL 3.5(L) 4.3 4.2  Hemoglobin 12.0 - 15.0 g/dL 15.7(H) 15.0 15.8(H)  Hematocrit 36 - 46 % 47.9(H) 45.2 48.7(H)  Platelets 150 - 400 K/uL 145(L) 169 161    CMP Latest Ref Rng & Units 03/29/2017 03/28/2017 07/26/2013  Glucose 65 - 99 mg/dL 86 110(H) 130(H)  BUN 6 - 20 mg/dL 12 10 26(H)  Creatinine 0.44 - 1.00 mg/dL 0.94 0.96 0.94  Sodium 135 - 145 mmol/L 142 139 136(L)  Potassium 3.5 - 5.1 mmol/L 4.0 3.7 3.9  Chloride 101 - 111 mmol/L 106 106 94(L)  CO2 22 - 32 mmol/L 27 24 32  Calcium 8.9 - 10.3 mg/dL 9.0 8.7(L) 9.6  Total Protein 6.5 - 8.1 g/dL 8.8(H) - -  Total Bilirubin 0.3 - 1.2 mg/dL 0.9 - -  Alkaline Phos 38 - 126 U/L 63 - -  AST 15 - 41 U/L 21 - -  ALT 14 - 54 U/L 15 - -    Lipid Panel  No results found for: CHOL, TRIG, HDL, CHOLHDL, VLDL, LDLCALC, LDLDIRECT, LABVLDL  No components found for: NTPROBNP  No results for input(s): PROBNP in the last 8760 hours. No results for input(s): TSH in the last 8760 hours.  BMP No results for input(s): NA, K, CL, CO2, GLUCOSE, BUN, CREATININE, CALCIUM, GFRNONAA, GFRAA in the last 8760 hours.  HEMOGLOBIN A1C No results found for: HGBA1C, MPG   External labs: Collected: 08/16/2019 Creatinine 0.98 mg/dL. eGFR: 59 mL/min per 1.73 m TSH 0.221  External Labs: Collected: 01/28/2020 BNP 160 Lipid profile: Total cholesterol 138, triglycerides 125, HDL 25, LDL 90 Hemoglobin K2W: 5.8   IMPRESSION:    ICD-10-CM   1. Chronic congestive heart failure, unspecified heart failure type (HCC)  I50.9 EKG 12-Lead    PCV ECHOCARDIOGRAM COMPLETE    PCV MYOCARDIAL PERFUSION WITH LEXISCAN  2. Dyspnea on exertion  R06.00 PCV MYOCARDIAL  PERFUSION WITH LEXISCAN  3. PVC's (premature ventricular contractions)  I49.3 PCV MYOCARDIAL PERFUSION WITH LEXISCAN    LONG TERM MONITOR (3-14 DAYS)  4. Essential hypertension  I10   5. OSA (obstructive sleep apnea)  G47.33      RECOMMENDATIONS: Michaela Baker is a 69 y.o. female whose past medical history and cardiac risk factors include: History of congestive heart failure, hypertension, hypothyroidism, postmenopausal female, advanced age, obesity due to excess calories.  Congestive heart failure, stage C, NYHA class II, unspecified: Plan to obtain echocardiogram to evaluate LVEF and to categorize her underlying congestive heart failure.  I do not have any old records for review at today's office visit. Medications reconciled. No changes in medication until I know if she has HFrEF or HFpEF. Outside labs independently reviewed with her at today's office visit. Clinically appears euvolemic. Recommend daily weight check, strict I/O's Fluid restriction to <2L per day, Na restriction < 2g per day  Dyspnea on exertion chronic and stable: Patient is been attributing her underlying effort later dyspnea to her prior diagnosis of congestive heart failure made in 2013. Patient has not undergone an ischemic evaluation to rule out underlying CAD.  Shared decision was to proceed with stress test which is very appropriate given her other chronic comorbid conditions. Further recommendations to follow  Premature ventricular contractions: Surface EKG noted frequent premature ventricular contractions.  We will plan a 7-day extended Holter monitor to evaluate for PVC burden.  Patient is already on metoprolol will not uptitrate the dose yet until the underlying burden is documented.  Labs reviewed.  Benign essential hypertension: Currently managed by primary care provider.  Obstructive sleep apnea: Patient states that he does have obstructive sleep apnea diagnosed several years ago but does not like wearing  the CPAP.  I have asked her to discuss this further with her PCP in regards to possibly being reevaluated as there are new apparatuses that may help her symptoms.  I will see her in 6 weeks or sooner if needed.  FINAL MEDICATION LIST END OF ENCOUNTER: No orders of the defined types were placed in this encounter.   Medications Discontinued During This Encounter  Medication Reason  . metoprolol succinate (TOPROL-XL) 100 MG 24 hr tablet Error  . HYDROcodone-acetaminophen (NORCO/VICODIN) 5-325 MG tablet Completed Course     Current Outpatient Medications:  .  amLODipine (NORVASC) 10 MG tablet, Take 10 mg by mouth daily., Disp: , Rfl:  .  aspirin EC 81 MG EC tablet, Take 1 tablet (81 mg total) by mouth daily., Disp: 30 tablet, Rfl: 1 .  dapagliflozin propanediol (FARXIGA) 10 MG TABS tablet, Take 10 mg by mouth daily., Disp: , Rfl:  .  furosemide (LASIX) 40  MG tablet, Take 40 mg by mouth., Disp: , Rfl:  .  hydrALAZINE (APRESOLINE) 50 MG tablet, Take 50 mg by mouth daily., Disp: , Rfl:  .  levothyroxine (SYNTHROID, LEVOTHROID) 25 MCG tablet, Take 25 mcg by mouth daily., Disp: , Rfl: 5 .  lisinopril (PRINIVIL,ZESTRIL) 20 MG tablet, Take 20 mg by mouth daily. , Disp: , Rfl:  .  metoprolol succinate (TOPROL-XL) 50 MG 24 hr tablet, Take 50 mg by mouth daily. Take with or immediately following a meal., Disp: , Rfl:  .  Multiple Vitamins-Minerals (MULTIVITAMIN WITH MINERALS) tablet, Take 1 tablet by mouth daily., Disp: , Rfl:  .  naproxen sodium (ALEVE) 220 MG tablet, Take 440 mg by mouth daily as needed (pain)., Disp: , Rfl:  .  Omega-3 Fatty Acids (FISH OIL) 1000 MG CAPS, Take by mouth., Disp: , Rfl:  .  potassium chloride (K-DUR,KLOR-CON) 10 MEQ tablet, 20 mEq daily. , Disp: , Rfl:  .  thiamine 100 MG tablet, Take 100 mg by mouth daily., Disp: , Rfl:   Orders Placed This Encounter  Procedures  . PCV MYOCARDIAL PERFUSION WITH LEXISCAN  . LONG TERM MONITOR (3-14 DAYS)  . EKG 12-Lead  . PCV  ECHOCARDIOGRAM COMPLETE    There are no Patient Instructions on file for this visit.   --Continue cardiac medications as reconciled in final medication list. --Return in about 6 weeks (around 03/20/2020) for heart failure management., Review test results. Or sooner if needed. --Continue follow-up with your primary care physician regarding the management of your other chronic comorbid conditions.  Patient's questions and concerns were addressed to her satisfaction. She voices understanding of the instructions provided during this encounter.   This note was created using a voice recognition software as a result there may be grammatical errors inadvertently enclosed that do not reflect the nature of this encounter. Every attempt is made to correct such errors.  Rex Kras, [place], [facility]  Pager: [phone] Office: [phone]

## 2020-02-17 ENCOUNTER — Ambulatory Visit: Payer: Medicare HMO

## 2020-02-17 ENCOUNTER — Other Ambulatory Visit: Payer: Self-pay

## 2020-02-17 DIAGNOSIS — I493 Ventricular premature depolarization: Secondary | ICD-10-CM

## 2020-02-17 DIAGNOSIS — R0609 Other forms of dyspnea: Secondary | ICD-10-CM

## 2020-02-17 DIAGNOSIS — I509 Heart failure, unspecified: Secondary | ICD-10-CM

## 2020-02-21 ENCOUNTER — Telehealth: Payer: Self-pay

## 2020-02-21 ENCOUNTER — Other Ambulatory Visit: Payer: Self-pay

## 2020-02-21 ENCOUNTER — Ambulatory Visit: Payer: Medicare HMO

## 2020-02-21 DIAGNOSIS — I509 Heart failure, unspecified: Secondary | ICD-10-CM

## 2020-02-21 NOTE — Telephone Encounter (Signed)
Unable to reach patient at time regarding upcoming appt. Voicemail is full.

## 2020-02-21 NOTE — Telephone Encounter (Signed)
2nd attempt to reach pt. Unable to leave vm.

## 2020-02-21 NOTE — Telephone Encounter (Signed)
-----   Message from [place], [place] sent at 02/21/2020  1:34 AM EDT ----- We will review the results at the upcoming office visit.

## 2020-02-28 NOTE — Telephone Encounter (Signed)
Please try again

## 2020-03-02 NOTE — Telephone Encounter (Signed)
Relayed information to patient. Patient will be at upcoming appt.

## 2020-03-20 ENCOUNTER — Ambulatory Visit: Payer: Medicare HMO | Admitting: Cardiology

## 2020-03-24 ENCOUNTER — Other Ambulatory Visit: Payer: Self-pay

## 2020-03-24 ENCOUNTER — Ambulatory Visit: Payer: Medicare HMO | Admitting: Cardiology

## 2020-03-24 ENCOUNTER — Encounter: Payer: Self-pay | Admitting: Cardiology

## 2020-03-24 VITALS — BP 114/59 | HR 50 | Resp 16 | Ht 65.0 in | Wt 208.0 lb

## 2020-03-24 DIAGNOSIS — I493 Ventricular premature depolarization: Secondary | ICD-10-CM

## 2020-03-24 DIAGNOSIS — I10 Essential (primary) hypertension: Secondary | ICD-10-CM

## 2020-03-24 DIAGNOSIS — R0600 Dyspnea, unspecified: Secondary | ICD-10-CM

## 2020-03-24 DIAGNOSIS — R0609 Other forms of dyspnea: Secondary | ICD-10-CM

## 2020-03-24 DIAGNOSIS — G4733 Obstructive sleep apnea (adult) (pediatric): Secondary | ICD-10-CM

## 2020-03-24 NOTE — Progress Notes (Signed)
ID:  Michaela Baker, DOB 1951-02-15, MRN [id number]  PCP:  [name], FNP  Cardiologist:  Rex Kras, DO, [facility] (established care 02/07/2020)  Date: 03/24/2020 Last Office Visit: 02/07/2020  Chief Complaint  Patient presents with  . Congestive Heart Failure  . Follow-up    6 week    HPI  Michaela Baker is a 69 y.o. female who presents to the office with a chief complaint of " heart failure management." Patient's past medical history and cardiovascular risk factors include: History of congestive heart failure, hypertension, hypothyroidism, postmenopausal female, advanced age, obesity due to excess calories.  She is referred to the office at the request of [name], FNP for evaluation of congestive heart failure.  Patient states that she was diagnosed with congestive heart failure back in 2013 and was hospitalized at a hospital in [facility].  Since then she has not required hospitalizations for congestive heart failure.  She reestablished care with PCP to refer to cardiology in September 2021 for management of congestive heart failure.  Since last office visit patient underwent an echocardiogram and stress test.  Echo noted preserved LVEF and normal diastolic filling pattern.  And stress test was reported to be intermediate due to PVCs on stress ECG.  Given her symptoms of shortness of with effort related activities and surface ECG noting premature ventricular contractions she was recommended to undergo extended Holter monitor to evaluate for PVC burden and other dysrhythmias.  Extended Holter results reviewed with the patient at today's office visit including rhythm strips. She is noted to have NSR with with significant PVC burden. There is more than 1 PVC morphology and they occur in predominantly isolated beats but can also occur in either bigeminal or trigeminal patterns as well.  Denies prior history of coronary artery disease, myocardial infarction, deep  venous thrombosis, pulmonary embolism, stroke, transient ischemic attack.  FUNCTIONAL STATUS: Busy with taking care of grandkids. No structured exercise program or daily routine.   ALLERGIES: No Known Allergies  MEDICATION LIST PRIOR TO VISIT: Current Meds  Medication Sig  . amLODipine (NORVASC) 10 MG tablet Take 10 mg by mouth daily.  Marland Kitchen aspirin EC 81 MG EC tablet Take 1 tablet (81 mg total) by mouth daily.  . dapagliflozin propanediol (FARXIGA) 10 MG TABS tablet Take 10 mg by mouth daily.  . furosemide (LASIX) 40 MG tablet Take 40 mg by mouth.  . hydrALAZINE (APRESOLINE) 50 MG tablet Take 50 mg by mouth daily.  Marland Kitchen levothyroxine (SYNTHROID, LEVOTHROID) 25 MCG tablet Take 25 mcg by mouth daily.  Marland Kitchen lisinopril (PRINIVIL,ZESTRIL) 20 MG tablet Take 20 mg by mouth daily.   . metoprolol succinate (TOPROL-XL) 50 MG 24 hr tablet Take 50 mg by mouth daily. Take with or immediately following a meal.  . Multiple Vitamins-Minerals (MULTIVITAMIN WITH MINERALS) tablet Take 1 tablet by mouth daily.  . naproxen sodium (ALEVE) 220 MG tablet Take 440 mg by mouth daily as needed (pain).  . Omega-3 Fatty Acids (FISH OIL) 1000 MG CAPS Take by mouth.  . potassium chloride (K-DUR,KLOR-CON) 10 MEQ tablet 20 mEq daily.   Marland Kitchen thiamine 100 MG tablet Take 100 mg by mouth daily.     PAST MEDICAL HISTORY: Past Medical History:  Diagnosis Date  . CHF (congestive heart failure) ([place])   . Hypertension   . Obesity (BMI 30-39.9)   . Premature ventricular contractions     PAST SURGICAL HISTORY: Past Surgical History:  Procedure Laterality Date  .  ABDOMINAL HYSTERECTOMY    . CHOLECYSTECTOMY    . TONSILLECTOMY    . TUBAL LIGATION      FAMILY HISTORY: The patient family history includes Diabetes in her mother and sister; Heart attack in her sister; Heart failure in her mother; Kidney failure in her mother and sister.  SOCIAL HISTORY:  The patient  reports that she has never smoked. She has never used smokeless  tobacco. She reports that she does not drink alcohol and does not use drugs.  REVIEW OF SYSTEMS: Review of Systems  Constitutional: Negative for chills and fever.  HENT: Negative for hoarse voice and nosebleeds.   Eyes: Negative for discharge, double vision and pain.  Cardiovascular: Positive for dyspnea on exertion. Negative for chest pain, claudication, leg swelling, near-syncope, orthopnea, palpitations, paroxysmal nocturnal dyspnea and syncope.  Respiratory: Negative for hemoptysis and shortness of breath.   Musculoskeletal: Negative for muscle cramps and myalgias.  Gastrointestinal: Negative for abdominal pain, constipation, diarrhea, hematemesis, hematochezia, melena, nausea and vomiting.  Neurological: Negative for dizziness and light-headedness.    PHYSICAL EXAM: Vitals with BMI 03/24/2020 02/07/2020 03/30/2017  Height $Remov'5\' 5"'dUewzX$  $Remove'5\' 5"'RCPPLno$  -  Weight 208 lbs 209 lbs -  BMI 56.97 94.80 -  Systolic 165 537 482  Diastolic 59 55 41  Pulse 50 - 52    CONSTITUTIONAL: Well-developed and well-nourished. No acute distress.  SKIN: Skin is warm and dry. No rash noted. No cyanosis. No pallor. No jaundice HEAD: Normocephalic and atraumatic.  EYES: No scleral icterus MOUTH/THROAT: Moist oral membranes.  NECK: No JVD present. No thyromegaly noted. No carotid bruits  LYMPHATIC: No visible cervical adenopathy.  CHEST Normal respiratory effort. No intercostal retractions  LUNGS: Clear to auscultation bilaterally.  No stridor. No wheezes. No rales.  CARDIOVASCULAR: Regular, positive L0-B8, soft holosystolic murmur heard at the apex, extrasystolic beats, no gallops or rubs. ABDOMINAL: Obese, soft, nontender, nondistended, positive bowel sounds in all 4 quadrants.  No apparent ascites.  EXTREMITIES: No peripheral edema  HEMATOLOGIC: No significant bruising NEUROLOGIC: Oriented to person, place, and time. Nonfocal. Normal muscle tone.  PSYCHIATRIC: Normal mood and affect. Normal behavior.  Cooperative  CARDIAC DATABASE: EKG: 02/07/2020: Normal sinus rhythm, 68 bpm, normal axis, nonspecific T wave abnormality, no underlying injury pattern, frequent PVCs.   Echocardiogram: 02/21/2020:  Left ventricle cavity is normal in size and wall thickness. Normal global wall motion. Normal LV systolic function with EF 62%. Normal diastolic filling pattern.  Right atrial cavity is moderately dilated. Moderate tricuspid regurgitation. Estimated pulmonary artery systolic pressure 38 mmHg.   Stress Testing: Lexiscan Tetrofosmin stress test 02/17/2020:  Lexiscan/modified Bruce nuclear stress test performed using 1-day protocol. Stress EKG is non-diagnostic, as this is pharmacological stress test. In addition, stress EKG at 91% MPHR demonstrated normal sinus rhythm. No ST-T wave abnormalities. During infusion the peak ECG revealed premature ventricular contractions, ventricular bigeminy.   Normal myocardial perfusion. Stress LVEF 58%.  Intermediate risk study due to ventricular bigeminy on stress EKG.   Heart Catheterization: None  7 day extended Holter monitor: Dominant rhythm normal sinus.  Heart rate 51 -128 bpm.  Avg HR 69 bpm. No atrial fibrillation, supraventricular tachycardia, ventricular tachycardia, high grade AV block, pauses (3 seconds or longer). Total ventricular ectopic burden ~22.5% (20.1% as isolated beats, 2.4% ventricular couplets, and <1% triplets).  Duration of the longest bigeminy event 18 minutes 27 seconds.  Duration of the longest trigeminy event 2 minutes and 28 seconds.  Total supraventricular ectopic burden <1%. Patient triggered events: 4.  Underlying rhythm sinus with either isolated PVCs or PVCs in bigeminal pattern.   LABORATORY DATA: CBC Latest Ref Rng & Units 03/29/2017 03/28/2017 02/28/2015  WBC 4.0 - 10.5 K/uL 3.5(L) 4.3 4.2  Hemoglobin 12.0 - 15.0 g/dL 15.7(H) 15.0 15.8(H)  Hematocrit 36 - 46 % 47.9(H) 45.2 48.7(H)  Platelets 150 - 400 K/uL 145(L) 169 161     CMP Latest Ref Rng & Units 03/29/2017 03/28/2017 07/26/2013  Glucose 65 - 99 mg/dL 86 110(H) 130(H)  BUN 6 - 20 mg/dL 12 10 26(H)  Creatinine 0.44 - 1.00 mg/dL 0.94 0.96 0.94  Sodium 135 - 145 mmol/L 142 139 136(L)  Potassium 3.5 - 5.1 mmol/L 4.0 3.7 3.9  Chloride 101 - 111 mmol/L 106 106 94(L)  CO2 22 - 32 mmol/L 27 24 32  Calcium 8.9 - 10.3 mg/dL 9.0 8.7(L) 9.6  Total Protein 6.5 - 8.1 g/dL 8.8(H) - -  Total Bilirubin 0.3 - 1.2 mg/dL 0.9 - -  Alkaline Phos 38 - 126 U/L 63 - -  AST 15 - 41 U/L 21 - -  ALT 14 - 54 U/L 15 - -    Lipid Panel  No results found for: CHOL, TRIG, HDL, CHOLHDL, VLDL, LDLCALC, LDLDIRECT, LABVLDL  No components found for: NTPROBNP No results for input(s): PROBNP in the last 8760 hours. No results for input(s): TSH in the last 8760 hours.  BMP No results for input(s): NA, K, CL, CO2, GLUCOSE, BUN, CREATININE, CALCIUM, GFRNONAA, GFRAA in the last 8760 hours.  HEMOGLOBIN A1C No results found for: HGBA1C, MPG   External labs: Collected: 08/16/2019 Creatinine 0.98 mg/dL. eGFR: 59 mL/min per 1.73 m TSH 0.221  External Labs: Collected: 01/28/2020 BNP 160 Lipid profile: Total cholesterol 138, triglycerides 125, HDL 25, LDL 90 Hemoglobin A1c: 5.8   IMPRESSION:    ICD-10-CM   1. PVC's (premature ventricular contractions)  I49.3 Ambulatory referral to Cardiology  2. Dyspnea on exertion  R06.00   3. Essential hypertension  I10   4. OSA (obstructive sleep apnea)  G47.33      RECOMMENDATIONS: Michaela Baker is a 69 y.o. female whose past medical history and cardiac risk factors include: History of congestive heart failure, hypertension, hypothyroidism, postmenopausal female, advanced age, obesity due to excess calories.  Patient carries a history of congestive heart failure but based on most recent echocardiogram her LVEF is preserved and normal diastolic function.  She is currently euvolemic on physical examination and no recent hospitalizations  for congestive heart failure or cardiovascular symptoms.  Patient is currently on ACE inhibitor's, Lasix, Farxiga, Toprol-XL.  Patient underlying dyspnea on exertion which is chronic and stable most likely is due to her elevated PVC burden despite being on beta-blocker therapy.  Given her symptoms of effort related dyspnea and elevated PVC burden shared decision was to proceed with EP evaluation to consider either antiarrhythmic medications versus PVC ablation.  Will refer to Dr. Thompson Grayer.  Benign essential hypertension: Currently managed by primary care provider.  FINAL MEDICATION LIST END OF ENCOUNTER: No orders of the defined types were placed in this encounter.   There are no discontinued medications.   Current Outpatient Medications:  .  amLODipine (NORVASC) 10 MG tablet, Take 10 mg by mouth daily., Disp: , Rfl:  .  aspirin EC 81 MG EC tablet, Take 1 tablet (81 mg total) by mouth daily., Disp: 30 tablet, Rfl: 1 .  dapagliflozin propanediol (FARXIGA) 10 MG TABS tablet, Take 10 mg by mouth daily., Disp: , Rfl:  .  furosemide (LASIX) 40 MG tablet, Take 40 mg by mouth., Disp: , Rfl:  .  hydrALAZINE (APRESOLINE) 50 MG tablet, Take 50 mg by mouth daily., Disp: , Rfl:  .  levothyroxine (SYNTHROID, LEVOTHROID) 25 MCG tablet, Take 25 mcg by mouth daily., Disp: , Rfl: 5 .  lisinopril (PRINIVIL,ZESTRIL) 20 MG tablet, Take 20 mg by mouth daily. , Disp: , Rfl:  .  metoprolol succinate (TOPROL-XL) 50 MG 24 hr tablet, Take 50 mg by mouth daily. Take with or immediately following a meal., Disp: , Rfl:  .  Multiple Vitamins-Minerals (MULTIVITAMIN WITH MINERALS) tablet, Take 1 tablet by mouth daily., Disp: , Rfl:  .  naproxen sodium (ALEVE) 220 MG tablet, Take 440 mg by mouth daily as needed (pain)., Disp: , Rfl:  .  Omega-3 Fatty Acids (FISH OIL) 1000 MG CAPS, Take by mouth., Disp: , Rfl:  .  potassium chloride (K-DUR,KLOR-CON) 10 MEQ tablet, 20 mEq daily. , Disp: , Rfl:  .  thiamine 100 MG tablet,  Take 100 mg by mouth daily., Disp: , Rfl:   Orders Placed This Encounter  Procedures  . Ambulatory referral to Cardiology    There are no Patient Instructions on file for this visit.   --Continue cardiac medications as reconciled in final medication list. --Return in about 3 months (around 06/24/2020) for Reevaluation of PVCs, follow up after EP evaluation. Or sooner if needed. --Continue follow-up with your primary care physician regarding the management of your other chronic comorbid conditions.  Patient's questions and concerns were addressed to her satisfaction. She voices understanding of the instructions provided during this encounter.   This note was created using a voice recognition software as a result there may be grammatical errors inadvertently enclosed that do not reflect the nature of this encounter. Every attempt is made to correct such errors.  Rex Kras, [place], [facility]  Pager: [phone] Office: [phone]

## 2020-04-09 ENCOUNTER — Encounter: Payer: Self-pay | Admitting: Internal Medicine

## 2020-04-09 ENCOUNTER — Other Ambulatory Visit: Payer: Self-pay

## 2020-04-09 ENCOUNTER — Ambulatory Visit: Payer: Medicare HMO | Admitting: Internal Medicine

## 2020-04-09 VITALS — BP 130/62 | HR 63 | Ht 65.0 in | Wt 206.8 lb

## 2020-04-09 DIAGNOSIS — R0600 Dyspnea, unspecified: Secondary | ICD-10-CM

## 2020-04-09 DIAGNOSIS — I493 Ventricular premature depolarization: Secondary | ICD-10-CM | POA: Diagnosis not present

## 2020-04-09 DIAGNOSIS — R0609 Other forms of dyspnea: Secondary | ICD-10-CM

## 2020-04-09 DIAGNOSIS — G4733 Obstructive sleep apnea (adult) (pediatric): Secondary | ICD-10-CM

## 2020-04-09 DIAGNOSIS — Z23 Encounter for immunization: Secondary | ICD-10-CM

## 2020-04-09 DIAGNOSIS — I10 Essential (primary) hypertension: Secondary | ICD-10-CM | POA: Diagnosis not present

## 2020-04-09 NOTE — Progress Notes (Signed)
Electrophysiology Office Note   Date:  04/09/2020   ID:  Michaela Baker, DOB March 18, 1951, MRN [id number]  PCP:  Michaela Drafts, FNP  Cardiologist:  Dr Terri Skains Primary Electrophysiologist: Thompson Grayer, MD    CC: PVCs   History of Present Illness: Michaela Baker is a 69 y.o. female who presents today for electrophysiology evaluation.   She is referred by Dr Terri Skains for EP consultation regarding PVCs.  She has obesity but has worked on weight loss and has lost close to 50 lbs.  She has OSA but is not compliant with CPAP. She has SOB with activity which is multifactoral. Recent event monitor reveals PVCs (burden 20%).  EF is preserved however she has elevated RV pressure with moderate TR.Marland Kitchen She is not very active. Today, she denies symptoms of palpitations, chest pain,  orthopnea, PND, lower extremity edema, claudication, dizziness, presyncope, syncope, bleeding, or neurologic sequela. The patient is tolerating medications without difficulties and is otherwise without complaint today.    Past Medical History:  Diagnosis Date  . CHF (congestive heart failure) ([place])   . Hypertension   . Obesity (BMI 30-39.9)   . Premature ventricular contractions    Past Surgical History:  Procedure Laterality Date  . ABDOMINAL HYSTERECTOMY    . CHOLECYSTECTOMY    . TONSILLECTOMY    . TUBAL LIGATION       Current Outpatient Medications  Medication Sig Dispense Refill  . amLODipine (NORVASC) 10 MG tablet Take 10 mg by mouth daily.    Marland Kitchen aspirin EC 81 MG EC tablet Take 1 tablet (81 mg total) by mouth daily. 30 tablet 1  . dapagliflozin propanediol (FARXIGA) 10 MG TABS tablet Take 10 mg by mouth daily.    . furosemide (LASIX) 40 MG tablet Take 40 mg by mouth.    . hydrALAZINE (APRESOLINE) 50 MG tablet Take 50 mg by mouth daily.    Marland Kitchen levothyroxine (SYNTHROID, LEVOTHROID) 25 MCG tablet Take 25 mcg by mouth daily.  5  . lisinopril (PRINIVIL,ZESTRIL) 20 MG tablet Take 20 mg by mouth daily.     .  metoprolol succinate (TOPROL-XL) 50 MG 24 hr tablet Take 50 mg by mouth daily. Take with or immediately following a meal.    . Multiple Vitamins-Minerals (MULTIVITAMIN WITH MINERALS) tablet Take 1 tablet by mouth daily.    . naproxen sodium (ALEVE) 220 MG tablet Take 440 mg by mouth daily as needed (pain).    . Omega-3 Fatty Acids (FISH OIL) 1000 MG CAPS Take by mouth.    . potassium chloride (K-DUR,KLOR-CON) 10 MEQ tablet 20 mEq daily.     Marland Kitchen thiamine 100 MG tablet Take 100 mg by mouth daily.     No current facility-administered medications for this visit.    Allergies:   Patient has no known allergies.   Social History:  The patient  reports that she has never smoked. She has never used smokeless tobacco. She reports that she does not drink alcohol and does not use drugs.   Family History:  The patient's  family history includes Diabetes in her mother and sister; Heart attack in her sister; Heart failure in her mother; Kidney failure in her mother and sister.    ROS:  Please see the history of present illness.   All other systems are personally reviewed and negative.    PHYSICAL EXAM: VS:  BP 130/62   Pulse 63   Ht 5\' 5"  (1.651 m)   Wt 206 lb 12.8 oz (93.8  kg)   SpO2 90%   BMI 34.41 kg/m  , BMI Body mass index is 34.41 kg/m. GEN: obese, in no acute distress HEENT: normal Neck: no JVD, carotid bruits, or masses Cardiac: RRR; no murmurs, rubs, or gallops,no edema  Respiratory:  clear to auscultation bilaterally, normal work of breathing GI: soft, nontender, nondistended, + BS MS: no deformity or atrophy Skin: warm and dry  Neuro:  Strength and sensation are intact Psych: euthymic mood, full affect  EKG:  EKG is ordered today. The ekg ordered today is personally reviewed and shows sinus rhythm with first degree AV block    Wt Readings from Last 3 Encounters:  04/09/20 206 lb 12.8 oz (93.8 kg)  03/24/20 208 lb (94.3 kg)  02/07/20 209 lb (94.8 kg)    Echo 02/21/20- EF  62%, moderate RA enlargement, moderate TR, PAS 38 mm Hg  Other studies personally reviewed: Additional studies/ records that were reviewed today include: Dr William Dalton notes, prior echo, recent event monitor  Review of the above records today demonstrates: as above   ASSESSMENT AND PLAN:  1.  SOB multifactorial She has morbid obesity and is not very active.  Also likely has diastolic dysfunction due to dietary sodium intake.  We discussed these at length. She also has untreated longstanding OSA and likely has at least moderate pulmonary hypertension. I am not certain that her PVCs are responsible for her symptoms. We discussed importance of sodium restriction, regular weight loss, and increased exercise today.  2. PVCs No PVCs on exam today or by EKG. I am not convinced that she is very symptomatic from this.  EF is preserved I would advise treatment of pulmonary htn/ RV enlargement with weight loss and compliance with CPAP  (as above) which may reduce her PVC burden. She does not wish to consider AADs or ablation.  I would not advise ablation and am not convinced that AAD would be clinically beneficial.  3. OSA I have advised CPAP utilization She declines  4. Morbid obesity Body mass index is 34.41 kg/m. As above  5. HTN Stable No change required today  6. Chronic diastolic dysfunction Sodium restriction and lifestyle modification advised  Risks, benefits and potential toxicities for medications prescribed and/or refilled reviewed with patient today.    Follow-up:  Return as needed Follow-up with Dr Terri Skains as scheduled  Current medicines are reviewed at length with the patient today.   The patient does not have concerns regarding her medicines.  The following changes were made today:  none  Labs/ tests ordered today include:  Orders Placed This Encounter  Procedures  . Flu Vaccine QUAD High Dose(Fluad)     Signed, Thompson Grayer, MD  04/09/2020 9:47 AM     [facility]  [facility] [address] [place] [address] [phone] (office) [phone] (fax)

## 2020-04-09 NOTE — Patient Instructions (Addendum)
Medication Instructions:  Your physician recommends that you continue on your current medications as directed. Please refer to the Current Medication list given to you today.  *If you need a refill on your cardiac medications before your next appointment, please call your pharmacy*  Lab Work: None ordered.  If you have labs (blood work) drawn today and your tests are completely normal, you will receive your results only by: Marland Kitchen MyChart Message (if you have MyChart) OR . A paper copy in the mail If you have any lab test that is abnormal or we need to change your treatment, we will call you to review the results.  Testing/Procedures: None ordered.  Follow-Up: At [facility], you and your health needs are our priority.  As part of our continuing mission to provide you with exceptional heart care, we have created designated Provider Care Teams.  These Care Teams include your primary Cardiologist (physician) and Advanced Practice Providers (APPs -  Physician Assistants and Nurse Practitioners) who all work together to provide you with the care you need, when you need it.  We recommend signing up for the patient portal called "MyChart".  Sign up information is provided on this After Visit Summary.  MyChart is used to connect with patients for Virtual Visits (Telemedicine).  Patients are able to view lab/test results, encounter notes, upcoming appointments, etc.  Non-urgent messages can be sent to your provider as well.   To learn more about what you can do with MyChart, go to [email or web address].    Your next appointment:   Your physician wants you to follow-up in: As needed with Dr. Rayann Heman. You will receive a reminder letter in the mail two months in advance. If you don't receive a letter, please call our office to schedule the follow-up appointment.   Other Instructions:  DASH Eating Plan DASH stands for "Dietary Approaches to Stop Hypertension." The DASH eating plan is a healthy eating  plan that has been shown to reduce high blood pressure (hypertension). It may also reduce your risk for type 2 diabetes, heart disease, and stroke. The DASH eating plan may also help with weight loss. What are tips for following this plan?  General guidelines  Avoid eating more than 2,300 mg (milligrams) of salt (sodium) a day. If you have hypertension, you may need to reduce your sodium intake to 1,500 mg a day.  Limit alcohol intake to no more than 1 drink a day for nonpregnant women and 2 drinks a day for men. One drink equals 12 oz of beer, 5 oz of wine, or 1 oz of hard liquor.  Work with your health care provider to maintain a healthy body weight or to lose weight. Ask what an ideal weight is for you.  Get at least 30 minutes of exercise that causes your heart to beat faster (aerobic exercise) most days of the week. Activities may include walking, swimming, or biking.  Work with your health care provider or diet and nutrition specialist (dietitian) to adjust your eating plan to your individual calorie needs. Reading food labels   Check food labels for the amount of sodium per serving. Choose foods with less than 5 percent of the Daily Value of sodium. Generally, foods with less than 300 mg of sodium per serving fit into this eating plan.  To find whole grains, look for the word "whole" as the first word in the ingredient list. Shopping  Buy products labeled as "low-sodium" or "no salt added."  Buy fresh foods. Avoid  canned foods and premade or frozen meals. Cooking  Avoid adding salt when cooking. Use salt-free seasonings or herbs instead of table salt or sea salt. Check with your health care provider or pharmacist before using salt substitutes.  Do not fry foods. Cook foods using healthy methods such as baking, boiling, grilling, and broiling instead.  Cook with heart-healthy oils, such as olive, canola, soybean, or sunflower oil. Meal planning  Eat a balanced diet that  includes: ? 5 or more servings of fruits and vegetables each day. At each meal, try to fill half of your plate with fruits and vegetables. ? Up to 6-8 servings of whole grains each day. ? Less than 6 oz of lean meat, poultry, or fish each day. A 3-oz serving of meat is about the same size as a deck of cards. One egg equals 1 oz. ? 2 servings of low-fat dairy each day. ? A serving of nuts, seeds, or beans 5 times each week. ? Heart-healthy fats. Healthy fats called Omega-3 fatty acids are found in foods such as flaxseeds and coldwater fish, like sardines, salmon, and mackerel.  Limit how much you eat of the following: ? Canned or prepackaged foods. ? Food that is high in trans fat, such as fried foods. ? Food that is high in saturated fat, such as fatty meat. ? Sweets, desserts, sugary drinks, and other foods with added sugar. ? Full-fat dairy products.  Do not salt foods before eating.  Try to eat at least 2 vegetarian meals each week.  Eat more home-cooked food and less restaurant, buffet, and fast food.  When eating at a restaurant, ask that your food be prepared with less salt or no salt, if possible. What foods are recommended? The items listed may not be a complete list. Talk with your dietitian about what dietary choices are best for you. Grains Whole-grain or whole-wheat bread. Whole-grain or whole-wheat pasta. Brown rice. [place]. Bulgur. Whole-grain and low-sodium cereals. Pita bread. Low-fat, low-sodium crackers. Whole-wheat flour tortillas. Vegetables Fresh or frozen vegetables (raw, steamed, roasted, or grilled). Low-sodium or reduced-sodium tomato and vegetable juice. Low-sodium or reduced-sodium tomato sauce and tomato paste. Low-sodium or reduced-sodium canned vegetables. Fruits All fresh, dried, or frozen fruit. Canned fruit in natural juice (without added sugar). Meat and other protein foods Skinless chicken or [place]. Ground chicken or [place]. Pork with fat  trimmed off. Fish and seafood. Egg whites. Dried beans, peas, or lentils. Unsalted nuts, nut butters, and seeds. Unsalted canned beans. Lean cuts of beef with fat trimmed off. Low-sodium, lean deli meat. Dairy Low-fat (1%) or fat-free (skim) milk. Fat-free, low-fat, or reduced-fat cheeses. Nonfat, low-sodium ricotta or cottage cheese. Low-fat or nonfat yogurt. Low-fat, low-sodium cheese. Fats and oils Soft margarine without trans fats. Vegetable oil. Low-fat, reduced-fat, or light mayonnaise and salad dressings (reduced-sodium). Canola, safflower, olive, soybean, and sunflower oils. Avocado. Seasoning and other foods Herbs. Spices. Seasoning mixes without salt. Unsalted popcorn and pretzels. Fat-free sweets. What foods are not recommended? The items listed may not be a complete list. Talk with your dietitian about what dietary choices are best for you. Grains Baked goods made with fat, such as croissants, muffins, or some breads. Dry pasta or rice meal packs. Vegetables Creamed or fried vegetables. Vegetables in a cheese sauce. Regular canned vegetables (not low-sodium or reduced-sodium). Regular canned tomato sauce and paste (not low-sodium or reduced-sodium). Regular tomato and vegetable juice (not low-sodium or reduced-sodium). Angie Fava. Olives. Fruits Canned fruit in a light or heavy syrup.  Fried fruit. Fruit in cream or butter sauce. Meat and other protein foods Fatty cuts of meat. Ribs. Fried meat. Berniece Salines. Sausage. Bologna and other processed lunch meats. Salami. Fatback. Hotdogs. Bratwurst. Salted nuts and seeds. Canned beans with added salt. Canned or smoked fish. Whole eggs or egg yolks. Chicken or [place] with skin. Dairy Whole or 2% milk, cream, and half-and-half. Whole or full-fat cream cheese. Whole-fat or sweetened yogurt. Full-fat cheese. Nondairy creamers. Whipped toppings. Processed cheese and cheese spreads. Fats and oils Butter. Stick margarine. Lard. Shortening. Ghee. Bacon fat.  Tropical oils, such as coconut, palm kernel, or palm oil. Seasoning and other foods Salted popcorn and pretzels. Onion salt, garlic salt, seasoned salt, table salt, and sea salt. Worcestershire sauce. Tartar sauce. Barbecue sauce. Teriyaki sauce. Soy sauce, including reduced-sodium. Steak sauce. Canned and packaged gravies. Fish sauce. Oyster sauce. Cocktail sauce. Horseradish that you find on the shelf. Ketchup. Mustard. Meat flavorings and tenderizers. Bouillon cubes. Hot sauce and Tabasco sauce. Premade or packaged marinades. Premade or packaged taco seasonings. Relishes. Regular salad dressings. Where to find more information:  National Heart, Lung, and [place]: https://wilson-eaton.com/  American Heart Association: www.heart.org Summary  The DASH eating plan is a healthy eating plan that has been shown to reduce high blood pressure (hypertension). It may also reduce your risk for type 2 diabetes, heart disease, and stroke.  With the DASH eating plan, you should limit salt (sodium) intake to 2,300 mg a day. If you have hypertension, you may need to reduce your sodium intake to 1,500 mg a day.  When on the DASH eating plan, aim to eat more fresh fruits and vegetables, whole grains, lean proteins, low-fat dairy, and heart-healthy fats.  Work with your health care provider or diet and nutrition specialist (dietitian) to adjust your eating plan to your individual calorie needs. This information is not intended to replace advice given to you by your health care provider. Make sure you discuss any questions you have with your health care provider. Document Revised: 04/28/2017 Document Reviewed: 05/09/2016 Elsevier Patient Education  2020 [employer].

## 2020-06-23 ENCOUNTER — Ambulatory Visit: Payer: Medicare HMO | Admitting: Cardiology

## 2020-08-07 ENCOUNTER — Other Ambulatory Visit: Payer: Self-pay | Admitting: Nurse Practitioner

## 2020-08-07 DIAGNOSIS — Z1231 Encounter for screening mammogram for malignant neoplasm of breast: Secondary | ICD-10-CM

## 2020-10-01 ENCOUNTER — Ambulatory Visit: Payer: Medicare HMO

## 2020-10-15 ENCOUNTER — Other Ambulatory Visit: Payer: Self-pay

## 2020-10-15 ENCOUNTER — Ambulatory Visit
Admission: RE | Admit: 2020-10-15 | Discharge: 2020-10-15 | Disposition: A | Discharge status: Home / Self Care | Payer: Medicare Other | Source: Ambulatory Visit | Attending: Nurse Practitioner | Admitting: Nurse Practitioner

## 2020-10-15 DIAGNOSIS — Z1231 Encounter for screening mammogram for malignant neoplasm of breast: Secondary | ICD-10-CM

## 2020-11-27 DIAGNOSIS — I509 Heart failure, unspecified: Secondary | ICD-10-CM | POA: Diagnosis not present

## 2020-11-27 DIAGNOSIS — M954 Acquired deformity of chest and rib: Secondary | ICD-10-CM | POA: Diagnosis not present

## 2020-11-27 DIAGNOSIS — E039 Hypothyroidism, unspecified: Secondary | ICD-10-CM | POA: Diagnosis not present

## 2020-11-27 DIAGNOSIS — E119 Type 2 diabetes mellitus without complications: Secondary | ICD-10-CM | POA: Diagnosis not present

## 2020-11-27 DIAGNOSIS — Z28310 Unvaccinated for covid-19: Secondary | ICD-10-CM | POA: Diagnosis not present

## 2020-11-27 DIAGNOSIS — Z23 Encounter for immunization: Secondary | ICD-10-CM | POA: Diagnosis not present

## 2020-11-27 DIAGNOSIS — I10 Essential (primary) hypertension: Secondary | ICD-10-CM | POA: Diagnosis not present

## 2020-11-27 DIAGNOSIS — Z0000 Encounter for general adult medical examination without abnormal findings: Secondary | ICD-10-CM | POA: Diagnosis not present

## 2021-03-03 ENCOUNTER — Other Ambulatory Visit: Payer: Self-pay

## 2021-03-03 ENCOUNTER — Observation Stay (HOSPITAL_BASED_OUTPATIENT_CLINIC_OR_DEPARTMENT_OTHER): Payer: Medicare Other

## 2021-03-03 ENCOUNTER — Observation Stay (HOSPITAL_BASED_OUTPATIENT_CLINIC_OR_DEPARTMENT_OTHER)
Admit: 2021-03-03 | Discharge: 2021-03-03 | Disposition: A | Discharge status: Home / Self Care | Payer: Medicare Other | Attending: Emergency Medicine | Admitting: Emergency Medicine

## 2021-03-03 ENCOUNTER — Emergency Department (HOSPITAL_COMMUNITY): Payer: Medicare Other

## 2021-03-03 ENCOUNTER — Encounter (HOSPITAL_COMMUNITY): Payer: Self-pay

## 2021-03-03 ENCOUNTER — Inpatient Hospital Stay (HOSPITAL_COMMUNITY)
Admission: EM | Admit: 2021-03-03 | Discharge: 2021-03-16 | DRG: 871 | Disposition: A | Discharge status: Home Health | Payer: Medicare Other | Attending: Family Medicine | Admitting: Family Medicine

## 2021-03-03 DIAGNOSIS — I10 Essential (primary) hypertension: Secondary | ICD-10-CM | POA: Diagnosis present

## 2021-03-03 DIAGNOSIS — Z20822 Contact with and (suspected) exposure to covid-19: Secondary | ICD-10-CM | POA: Diagnosis not present

## 2021-03-03 DIAGNOSIS — T501X5A Adverse effect of loop [high-ceiling] diuretics, initial encounter: Secondary | ICD-10-CM | POA: Diagnosis present

## 2021-03-03 DIAGNOSIS — M25551 Pain in right hip: Secondary | ICD-10-CM | POA: Diagnosis not present

## 2021-03-03 DIAGNOSIS — Z9181 History of falling: Secondary | ICD-10-CM

## 2021-03-03 DIAGNOSIS — Z7984 Long term (current) use of oral hypoglycemic drugs: Secondary | ICD-10-CM

## 2021-03-03 DIAGNOSIS — R519 Headache, unspecified: Secondary | ICD-10-CM | POA: Diagnosis not present

## 2021-03-03 DIAGNOSIS — Z7982 Long term (current) use of aspirin: Secondary | ICD-10-CM

## 2021-03-03 DIAGNOSIS — I110 Hypertensive heart disease with heart failure: Secondary | ICD-10-CM | POA: Diagnosis present

## 2021-03-03 DIAGNOSIS — Z833 Family history of diabetes mellitus: Secondary | ICD-10-CM | POA: Diagnosis not present

## 2021-03-03 DIAGNOSIS — A419 Sepsis, unspecified organism: Secondary | ICD-10-CM | POA: Diagnosis not present

## 2021-03-03 DIAGNOSIS — R5381 Other malaise: Secondary | ICD-10-CM | POA: Diagnosis not present

## 2021-03-03 DIAGNOSIS — M6282 Rhabdomyolysis: Secondary | ICD-10-CM | POA: Diagnosis present

## 2021-03-03 DIAGNOSIS — E1151 Type 2 diabetes mellitus with diabetic peripheral angiopathy without gangrene: Secondary | ICD-10-CM | POA: Diagnosis present

## 2021-03-03 DIAGNOSIS — R079 Chest pain, unspecified: Secondary | ICD-10-CM | POA: Diagnosis not present

## 2021-03-03 DIAGNOSIS — Z8249 Family history of ischemic heart disease and other diseases of the circulatory system: Secondary | ICD-10-CM

## 2021-03-03 DIAGNOSIS — Z841 Family history of disorders of kidney and ureter: Secondary | ICD-10-CM

## 2021-03-03 DIAGNOSIS — R0902 Hypoxemia: Secondary | ICD-10-CM

## 2021-03-03 DIAGNOSIS — E669 Obesity, unspecified: Secondary | ICD-10-CM | POA: Diagnosis not present

## 2021-03-03 DIAGNOSIS — Z6834 Body mass index (BMI) 34.0-34.9, adult: Secondary | ICD-10-CM

## 2021-03-03 DIAGNOSIS — W19XXXA Unspecified fall, initial encounter: Secondary | ICD-10-CM

## 2021-03-03 DIAGNOSIS — Z79899 Other long term (current) drug therapy: Secondary | ICD-10-CM | POA: Diagnosis not present

## 2021-03-03 DIAGNOSIS — G9341 Metabolic encephalopathy: Secondary | ICD-10-CM | POA: Diagnosis not present

## 2021-03-03 DIAGNOSIS — Z79890 Hormone replacement therapy (postmenopausal): Secondary | ICD-10-CM

## 2021-03-03 DIAGNOSIS — M7989 Other specified soft tissue disorders: Secondary | ICD-10-CM | POA: Diagnosis not present

## 2021-03-03 DIAGNOSIS — I739 Peripheral vascular disease, unspecified: Secondary | ICD-10-CM | POA: Diagnosis not present

## 2021-03-03 DIAGNOSIS — E861 Hypovolemia: Secondary | ICD-10-CM | POA: Diagnosis present

## 2021-03-03 DIAGNOSIS — L03116 Cellulitis of left lower limb: Secondary | ICD-10-CM | POA: Diagnosis not present

## 2021-03-03 DIAGNOSIS — J9601 Acute respiratory failure with hypoxia: Secondary | ICD-10-CM | POA: Diagnosis not present

## 2021-03-03 DIAGNOSIS — R7989 Other specified abnormal findings of blood chemistry: Secondary | ICD-10-CM | POA: Diagnosis present

## 2021-03-03 DIAGNOSIS — M79671 Pain in right foot: Secondary | ICD-10-CM | POA: Diagnosis not present

## 2021-03-03 DIAGNOSIS — E860 Dehydration: Secondary | ICD-10-CM | POA: Diagnosis present

## 2021-03-03 DIAGNOSIS — L03115 Cellulitis of right lower limb: Secondary | ICD-10-CM | POA: Diagnosis not present

## 2021-03-03 DIAGNOSIS — N179 Acute kidney failure, unspecified: Secondary | ICD-10-CM | POA: Diagnosis not present

## 2021-03-03 DIAGNOSIS — R000 Tachycardia, unspecified: Secondary | ICD-10-CM | POA: Diagnosis not present

## 2021-03-03 DIAGNOSIS — G4733 Obstructive sleep apnea (adult) (pediatric): Secondary | ICD-10-CM | POA: Diagnosis present

## 2021-03-03 DIAGNOSIS — S0990XA Unspecified injury of head, initial encounter: Secondary | ICD-10-CM | POA: Diagnosis not present

## 2021-03-03 DIAGNOSIS — E871 Hypo-osmolality and hyponatremia: Secondary | ICD-10-CM | POA: Diagnosis present

## 2021-03-03 DIAGNOSIS — E039 Hypothyroidism, unspecified: Secondary | ICD-10-CM | POA: Diagnosis present

## 2021-03-03 DIAGNOSIS — R339 Retention of urine, unspecified: Secondary | ICD-10-CM | POA: Diagnosis not present

## 2021-03-03 DIAGNOSIS — I509 Heart failure, unspecified: Secondary | ICD-10-CM | POA: Diagnosis not present

## 2021-03-03 DIAGNOSIS — R0989 Other specified symptoms and signs involving the circulatory and respiratory systems: Secondary | ICD-10-CM

## 2021-03-03 DIAGNOSIS — J189 Pneumonia, unspecified organism: Secondary | ICD-10-CM | POA: Diagnosis present

## 2021-03-03 LAB — COMPREHENSIVE METABOLIC PANEL
ALT: 22 U/L (ref 0–44)
AST: 57 U/L — ABNORMAL HIGH (ref 15–41)
Albumin: 2.8 g/dL — ABNORMAL LOW (ref 3.5–5.0)
Alkaline Phosphatase: 39 U/L (ref 38–126)
Anion gap: 13 (ref 5–15)
BUN: 38 mg/dL — ABNORMAL HIGH (ref 8–23)
CO2: 23 mmol/L (ref 22–32)
Calcium: 8.9 mg/dL (ref 8.9–10.3)
Chloride: 97 mmol/L — ABNORMAL LOW (ref 98–111)
Creatinine, Ser: 1.81 mg/dL — ABNORMAL HIGH (ref 0.44–1.00)
GFR, Estimated: 30 mL/min — ABNORMAL LOW (ref >60)
Glucose, Bld: 88 mg/dL (ref 70–99)
Potassium: 4.6 mmol/L (ref 3.5–5.1)
Sodium: 133 mmol/L — ABNORMAL LOW (ref 135–145)
Total Bilirubin: 1.6 mg/dL — ABNORMAL HIGH (ref 0.3–1.2)
Total Protein: 9.1 g/dL — ABNORMAL HIGH (ref 6.5–8.1)

## 2021-03-03 LAB — CBC WITH DIFFERENTIAL/PLATELET
Abs Immature Granulocytes: 0.08 10*3/uL — ABNORMAL HIGH (ref 0.00–0.07)
Basophils Absolute: 0 10*3/uL (ref 0.0–0.1)
Basophils Relative: 0 %
Eosinophils Absolute: 0 10*3/uL (ref 0.0–0.5)
Eosinophils Relative: 0 %
HCT: 40.4 % (ref 36.0–46.0)
Hemoglobin: 12.4 g/dL (ref 12.0–15.0)
Immature Granulocytes: 1 %
Lymphocytes Relative: 6 %
Lymphs Abs: 0.8 10*3/uL (ref 0.7–4.0)
MCH: 25.5 pg — ABNORMAL LOW (ref 26.0–34.0)
MCHC: 30.7 g/dL (ref 30.0–36.0)
MCV: 83 fL (ref 80.0–100.0)
Monocytes Absolute: 0.4 10*3/uL (ref 0.1–1.0)
Monocytes Relative: 2 %
Neutro Abs: 13.3 10*3/uL — ABNORMAL HIGH (ref 1.7–7.7)
Neutrophils Relative %: 91 %
Platelets: 165 10*3/uL (ref 150–400)
RBC: 4.87 MIL/uL (ref 3.87–5.11)
RDW: 16.3 % — ABNORMAL HIGH (ref 11.5–15.5)
WBC: 14.6 10*3/uL — ABNORMAL HIGH (ref 4.0–10.5)
nRBC: 0 % (ref 0.0–0.2)

## 2021-03-03 LAB — PREALBUMIN: Prealbumin: 7.5 mg/dL — ABNORMAL LOW (ref 18–38)

## 2021-03-03 LAB — C-REACTIVE PROTEIN: CRP: 14 mg/dL — ABNORMAL HIGH (ref <1.0)

## 2021-03-03 LAB — RESP PANEL BY RT-PCR (FLU A&B, COVID) ARPGX2
Influenza A by PCR: NEGATIVE
Influenza B by PCR: NEGATIVE
SARS Coronavirus 2 by RT PCR: NEGATIVE

## 2021-03-03 LAB — GLUCOSE, CAPILLARY
Glucose-Capillary: 107 mg/dL — ABNORMAL HIGH (ref 70–99)
Glucose-Capillary: 99 mg/dL (ref 70–99)

## 2021-03-03 LAB — HEMOGLOBIN A1C
Hgb A1c MFr Bld: 5.3 % (ref 4.8–5.6)
Mean Plasma Glucose: 105.41 mg/dL

## 2021-03-03 LAB — CK: Total CK: 334 U/L — ABNORMAL HIGH (ref 38–234)

## 2021-03-03 LAB — SEDIMENTATION RATE: Sed Rate: 71 mm/hr — ABNORMAL HIGH (ref 0–22)

## 2021-03-03 MED ORDER — ONDANSETRON HCL 4 MG/2ML IJ SOLN: 4.0000 mg | Freq: Four times a day (QID) | INTRAMUSCULAR | Status: DC | PRN

## 2021-03-03 MED ORDER — THIAMINE HCL 100 MG PO TABS
100.0000 mg | ORAL_TABLET | Freq: Every day | ORAL | Status: DC
  Administered 2021-03-03 – 2021-03-16 (×14): 100 mg via ORAL
  Filled 2021-03-03 (×15): qty 1

## 2021-03-03 MED ORDER — ACETAMINOPHEN 325 MG PO TABS
650.0000 mg | ORAL_TABLET | Freq: Four times a day (QID) | ORAL | Status: DC | PRN
  Administered 2021-03-03 – 2021-03-11 (×12): 650 mg via ORAL
  Filled 2021-03-03 (×12): qty 2

## 2021-03-03 MED ORDER — SODIUM CHLORIDE 0.9 % IV SOLN
2.0000 g | Freq: Once | INTRAVENOUS | Status: AC
  Administered 2021-03-03: 2 g via INTRAVENOUS
  Filled 2021-03-03: qty 20

## 2021-03-03 MED ORDER — VANCOMYCIN HCL IN DEXTROSE 1-5 GM/200ML-% IV SOLN
1000.0000 mg | Freq: Once | INTRAVENOUS | Status: AC
  Administered 2021-03-03: 1000 mg via INTRAVENOUS
  Filled 2021-03-03: qty 200

## 2021-03-03 MED ORDER — ENOXAPARIN SODIUM 40 MG/0.4ML IJ SOSY
40.0000 mg | PREFILLED_SYRINGE | INTRAMUSCULAR | Status: DC
  Administered 2021-03-03 – 2021-03-14 (×12): 40 mg via SUBCUTANEOUS
  Filled 2021-03-03 (×13): qty 0.4

## 2021-03-03 MED ORDER — CEFTRIAXONE SODIUM 1 G IJ SOLR
1.0000 g | INTRAMUSCULAR | Status: DC
Start: 2021-03-03 — End: 2021-03-03

## 2021-03-03 MED ORDER — SODIUM CHLORIDE 0.9 % IV SOLN
2.0000 g | INTRAVENOUS | Status: AC
  Administered 2021-03-04 – 2021-03-09 (×6): 2 g via INTRAVENOUS
  Filled 2021-03-03 (×6): qty 20

## 2021-03-03 MED ORDER — ONDANSETRON HCL 4 MG PO TABS: 4.0000 mg | ORAL_TABLET | Freq: Four times a day (QID) | ORAL | Status: DC | PRN

## 2021-03-03 MED ORDER — VANCOMYCIN HCL 1250 MG/250ML IV SOLN: 1250.0000 mg | INTRAVENOUS | Status: DC

## 2021-03-03 MED ORDER — ASPIRIN EC 81 MG PO TBEC
81.0000 mg | DELAYED_RELEASE_TABLET | Freq: Every day | ORAL | Status: DC
  Administered 2021-03-03 – 2021-03-16 (×14): 81 mg via ORAL
  Filled 2021-03-03 (×15): qty 1

## 2021-03-03 MED ORDER — LACTATED RINGERS IV SOLN: INTRAVENOUS | Status: DC

## 2021-03-03 MED ORDER — ACETAMINOPHEN 650 MG RE SUPP: 650.0000 mg | Freq: Four times a day (QID) | RECTAL | Status: DC | PRN

## 2021-03-03 MED ORDER — METRONIDAZOLE 500 MG/100ML IV SOLN
500.0000 mg | Freq: Two times a day (BID) | INTRAVENOUS | Status: DC
  Administered 2021-03-03 – 2021-03-04 (×3): 500 mg via INTRAVENOUS
  Filled 2021-03-03 (×3): qty 100

## 2021-03-03 MED ORDER — HYDRALAZINE HCL 25 MG PO TABS: 25.0000 mg | ORAL_TABLET | Freq: Four times a day (QID) | ORAL | Status: DC | PRN

## 2021-03-03 MED ORDER — LACTATED RINGERS IV SOLN: INTRAVENOUS | Status: AC

## 2021-03-03 MED ORDER — METOPROLOL SUCCINATE ER 50 MG PO TB24
50.0000 mg | ORAL_TABLET | Freq: Every day | ORAL | Status: DC
  Administered 2021-03-03 – 2021-03-07 (×5): 50 mg via ORAL
  Filled 2021-03-03 (×6): qty 1

## 2021-03-03 MED ORDER — SODIUM CHLORIDE 0.9 % IV BOLUS
1000.0000 mL | Freq: Once | INTRAVENOUS | Status: AC
  Administered 2021-03-03: 1000 mL via INTRAVENOUS

## 2021-03-03 MED ORDER — LEVOTHYROXINE SODIUM 25 MCG PO TABS
25.0000 ug | ORAL_TABLET | Freq: Every day | ORAL | Status: DC
  Administered 2021-03-03 – 2021-03-16 (×14): 25 ug via ORAL
  Filled 2021-03-03 (×14): qty 1

## 2021-03-03 NOTE — ED Notes (Signed)
Patient transported to CT 

## 2021-03-03 NOTE — Progress Notes (Signed)
Inpatient Diabetes Program Recommendations  AACE/ADA: New Consensus Statement on Inpatient Glycemic Control (2015)  Target Ranges:  Prepandial:   less than 140 mg/dL      Peak postprandial:   less than 180 mg/dL (1-2 hours)      Critically ill patients:  140 - 180 mg/dL   No results found for: GLUCAP, HGBA1C  Review of Glycemic Control  Diabetes history: DM2 Outpatient Diabetes medications: Farxiga 10 mg QD Current orders for Inpatient glycemic control: None at present  HgbA1C has been ordered and is pending  Inpatient Diabetes Program Recommendations:    Add Novolog 0-6 units TID with meals.  Continue to follow glucose trends.  Thank you. Lorenda Peck, RD, LDN, CDE Inpatient Diabetes Coordinator [phone]

## 2021-03-03 NOTE — ED Triage Notes (Signed)
Pt reports falling yesterday morning and was on her floor from 0100-0400, Pt's daughter states that she has not been eating or taking her medication as prescribed. Pt has been having a hard time taking care of herself she lives alone.

## 2021-03-03 NOTE — Progress Notes (Signed)
Pharmacy Antibiotic Note  Michaela Baker is a 70 y.o. female admitted on 03/03/2021 with cellulitis.  Pharmacy has been consulted for vancomycin dosing.  Plan: Vancomycin 1gm IV x 1 then 1250mg  IV q48h (AUC 518.7, Scr 1.81) Follow renal function and clinical course     Temp (24hrs), Avg:98.8 F (37.1 C), Min:98.8 F (37.1 C), Max:98.8 F (37.1 C)  Recent Labs  Lab 03/03/21 0253  WBC 14.6*    CrCl cannot be calculated (Patient's most recent lab result is older than the maximum 21 days allowed.).    No Known Allergies  Antimicrobials this admission: 10/5 vanc >> 10/5 CTX x 1  Dose adjustments this admission:   Microbiology results:   Thank you for allowing pharmacy to be a part of this patient's care.  Dolly Rias RPh 03/03/2021, 3:32 AM

## 2021-03-03 NOTE — Progress Notes (Signed)
RLE venous duplex has been completed.  Preliminary results given to [place], [place].  Results can be found under chart review under CV PROC. 03/03/2021 10:27 AM Tay Whitwell RVT, RDMS

## 2021-03-03 NOTE — ED Notes (Signed)
Patient temperature reading at 101.9, this writer informed Teacher, early years/pre of elevated temp.

## 2021-03-03 NOTE — ED Notes (Signed)
Patient returned from CT

## 2021-03-03 NOTE — Progress Notes (Signed)
ABI exam has been completed.  Results can be found under chart review under CV PROC. 03/03/2021 12:53 PM Jayden Kratochvil RVT, RDMS

## 2021-03-03 NOTE — H&P (Signed)
History and Physical    Michaela Baker ETK:[id number] DOB: Nov 27, 1950 DOA: 03/03/2021  PCP: Diamantina Providence, FNP   Patient coming from: Home.  I have personally briefly reviewed patient's old medical records in [facility]  Chief Complaint: Fall.  HPI: Michaela Baker is a 69 y.o. female with medical history significant of unspecified CHF, hypertension, class II obesity, history of PVCs, type II DM, hypothyroidism, OSA who is coming to the emergency department due to having a fall at home and staying on the floor for about 3 hours unable to stand.  She stated that she has felt weak and unwell for the last few days.  Not sure if she has had a fever or any other symptoms.  Her granddaughter stated that her right foot wound has been there for over a year and she has been having wound care with a provider on a regular basis but did not know further details.  The patient is able to answer simple questions, deny headache, back, chest or abdominal pain at this time.  She stated that her foot discomfort is currently under control.  ED Course: Initial vital signs were temperature 98.8 F, pulse 102, respiration 18, BP 112/59 mmHg O2 sat 94% on room air.  Patient received 1000 mL of NS bolus, 2 g of ceftriaxone and started on vancomycin per pharmacy.  Lab work: Her CBC showed a white count of 14.6 with 91% neutrophils, 6% lymphocytes and 2% monocytes.  Sodium 133, chloride 97 mmol/L.  The rest of the electrolytes were normal.  BUN was 38, creatinine 1.81 and total bilirubin 1.6 mg/dL.  Total protein was 9.1 and albumin 2.8 g/dL.  AST was 57 units/L.  Total CK was 334 units/L.  Imaging: 2 view chest radiograph was any abnormalities.  Right hip x-ray with no acute fracture or dislocation.  Right foot x-ray did not show any fracture or dislocation of the right foot.  There was a calcified peripheral vascular disease.  CT head without contrast showed mild posterior convexity of scalp with soft tissue injury  without underlying skull fracture.  Normal brain appearance for age.  Review of Systems: As per HPI otherwise all other systems reviewed and are negative.  Past Medical History:  Diagnosis Date   CHF (congestive heart failure) (HCC)    Hypertension    Obesity (BMI 30-39.9)    Premature ventricular contractions    Past Surgical History:  Procedure Laterality Date   ABDOMINAL HYSTERECTOMY     CHOLECYSTECTOMY     TONSILLECTOMY     TUBAL LIGATION     Social History  reports that she has never smoked. She has never used smokeless tobacco. She reports that she does not drink alcohol and does not use drugs.  No Known Allergies  Family History  Problem Relation Age of Onset   Diabetes Mother    Heart failure Mother    Kidney failure Mother    Kidney failure Sister    Diabetes Sister    Heart attack Sister    Breast cancer Neg Hx    Prior to Admission medications   Medication Sig Start Date End Date Taking? Authorizing Provider  amLODipine (NORVASC) 10 MG tablet Take 10 mg by mouth daily. 02/04/20  Yes [provider]  aspirin EC 81 MG EC tablet Take 1 tablet (81 mg total) by mouth daily. 07/26/13  Yes Leatha Gilding, MD  dapagliflozin propanediol (FARXIGA) 10 MG TABS tablet Take 10 mg by mouth daily.  Yes [provider]  furosemide (LASIX) 40 MG tablet Take 40 mg by mouth daily.   Yes [provider]  hydrALAZINE (APRESOLINE) 50 MG tablet Take 50 mg by mouth daily.   Yes [provider]  levothyroxine (SYNTHROID, LEVOTHROID) 25 MCG tablet Take 25 mcg by mouth daily. 02/05/15  Yes [provider]  lisinopril (PRINIVIL,ZESTRIL) 20 MG tablet Take 20 mg by mouth daily.  10/29/16  Yes [provider]  metoprolol succinate (TOPROL-XL) 50 MG 24 hr tablet Take 50 mg by mouth daily. Take with or immediately following a meal.   Yes [provider]  Multiple Vitamins-Minerals (MULTIVITAMIN WITH MINERALS) tablet Take 1 tablet by  mouth daily.   Yes [provider]  naproxen sodium (ALEVE) 220 MG tablet Take 440 mg by mouth daily as needed (pain).   Yes [provider]  potassium chloride (K-DUR,KLOR-CON) 10 MEQ tablet Take 20 mEq by mouth daily. 10/29/16  Yes [provider]  thiamine 100 MG tablet Take 100 mg by mouth daily.   Yes [provider]  Omega-3 Fatty Acids (FISH OIL) 1000 MG CAPS Take 1,000 mg by mouth daily.    [provider]   Physical Exam: Vitals:   03/03/21 0445 03/03/21 0545 03/03/21 0600 03/03/21 0700  BP: (!) 126/59 (!) 127/56 (!) 134/57 (!) 128/56  Pulse: 96 92 89 91  Resp: (!) 34 (!) 23 (!) 33 (!) 27  Temp:      TempSrc:      SpO2: 99% 100% 99% 96%   Constitutional: Looks chronically ill.  In NAD. Eyes: PERRL, lids and conjunctivae normal ENMT: Mucous membranes are dry.  Posterior pharynx clear of any exudate or lesions. Neck: normal, supple, no masses, no thyromegaly Respiratory: clear to auscultation bilaterally, no wheezing, no crackles. Normal respiratory effort. No accessory muscle use.  Cardiovascular: Regular rate with occasional extrasystole no murmurs / rubs / gallops. No extremity edema. 2+ pedal pulses. No carotid bruits.  Abdomen: Obese, no distention.  Bowel sounds positive.  Soft, no tenderness, no masses palpated. No hepatosplenomegaly.  Musculoskeletal: Moderate to severe generalized weakness.  No clubbing / cyanosis.  Good ROM, no contractures. Normal muscle tone.  Skin: Positive edema, erythema, calor and TTP left foot and ankle.  Please see picture below. Neurologic: CN 2-12 grossly intact.  Decreased sensation distally on extremities, DTR normal. Strength 5/5 in all 4.  Psychiatric: Alert and oriented x 2, disoriented to time, day and partially oriented to situation.  Normal mood.       Labs on Admission: I have personally reviewed following labs and imaging studies  CBC: Recent Labs  Lab 03/03/21 0253  WBC 14.6*   NEUTROABS 13.3*  HGB 12.4  HCT 40.4  MCV 83.0  PLT 828    Basic Metabolic Panel: Recent Labs  Lab 03/03/21 0253  NA 133*  K 4.6  CL 97*  CO2 23  GLUCOSE 88  BUN 38*  CREATININE 1.81*  CALCIUM 8.9    GFR: CrCl cannot be calculated (Unknown ideal weight.).  Liver Function Tests: Recent Labs  Lab 03/03/21 0253  AST 57*  ALT 22  ALKPHOS 39  BILITOT 1.6*  PROT 9.1*  ALBUMIN 2.8*   Radiological Exams on Admission: DG Chest 2 View  Result Date: 03/03/2021 CLINICAL DATA:  70 year old female status post fall, found down. Pain. EXAM: CHEST - 2 VIEW COMPARISON:  Chest radiographs 03/28/2017 and earlier. FINDINGS: Semi upright AP and lateral views of the chest. Mildly rotated to  the right. Lung volumes and mediastinal contours are stable. Calcified aortic atherosclerosis. Visualized tracheal air column is within normal limits. Chronic increased pulmonary interstitial markings. No pneumothorax, pulmonary edema, pleural effusion or confluent pulmonary opacity. No acute osseous abnormality identified. Negative visible bowel gas pattern. IMPRESSION: No acute cardiopulmonary abnormality or acute traumatic injury identified. Electronically Signed   By: Genevie Ann M.D.   On: 03/03/2021 05:03   DG Tibia/Fibula Right  Result Date: 03/03/2021 CLINICAL DATA:  70 year old female status post fall, found down. Pain. EXAM: RIGHT TIBIA AND FIBULA - 2 VIEW COMPARISON:  Skeletal survey 08/29/2008. FINDINGS: Bone mineralization is within normal limits for age. Alignment appears preserved at the right knee and ankle. Tricompartmental degenerative changes partially visible at the knee. Right tibia and fibula appear intact. Generalized soft tissue swelling and stranding. Small calcified phleboliths as well as calcified peripheral vascular disease scattered in the soft tissues. No acute osseous abnormality identified. IMPRESSION: 1. No acute fracture or dislocation identified about the right tib-fib. 2.  Chronic right knee degeneration. Calcified peripheral vascular disease. Electronically Signed   By: Genevie Ann M.D.   On: 03/03/2021 05:00   CT HEAD WO CONTRAST (5MM)  Result Date: 03/03/2021 CLINICAL DATA:  70 year old female status post fall, found down. Pain. EXAM: CT HEAD WITHOUT CONTRAST TECHNIQUE: Contiguous axial images were obtained from the base of the skull through the vertex without intravenous contrast. COMPARISON:  None. FINDINGS: Brain: Small midline lipomas of the falx, normal variant. Cerebral volume is within normal limits for age. No midline shift, ventriculomegaly, mass effect, evidence of mass lesion, intracranial hemorrhage or evidence of cortically based acute infarction. Gray-white matter differentiation is within normal limits for age. No cortical encephalomalacia identified. Vascular: Calcified atherosclerosis at the skull base. No suspicious intracranial vascular hyperdensity. Skull: Negative.  No fracture identified. Sinuses/Orbits: Mild paranasal sinus mucosal thickening. Tympanic cavities and mastoids are well aerated. Other: Postoperative changes to the left globe and mildly Disconjugate gaze. Mild right posterior convexity scalp hematoma or contusion on series 2, image 21. Underlying calvarium intact. No other discrete orbit or scalp soft tissue injury identified. IMPRESSION: 1. Mild right posterior convexity scalp soft tissue injury without underlying skull fracture. 2. Normal for age non contrast CT appearance of the brain. Electronically Signed   By: Genevie Ann M.D.   On: 03/03/2021 04:58   DG Foot 2 Views Right  Result Date: 03/03/2021 CLINICAL DATA:  70 year old female status post fall, found down. Pain. EXAM: RIGHT FOOT - 2 VIEW COMPARISON:  Right foot series 10/30/2012. FINDINGS: Bone mineralization is within normal limits. Progressed degenerative spurring at the calcaneus and along the dorsal tarsal bones since 2014. But joint spaces and alignment are preserved. No fracture  or dislocation identified. No acute osseous abnormality identified. Calcified peripheral vascular disease in the foot and ankle. No discrete soft tissue injury. IMPRESSION: 1. No acute fracture or dislocation identified about the right foot. 2. Calcified peripheral vascular disease. Electronically Signed   By: Genevie Ann M.D.   On: 03/03/2021 05:02   DG Hip Unilat W or Wo Pelvis 2-3 Views Right  Result Date: 03/03/2021 CLINICAL DATA:  70 year old female status post fall, found down. Pain. EXAM: DG HIP (WITH OR WITHOUT PELVIS) 2-3V RIGHT COMPARISON:  Bone survey 08/29/2008. FINDINGS: Pelvis and AP view of the hip are oblique to the right. Femoral heads remain normally located. Background bone mineralization appears stable since 2010 and within normal limits. No pelvis fracture identified. SI joints appear symmetric. Grossly intact proximal  left femur. Proximal right femur appears intact. Partially visible chronic lumbar disc and endplate degeneration. IMPRESSION: No acute fracture or dislocation identified about the right hip or pelvis. Electronically Signed   By: Genevie Ann M.D.   On: 03/03/2021 04:55    02/21/2020.  POV echocardiogram Echocardiogram 02/21/2020:  Left ventricle cavity is normal in size and wall thickness. Normal global  wall motion. Normal LV systolic function with EF 62%. Normal diastolic  filling pattern.  Right atrial cavity is moderately dilated.  Moderate tricuspid regurgitation. Estimated pulmonary artery systolic  pressure 38 mmHg.   EKG: Independently reviewed.  Vent. rate 96 BPM PR interval 173 ms QRS duration 87 ms QT/QTcB 334/422 ms P-R-T axes 58 49 32 Sinus tachycardia Multiple ventricular premature complexes Consider left atrial enlargement Posterior infarct, old  Assessment/Plan Principal Problem:   AKI (acute kidney injury) ([place]) Observation/telemetry. Continue time-limited IV fluids. Monitor intake and output. Hold furosemide and lisinopril. Avoid  hypotension. Follow-up GFR and electrolytes.  Active Problems:   Hyponatremia Secondary to furosemide use. Has not had recent good oral intake. Hold furosemide. Follow-up sodium level.    Cellulitis of right lower extremity Continue vancomycin per pharmacy. Continue ceftriaxone 2 g every 24 hours. Add metronidazole 500 mg IVPB every 12 hours. Check CRP, ESR, Hgb A1c and prealbumin. Check lower extremities ABI with/without TBI. Risk for amputation discussed briefly with the family.    Abnormal LFTs Follow-up hepatic function after hydration.    HTN (hypertension) Holding diuretic and ACE inhibitor. Continue metoprolol 75 mg p.o. daily. Use hydralazine as needed every 6 hours.    OSA (obstructive sleep apnea) Currently not using CPAP.    Hypothyroidism Continue levothyroxine 25 mcg p.o. daily.    Class II obesity Continue lifestyle modifications. Follow-up with PCP.   DVT prophylaxis: Lovenox SQ. Code Status:   Full code. Family Communication:  Discussed at length with her daughter over the phone and granddaughter in person. Disposition Plan:   Patient is from:  Home.  Anticipated DC to:  Home.  Anticipated DC date:  03/04/2021 or 03/05/2021.  Anticipated DC barriers: Clinical status.  Consults called:  TOC team. Admission status:  Observation/telemetry.   Severity of Illness: High severity after presenting with with mild rhabdomyolysis producing AKI after falling on the floor and being unable to stand for 3 hours before being assisted.  Reubin Milan MD [facility]  How to contact the [facility] Attending or Consulting provider [place] or covering provider during after hours [place], for this patient?   Check the care team in [facility] and look for a) attending/consulting [facility] provider listed and b) the [facility] team listed Log into [email or web address] and use 's universal password to access. If you do not have the password, please contact the hospital  operator. Locate the [facility] provider you are looking for under [facility] and page to a number that you can be directly reached. If you still have difficulty reaching the provider, please page the [facility] (Director on Call) for the Hospitalists listed on amion for assistance.  03/03/2021, 8:41 AM   This document was prepared using Dragon voice recognition software and may contain some unintended transcription errors.

## 2021-03-03 NOTE — ED Provider Notes (Signed)
[facility] Emergency Department Provider Note MRN:  [id number]  Arrival date & time: 03/03/21     Chief Complaint   Fall   History of Present Illness   Michaela Baker is a 70 y.o. year-old female with a history of hypertension, CHF presenting to the ED with chief complaint of fall.  Fall earlier this evening, on the ground unable to get up for over 3 hours.  Reportedly not caring for herself leg normal recently, not eating well, not taking her medicines.  Patient does not remember how she fell.  She does not think she passed out, she is unsure if she hit her head.  Denies headache or vision change, no neck pain, no chest pain or shortness of breath, no abdominal pain.  Endorsing right hip pain, also some new right leg redness, swelling, discomfort.  Pain constant, worse with motion or palpation.  Moderate in severity.  Review of Systems  A complete 10 system review of systems was obtained and all systems are negative except as noted in the HPI and PMH.   Patient's Health History    Past Medical History:  Diagnosis Date   CHF (congestive heart failure) (HCC)    Hypertension    Obesity (BMI 30-39.9)    Premature ventricular contractions     Past Surgical History:  Procedure Laterality Date   ABDOMINAL HYSTERECTOMY     CHOLECYSTECTOMY     TONSILLECTOMY     TUBAL LIGATION      Family History  Problem Relation Age of Onset   Diabetes Mother    Heart failure Mother    Kidney failure Mother    Kidney failure Sister    Diabetes Sister    Heart attack Sister    Breast cancer Neg Hx     Social History   Socioeconomic History   Marital status: Married    Spouse name: Not on file   Number of children: 2   Years of education: Not on file   Highest education level: Not on file  Occupational History   Not on file  Tobacco Use   Smoking status: Never   Smokeless tobacco: Never  Vaping Use   Vaping Use: Never used  Substance and Sexual Activity    Alcohol use: No   Drug use: No   Sexual activity: Never  Other Topics Concern   Not on file  Social History Narrative   Not on file   Social Determinants of Health   Financial Resource Strain: Not on file  Food Insecurity: Not on file  Transportation Needs: Not on file  Physical Activity: Not on file  Stress: Not on file  Social Connections: Not on file  Intimate Partner Violence: Not on file     Physical Exam   Vitals:   03/03/21 0315 03/03/21 0445  BP: (!) 127/59 (!) 126/59  Pulse: (!) 106 96  Resp: (!) 27 (!) 34  Temp:    SpO2: 94% 99%    CONSTITUTIONAL: Well-appearing, NAD NEURO:  Alert and oriented to name and place, moves all extremities EYES:  eyes equal and reactive ENT/NECK:  no LAD, no JVD CARDIO: Regular rate, well-perfused, normal S1 and S2 PULM:  CTAB no wheezing or rhonchi GI/GU:  normal bowel sounds, non-distended, non-tender MSK/SPINE:  No gross deformities, no edema SKIN: Diffuse erythema and edema to the right lower extremity, wound to the right heel PSYCH:  Appropriate speech and behavior  *Additional and/or pertinent findings included in MDM below  Diagnostic and Interventional Summary    EKG Interpretation  Date/Time:  Wednesday March 03 2021 04:20:19 EDT Ventricular Rate:  96 PR Interval:  173 QRS Duration: 87 QT Interval:  334 QTC Calculation: 422 R Axis:   49 Text Interpretation: Sinus tachycardia Multiple ventricular premature complexes Consider left atrial enlargement Posterior infarct, old Confirmed by Gerlene Fee [phone]) on 03/03/2021 5:05:43 AM       Labs Reviewed  CBC WITH DIFFERENTIAL/PLATELET - Abnormal; Notable for the following components:      Result Value   WBC 14.6 (*)    MCH 25.5 (*)    RDW 16.3 (*)    Neutro Abs 13.3 (*)    Abs Immature Granulocytes 0.08 (*)    All other components within normal limits  COMPREHENSIVE METABOLIC PANEL - Abnormal; Notable for the following components:   Sodium 133 (*)     Chloride 97 (*)    BUN 38 (*)    Creatinine, Ser 1.81 (*)    Total Protein 9.1 (*)    Albumin 2.8 (*)    AST 57 (*)    Total Bilirubin 1.6 (*)    GFR, Estimated 30 (*)    All other components within normal limits  CK - Abnormal; Notable for the following components:   Total CK 334 (*)    All other components within normal limits  URINALYSIS, ROUTINE W REFLEX MICROSCOPIC    CT HEAD WO CONTRAST (5MM)  Final Result    DG Hip Unilat W or Wo Pelvis 2-3 Views Right  Final Result    DG Tibia/Fibula Right  Final Result    DG Foot 2 Views Right  Final Result    DG Chest 2 View  Final Result    VAS [place] LOWER EXTREMITY VENOUS (DVT) (ONLY [facility] & [facility])    (Results Pending)    Medications  vancomycin (VANCOCIN) IVPB 1000 mg/200 mL premix (has no administration in time range)  vancomycin (VANCOREADY) IVPB 1250 mg/250 mL (has no administration in time range)  sodium chloride 0.9 % bolus 1,000 mL (1,000 mLs Intravenous New Bag/Given 03/03/21 0426)  cefTRIAXone (ROCEPHIN) 2 g in sodium chloride 0.9 % 100 mL IVPB (2 g Intravenous New Bag/Given 03/03/21 0426)     Procedures  /  Critical Care Procedures  ED Course and Medical Decision Making  I have reviewed the triage vital signs, the nursing notes, and pertinent available records from the EMR.  Listed above are laboratory and imaging tests that I personally ordered, reviewed, and interpreted and then considered in my medical decision making (see below for details).  Exam is concerning for a lower extremity cellulitis stemming from a wound on the right heel.  DVT also considered, would consider DVT ultrasound in the morning.  Given the exam and the worsening functional status, anticipating admission.  Awaiting labs, imaging.     Work-up reveals AKI, will admit to medicine for further care.  Barth Kirks. Sedonia Small, [place] [email or web address]  Final Clinical Impressions(s) / ED Diagnoses      ICD-10-CM   1. Cellulitis of right lower extremity  L03.115     2. Fall  W19.Merril Abbe DG Chest 2 View    DG Chest 2 View    3. AKI (acute kidney injury) ([place])  N17.9       ED Discharge Orders     None        Discharge Instructions Discussed with and Provided to Patient:   Discharge  Instructions   None       Maudie Flakes, MD 03/03/21 [phone]

## 2021-03-04 DIAGNOSIS — E861 Hypovolemia: Secondary | ICD-10-CM | POA: Diagnosis present

## 2021-03-04 DIAGNOSIS — G9341 Metabolic encephalopathy: Secondary | ICD-10-CM | POA: Diagnosis not present

## 2021-03-04 DIAGNOSIS — M6282 Rhabdomyolysis: Secondary | ICD-10-CM | POA: Diagnosis present

## 2021-03-04 DIAGNOSIS — A419 Sepsis, unspecified organism: Secondary | ICD-10-CM | POA: Diagnosis not present

## 2021-03-04 DIAGNOSIS — J189 Pneumonia, unspecified organism: Secondary | ICD-10-CM | POA: Diagnosis present

## 2021-03-04 DIAGNOSIS — I509 Heart failure, unspecified: Secondary | ICD-10-CM | POA: Diagnosis present

## 2021-03-04 DIAGNOSIS — J9601 Acute respiratory failure with hypoxia: Secondary | ICD-10-CM | POA: Diagnosis present

## 2021-03-04 DIAGNOSIS — Z79899 Other long term (current) drug therapy: Secondary | ICD-10-CM | POA: Diagnosis not present

## 2021-03-04 DIAGNOSIS — Z79890 Hormone replacement therapy (postmenopausal): Secondary | ICD-10-CM | POA: Diagnosis not present

## 2021-03-04 DIAGNOSIS — G4733 Obstructive sleep apnea (adult) (pediatric): Secondary | ICD-10-CM | POA: Diagnosis not present

## 2021-03-04 DIAGNOSIS — E871 Hypo-osmolality and hyponatremia: Secondary | ICD-10-CM

## 2021-03-04 DIAGNOSIS — I110 Hypertensive heart disease with heart failure: Secondary | ICD-10-CM | POA: Diagnosis present

## 2021-03-04 DIAGNOSIS — I10 Essential (primary) hypertension: Secondary | ICD-10-CM | POA: Diagnosis not present

## 2021-03-04 DIAGNOSIS — R5381 Other malaise: Secondary | ICD-10-CM | POA: Diagnosis not present

## 2021-03-04 DIAGNOSIS — N179 Acute kidney failure, unspecified: Secondary | ICD-10-CM | POA: Diagnosis not present

## 2021-03-04 DIAGNOSIS — E039 Hypothyroidism, unspecified: Secondary | ICD-10-CM | POA: Diagnosis not present

## 2021-03-04 DIAGNOSIS — I5020 Unspecified systolic (congestive) heart failure: Secondary | ICD-10-CM | POA: Diagnosis not present

## 2021-03-04 DIAGNOSIS — Z833 Family history of diabetes mellitus: Secondary | ICD-10-CM | POA: Diagnosis not present

## 2021-03-04 DIAGNOSIS — E1151 Type 2 diabetes mellitus with diabetic peripheral angiopathy without gangrene: Secondary | ICD-10-CM | POA: Diagnosis present

## 2021-03-04 DIAGNOSIS — E669 Obesity, unspecified: Secondary | ICD-10-CM | POA: Diagnosis present

## 2021-03-04 DIAGNOSIS — R0902 Hypoxemia: Secondary | ICD-10-CM | POA: Diagnosis not present

## 2021-03-04 DIAGNOSIS — E860 Dehydration: Secondary | ICD-10-CM | POA: Diagnosis present

## 2021-03-04 DIAGNOSIS — T501X5A Adverse effect of loop [high-ceiling] diuretics, initial encounter: Secondary | ICD-10-CM | POA: Diagnosis present

## 2021-03-04 DIAGNOSIS — Z6834 Body mass index (BMI) 34.0-34.9, adult: Secondary | ICD-10-CM | POA: Diagnosis not present

## 2021-03-04 DIAGNOSIS — Z841 Family history of disorders of kidney and ureter: Secondary | ICD-10-CM | POA: Diagnosis not present

## 2021-03-04 DIAGNOSIS — L03115 Cellulitis of right lower limb: Secondary | ICD-10-CM | POA: Diagnosis not present

## 2021-03-04 DIAGNOSIS — R7989 Other specified abnormal findings of blood chemistry: Secondary | ICD-10-CM | POA: Diagnosis not present

## 2021-03-04 DIAGNOSIS — L03116 Cellulitis of left lower limb: Secondary | ICD-10-CM | POA: Diagnosis present

## 2021-03-04 DIAGNOSIS — Z20822 Contact with and (suspected) exposure to covid-19: Secondary | ICD-10-CM | POA: Diagnosis present

## 2021-03-04 LAB — CBC
HCT: 34.6 % — ABNORMAL LOW (ref 36.0–46.0)
Hemoglobin: 10.7 g/dL — ABNORMAL LOW (ref 12.0–15.0)
MCH: 25.4 pg — ABNORMAL LOW (ref 26.0–34.0)
MCHC: 30.9 g/dL (ref 30.0–36.0)
MCV: 82 fL (ref 80.0–100.0)
Platelets: 159 10*3/uL (ref 150–400)
RBC: 4.22 MIL/uL (ref 3.87–5.11)
RDW: 16.1 % — ABNORMAL HIGH (ref 11.5–15.5)
WBC: 26.5 10*3/uL — ABNORMAL HIGH (ref 4.0–10.5)
nRBC: 0 % (ref 0.0–0.2)

## 2021-03-04 LAB — COMPREHENSIVE METABOLIC PANEL
ALT: 20 U/L (ref 0–44)
AST: 45 U/L — ABNORMAL HIGH (ref 15–41)
Albumin: 2.1 g/dL — ABNORMAL LOW (ref 3.5–5.0)
Alkaline Phosphatase: 38 U/L (ref 38–126)
Anion gap: 6 (ref 5–15)
BUN: 31 mg/dL — ABNORMAL HIGH (ref 8–23)
CO2: 25 mmol/L (ref 22–32)
Calcium: 8.3 mg/dL — ABNORMAL LOW (ref 8.9–10.3)
Chloride: 103 mmol/L (ref 98–111)
Creatinine, Ser: 1.3 mg/dL — ABNORMAL HIGH (ref 0.44–1.00)
GFR, Estimated: 44 mL/min — ABNORMAL LOW (ref >60)
Glucose, Bld: 83 mg/dL (ref 70–99)
Potassium: 3.9 mmol/L (ref 3.5–5.1)
Sodium: 134 mmol/L — ABNORMAL LOW (ref 135–145)
Total Bilirubin: 1 mg/dL (ref 0.3–1.2)
Total Protein: 7.4 g/dL (ref 6.5–8.1)

## 2021-03-04 LAB — GLUCOSE, CAPILLARY
Glucose-Capillary: 74 mg/dL (ref 70–99)
Glucose-Capillary: 98 mg/dL (ref 70–99)
Glucose-Capillary: 95 mg/dL (ref 70–99)
Glucose-Capillary: 149 mg/dL — ABNORMAL HIGH (ref 70–99)
Glucose-Capillary: 132 mg/dL — ABNORMAL HIGH (ref 70–99)

## 2021-03-04 LAB — CK: Total CK: 332 U/L — ABNORMAL HIGH (ref 38–234)

## 2021-03-04 LAB — PROTIME-INR
INR: 1.5 — ABNORMAL HIGH (ref 0.8–1.2)
Prothrombin Time: 17.9 seconds — ABNORMAL HIGH (ref 11.4–15.2)

## 2021-03-04 MED ORDER — VANCOMYCIN HCL IN DEXTROSE 1-5 GM/200ML-% IV SOLN
1000.0000 mg | INTRAVENOUS | Status: DC
  Administered 2021-03-04 – 2021-03-08 (×5): 1000 mg via INTRAVENOUS
  Filled 2021-03-04 (×5): qty 200

## 2021-03-04 MED ORDER — SODIUM CHLORIDE 0.9 % IV SOLN: INTRAVENOUS | Status: DC

## 2021-03-04 NOTE — Progress Notes (Signed)
Pharmacy Antibiotic Note  Michaela Baker is a 70 y.o. female admitted on 03/03/2021 with cellulitis.  Pharmacy has been consulted for vancomycin dosing.  Vancomycin had been initially stopped after one time ED dose on 10/5 but is being resumed again   Plan: Vancomycin 1gm IV x 1 given on 10/5 at 0510. Resume vancomycin at 1000 mg IV q24h  t  (AUC 500.7, Scr 1.30) Follow renal function and clinical course  Height: 5\' 5"  (165.1 cm) Weight: 81.5 kg (179 lb 10.8 oz) IBW/kg (Calculated) : 57  Temp (24hrs), Avg:99.8 F (37.7 C), Min:98.4 F (36.9 C), Max:102 F (38.9 C)  Recent Labs  Lab 03/03/21 0253 03/04/21 0521  WBC 14.6* 26.5*  CREATININE 1.81* 1.30*     Estimated Creatinine Clearance: 41.9 mL/min (A) (by C-G formula based on SCr of 1.3 mg/dL (H)).    No Known Allergies  Antimicrobials this admission:  10/5 vanc x 1 dose , resume 10/6 >>  10/5 CTX >>10/11 10/5 Flagyl >> 10/6   Dose adjustments this admission:   Microbiology results:      Royetta Asal, PharmD, BCPS 03/04/2021 4:23 PM

## 2021-03-04 NOTE — Progress Notes (Signed)
   03/04/21 1302  Assess: MEWS Score  Temp (!) 102 F (38.9 C)  BP (!) 102/41  Pulse Rate 83  Resp (!) 27  Level of Consciousness Alert  SpO2 93 %  O2 Device Nasal Cannula  O2 Flow Rate (L/min) 2 L/min  Assess: MEWS Score  MEWS Temp 2  MEWS Systolic 0  MEWS Pulse 0  MEWS RR 2  MEWS LOC 0  MEWS Score 4  MEWS Score Color Red  Assess: if the MEWS score is Yellow or Red  Were vital signs taken at a resting state? Yes  Focused Assessment No change from prior assessment  Does the patient meet 2 or more of the SIRS criteria? Yes  Does the patient have a confirmed or suspected source of infection? Yes  Provider and Rapid Response Notified? Yes  MEWS guidelines implemented *See Row Information* Yes  Treat  MEWS Interventions Escalated (See documentation below)  Pain Scale 0-10  Pain Score 0  Take Vital Signs  Increase Vital Sign Frequency  Red: Q 1hr X 4 then Q 4hr X 4, if remains red, continue Q 4hrs  Escalate  MEWS: Escalate Red: discuss with charge nurse/RN and provider, consider discussing with RRT  Notify: Charge Nurse/RN  Name of Charge Nurse/RN Notified Wayna Chalet, RN  Date Charge Nurse/RN Notified 03/04/21  Time Charge Nurse/RN Notified 1304  Notify: Provider  Provider Name/Title Arrien, MD  Date Provider Notified 03/04/21  Time Provider Notified 1314  Notification Type Call  Notification Reason Other (Comment) (Rew MEWS)  Provider response En route  Date of Provider Response 03/04/21  Time of Provider Response 1328  Notify: Rapid Response  Name of Rapid Response RN Notified Dawn, RN  Date Rapid Response Notified 03/04/21  Time Rapid Response Notified 1310  Document  Patient Outcome Stabilized after interventions  Progress note created (see row info) Yes  Assess: SIRS CRITERIA  SIRS Temperature  1  SIRS Pulse 0  SIRS Respirations  1  SIRS WBC 1  SIRS Score Sum  3  Patient in red MEWS. Agricultural consultant notified. RR (Dawn,RN) notified. MD notified via call. Red MEW  protocol followed for this patient. Patient daughter and son at bedside at this time of event.

## 2021-03-04 NOTE — Progress Notes (Signed)
Inpatient Diabetes Program Recommendations  AACE/ADA: New Consensus Statement on Inpatient Glycemic Control (2015)  Target Ranges:  Prepandial:   less than 140 mg/dL      Peak postprandial:   less than 180 mg/dL (1-2 hours)      Critically ill patients:  140 - 180 mg/dL   Lab Results  Component Value Date   GLUCAP 74 03/04/2021   HGBA1C 5.3 03/03/2021    Review of Glycemic Control Results for Michaela Baker, Michaela Baker (MRN [id number]) as of 03/04/2021 08:35  Ref. Range 03/03/2021 17:00 03/03/2021 20:19 03/04/2021 08:00  Glucose-Capillary Latest Ref Range: 70 - 99 mg/dL 107 (H) 99 74   Diabetes history: DM2 Outpatient Diabetes medications: Farxiga 10 mg QD Current orders for Inpatient glycemic control: None at present  HgbA1C 5.3%  Continue to follow glucose trends if outside of normal limits. Sign off Diabetes Coordinator referral.  Thanks,   Tama Headings RN, MSN, BC-ADM Inpatient Diabetes Coordinator Team Pager [phone] (8a-5p)

## 2021-03-04 NOTE — Progress Notes (Signed)
PROGRESS NOTE    Michaela Baker  AJG:[id number] DOB: July 12, 1950 DOA: 03/03/2021 PCP: [name], FNP    Brief Narrative:  70 year old female admitted for aki / foot wound / septic  Michaela Baker is a very pleasant 70 year old female who was admitted for AKI following a fall from bed at home, who also has a chronic right foot wound. HX of htn, obesity, DM, hypothyroidism, CHF. She reports she fell at home, from the bed, unknown how she fell, was unable to lift herself up, and was stuck on the floor for several hours. She was transported to the er, found to have an AKI, hyponatremia, and a chronic right foot wound.    Per Daughter, she has not been to her foot wound care in months, [place], DPM , [address], [phone].  Currently febrile, 102.8, 84 hr with pvc's. 32 respiration unlabored,  Head ct Normal for age non contrast CT appearance of the brain Cxr: No acute cardiopulmonary abnormality or acute traumatic injury identified. xray of lower extremities and pelvis was without fracture    10/6 na 134, k 3.9, cl 103, co2 25, glucose 83,  crt 1.3, bun 31, albunin 2.1, gfr 44  10/6 cbc   wbc 26.5  hgb 10.7  plt 159  LE [place] Summary:  RIGHT:  - There is no evidence of deep vein thrombosis in the lower extremity.  - There is no evidence of superficial venous thrombosis.     - No cystic structure found in the popliteal fossa.  - Ultrasound characteristics of enlarged lymph nodes are noted in the  groin.     LEFT:  - No evidence of common femoral vein obstruction.  - Ultrasound characteristics of enlarged lymph nodes noted in the groin.      Assessment & Plan:   Principal Problem:   AKI (acute kidney injury) ([place]) Active Problems:   HTN (hypertension)   OSA (obstructive sleep apnea)   Hypothyroidism   Class II obesity   Hyponatremia   Abnormal LFTs   Cellulitis of right lower extremity   Sepsis ([place])   AKI: Initial creatinine 1.81 10/5 vs normal 0.9,    10/6 1.3  Plan: Monitor I/O Hold home lisinopril Hold home lasix Hold home farxiga Monitor electrolytes Avoid nephrotoxic medications   Sepsis: Wbc 24k, temp 102.8, resp 32, with foot wound  Plan: Rapid response activated No pressors needed at this time Broad spectrum antibiotics already given  Cellulitis right lower extremity  Chronic foot wound, rigght heel  LE [place] no DVT  Already receiving antibiotics  Consult wound care   HTN: Holding home antihypertensives due to AKI  OSA: Does not wear cpap at home, remote diagnosis  Obesity: Outpatient lifestyle changes  Hypothyroidism: Continue daily levothyroxine  Hyponatremia: Hypovolemic hyponatremia secondary to diuretic use Holding home diuretics 10/5 na 133   10/5 134   continue to monitor       Patient continue to be at high risk for septic shock, AKI,   Status is: Inpatient  Remains inpatient appropriate because:Ongoing diagnostic testing needed not appropriate for outpatient work up, IV treatments appropriate due to intensity of illness or inability to take PO, and Inpatient level of care appropriate due to severity of illness  Dispo: The patient is from: Home              Anticipated d/c is to: Home  Patient currently is not medically stable to d/c.   Difficult to place patient No       DVT prophylaxis: lovenox Code Status:   full Family Communication: family in room    Nutrition Status:     Heart healthy / carb modified       Skin Documentation:  Wound, right heel   Procedures:  Ct head Cxr  xray foot right  Xray lower ext right Xray pelvis  Antimicrobials:  Ceftriaxone 2g q24hr 10/5 - current Metronidazole 500mg  q12hr 10/5 - current Vancomycin 1250mg  10/5 - 10/5   Subjective: She reports chills, is otherwise without complaint  Objective: Vitals:   03/03/21 2017 03/03/21 2320 03/04/21 0312 03/04/21 0922  BP: (!) 117/49 (!) 118/50 (!) 126/53 (!) 135/45   Pulse: 72 72 79 84  Resp: 20 20 20    Temp: 98.4 F (36.9 C) 98.7 F (37.1 C) 99.2 F (37.3 C) (!) 100.9 F (38.3 C)  TempSrc: Axillary Axillary Axillary Oral  SpO2: 95% 96% 95% (!) 89%  Weight:      Height:        Intake/Output Summary (Last 24 hours) at 03/04/2021 1219 Last data filed at 03/04/2021 0900 Gross per 24 hour  Intake 1550.31 ml  Output 1000 ml  Net 550.31 ml   Filed Weights   03/03/21 1100 03/03/21 1726  Weight: 94.3 kg 81.5 kg    Examination:   General: Not in pain or dyspnea  Neurology: Awake and alert, non focal   E ENT: no pallor, no icterus, oral mucosa moist  Cardiovascular: No JVD. S1-S2 present, rhythmic, no gallops, rubs, or murmurs. No left lower extremity edema  Pulmonary: vesicular breath sounds bilaterally, adequate air movement, no wheezing, rhonchi or rales.  Gastrointestinal. Abdomen flat, no organomegaly, non tender, no rebound or guarding  Skin. Cellulitis of RLE, foot wound right heel  Musculoskeletal: no joint deformities     Data Reviewed: I have personally reviewed following labs and imaging studies  CBC: Recent Labs  Lab 03/03/21 0253 03/04/21 0521  WBC 14.6* 26.5*  NEUTROABS 13.3*  --   HGB 12.4 10.7*  HCT 40.4 34.6*  MCV 83.0 82.0  PLT 165 147   Basic Metabolic Panel: Recent Labs  Lab 03/03/21 0253 03/04/21 0521  NA 133* 134*  K 4.6 3.9  CL 97* 103  CO2 23 25  GLUCOSE 88 83  BUN 38* 31*  CREATININE 1.81* 1.30*  CALCIUM 8.9 8.3*   GFR: Estimated Creatinine Clearance: 41.9 mL/min (A) (by C-G formula based on SCr of 1.3 mg/dL (H)). Liver Function Tests: Recent Labs  Lab 03/03/21 0253 03/04/21 0521  AST 57* 45*  ALT 22 20  ALKPHOS 39 38  BILITOT 1.6* 1.0  PROT 9.1* 7.4  ALBUMIN 2.8* 2.1*   No results for input(s): LIPASE, AMYLASE in the last 168 hours. No results for input(s): AMMONIA in the last 168 hours. Coagulation Profile: Recent Labs  Lab 03/04/21 0521  INR 1.5*   Cardiac  Enzymes: Recent Labs  Lab 03/03/21 0253 03/04/21 0521  CKTOTAL 334* 332*   BNP (last 3 results) No results for input(s): PROBNP in the last 8760 hours. HbA1C: Recent Labs    03/03/21 0253  HGBA1C 5.3   CBG: Recent Labs  Lab 03/03/21 1700 03/03/21 2019 03/04/21 0800 03/04/21 0840 03/04/21 1156  GLUCAP 107* 99 74 98 95   Lipid Profile: No results for input(s): CHOL, HDL, LDLCALC, TRIG, CHOLHDL, LDLDIRECT in the last 72 hours. Thyroid Function Tests: No results  for input(s): TSH, T4TOTAL, FREET4, T3FREE, THYROIDAB in the last 72 hours. Anemia Panel: No results for input(s): VITAMINB12, FOLATE, FERRITIN, TIBC, IRON, RETICCTPCT in the last 72 hours.    Radiology Studies: I have reviewed all of the imaging during this hospital visit personally     Scheduled Meds:  aspirin EC  81 mg Oral Daily   enoxaparin (LOVENOX) injection  40 mg Subcutaneous Q24H   levothyroxine  25 mcg Oral Daily   metoprolol succinate  50 mg Oral Daily   thiamine  100 mg Oral Daily   Continuous Infusions:  cefTRIAXone (ROCEPHIN)  IV 2 g (03/04/21 0515)   metronidazole 500 mg (03/04/21 0919)     LOS: 0 days        Ellie Lunch, student

## 2021-03-05 DIAGNOSIS — E669 Obesity, unspecified: Secondary | ICD-10-CM

## 2021-03-05 DIAGNOSIS — G4733 Obstructive sleep apnea (adult) (pediatric): Secondary | ICD-10-CM

## 2021-03-05 DIAGNOSIS — R7989 Other specified abnormal findings of blood chemistry: Secondary | ICD-10-CM | POA: Diagnosis not present

## 2021-03-05 DIAGNOSIS — L03115 Cellulitis of right lower limb: Secondary | ICD-10-CM | POA: Diagnosis not present

## 2021-03-05 DIAGNOSIS — N179 Acute kidney failure, unspecified: Secondary | ICD-10-CM | POA: Diagnosis not present

## 2021-03-05 LAB — URINALYSIS, ROUTINE W REFLEX MICROSCOPIC
Bilirubin Urine: NEGATIVE
Glucose, UA: NEGATIVE mg/dL
Ketones, ur: 5 mg/dL — AB
Nitrite: NEGATIVE
Protein, ur: 100 mg/dL — AB
Specific Gravity, Urine: 1.028 (ref 1.005–1.030)
pH: 5 (ref 5.0–8.0)

## 2021-03-05 LAB — BASIC METABOLIC PANEL
Anion gap: 8 (ref 5–15)
BUN: 30 mg/dL — ABNORMAL HIGH (ref 8–23)
CO2: 21 mmol/L — ABNORMAL LOW (ref 22–32)
Calcium: 8.4 mg/dL — ABNORMAL LOW (ref 8.9–10.3)
Chloride: 104 mmol/L (ref 98–111)
Creatinine, Ser: 1.22 mg/dL — ABNORMAL HIGH (ref 0.44–1.00)
GFR, Estimated: 47 mL/min — ABNORMAL LOW (ref >60)
Glucose, Bld: 113 mg/dL — ABNORMAL HIGH (ref 70–99)
Potassium: 3.5 mmol/L (ref 3.5–5.1)
Sodium: 133 mmol/L — ABNORMAL LOW (ref 135–145)

## 2021-03-05 LAB — CBC WITH DIFFERENTIAL/PLATELET
Abs Immature Granulocytes: 0.26 10*3/uL — ABNORMAL HIGH (ref 0.00–0.07)
Basophils Absolute: 0.1 10*3/uL (ref 0.0–0.1)
Basophils Relative: 0 %
Eosinophils Absolute: 0 10*3/uL (ref 0.0–0.5)
Eosinophils Relative: 0 %
HCT: 36.6 % (ref 36.0–46.0)
Hemoglobin: 11.5 g/dL — ABNORMAL LOW (ref 12.0–15.0)
Immature Granulocytes: 1 %
Lymphocytes Relative: 4 %
Lymphs Abs: 0.7 10*3/uL (ref 0.7–4.0)
MCH: 25.5 pg — ABNORMAL LOW (ref 26.0–34.0)
MCHC: 31.4 g/dL (ref 30.0–36.0)
MCV: 81.2 fL (ref 80.0–100.0)
Monocytes Absolute: 0.6 10*3/uL (ref 0.1–1.0)
Monocytes Relative: 3 %
Neutro Abs: 16.8 10*3/uL — ABNORMAL HIGH (ref 1.7–7.7)
Neutrophils Relative %: 92 %
Platelets: 183 10*3/uL (ref 150–400)
RBC: 4.51 MIL/uL (ref 3.87–5.11)
RDW: 16.2 % — ABNORMAL HIGH (ref 11.5–15.5)
WBC: 18.5 10*3/uL — ABNORMAL HIGH (ref 4.0–10.5)
nRBC: 0 % (ref 0.0–0.2)

## 2021-03-05 LAB — GLUCOSE, CAPILLARY
Glucose-Capillary: 107 mg/dL — ABNORMAL HIGH (ref 70–99)
Glucose-Capillary: 109 mg/dL — ABNORMAL HIGH (ref 70–99)
Glucose-Capillary: 168 mg/dL — ABNORMAL HIGH (ref 70–99)
Glucose-Capillary: 163 mg/dL — ABNORMAL HIGH (ref 70–99)
Glucose-Capillary: 131 mg/dL — ABNORMAL HIGH (ref 70–99)

## 2021-03-05 LAB — CK: Total CK: 136 U/L (ref 38–234)

## 2021-03-05 MED ORDER — ADULT MULTIVITAMIN W/MINERALS CH
1.0000 | ORAL_TABLET | Freq: Every day | ORAL | Status: DC
  Administered 2021-03-05 – 2021-03-09 (×5): 1 via ORAL
  Filled 2021-03-05 (×5): qty 1

## 2021-03-05 NOTE — Evaluation (Signed)
Occupational Therapy Evaluation Patient Details Name: Michaela Baker MRN: [id number] DOB: 10-Jun-1950 Today's Date: 03/05/2021   History of Present Illness 70 y.o. female presented to the ED after fall at home and remained on floor for 3 hrs unable to get up, admitted for cellulitis and acute kidney injury. PMH significant for type II DM, hypothyroidism, HTN, and unspecified CHF.   Clinical Impression   PTA, pt was living alone and independent in ADLs and IADLs and relied on family for transportation. On evaluation, pt presents with decreased functional strength, activity tolerance, and balance as well as with increased pain and LE edema resulting in decline in functional abilities. Now pt requiring min-mod A to stand, only able to transfer to recliner, and needing assistance to perform ADLs, especially LB ADLs. Pt on 2L initially SpO2 91%, dropping to 87% on room air. Encouraged pt to perform pursed lip breathing, increased to 92%. Left in chair at 94%. Patient will benefit from skilled OT services while in hospital to improve deficits and learn compensatory strategies as needed in order to return to PLOF. Recommending CIR due to pt motivation, PLOF, and need for intensive rehab post-acute stay.      Recommendations for follow up therapy are one component of a multi-disciplinary discharge planning process, led by the attending physician.  Recommendations may be updated based on patient status, additional functional criteria and insurance authorization.   Follow Up Recommendations  CIR    Equipment Recommendations  Other (comment) (defer to next venue)    Recommendations for Other Services       Precautions / Restrictions Precautions Precautions: Fall Precaution Comments: Has R heel wound Restrictions Weight Bearing Restrictions: No      Mobility Bed Mobility Overal bed mobility: Needs Assistance Bed Mobility: Supine to Sit     Supine to sit: Min assist;HOB elevated     General  bed mobility comments: needing assist to push up into sitting    Transfers Overall transfer level: Needs assistance Equipment used: Rolling walker (2 wheeled) Transfers: Stand Pivot Transfers   Stand pivot transfers: +2 physical assistance;From elevated surface;Min assist       General transfer comment: requiring assist to power up, pivot, and to guide into sitting, unsteady on feet. Mod A from low surface.    Balance Overall balance assessment: Needs assistance Sitting-balance support: No upper extremity supported;Feet supported Sitting balance-Leahy Scale: Good Sitting balance - Comments: fair progressing to good, donned socks EOB with no LOBs   Standing balance support: Bilateral upper extremity supported Standing balance-Leahy Scale: Poor Standing balance comment: No LOBs during functional mobility, relies on external support                           ADL either performed or assessed with clinical judgement   ADL Overall ADL's : Needs assistance/impaired Eating/Feeding: Set up;Sitting Eating/Feeding Details (indicate cue type and reason): Needing assitance for container management, can open plastic container with lid, could not open yogurt/cereal plastic wrapping due to decreased lateral and tripod grasp/grip strength Grooming: Set up;Sitting Grooming Details (indicate cue type and reason): set up for washing face in sitting Upper Body Bathing: Set up;Sitting   Lower Body Bathing: Sit to/from stand;Moderate assistance   Upper Body Dressing : Sitting;Minimal assistance   Lower Body Dressing: Sit to/from stand;Maximal assistance Lower Body Dressing Details (indicate cue type and reason): Needing assist to donn socks over BLEs and could pull up to ankles once donned over toes  Toilet Transfer: Moderate assistance;+2 for physical assistance;RW;Stand-pivot   Toileting- Clothing Manipulation and Hygiene: Maximal assistance;Sit to/from stand       Functional  mobility during ADLs: Moderate assistance;+2 for physical assistance;Rolling walker       Vision         Perception     Praxis      Pertinent Vitals/Pain Pain Assessment: Faces Faces Pain Scale: Hurts even more Pain Location: R leg and chronic in Both hands Pain Descriptors / Indicators: Grimacing;Sore Pain Intervention(s): Limited activity within patient's tolerance;Monitored during session     Hand Dominance Right   Extremity/Trunk Assessment Upper Extremity Assessment Upper Extremity Assessment: RUE deficits/detail;LUE deficits/detail;Generalized weakness RUE Deficits / Details: shoulder flexion 2+/5. elbow flexion 3+5, extension 4-/5, grip 4-/5. LUE Deficits / Details: shoulder flexion 2+/5. elbow flexion 3+5, extension 4-/5, grip 3+/5.   Lower Extremity Assessment Lower Extremity Assessment: Defer to PT evaluation   Cervical / Trunk Assessment Cervical / Trunk Assessment: Normal   Communication Communication Communication: No difficulties   Cognition Arousal/Alertness: Awake/alert Behavior During Therapy: WFL for tasks assessed/performed Overall Cognitive Status: Within Functional Limits for tasks assessed                                 General Comments: Pt needing increased time to follow commands and with conversation   General Comments       Exercises     Shoulder Instructions      Home Living Family/patient expects to be discharged to:: Private residence Living Arrangements: Alone Available Help at Discharge: Available PRN/intermittently Type of Home: Apartment Home Access: Level entry     Home Layout: One level     Bathroom Shower/Tub: Teacher, early years/pre: Handicapped height     Home Equipment: Shower seat;Cane - quad;Walker - 2 wheels   Additional Comments: grandkids and kids live close by and can help if needed      Prior Functioning/Environment          Comments: did not walk with asssitive device,  independent in ADL/IADLs, family drove to appointments and groceries.        OT Problem List: Decreased strength;Decreased activity tolerance;Decreased range of motion;Impaired balance (sitting and/or standing);Decreased knowledge of use of DME or AE;Decreased knowledge of precautions;Pain      OT Treatment/Interventions: Self-care/ADL training;Therapeutic exercise;DME and/or AE instruction;Energy conservation;Therapeutic activities;Patient/family education    OT Goals(Current goals can be found in the care plan section) Acute Rehab OT Goals Patient Stated Goal: To get stronger OT Goal Formulation: With patient Time For Goal Achievement: 03/19/21 Potential to Achieve Goals: Good  OT Frequency: Min 2X/week   Barriers to D/C:            Co-evaluation              AM-PAC OT "6 Clicks" Daily Activity     Outcome Measure Help from another person eating meals?: A Little Help from another person taking care of personal grooming?: A Little Help from another person toileting, which includes using toliet, bedpan, or urinal?: A Lot Help from another person bathing (including washing, rinsing, drying)?: A Lot Help from another person to put on and taking off regular upper body clothing?: A Little Help from another person to put on and taking off regular lower body clothing?: A Lot 6 Click Score: 15   End of Session Equipment Utilized During Treatment: Rolling walker;Oxygen Nurse Communication: Mobility status (oxygen)  Activity Tolerance: Patient tolerated treatment well Patient left: in chair;with call bell/phone within reach;with chair alarm set  OT Visit Diagnosis: Unsteadiness on feet (R26.81);Other abnormalities of gait and mobility (R26.89);History of falling (Z91.81);Muscle weakness (generalized) (M62.81);Pain                Time: 1314-3888 OT Time Calculation (min): 45 min Charges:  OT General Charges $OT Visit: 1 Visit OT Evaluation $OT Eval Moderate Complexity: 1  Mod OT Treatments $Self Care/Home Management : 8-22 mins $Therapeutic Activity: 8-22 mins  Jackquline Denmark, OTS Acute Rehab Office: [phone]   Michaela Baker 03/05/2021, 9:07 AM

## 2021-03-05 NOTE — Progress Notes (Signed)
Initial Nutrition Assessment  DOCUMENTATION CODES:  Not applicable  INTERVENTION:  Downgrade diet to Dysphagia 3.  Add double protein to meals.  Add MVI with minerals daily.  Encourage PO intake.  NUTRITION DIAGNOSIS:  Increased nutrient needs related to wound healing as evidenced by estimated needs.  GOAL:  Patient will meet greater than or equal to 90% of their needs  MONITOR:  PO intake, Supplement acceptance, Diet advancement, Labs, Weight trends, Skin, I & O's  REASON FOR ASSESSMENT:  Consult Wound healing  ASSESSMENT:  70 yo female with a PMH of unspecified CHF, HTN, PVCs, T2DM, hypothyroidism, and OSA who presents with AKI and cellulitis of RLE.  Spoke with pt at bedside. Pt in good spirits. She reports eating well, reporting she enjoys jello, chicken, applesauce, veggies, and beans. She reports that she eats 1-2 meals per day with no snacks, though she used to snack.  Per Epic, pt has lost ~28 lbs (13.6%) in the past 11 months, which is not necessarily significant for the time frame.  Pt with limited dentition - RD to downgrade diet to Dysphagia 3.  Recommend double protein portions at meals and adding MVI with minerals.  Medications: reviewed; Synthroid, thiamine   Labs: reviewed; Na 133 (L), CBG 95-149 HbA1c: 5.3% (03/03/2021)  NUTRITION - FOCUSED PHYSICAL EXAM: Flowsheet Row Most Recent Value  Orbital Region Mild depletion  Upper Arm Region No depletion  Thoracic and Lumbar Region No depletion  Buccal Region Mild depletion  Temple Region Mild depletion  Clavicle Bone Region No depletion  Clavicle and Acromion Bone Region No depletion  Scapular Bone Region No depletion  Dorsal Hand No depletion  Patellar Region No depletion  Anterior Thigh Region No depletion  Posterior Calf Region No depletion  Edema (RD Assessment) Moderate  [severe RLE, mild LLE]  Hair Reviewed  Eyes Reviewed  Mouth Reviewed  Skin Reviewed  Nails Reviewed   Diet Order:    Diet Order             DIET DYS 3 Room service appropriate? Yes; Fluid consistency: Thin  Diet effective now                  EDUCATION NEEDS:  Education needs have been addressed  Skin:  Skin Assessment: Skin Integrity Issues: Skin Integrity Issues:: Diabetic Ulcer Diabetic Ulcer: R heel  Last BM:  03/04/21 - Type 7, large  Height:  Ht Readings from Last 1 Encounters:  03/03/21 5\' 5"  (1.651 m)   Weight:  Wt Readings from Last 1 Encounters:  03/03/21 81.5 kg   BMI:  Body mass index is 29.9 kg/m.  Estimated Nutritional Needs:  Kcal:  1850-2050 Protein:  95-110 grams Fluid:  >1.9 L  Derrel Nip, RD, LDN (she/her/hers) Registered Dietitian I After-Hours/Weekend Pager # in [place]

## 2021-03-05 NOTE — Progress Notes (Signed)
PROGRESS NOTE    Michaela Baker  ZHG:[id number] DOB: 11/28/1950 DOA: 03/03/2021 PCP: [name], FNP    Brief Narrative:  [place] was admitted for cellulitis and AKI.   Michaela Baker is a 70 year old female who presented with an AKI, weakness, cellulitis stemming from an untreated chronic right heel wound, who fell and was unable to pick herself up, found by family after several hours. Hx of Dm, htn, hypothyroidism, obesity. Initial presentation showed an AKI, creatinine 1.81.  Initial labs 10/5:  na 133, k 4.6, cl 97, co2 23, creatinine 1.81, bun 38, gfr 30, anion gap 13, wbc 14.6, neutrophils 13.3, ck 334.  Today 10/7: na 133, k 3.5, cl 104, co2 21, creatinine 1.22, bun 30, gfr 47, anion gap 8, wbc 18.5, neutrophils 16.8, ck 136.   Assessment & Plan:   Principal Problem:   AKI (acute kidney injury) ([place]) Active Problems:   HTN (hypertension)   OSA (obstructive sleep apnea)   Hypothyroidism   Class II obesity   Hyponatremia   Abnormal LFTs   Cellulitis of right lower extremity   Sepsis ([place])   Plan.  Acute kidney injury.  Her kidney function shows a serum creatinine 1.22, potassium 3.5 and bicarb 21.   Plan to continue intravenous fluids, saline 75 ml per hr, follow-up kidney function in the morning, avoid hypotension nephrotoxic agents.  Stopping her home lisinopril, lasix, farxiga   2. Left leg cellulitis, complicated with sepsis not present on admission.  10/6 she was noted to be tachypneic and febrile Wbc was up to 26.5 on 10/6, improved to 18.5 on 10/7 [place] with no DVT.    Vancomycin restarted for MRSA coverage Follow up on cell count cultures and temperature curve.    3. HTN.  Continue metoprolol, holding ACE inhibitors or diuretic therapy due to risk of worsening kidney function.   4.  Hypothyroid.  Continue levothyroxine.  5. Hyponatremia 10/5 133, 10/6 134, 10/7 133 Will continue to monitor   Patient continue to be at high risk for septic  shock, further kidney injury  Status is: Inpatient  Remains inpatient appropriate because: Altered mental status, Ongoing diagnostic testing needed not appropriate for outpatient work up, IV treatments appropriate due to intensity of illness or inability to take PO, and Inpatient level of care appropriate due to severity of illness  Dispo: The patient is from: Home              Anticipated d/c is to: SNF              Patient currently is not medically stable to d/c.   Difficult to place patient No       DVT prophylaxis: lovenox  Code Status:   full  Family Communication:  Daughter and son    Nutrition Status: Nutrition Problem: Increased nutrient needs Etiology: wound healing Signs/Symptoms: estimated needs Interventions: Liberalize Diet, Other (Comment), MVI (Double protein)     Skin Documentation:   Right leg continues to be edematous with erythema, right heel wound is bandaged.     Antimicrobials:  Ceftriaxone 10/5-current [place] 10/5-10/6 Vancomycin 10/5, restarted 10/6 - current   Subjective: Slightly confused, awake, alert, without complaints, improved right leg pain  Objective: Vitals:   03/04/21 2130 03/05/21 0447 03/05/21 1002 03/05/21 1259  BP: 139/84 (!) 141/86 (!) 143/72 (!) 146/80  Pulse: 71 73  78  Resp:  12  20  Temp: 98.9 F (37.2 C) 98.7 F (37.1 C) 98 F (36.7  C) 97.9 F (36.6 C)  TempSrc: Oral Oral Oral Oral  SpO2: 94% 95% 93% 96%  Weight:      Height:        Intake/Output Summary (Last 24 hours) at 03/05/2021 1323 Last data filed at 03/05/2021 0900 Gross per 24 hour  Intake 516.26 ml  Output 350 ml  Net 166.26 ml   Filed Weights   03/03/21 1100 03/03/21 1726  Weight: 94.3 kg 81.5 kg    Examination:   General: Not in pain or dyspnea  Neurology: Awake and alert, non focal   E ENT: no pallor, no icterus, oral mucosa moist  Cardiovascular: No JVD. S1-S2 present, rhythmic, no gallops, rubs, or murmurs. Left leg is non  edematous, right leg cellulitis is edematous  Pulmonary: normal breath sounds bilaterally, adequate air movement, no wheezing, rhonchi or rales.  Gastrointestinal. Abdomen flat, no organomegaly, non tender, no rebound or guarding  Skin. No rashes  Musculoskeletal: no joint deformities     Data Reviewed: I have personally reviewed following labs and imaging studies  CBC: Recent Labs  Lab 03/03/21 0253 03/04/21 0521 03/05/21 0505  WBC 14.6* 26.5* 18.5*  NEUTROABS 13.3*  --  16.8*  HGB 12.4 10.7* 11.5*  HCT 40.4 34.6* 36.6  MCV 83.0 82.0 81.2  PLT 165 159 102   Basic Metabolic Panel: Recent Labs  Lab 03/03/21 0253 03/04/21 0521 03/05/21 0505  NA 133* 134* 133*  K 4.6 3.9 3.5  CL 97* 103 104  CO2 23 25 21*  GLUCOSE 88 83 113*  BUN 38* 31* 30*  CREATININE 1.81* 1.30* 1.22*  CALCIUM 8.9 8.3* 8.4*   GFR: Estimated Creatinine Clearance: 44.6 mL/min (A) (by C-G formula based on SCr of 1.22 mg/dL (H)). Liver Function Tests: Recent Labs  Lab 03/03/21 0253 03/04/21 0521  AST 57* 45*  ALT 22 20  ALKPHOS 39 38  BILITOT 1.6* 1.0  PROT 9.1* 7.4  ALBUMIN 2.8* 2.1*   No results for input(s): LIPASE, AMYLASE in the last 168 hours. No results for input(s): AMMONIA in the last 168 hours. Coagulation Profile: Recent Labs  Lab 03/04/21 0521  INR 1.5*   Cardiac Enzymes: Recent Labs  Lab 03/03/21 0253 03/04/21 0521 03/05/21 0505  CKTOTAL 334* 332* 136   BNP (last 3 results) No results for input(s): PROBNP in the last 8760 hours. HbA1C: Recent Labs    03/03/21 0253  HGBA1C 5.3   CBG: Recent Labs  Lab 03/04/21 1715 03/04/21 2037 03/05/21 0115 03/05/21 0809 03/05/21 1152  GLUCAP 149* 132* 107* 109* 168*   Lipid Profile: No results for input(s): CHOL, HDL, LDLCALC, TRIG, CHOLHDL, LDLDIRECT in the last 72 hours. Thyroid Function Tests: No results for input(s): TSH, T4TOTAL, FREET4, T3FREE, THYROIDAB in the last 72 hours. Anemia Panel: No results for  input(s): VITAMINB12, FOLATE, FERRITIN, TIBC, IRON, RETICCTPCT in the last 72 hours.    Radiology Studies: I have reviewed all of the imaging during this hospital visit personally     Scheduled Meds:  aspirin EC  81 mg Oral Daily   enoxaparin (LOVENOX) injection  40 mg Subcutaneous Q24H   levothyroxine  25 mcg Oral Daily   metoprolol succinate  50 mg Oral Daily   multivitamin with minerals  1 tablet Oral Daily   thiamine  100 mg Oral Daily   Continuous Infusions:  sodium chloride 75 mL/hr at 03/05/21 0705   cefTRIAXone (ROCEPHIN)  IV 2 g (03/05/21 0504)   vancomycin 1,000 mg (03/04/21 1727)  LOS: 1 day        Ellie Lunch, MD

## 2021-03-05 NOTE — Evaluation (Signed)
.Physical Therapy Evaluation Patient Details Name: Michaela Baker MRN: [id number] DOB: 24-Jan-1951 Today's Date: 03/05/2021  History of Present Illness  70 y.o. female presented to the ED after fall at home and remained on floor for 3 hrs unable to get up, admitted for cellulitis and acute kidney injury. PMH significant for type II DM, hypothyroidism, HTN, and unspecified CHF.   Clinical Impression  Pt is a 70 y.o. female with above HPI resulting in the deficits listed below (see PT Problem List). Pt performed  supine to sit with Min A and increased time. Sit to stand transfers with MOD A +2 for power up to stand and cues for safe hand placement. Pt was able to take ~6 side steps with MIN A+2 progressing to MIN A+1 to recliner. Recommend CIR, pt is motivated to return to prior level of independence and child and grandchidren available to assist upon d/c. Pt is presenting below baseline mobility level and will benefit from skilled PT to maximize safety with functional mobility and increase independence.         Recommendations for follow up therapy are one component of a multi-disciplinary discharge planning process, led by the attending physician.  Recommendations may be updated based on patient status, additional functional criteria and insurance authorization.  Follow Up Recommendations CIR    Equipment Recommendations  Other (comment) (defer to next venue of care)    Recommendations for Other Services       Precautions / Restrictions Precautions Precautions: Fall Precaution Comments: Has R heel wound Restrictions Weight Bearing Restrictions: No      Mobility  Bed Mobility Overal bed mobility: Needs Assistance Bed Mobility: Supine to Sit     Supine to sit: Min assist;HOB elevated     General bed mobility comments: MIN A and extra time to bring R LE to EOB and to bring trunk to upright    Transfers Overall transfer level: Needs assistance Equipment used: Rolling walker (2  wheeled) Transfers: Sit to/from Stand Sit to Stand: Mod assist;+2 physical assistance;+2 safety/equipment         General transfer comment: MOD A +2 to power up from stand with increased time to come into full erect posture. Cues provided for safe hand placement and assist for RW stability. Pt able to take ~6 side steps with MIN A+2 progressing to MIN A+1 to recliner with intermittent manual assist for RW management.  Ambulation/Gait                Stairs            Wheelchair Mobility    Modified Rankin (Stroke Patients Only)       Balance Overall balance assessment: Needs assistance Sitting-balance support: No upper extremity supported;Feet supported Sitting balance-Leahy Scale: Good     Standing balance support: Bilateral upper extremity supported Standing balance-Leahy Scale: Poor Standing balance comment: use of external support                             Pertinent Vitals/Pain Pain Assessment: No/denies pain    Home Living Family/patient expects to be discharged to:: Private residence Living Arrangements: Alone Available Help at Discharge: Available PRN/intermittently Type of Home: Apartment Home Access: Level entry     Home Layout: One level Home Equipment: Shower seat;Cane - quad;Walker - 2 wheels Additional Comments: Children and grandchildren live close by and can assist if needed. Pt reports they normally check in on her every day  Prior Function Level of Independence: Independent               Hand Dominance   Dominant Hand: Right    Extremity/Trunk Assessment   Upper Extremity Assessment Upper Extremity Assessment: Defer to OT evaluation    Lower Extremity Assessment Lower Extremity Assessment: Generalized weakness    Cervical / Trunk Assessment Cervical / Trunk Assessment: Normal  Communication   Communication: No difficulties  Cognition Arousal/Alertness: Awake/alert Behavior During Therapy: WFL for  tasks assessed/performed Overall Cognitive Status: Within Functional Limits for tasks assessed                                 General Comments: Pt needing increased time and repetition for command following and easily distracted with conversation      General Comments      Exercises     Assessment/Plan    PT Assessment Patient needs continued PT services  PT Problem List Decreased strength;Decreased activity tolerance;Decreased balance;Decreased mobility;Decreased knowledge of use of DME       PT Treatment Interventions DME instruction;Gait training;Functional mobility training;Therapeutic activities;Therapeutic exercise;Patient/family education;Balance training    PT Goals (Current goals can be found in the Care Plan section)  Acute Rehab PT Goals Patient Stated Goal: To get stronger PT Goal Formulation: With patient Time For Goal Achievement: 03/19/21 Potential to Achieve Goals: Good    Frequency Min 3X/week   Barriers to discharge        Co-evaluation               AM-PAC PT "6 Clicks" Mobility  Outcome Measure Help needed turning from your back to your side while in a flat bed without using bedrails?: None Help needed moving from lying on your back to sitting on the side of a flat bed without using bedrails?: A Little Help needed moving to and from a bed to a chair (including a wheelchair)?: A Lot Help needed standing up from a chair using your arms (e.g., wheelchair or bedside chair)?: A Lot Help needed to walk in hospital room?: A Lot Help needed climbing 3-5 steps with a railing? : A Lot 6 Click Score: 15    End of Session Equipment Utilized During Treatment: Gait belt Activity Tolerance: Patient tolerated treatment well Patient left: in chair;with call bell/phone within reach;with chair alarm set Nurse Communication: Mobility status PT Visit Diagnosis: Unsteadiness on feet (R26.81);Muscle weakness (generalized) (M62.81);Difficulty in  walking, not elsewhere classified (R26.2)    Time: 7517-0017 PT Time Calculation (min) (ACUTE ONLY): 21 min   Charges:   PT Evaluation $PT Eval Low Complexity: 1 Low          Festus Barren PT, DPT  Acute Rehabilitation Services  Office [phone]  03/05/2021, 3:37 PM

## 2021-03-06 DIAGNOSIS — L03115 Cellulitis of right lower limb: Secondary | ICD-10-CM | POA: Diagnosis not present

## 2021-03-06 DIAGNOSIS — I10 Essential (primary) hypertension: Secondary | ICD-10-CM | POA: Diagnosis not present

## 2021-03-06 DIAGNOSIS — E871 Hypo-osmolality and hyponatremia: Secondary | ICD-10-CM | POA: Diagnosis not present

## 2021-03-06 DIAGNOSIS — N179 Acute kidney failure, unspecified: Secondary | ICD-10-CM | POA: Diagnosis not present

## 2021-03-06 LAB — CBC WITH DIFFERENTIAL/PLATELET
Abs Immature Granulocytes: 0.29 10*3/uL — ABNORMAL HIGH (ref 0.00–0.07)
Basophils Absolute: 0.1 10*3/uL (ref 0.0–0.1)
Basophils Relative: 0 %
Eosinophils Absolute: 0 10*3/uL (ref 0.0–0.5)
Eosinophils Relative: 0 %
HCT: 39.2 % (ref 36.0–46.0)
Hemoglobin: 12.4 g/dL (ref 12.0–15.0)
Immature Granulocytes: 2 %
Lymphocytes Relative: 9 %
Lymphs Abs: 1.1 10*3/uL (ref 0.7–4.0)
MCH: 25.5 pg — ABNORMAL LOW (ref 26.0–34.0)
MCHC: 31.6 g/dL (ref 30.0–36.0)
MCV: 80.7 fL (ref 80.0–100.0)
Monocytes Absolute: 0.8 10*3/uL (ref 0.1–1.0)
Monocytes Relative: 6 %
Neutro Abs: 10.5 10*3/uL — ABNORMAL HIGH (ref 1.7–7.7)
Neutrophils Relative %: 83 %
Platelets: 291 10*3/uL (ref 150–400)
RBC: 4.86 MIL/uL (ref 3.87–5.11)
RDW: 16.2 % — ABNORMAL HIGH (ref 11.5–15.5)
WBC: 12.7 10*3/uL — ABNORMAL HIGH (ref 4.0–10.5)
nRBC: 0 % (ref 0.0–0.2)

## 2021-03-06 LAB — GLUCOSE, CAPILLARY
Glucose-Capillary: 105 mg/dL — ABNORMAL HIGH (ref 70–99)
Glucose-Capillary: 96 mg/dL (ref 70–99)
Glucose-Capillary: 116 mg/dL — ABNORMAL HIGH (ref 70–99)
Glucose-Capillary: 91 mg/dL (ref 70–99)

## 2021-03-06 LAB — BASIC METABOLIC PANEL
Anion gap: 8 (ref 5–15)
BUN: 33 mg/dL — ABNORMAL HIGH (ref 8–23)
CO2: 21 mmol/L — ABNORMAL LOW (ref 22–32)
Calcium: 8.4 mg/dL — ABNORMAL LOW (ref 8.9–10.3)
Chloride: 106 mmol/L (ref 98–111)
Creatinine, Ser: 1.12 mg/dL — ABNORMAL HIGH (ref 0.44–1.00)
GFR, Estimated: 53 mL/min — ABNORMAL LOW (ref >60)
Glucose, Bld: 108 mg/dL — ABNORMAL HIGH (ref 70–99)
Potassium: 3.6 mmol/L (ref 3.5–5.1)
Sodium: 135 mmol/L (ref 135–145)

## 2021-03-06 MED ORDER — TRAZODONE HCL 50 MG PO TABS
50.0000 mg | ORAL_TABLET | Freq: Once | ORAL | Status: AC
  Administered 2021-03-06: 50 mg via ORAL
  Filled 2021-03-06: qty 1

## 2021-03-06 NOTE — Progress Notes (Signed)
PROGRESS NOTE    JOYCELINE Baker  EQA:[id number] DOB: [birth year] DOA: 03/03/2021 PCP: Michaela Drafts, FNP    Brief Narrative:  Michaela Baker was admitted to the hospital with the working diagnosis of left lower extremity cellulitis, complicated with sepsis.   70 year old female past medical history for heart failure, hypertension, class II obesity, peripheral vascular disease, type 2 diabetes mellitus, hypothyroidism and obstructive sleep apnea who presented after mechanical fall.  Apparently she laid in the floor for about 3 hours unable to stand back up.  She has been not feeling well for the last several days.  She has a right foot wound that has been chronic.  On her initial physical examination temperature 98.8, heart rate 102, respiratory rate 18, blood pressure 112/59, oxygen saturation 94% on room air.  She was ill-looking appearing, dry mucous membranes, lungs were clear to auscultation bilaterally, heart S1-S2, irregular, abdomen protuberant but nontender, right lower extremity with edema, tender to palpation at the ankle.  Positive stage II ulcer at the heel plantar region.   Sodium 133, potassium 4.6, chloride 97, bicarb 23, glucose 88, BUN 38, creatinine 1.81, AST 57, ALT 22, white count 14.6, hemoglobin 12.4, hematocrit 40.4, platelets 165.   Patient had persistent fever and worsening leukocytosis. Added Vancomycin to cover MRSA, sepsis syndrome.   With broad spectrum antibiotic her sepsis syndrome has improved.  Cellulitis clinically improving.  Patient has developed mild confusion and urinary retention.   Assessment & Plan:   Principal Problem:   AKI (acute kidney injury) ([place]) Active Problems:   HTN (hypertension)   OSA (obstructive sleep apnea)   Hypothyroidism   Class II obesity   Hyponatremia   Abnormal LFTs   Cellulitis of right lower extremity   Sepsis ([place])   Acute kidney injury, hyponatremia Patient has been tolerating po well. Renal function and  electrolyte is pending for today. Continue volume repletion with isotonic saline.  Follow up renal function in am, avoid hypotension or nephrotoxic medications.    2. Left leg cellulitis, complicated with sepsis not present on admission.  Left lower extremity US with no DVT.   Clinically improved edema and erythema, continue antibiotic therapy with ceftriaxone and vancomycin Follow on cell count and clinical response.  At discharge continue with oral antibiotic therapy.    3. HTN.  On metoprolol for blood pressure control.   Continue holding ACE inhibitors or diuretic therapy due to risk of worsening kidney function.   4.  Hypothyroid.  Continue with levothyroxine  5. Acute metabolic encephalopathy. Acute presentation likely related to acute illness and hospitalization. Continue close neuro checks, along with thiamine and multivitamins. In and out for urinary retention.   Patient continue to be at high risk for worsening sepsis   Status is: Inpatient  Remains inpatient appropriate because:Inpatient level of care appropriate due to severity of illness  Dispo: The patient is from: Home              Anticipated d/c is to: CIR              Patient currently is not medically stable to d/c.   Difficult to place patient No  DVT prophylaxis: Enoxaparin   Code Status:    full  Family Communication:  I spoke over the phone with the patient's daughter about patient's  condition, plan of care, prognosis and all questions were addressed.      Nutrition Status: Nutrition Problem: Increased nutrient needs Etiology: wound healing Signs/Symptoms: estimated needs Interventions: Liberalize  Diet, Other (Comment), MVI (Double protein)    Antimicrobials:  Ceftriaxone and vancomycin     Subjective: Patient has been feeling better, but has been noted to be confused, not agitated. Tolerating po well. Positive urinary retention.   Objective: Vitals:   03/05/21 1002 03/05/21 1259 03/05/21  2014 03/06/21 0425  BP: (!) 143/72 (!) 146/80 137/75 134/79  Pulse:  78 86 92  Resp:  20 18 18   Temp: 98 F (36.7 C) 97.9 F (36.6 C) 98.1 F (36.7 C) 98.7 F (37.1 C)  TempSrc: Oral Oral Oral Oral  SpO2: 93% 96% 92% 94%  Weight:      Height:        Intake/Output Summary (Last 24 hours) at 03/06/2021 1354 Last data filed at 03/06/2021 0935 Gross per 24 hour  Intake 400 ml  Output --  Net 400 ml   Filed Weights   03/03/21 1100 03/03/21 1726  Weight: 94.3 kg 81.5 kg    Examination:   General: Not in pain or dyspnea, deconditioned  Neurology: Awake and alert, non focal  E ENT: mild pallor, no icterus, oral mucosa moist Cardiovascular: No JVD. S1-S2 present, rhythmic, no gallops, rubs, or murmurs. left lower extremity edema. Non pitting.  Pulmonary: positive breath sounds bilaterally, adequate air movement, no wheezing, rhonchi or rales. Gastrointestinal. Abdomen soft and non tender Skin. No rashes Musculoskeletal: no joint deformities     Data Reviewed: I have personally reviewed following labs and imaging studies  CBC: Recent Labs  Lab 03/03/21 0253 03/04/21 0521 03/05/21 0505 03/06/21 1321  WBC 14.6* 26.5* 18.5* 12.7*  NEUTROABS 13.3*  --  16.8* 10.5*  HGB 12.4 10.7* 11.5* 12.4  HCT 40.4 34.6* 36.6 39.2  MCV 83.0 82.0 81.2 80.7  PLT 165 159 183 774   Basic Metabolic Panel: Recent Labs  Lab 03/03/21 0253 03/04/21 0521 03/05/21 0505  NA 133* 134* 133*  K 4.6 3.9 3.5  CL 97* 103 104  CO2 23 25 21*  GLUCOSE 88 83 113*  BUN 38* 31* 30*  CREATININE 1.81* 1.30* 1.22*  CALCIUM 8.9 8.3* 8.4*   GFR: Estimated Creatinine Clearance: 44.6 mL/min (A) (by C-G formula based on SCr of 1.22 mg/dL (H)). Liver Function Tests: Recent Labs  Lab 03/03/21 0253 03/04/21 0521  AST 57* 45*  ALT 22 20  ALKPHOS 39 38  BILITOT 1.6* 1.0  PROT 9.1* 7.4  ALBUMIN 2.8* 2.1*   No results for input(s): LIPASE, AMYLASE in the last 168 hours. No results for input(s):  AMMONIA in the last 168 hours. Coagulation Profile: Recent Labs  Lab 03/04/21 0521  INR 1.5*   Cardiac Enzymes: Recent Labs  Lab 03/03/21 0253 03/04/21 0521 03/05/21 0505  CKTOTAL 334* 332* 136   BNP (last 3 results) No results for input(s): PROBNP in the last 8760 hours. HbA1C: No results for input(s): HGBA1C in the last 72 hours. CBG: Recent Labs  Lab 03/05/21 1152 03/05/21 1654 03/05/21 2153 03/06/21 0758 03/06/21 1221  GLUCAP 168* 163* 131* 105* 96   Lipid Profile: No results for input(s): CHOL, HDL, LDLCALC, TRIG, CHOLHDL, LDLDIRECT in the last 72 hours. Thyroid Function Tests: No results for input(s): TSH, T4TOTAL, FREET4, T3FREE, THYROIDAB in the last 72 hours. Anemia Panel: No results for input(s): VITAMINB12, FOLATE, FERRITIN, TIBC, IRON, RETICCTPCT in the last 72 hours.    Radiology Studies: I have reviewed all of the imaging during this hospital visit personally     Scheduled Meds:  aspirin EC  81 mg Oral  Daily   enoxaparin (LOVENOX) injection  40 mg Subcutaneous Q24H   levothyroxine  25 mcg Oral Daily   metoprolol succinate  50 mg Oral Daily   multivitamin with minerals  1 tablet Oral Daily   thiamine  100 mg Oral Daily   Continuous Infusions:  sodium chloride 75 mL/hr at 03/05/21 2337   cefTRIAXone (ROCEPHIN)  IV 2 g (03/06/21 0516)   vancomycin 1,000 mg (03/05/21 1651)     LOS: 2 days        Inessa Wardrop Gerome Apley, MD

## 2021-03-06 NOTE — Plan of Care (Signed)
  Problem: Education: Goal: Knowledge of General Education information will improve Description: Including pain rating scale, medication(s)/side effects and non-pharmacologic comfort measures Outcome: Progressing   Problem: Health Behavior/Discharge Planning: Goal: Ability to manage health-related needs will improve Outcome: Progressing   Problem: Clinical Measurements: Goal: Ability to maintain clinical measurements within normal limits will improve Outcome: Progressing Goal: Will remain free from infection Outcome: Progressing Goal: Diagnostic test results will improve Outcome: Progressing   Problem: Activity: Goal: Risk for activity intolerance will decrease Outcome: Progressing   Problem: Nutrition: Goal: Adequate nutrition will be maintained Outcome: Progressing   Problem: Pain Managment: Goal: General experience of comfort will improve Outcome: Progressing   Problem: Safety: Goal: Ability to remain free from injury will improve Outcome: Progressing   Problem: Skin Integrity: Goal: Risk for impaired skin integrity will decrease Outcome: Progressing

## 2021-03-07 DIAGNOSIS — N179 Acute kidney failure, unspecified: Secondary | ICD-10-CM | POA: Diagnosis not present

## 2021-03-07 DIAGNOSIS — L03115 Cellulitis of right lower limb: Secondary | ICD-10-CM | POA: Diagnosis not present

## 2021-03-07 DIAGNOSIS — E871 Hypo-osmolality and hyponatremia: Secondary | ICD-10-CM | POA: Diagnosis not present

## 2021-03-07 DIAGNOSIS — E669 Obesity, unspecified: Secondary | ICD-10-CM | POA: Diagnosis not present

## 2021-03-07 LAB — CBC
HCT: 37.2 % (ref 36.0–46.0)
Hemoglobin: 11.7 g/dL — ABNORMAL LOW (ref 12.0–15.0)
MCH: 25.1 pg — ABNORMAL LOW (ref 26.0–34.0)
MCHC: 31.5 g/dL (ref 30.0–36.0)
MCV: 79.7 fL — ABNORMAL LOW (ref 80.0–100.0)
Platelets: 316 10*3/uL (ref 150–400)
RBC: 4.67 MIL/uL (ref 3.87–5.11)
RDW: 16.4 % — ABNORMAL HIGH (ref 11.5–15.5)
WBC: 11.3 10*3/uL — ABNORMAL HIGH (ref 4.0–10.5)
nRBC: 0 % (ref 0.0–0.2)

## 2021-03-07 LAB — GLUCOSE, CAPILLARY
Glucose-Capillary: 84 mg/dL (ref 70–99)
Glucose-Capillary: 84 mg/dL (ref 70–99)
Glucose-Capillary: 98 mg/dL (ref 70–99)

## 2021-03-07 LAB — BASIC METABOLIC PANEL
Anion gap: 10 (ref 5–15)
BUN: 27 mg/dL — ABNORMAL HIGH (ref 8–23)
CO2: 20 mmol/L — ABNORMAL LOW (ref 22–32)
Calcium: 8.8 mg/dL — ABNORMAL LOW (ref 8.9–10.3)
Chloride: 111 mmol/L (ref 98–111)
Creatinine, Ser: 0.99 mg/dL (ref 0.44–1.00)
GFR, Estimated: >60 mL/min (ref >60)
Glucose, Bld: 91 mg/dL (ref 70–99)
Potassium: 3.6 mmol/L (ref 3.5–5.1)
Sodium: 141 mmol/L (ref 135–145)

## 2021-03-07 MED ORDER — AMLODIPINE BESYLATE 10 MG PO TABS
10.0000 mg | ORAL_TABLET | Freq: Every day | ORAL | Status: DC
  Administered 2021-03-07 – 2021-03-09 (×3): 10 mg via ORAL
  Filled 2021-03-07 (×3): qty 1

## 2021-03-07 MED ORDER — MUPIROCIN 2 % EX OINT
TOPICAL_OINTMENT | Freq: Two times a day (BID) | CUTANEOUS | Status: DC
  Filled 2021-03-07 (×2): qty 22

## 2021-03-07 NOTE — Progress Notes (Signed)
PROGRESS NOTE    Michaela Baker  TKZ:[id number] DOB: Oct 15, 1950 DOA: 03/03/2021 PCP: [name], FNP    Brief Narrative:  Michaela Baker was admitted to the hospital with the working diagnosis of left lower extremity cellulitis, complicated with sepsis.    70 year old female past medical history for heart failure, hypertension, class II obesity, peripheral vascular disease, type 2 diabetes mellitus, hypothyroidism and obstructive sleep apnea who presented after mechanical fall.  Apparently she laid in the floor for about 3 hours unable to stand back up.  She has been not feeling well for the last several days.  She has a right foot wound that has been chronic.  On her initial physical examination temperature 98.8, heart rate 102, respiratory rate 18, blood pressure 112/59, oxygen saturation 94% on room air.  She was ill-looking appearing, dry mucous membranes, lungs were clear to auscultation bilaterally, heart S1-S2, irregular, abdomen protuberant but nontender, right lower extremity with edema, tender to palpation at the ankle.  Positive stage II ulcer at the heel plantar region.   Sodium 133, potassium 4.6, chloride 97, bicarb 23, glucose 88, BUN 38, creatinine 1.81, AST 57, ALT 22, white count 14.6, hemoglobin 12.4, hematocrit 40.4, platelets 165.   Patient had persistent fever and worsening leukocytosis. Added Vancomycin to cover MRSA, sepsis syndrome.    With broad spectrum antibiotic her sepsis syndrome has improved.  Cellulitis clinically improving.   Patient has developed mild confusion and urinary retention.    Assessment & Plan:   Principal Problem:   AKI (acute kidney injury) ([place]) Active Problems:   HTN (hypertension)   OSA (obstructive sleep apnea)   Hypothyroidism   Class II obesity   Hyponatremia   Abnormal LFTs   Cellulitis of right lower extremity   Sepsis ([place])     Acute kidney injury, hyponatremia Renal function with serum cr at 0,99 with K at 3,6 and  serum bicarbonate at 20. Continue close follow up of renal function and electrolytes. Patient is tolerating po well.  Discontinue IV fluids.    2. Left leg cellulitis, complicated with sepsis not present on admission.  Left lower extremity US with no DVT.    Slowly improving cellulitis will continue with IV Ceftriaxone and vancomycin. Possible transition to oral antibiotic therapy in the next 24 hours (Augmentin and doxycycline).   Cell count trending down to 11.3.  3. HTN.  Continue blood pressure control with metoprolol, will resume amlodipine 10 mg and will continue to hold on furosemide and lisinopril for now.   4.  Hypothyroid.  On levothyroxine   5. Acute metabolic encephalopathy. Acute presentation likely related to acute illness and hospitalization. Continue close neuro checks, along with thiamine and multivitamins.  No urinary tract infection. Continue to have some confusion but not agitation. Continue to be very weak and deconditioned will consult inpatient rehab.  Dysphagia 3 diet with aspiration precautions.   Status is: Inpatient  Remains inpatient appropriate because:Inpatient level of care appropriate due to severity of illness  Dispo: The patient is from: Home              Anticipated d/c is to: CIR              Patient currently is not medically stable to d/c.   Difficult to place patient No  DVT prophylaxis: Enoxaparin   Code Status:    full  Family Communication:  I spoke with patient's daughter at the bedside, we talked in detail about patient's condition, plan of  care and prognosis and all questions were addressed.      Nutrition Status: Nutrition Problem: Increased nutrient needs Etiology: wound healing Signs/Symptoms: estimated needs Interventions: Liberalize Diet, Other (Comment), MVI (Double protein)     Antimicrobials:  Ceftriaxone and vancomycin     Subjective: Patient with no nausea or vomiting, no chest pain or dyspnea. Tolerating po  well. Continue to be very weak and deconditioned. Positive confusion   Objective: Vitals:   03/07/21 0408 03/07/21 1105 03/07/21 1215 03/07/21 1356  BP: (!) 153/70 (!) 145/79  120/88  Pulse: 91 90  80  Resp: 20   17  Temp: 98.4 F (36.9 C)   99 F (37.2 C)  TempSrc: Oral     SpO2: 96%  91% 90%  Weight:      Height:        Intake/Output Summary (Last 24 hours) at 03/07/2021 1401 Last data filed at 03/07/2021 0500 Gross per 24 hour  Intake --  Output 1400 ml  Net -1400 ml   Filed Weights   03/03/21 1100 03/03/21 1726  Weight: 94.3 kg 81.5 kg    Examination:   General: Not in pain or dyspnea, deconditioned  Neurology: Awake and alert, non focal  E ENT: mild pallor, no icterus, oral mucosa moist Cardiovascular: No JVD. S1-S2 present, rhythmic, no gallops, rubs, or murmurs. Positive lower extremity edema ++ pitting.  Pulmonary: positive breath sounds bilaterally, with no wheezing, rhonchi or rales. Gastrointestinal. Abdomen soft and non tender Skin. No rashes Musculoskeletal: no joint deformities     Data Reviewed: I have personally reviewed following labs and imaging studies  CBC: Recent Labs  Lab 03/03/21 0253 03/04/21 0521 03/05/21 0505 03/06/21 1321 03/07/21 0525  WBC 14.6* 26.5* 18.5* 12.7* 11.3*  NEUTROABS 13.3*  --  16.8* 10.5*  --   HGB 12.4 10.7* 11.5* 12.4 11.7*  HCT 40.4 34.6* 36.6 39.2 37.2  MCV 83.0 82.0 81.2 80.7 79.7*  PLT 165 159 183 291 175   Basic Metabolic Panel: Recent Labs  Lab 03/03/21 0253 03/04/21 0521 03/05/21 0505 03/06/21 1321 03/07/21 0525  NA 133* 134* 133* 135 141  K 4.6 3.9 3.5 3.6 3.6  CL 97* 103 104 106 111  CO2 23 25 21* 21* 20*  GLUCOSE 88 83 113* 108* 91  BUN 38* 31* 30* 33* 27*  CREATININE 1.81* 1.30* 1.22* 1.12* 0.99  CALCIUM 8.9 8.3* 8.4* 8.4* 8.8*   GFR: Estimated Creatinine Clearance: 55 mL/min (by C-G formula based on SCr of 0.99 mg/dL). Liver Function Tests: Recent Labs  Lab 03/03/21 0253  03/04/21 0521  AST 57* 45*  ALT 22 20  ALKPHOS 39 38  BILITOT 1.6* 1.0  PROT 9.1* 7.4  ALBUMIN 2.8* 2.1*   No results for input(s): LIPASE, AMYLASE in the last 168 hours. No results for input(s): AMMONIA in the last 168 hours. Coagulation Profile: Recent Labs  Lab 03/04/21 0521  INR 1.5*   Cardiac Enzymes: Recent Labs  Lab 03/03/21 0253 03/04/21 0521 03/05/21 0505  CKTOTAL 334* 332* 136   BNP (last 3 results) No results for input(s): PROBNP in the last 8760 hours. HbA1C: No results for input(s): HGBA1C in the last 72 hours. CBG: Recent Labs  Lab 03/06/21 1221 03/06/21 1647 03/06/21 2121 03/07/21 0800 03/07/21 1148  GLUCAP 96 116* 91 84 84   Lipid Profile: No results for input(s): CHOL, HDL, LDLCALC, TRIG, CHOLHDL, LDLDIRECT in the last 72 hours. Thyroid Function Tests: No results for input(s): TSH, T4TOTAL, FREET4, T3FREE, THYROIDAB  in the last 72 hours. Anemia Panel: No results for input(s): VITAMINB12, FOLATE, FERRITIN, TIBC, IRON, RETICCTPCT in the last 72 hours.    Radiology Studies: I have reviewed all of the imaging during this hospital visit personally     Scheduled Meds:  aspirin EC  81 mg Oral Daily   enoxaparin (LOVENOX) injection  40 mg Subcutaneous Q24H   levothyroxine  25 mcg Oral Daily   metoprolol succinate  50 mg Oral Daily   multivitamin with minerals  1 tablet Oral Daily   mupirocin ointment   Topical BID   thiamine  100 mg Oral Daily   Continuous Infusions:  sodium chloride 50 mL/hr at 03/07/21 0525   cefTRIAXone (ROCEPHIN)  IV 2 g (03/07/21 0531)   vancomycin 1,000 mg (03/06/21 1719)     LOS: 3 days        Jakyria Bleau Gerome Apley, MD

## 2021-03-07 NOTE — Progress Notes (Signed)
   03/07/21 2122  Vitals  Temp 99.5 F (37.5 C)  Temp Source Oral  BP (!) 122/51  MAP (mmHg) 71  BP Location Right Arm  BP Method Automatic  Patient Position (if appropriate) Lying  Pulse Rate (!) 38  Pulse Rate Source Monitor  Resp 16  MEWS COLOR  MEWS Score Color Yellow  Oxygen Therapy  SpO2 94 %  O2 Device Room Air  MEWS Score  MEWS Temp 0  MEWS Systolic 0  MEWS Pulse 2  MEWS RR 0  MEWS LOC 0  MEWS Score 2  YELLOW mews triggered d/t low HR & low O2 sat ( which is irregular) Oxygen allpied,  vital signs rechecked after personal care provided and HR remains irregular ,

## 2021-03-07 NOTE — Consult Note (Signed)
WOC Nurse Consult Note: Patient receiving care in [place] Reason for Consult: chronic right heel diabetic wound Wound type: neuropathic, diabetic Pressure Injury POA: Yes/No/NA Measurement: see flowsheet Wound bed: pink, shallow, see photo Drainage (amount, consistency, odor) minimal serosanginous per documentation Periwound: dry, flaking Dressing procedure/placement/frequency:  Wash right heel wound with soap and water, pat dry. Apply mupirocin, cover with dry gauze, tape in place.  Thank you for the consult. [place] nurse will not follow at this time.  Please re-consult the [facility] team if needed.  Val Riles, RN, MSN, CWOCN, CNS-BC, pager [phone]

## 2021-03-08 DIAGNOSIS — L03115 Cellulitis of right lower limb: Secondary | ICD-10-CM | POA: Diagnosis not present

## 2021-03-08 DIAGNOSIS — N179 Acute kidney failure, unspecified: Secondary | ICD-10-CM | POA: Diagnosis not present

## 2021-03-08 DIAGNOSIS — I10 Essential (primary) hypertension: Secondary | ICD-10-CM | POA: Diagnosis not present

## 2021-03-08 DIAGNOSIS — E871 Hypo-osmolality and hyponatremia: Secondary | ICD-10-CM | POA: Diagnosis not present

## 2021-03-08 LAB — GLUCOSE, CAPILLARY
Glucose-Capillary: 86 mg/dL (ref 70–99)
Glucose-Capillary: 103 mg/dL — ABNORMAL HIGH (ref 70–99)
Glucose-Capillary: 116 mg/dL — ABNORMAL HIGH (ref 70–99)
Glucose-Capillary: 86 mg/dL (ref 70–99)

## 2021-03-08 MED ORDER — FUROSEMIDE 40 MG PO TABS
40.0000 mg | ORAL_TABLET | Freq: Every day | ORAL | Status: DC
  Administered 2021-03-08 – 2021-03-16 (×9): 40 mg via ORAL
  Filled 2021-03-08 (×10): qty 1

## 2021-03-08 NOTE — Progress Notes (Signed)
Pharmacy Antibiotic Note  Michaela Baker is a 70 y.o. female admitted on 03/03/2021 with cellulitis.  Pharmacy was consulted for vancomycin dosing.  Today, 03/08/2021: Current antibiotic regimen Vancomycin 1 gram IV q24h (AUC 500.7 using SCr 1.30) Ceftriaxone 2 grams IV q24h SCr has steadily improved, now 0.99  Progress note from attending MD 03/07/21 indicates that given improvement in cellulitis, may consider changing to oral antibiotics (Augmentin + doxycycline).  Plan: Await MD decision on whether can change to oral antibiotics today. ==========================================   Height: 5\' 5"  (165.1 cm) Weight: 81.5 kg (179 lb 10.8 oz) IBW/kg (Calculated) : 57  Temp (24hrs), Avg:99 F (37.2 C), Min:98.6 F (37 C), Max:99.5 F (37.5 C)  Recent Labs  Lab 03/03/21 0253 03/04/21 0521 03/05/21 0505 03/06/21 1321 03/07/21 0525  WBC 14.6* 26.5* 18.5* 12.7* 11.3*  CREATININE 1.81* 1.30* 1.22* 1.12* 0.99     Estimated Creatinine Clearance: 55 mL/min (by C-G formula based on SCr of 0.99 mg/dL).    No Known Allergies  Antimicrobials this admission:  10/5 vanc x 1 dose , resume 10/6 >>  10/5 CTX >>[10/11] 10/5 Flagyl >> 10/6   Dose adjustments this admission:   Microbiology results: 10/6 Blood: ngtd  Clayburn Pert, PharmD, [place] [phone] 03/08/2021  10:43 AM   .

## 2021-03-08 NOTE — Progress Notes (Signed)
Inpatient Rehab Admissions Coordinator:   I spoke with Pt's son and daughter regarding CIR admission. They are interested and state family plans to rotate to provide care at d/c. I do think it is unlikely that Pt.'s insurance will approve CIR for her diagnoses, but family wishes to try to get approval.I will follow and pursue for potential admit pending insurance auth and bed availability.   Clemens Catholic, [place], [place] Admissions Coordinator  [phone] ([place]) [phone] (office)

## 2021-03-08 NOTE — Progress Notes (Signed)
Physical Therapy Treatment Patient Details Name: Michaela Baker MRN: [id number] DOB: 1950-10-16 Today's Date: 03/08/2021   History of Present Illness 69 y.o. female presented to the ED after fall at home and remained on floor for 3 hrs unable to get up, admitted for cellulitis and acute kidney injury. PMH significant for type II DM, hypothyroidism, HTN, and unspecified CHF.    PT Comments    Pt in bed on 2 lts sats 96%.  Trial removed oxygen.  RA avg 92%.  Assisted OOB to recliner. General bed mobility comments: MAX assist using bed pad to complete swival/scoot to EOB pt offered < 25% assist due to R UE edema with decreased functional use and R LE edema "heavy".  Once seated upright pt sat EOB x 4 min ar Supervision level. General transfer comment: attempted sit to stand with walker however pt was uinable to clear hips off bed. Pt unable to functionally use R UE edema and was too weak to support her weight.  So used STEDY to assist off elevated bed to transfer to recliner.  Pt was able to static stand in stedy for 75 seconds with VC's for upright posture. Pt will need Rehab prior to returning home.  Recommendations for follow up therapy are one component of a multi-disciplinary discharge planning process, led by the attending physician.  Recommendations may be updated based on patient status, additional functional criteria and insurance authorization.  Follow Up Recommendations  CIR     Equipment Recommendations  Other (comment) (TBD)    Recommendations for Other Services       Precautions / Restrictions Precautions Precautions: Fall Precaution Comments: Has R heel wound, R LE edema, R UE edema     Mobility  Bed Mobility Overal bed mobility: Needs Assistance Bed Mobility: Supine to Sit     Supine to sit: Max assist     General bed mobility comments: MAX assist using bed pad to complete swival/scoot to EOB pt offered < 25% assist due to R UE edema with decreased functional use  and R LE edema "heavy".  Once seated upright pt sat EOB x 4 min ar Supervision level.    Transfers Overall transfer level: Needs assistance   Transfers: Sit to/from Stand Sit to Stand: Max assist;+2 physical assistance;+2 safety/equipment         General transfer comment: attempted sit to stand with walker however pt was uinable to clear hips off bed. Pt unable to functionally use R UE edema and was too weak to support her weight.  So used STEDY to assist off elevated bed to transfer to recliner.  Pt was able to static stand in stedy for 75 seconds with VC's for upright posture.  Ambulation/Gait             General Gait Details: unable at this time Inability to stand and support her weight due to R LE weakness,edema and pain.    Stairs             Wheelchair Mobility    Modified Rankin (Stroke Patients Only)       Balance                                            Cognition Arousal/Alertness: Awake/alert Behavior During Therapy: WFL for tasks assessed/performed Overall Cognitive Status: Within Functional Limits for tasks assessed  General Comments: AxO x 3 required increased time, fear of falling      Exercises      General Comments        Pertinent Vitals/Pain Pain Assessment: Faces Faces Pain Scale: Hurts even more Pain Location: R leg, R hand Pain Descriptors / Indicators: Discomfort;Dull;Constant Pain Intervention(s): Monitored during session;Repositioned    Home Living                      Prior Function            PT Goals (current goals can now be found in the care plan section) Progress towards PT goals: Progressing toward goals    Frequency    Min 3X/week      PT Plan Current plan remains appropriate    Co-evaluation              AM-PAC PT "6 Clicks" Mobility   Outcome Measure  Help needed turning from your back to your side while in a flat  bed without using bedrails?: A Lot Help needed moving from lying on your back to sitting on the side of a flat bed without using bedrails?: A Lot Help needed moving to and from a bed to a chair (including a wheelchair)?: A Lot Help needed standing up from a chair using your arms (e.g., wheelchair or bedside chair)?: A Lot Help needed to walk in hospital room?: Total Help needed climbing 3-5 steps with a railing? : Total 6 Click Score: 10    End of Session Equipment Utilized During Treatment: Gait belt Activity Tolerance: Patient tolerated treatment well Patient left: in chair;with call bell/phone within reach;with chair alarm set Nurse Communication: Mobility status;Other (comment) (STEDY) PT Visit Diagnosis: Unsteadiness on feet (R26.81);Muscle weakness (generalized) (M62.81);Difficulty in walking, not elsewhere classified (R26.2)     Time: 1504-1364 PT Time Calculation (min) (ACUTE ONLY): 34 min  Charges:  $Therapeutic Activity: 23-37 mins                     Rica Koyanagi  PTA Acute  Rehabilitation Services Pager      [phone] Office      [phone]

## 2021-03-08 NOTE — Progress Notes (Signed)
Pt has an irregular HR into upper 30-40 range then 70-80s, pt is not on telemetry. Pt is asleep and awakens easily, denies concerns, immediately falls back to sleep. AMION text page sent to Jeannette Corpus NP

## 2021-03-08 NOTE — Care Management Important Message (Signed)
Important Message  Patient Details IM Letter given to the Patient. Name: Michaela Baker MRN: [id number] Date of Birth: 1950-06-07   Medicare Important Message Given:  Yes     [name] 03/08/2021, 11:20 AM

## 2021-03-08 NOTE — Progress Notes (Signed)
Occupational Therapy Treatment Patient Details Name: Michaela Baker MRN: [id number] DOB: February 04, 1951 Today's Date: 03/08/2021   History of present illness 70 y.o. female presented to the ED after fall at home and remained on floor for 3 hrs unable to get up, admitted for cellulitis and acute kidney injury. PMH significant for type II DM, hypothyroidism, HTN, and unspecified CHF.   OT comments  Patient was educated on hand exercises and positioning to reduce edema in LUE. Patient and family in room verbalized and demonstrated understanding. Patient was able to participate in weight shifting and dynamic sitting balance challenges to increase ability to participate in ADL tasks. Patient remains +2 to stand and for transfers at this time. Patient would benefit from inpatient rehab services at time of d/c to increase independence and safety with ADLs.Patient's discharge plan remains appropriate at this time. OT will continue to follow acutely.     Recommendations for follow up therapy are one component of a multi-disciplinary discharge planning process, led by the attending physician.  Recommendations may be updated based on patient status, additional functional criteria and insurance authorization.    Follow Up Recommendations  CIR    Equipment Recommendations  Other (comment) (defer to next venue)    Recommendations for Other Services      Precautions / Restrictions Precautions Precautions: Fall Precaution Comments: Has R heel wound, R LE edema, R UE edema Restrictions Weight Bearing Restrictions: No       Mobility Bed Mobility Overal bed mobility: Needs Assistance Bed Mobility: Supine to Sit     Supine to sit: Max assist     General bed mobility comments: MAX assist using bed pad to complete swival/scoot to EOB pt offered < 25% assist due to R UE edema with decreased functional use and R LE edema "heavy".  Once seated upright pt sat EOB x 4 min ar Supervision level.     Transfers Overall transfer level: Needs assistance   Transfers: Sit to/from Stand Sit to Stand: Max assist;+2 physical assistance;+2 safety/equipment         General transfer comment: attempted sit to stand with walker however pt was uinable to clear hips off bed. Pt unable to functionally use R UE edema and was too weak to support her weight.  So used STEDY to assist off elevated bed to transfer to recliner.  Pt was able to static stand in stedy for 75 seconds with VC's for upright posture.    Balance                                           ADL either performed or assessed with clinical judgement   ADL                                               Vision       Perception     Praxis      Cognition Arousal/Alertness: Awake/alert Behavior During Therapy: WFL for tasks assessed/performed Overall Cognitive Status: Within Functional Limits for tasks assessed                                 General Comments: AxO x 3 required increased time, fear  of falling        Exercises Other Exercises Other Exercises: patient participated in hand squeezes BUE 5 reps x2. patient was educated on completing task x3 each day. patient verbalized understanding.  patients son and daughter were present in room during session. Other Exercises: patient participated in weight shifting and pre standing tasks with recliner push ups with patient needing increased assistance with task. patient was encouraged to participate in continued time out of bed to increase strenght. patient verbalized understanding.   Shoulder Instructions       General Comments      Pertinent Vitals/ Pain       Pain Assessment: Faces Faces Pain Scale: Hurts even more Pain Location: R leg, R hand Pain Descriptors / Indicators: Discomfort;Dull;Constant Pain Intervention(s): Monitored during session;Repositioned  Home Living                                           Prior Functioning/Environment              Frequency  Min 2X/week        Progress Toward Goals  OT Goals(current goals can now be found in the care plan section)  Progress towards OT goals: Progressing toward goals  Acute Rehab OT Goals Patient Stated Goal: To get stronger  Plan Discharge plan remains appropriate    Co-evaluation                 AM-PAC OT "6 Clicks" Daily Activity     Outcome Measure   Help from another person eating meals?: A Little Help from another person taking care of personal grooming?: A Little Help from another person toileting, which includes using toliet, bedpan, or urinal?: A Lot Help from another person bathing (including washing, rinsing, drying)?: A Lot Help from another person to put on and taking off regular upper body clothing?: A Little Help from another person to put on and taking off regular lower body clothing?: A Lot 6 Click Score: 15    End of Session    OT Visit Diagnosis: Unsteadiness on feet (R26.81);Other abnormalities of gait and mobility (R26.89);History of falling (Z91.81);Muscle weakness (generalized) (M62.81);Pain   Activity Tolerance Patient tolerated treatment well   Patient Left in chair;with call bell/phone within reach;with chair alarm set   Nurse Communication          Time: [phone] OT Time Calculation (min): 23 min  Charges: OT General Charges $OT Visit: 1 Visit OT Treatments $Therapeutic Activity: 23-37 mins  Jackelyn Poling OTR/L, [facility] Acute Rehabilitation Department Office# [phone] Pager# [phone]   [place] 03/08/2021, 3:52 PM

## 2021-03-08 NOTE — Progress Notes (Signed)
PROGRESS NOTE    JEANICE DEMPSEY  YIR:[id number] DOB: 19-May-1951 DOA: 03/03/2021 PCP: [name], FNP    Brief Narrative:  Ursala Cressy was admitted due to Right Lower Extremity Cellulitis with sepsis  Jendayi Berling is a 70 year old female, hx of Chf, htn, obesity, pvd, dm well controlled, hypothyroidism, osa. She had fallen at home and was found by family after a short time, but still several hours. She was found to have an AKI as well as a chronic right foot wound that has been untreated for many months. The right leg was noted to be edematous, erythematous, and painful to palpation, no dvt noted with LE [place].   Initial labs   Sodium 133, potassium 4.6, chloride 97, bicarb 23, glucose 88, BUN 38, creatinine 1.81, AST 57, ALT 22, white count 14.6, hemoglobin 12.4, hematocrit 40.4, platelets 165.  10/6 she was noted to be septic, BC without growth, Vanc resumed, UA with copious bacteria and some WBC, covered with present Abx.   Currently she has improved, afebrile, resolved AKI, cellulitis improving, wbc improving, but has developed some mild confusion.   Labs as of 03/07/21.  Wbc 11.3 down from 26.5 on 10/6. Na 141 compared with 133 10/5. K 3.6. Chloride 111. Co2 20. Bun 27. Creatinine 0.99 vs 1.81 on 03/03/21. Gfr improved to greater than 60 on 03/07/21. Initial gfr 30 on 10/5.    Assessment & Plan:   Principal Problem:   AKI (acute kidney injury) ([place]) Active Problems:   HTN (hypertension)   OSA (obstructive sleep apnea)   Hypothyroidism   Class II obesity   Hyponatremia   Abnormal LFTs   Cellulitis of right lower extremity   Sepsis ([place])   1: AKI -resolved Current crt 0.99, k 3.6, bicarb 20.  Plan: IV fluids have ceased Resume home furosemide 40mg  qd Resume home klorcon 64meq qd  Continue holding Farxiga 10mg , recent bacteruria, completing Abx, revisit when abx have resolved and no concern of UTI, as UA was acquired post-sepsis and Abx.  2: Right Lower Extremity  Cellulitis and Wound  -transition to Augmentin and Doxycycline PO -Ceasing ceftriaxone and vancomycin -wbc trending down, last was 11.3 on 10/9.     3: HTN -continue metoprolol -continue amlodipine -continue holding lisinopril, resuming one antihypertensive at a time and re-assessing,  -currently resuming Lasix due to edema.  4: Hypothyroid -currently on levothyroxine   5: Acute Metabolic encephalopathy -likely related to recent illness and hopsitalization -currently has multivitamins and thiamine -continuing to re-assess.     Patient continue to be at high risk for sepsis, kidney injury, falls  Status is: Inpatient  Remains inpatient appropriate because:Ongoing diagnostic testing needed not appropriate for outpatient work up and Inpatient level of care appropriate due to severity of illness  Dispo: The patient is from: Home              Anticipated d/c is to: CIR              Patient currently is not medically stable to d/c.   Difficult to place patient No       DVT prophylaxis: lovenox  Code Status:   Full Family Communication:  Son at bedside    Nutrition Status: Nutrition Problem: Increased nutrient needs Etiology: wound healing Signs/Symptoms: estimated needs Interventions: Liberalize Diet, Other (Comment), MVI (Double protein)     Skin Documentation:  Right leg continues to be edematous and tender, hands have become edematous as well     Antimicrobials:  Ceftriaxone 10/5-current Flagly 10/5-10/6 Vancomycin 10/5, restarted 10/6 - current   Subjective: Patient is seated in a chair, conversational, reports hand tightness due to edema, reports right leg pain with palpation.   Objective: Vitals:   03/07/21 2147 03/08/21 0206 03/08/21 0234 03/08/21 1015  BP: 134/66 (!) 113/47 119/64 126/65  Pulse: 79 68  73  Resp: 20 20    Temp:  98.6 F (37 C)    TempSrc:  Oral    SpO2: 98% 100%  98%  Weight:      Height:        Intake/Output  Summary (Last 24 hours) at 03/08/2021 1412 Last data filed at 03/08/2021 0900 Gross per 24 hour  Intake 240 ml  Output 500 ml  Net -260 ml   Filed Weights   03/03/21 1100 03/03/21 1726  Weight: 94.3 kg 81.5 kg    Examination:   General: Not in pain or dyspnea  Neurology: Awake and alert, non focal  E ENT: no pallor, no icterus, oral mucosa moist Cardiovascular: No JVD. S1-S2 present, rhythmic, no gallops, rubs, or murmurs. Right lower leg remains edematous, warm, erythematous. Hands are now 2+ edema  Pulmonary: vesicular breath sounds bilaterally, adequate air movement, no wheezing, rhonchi or rales. Gastrointestinal. Abdomen flat, no organomegaly, non tender, no rebound or guarding Skin. No rashes, right heel wound Musculoskeletal: no joint deformities     Data Reviewed: I have personally reviewed following labs and imaging studies  CBC: Recent Labs  Lab 03/03/21 0253 03/04/21 0521 03/05/21 0505 03/06/21 1321 03/07/21 0525  WBC 14.6* 26.5* 18.5* 12.7* 11.3*  NEUTROABS 13.3*  --  16.8* 10.5*  --   HGB 12.4 10.7* 11.5* 12.4 11.7*  HCT 40.4 34.6* 36.6 39.2 37.2  MCV 83.0 82.0 81.2 80.7 79.7*  PLT 165 159 183 291 546   Basic Metabolic Panel: Recent Labs  Lab 03/03/21 0253 03/04/21 0521 03/05/21 0505 03/06/21 1321 03/07/21 0525  NA 133* 134* 133* 135 141  K 4.6 3.9 3.5 3.6 3.6  CL 97* 103 104 106 111  CO2 23 25 21* 21* 20*  GLUCOSE 88 83 113* 108* 91  BUN 38* 31* 30* 33* 27*  CREATININE 1.81* 1.30* 1.22* 1.12* 0.99  CALCIUM 8.9 8.3* 8.4* 8.4* 8.8*   GFR: Estimated Creatinine Clearance: 55 mL/min (by C-G formula based on SCr of 0.99 mg/dL). Liver Function Tests: Recent Labs  Lab 03/03/21 0253 03/04/21 0521  AST 57* 45*  ALT 22 20  ALKPHOS 39 38  BILITOT 1.6* 1.0  PROT 9.1* 7.4  ALBUMIN 2.8* 2.1*   No results for input(s): LIPASE, AMYLASE in the last 168 hours. No results for input(s): AMMONIA in the last 168 hours. Coagulation Profile: Recent  Labs  Lab 03/04/21 0521  INR 1.5*   Cardiac Enzymes: Recent Labs  Lab 03/03/21 0253 03/04/21 0521 03/05/21 0505  CKTOTAL 334* 332* 136   BNP (last 3 results) No results for input(s): PROBNP in the last 8760 hours. HbA1C: No results for input(s): HGBA1C in the last 72 hours. CBG: Recent Labs  Lab 03/07/21 0800 03/07/21 1148 03/07/21 1705 03/08/21 0735 03/08/21 1148  GLUCAP 84 84 98 86 103*   Lipid Profile: No results for input(s): CHOL, HDL, LDLCALC, TRIG, CHOLHDL, LDLDIRECT in the last 72 hours. Thyroid Function Tests: No results for input(s): TSH, T4TOTAL, FREET4, T3FREE, THYROIDAB in the last 72 hours. Anemia Panel: No results for input(s): VITAMINB12, FOLATE, FERRITIN, TIBC, IRON, RETICCTPCT in the last 72 hours.    Radiology Studies:  I have reviewed all of the imaging during this hospital visit personally     Scheduled Meds:  amLODipine  10 mg Oral Daily   aspirin EC  81 mg Oral Daily   enoxaparin (LOVENOX) injection  40 mg Subcutaneous Q24H   levothyroxine  25 mcg Oral Daily   metoprolol succinate  50 mg Oral Daily   multivitamin with minerals  1 tablet Oral Daily   mupirocin ointment   Topical BID   thiamine  100 mg Oral Daily   Continuous Infusions:  cefTRIAXone (ROCEPHIN)  IV 2 g (03/08/21 0522)   vancomycin 1,000 mg (03/07/21 1738)     LOS: 4 days        Ellie Lunch, student

## 2021-03-08 NOTE — Progress Notes (Signed)
EKG done and placed pt on tele box 65

## 2021-03-09 DIAGNOSIS — N179 Acute kidney failure, unspecified: Secondary | ICD-10-CM | POA: Diagnosis not present

## 2021-03-09 DIAGNOSIS — L03115 Cellulitis of right lower limb: Secondary | ICD-10-CM | POA: Diagnosis not present

## 2021-03-09 DIAGNOSIS — I10 Essential (primary) hypertension: Secondary | ICD-10-CM | POA: Diagnosis not present

## 2021-03-09 DIAGNOSIS — R5381 Other malaise: Secondary | ICD-10-CM

## 2021-03-09 DIAGNOSIS — E039 Hypothyroidism, unspecified: Secondary | ICD-10-CM | POA: Diagnosis not present

## 2021-03-09 LAB — CULTURE, BLOOD (SINGLE)
Culture: NO GROWTH
Special Requests: ADEQUATE

## 2021-03-09 LAB — CBC
HCT: 33.6 % — ABNORMAL LOW (ref 36.0–46.0)
Hemoglobin: 10.2 g/dL — ABNORMAL LOW (ref 12.0–15.0)
MCH: 25.3 pg — ABNORMAL LOW (ref 26.0–34.0)
MCHC: 30.4 g/dL (ref 30.0–36.0)
MCV: 83.4 fL (ref 80.0–100.0)
Platelets: 295 10*3/uL (ref 150–400)
RBC: 4.03 MIL/uL (ref 3.87–5.11)
RDW: 16.7 % — ABNORMAL HIGH (ref 11.5–15.5)
WBC: 10.7 10*3/uL — ABNORMAL HIGH (ref 4.0–10.5)
nRBC: 0 % (ref 0.0–0.2)

## 2021-03-09 LAB — GLUCOSE, CAPILLARY
Glucose-Capillary: 94 mg/dL (ref 70–99)
Glucose-Capillary: 100 mg/dL — ABNORMAL HIGH (ref 70–99)
Glucose-Capillary: 98 mg/dL (ref 70–99)
Glucose-Capillary: 108 mg/dL — ABNORMAL HIGH (ref 70–99)

## 2021-03-09 LAB — BASIC METABOLIC PANEL
Anion gap: 7 (ref 5–15)
BUN: 23 mg/dL (ref 8–23)
CO2: 23 mmol/L (ref 22–32)
Calcium: 8.2 mg/dL — ABNORMAL LOW (ref 8.9–10.3)
Chloride: 108 mmol/L (ref 98–111)
Creatinine, Ser: 0.88 mg/dL (ref 0.44–1.00)
GFR, Estimated: >60 mL/min (ref >60)
Glucose, Bld: 92 mg/dL (ref 70–99)
Potassium: 3.5 mmol/L (ref 3.5–5.1)
Sodium: 138 mmol/L (ref 135–145)

## 2021-03-09 LAB — MAGNESIUM: Magnesium: 1.8 mg/dL (ref 1.7–2.4)

## 2021-03-09 MED ORDER — ADULT MULTIVITAMIN W/MINERALS CH
1.0000 | ORAL_TABLET | Freq: Every day | ORAL | Status: DC
  Administered 2021-03-10 – 2021-03-15 (×6): 1 via ORAL
  Filled 2021-03-09 (×6): qty 1

## 2021-03-09 MED ORDER — AMOXICILLIN-POT CLAVULANATE 875-125 MG PO TABS
1.0000 | ORAL_TABLET | Freq: Two times a day (BID) | ORAL | Status: AC
  Administered 2021-03-10 – 2021-03-12 (×6): 1 via ORAL
  Filled 2021-03-09 (×7): qty 1

## 2021-03-09 MED ORDER — AMLODIPINE BESYLATE 5 MG PO TABS
5.0000 mg | ORAL_TABLET | Freq: Every day | ORAL | Status: DC
  Administered 2021-03-10 – 2021-03-16 (×7): 5 mg via ORAL
  Filled 2021-03-09 (×8): qty 1

## 2021-03-09 MED ORDER — METOPROLOL SUCCINATE ER 25 MG PO TB24
12.5000 mg | ORAL_TABLET | Freq: Every day | ORAL | Status: DC
  Administered 2021-03-10 – 2021-03-16 (×7): 12.5 mg via ORAL
  Filled 2021-03-09 (×7): qty 1

## 2021-03-09 MED ORDER — DOXYCYCLINE HYCLATE 100 MG PO TABS
100.0000 mg | ORAL_TABLET | Freq: Two times a day (BID) | ORAL | Status: AC
  Administered 2021-03-09 – 2021-03-12 (×8): 100 mg via ORAL
  Filled 2021-03-09 (×8): qty 1

## 2021-03-09 NOTE — Progress Notes (Signed)
PROGRESS NOTE    Michaela Baker  VQM:[id number] DOB: [birth year] DOA: 03/03/2021 PCP: [name], FNP   Chief Complaint  Patient presents with   Fall    Brief Narrative:  As per H&P written by Dr. Si Gaul 03/03/2021 Michaela Baker is a 70 y.o. female with medical history significant of unspecified CHF, hypertension, class II obesity, history of PVCs, type II DM, hypothyroidism, OSA who is coming to the emergency department due to having a fall at home and staying on the floor for about 3 hours unable to stand.  She stated that she has felt weak and unwell for the last few days.  Not sure if she has had a fever or any other symptoms.  Her granddaughter stated that her right foot wound has been there for over a year and she has been having wound care with a provider on a regular basis but did not know further details.  The patient is able to answer simple questions, deny headache, back, chest or abdominal pain at this time.  She stated that her foot discomfort is currently under control.   ED Course: Initial vital signs were temperature 98.8 F, pulse 102, respiration 18, BP 112/59 mmHg O2 sat 94% on room air.  Patient received 1000 mL of NS bolus, 2 g of ceftriaxone and started on vancomycin per pharmacy.   Lab work: Her CBC showed a white count of 14.6 with 91% neutrophils, 6% lymphocytes and 2% monocytes.  Sodium 133, chloride 97 mmol/L.  The rest of the electrolytes were normal.  BUN was 38, creatinine 1.81 and total bilirubin 1.6 mg/dL.  Total protein was 9.1 and albumin 2.8 g/dL.  AST was 57 units/L.  Total CK was 334 units/L.  Imaging: 2 view chest radiograph was any abnormalities.  Right hip x-ray with no acute fracture or dislocation.  Right foot x-ray did not show any fracture or dislocation of the right foot.  There was a calcified peripheral vascular disease.  CT head without contrast showed mild posterior convexity of scalp with soft tissue injury without underlying skull fracture.   Normal brain appearance for age.  Assessment & Plan: 1-Acute kidney injury/hyponatremia -In the setting of prerenal azotemia and dehydration -Continue to minimize the use of nephrotoxic agents -Avoid the use of contrast and avoid hypotension. -Continue to maintain adequate hydration -Renal function back to normal after intervention provided. -Continue to follow trend intermittently.  2-right lower extremity cellulitis complicated with sepsis -WBCs back to normal -Patient is afebrile -Ultrasound demonstrating no DVT -Swelling, erythematous changes and pain improved -At this moment patient is on antibiotic therapy will be transition to oral Augmentin and doxycycline and will discontinue ceftriaxone and vancomycin. -Continue to follow clinical response-  3-hypertension -Will continue low-dose metoprolol and amlodipine -Follow vital signs. -Patient heart rate at times has been slow.  4-hypothyroidism -Continue Synthroid  5-acute metabolic encephalopathy -Acute presentation likely related to acute illness and hospitalization -We will continue to closely follow neurochecks -No signs of urinary tract infection appreciated currently -Continue dysphagia 3 diet with aspiration precautions -Patient mentation continued to improve, family expressing that is not completely back to baseline, but very close.  6-physical deconditioning/weakness -Patient has been seen by physical therapy and Occupational Therapy who feel patient is a good candidate for CIR oriented to maximize/improved her safe return home at time of discharge.    Principal Problem:   AKI (acute kidney injury) ([place]) Active Problems:   HTN (hypertension)   OSA (obstructive sleep apnea)  Hypothyroidism   Class II obesity   Hyponatremia   Abnormal LFTs   Cellulitis of right lower extremity   Sepsis ([place])   DVT prophylaxis: Lovenox Code Status: Full code Family Communication: No family at bedside. Disposition:    Status is: Inpatient  Remains inpatient appropriate because:IV treatments appropriate due to intensity of illness or inability to take PO  Dispo: The patient is from: Home              Anticipated d/c is to: CIR              Patient currently is medically stable to d/c.   Difficult to place patient No       Consultants:  None   Procedures:  See below for x-ray report.  Antimicrobials:  Ceftriaxone and vancomycin (discontinuation date 03/09/2021) -Augmentin/doxycycline starting on 03/09/2021 and planning for a 5 days course.   Subjective: Chronically ill and deconditioned in appearance; currently afebrile, no chest pain, no nausea, no vomiting.  Tolerating diet without problems.  Expressing improvement in right lower extremity pain, swelling and discomfort  Objective: Vitals:   03/08/21 2049 03/09/21 0055 03/09/21 0439 03/09/21 1243  BP: 117/62  136/71 (!) 119/57  Pulse: 77  77 77  Resp: 20  20 20   Temp: 99.1 F (37.3 C) 98.6 F (37 C) 99.5 F (37.5 C) (!) 97.5 F (36.4 C)  TempSrc: Oral Oral Oral Oral  SpO2: 100%  97% 96%  Weight:      Height:        Intake/Output Summary (Last 24 hours) at 03/09/2021 1736 Last data filed at 03/09/2021 0959 Gross per 24 hour  Intake 240 ml  Output 600 ml  Net -360 ml   Filed Weights   03/03/21 1100 03/03/21 1726  Weight: 94.3 kg 81.5 kg    Examination: General exam: Appears calm and comfortable; 2 L nasal cannula supplementation in place.  Reports no nausea, no vomiting, improved oral intake.  Patient is afebrile. Respiratory system: Clear to auscultation. Respiratory effort normal.  No using accessory muscle.  2 L nasal cannula in place. Cardiovascular system: S1 & S2 heard, RRR. No JVD, murmurs, rubs, gallops or clicks.  Gastrointestinal system: Abdomen is nondistended, soft and nontender. No organomegaly or masses felt. Normal bowel sounds heard. Central nervous system: Alert and oriented. No focal neurological  deficits. Extremities: No cyanosis or clubbing, trace edema appreciated in his legs; erythematous changes and mild pain reported in her right lower extremity (overall improved from in comparison to documentation at time of admission). Skin: No petechiae; no open wounds currently. Psychiatry: Judgement and insight appear normal. Mood & affect appropriate.     Data Reviewed: I have personally reviewed following labs and imaging studies  CBC: Recent Labs  Lab 03/03/21 0253 03/04/21 0521 03/05/21 0505 03/06/21 1321 03/07/21 0525 03/09/21 0449  WBC 14.6* 26.5* 18.5* 12.7* 11.3* 10.7*  NEUTROABS 13.3*  --  16.8* 10.5*  --   --   HGB 12.4 10.7* 11.5* 12.4 11.7* 10.2*  HCT 40.4 34.6* 36.6 39.2 37.2 33.6*  MCV 83.0 82.0 81.2 80.7 79.7* 83.4  PLT 165 159 183 291 316 154    Basic Metabolic Panel: Recent Labs  Lab 03/04/21 0521 03/05/21 0505 03/06/21 1321 03/07/21 0525 03/09/21 0449  NA 134* 133* 135 141 138  K 3.9 3.5 3.6 3.6 3.5  CL 103 104 106 111 108  CO2 25 21* 21* 20* 23  GLUCOSE 83 113* 108* 91 92  BUN 31* 30*  33* 27* 23  CREATININE 1.30* 1.22* 1.12* 0.99 0.88  CALCIUM 8.3* 8.4* 8.4* 8.8* 8.2*  MG  --   --   --   --  1.8    GFR: Estimated Creatinine Clearance: 61.8 mL/min (by C-G formula based on SCr of 0.88 mg/dL).  Liver Function Tests: Recent Labs  Lab 03/03/21 0253 03/04/21 0521  AST 57* 45*  ALT 22 20  ALKPHOS 39 38  BILITOT 1.6* 1.0  PROT 9.1* 7.4  ALBUMIN 2.8* 2.1*    CBG: Recent Labs  Lab 03/08/21 1644 03/08/21 2122 03/09/21 0739 03/09/21 1208 03/09/21 1611  GLUCAP 116* 86 94 100* 98     Recent Results (from the past 240 hour(s))  Resp Panel by RT-PCR (Flu A&B, Covid) Nasopharyngeal Swab     Status: None   Collection Time: 03/03/21  2:23 PM   Specimen: Nasopharyngeal Swab; Nasopharyngeal(NP) swabs in vial transport medium  Result Value Ref Range Status   SARS Coronavirus 2 by RT PCR NEGATIVE NEGATIVE Final    Comment:  (NOTE) SARS-CoV-2 target nucleic acids are NOT DETECTED.  The SARS-CoV-2 RNA is generally detectable in upper respiratory specimens during the acute phase of infection. The lowest concentration of SARS-CoV-2 viral copies this assay can detect is 138 copies/mL. A negative result does not preclude SARS-Cov-2 infection and should not be used as the sole basis for treatment or other patient management decisions. A negative result may occur with  improper specimen collection/handling, submission of specimen other than nasopharyngeal swab, presence of viral mutation(s) within the areas targeted by this assay, and inadequate number of viral copies(<138 copies/mL). A negative result must be combined with clinical observations, patient history, and epidemiological information. The expected result is Negative.  Fact Sheet for Patients:  EntrepreneurPulse.com.au  Fact Sheet for Healthcare Providers:  IncredibleEmployment.be  This test is no t yet approved or cleared by the [place] FDA and  has been authorized for detection and/or diagnosis of SARS-CoV-2 by FDA under an Emergency Use Authorization (EUA). This EUA will remain  in effect (meaning this test can be used) for the duration of the COVID-19 declaration under Section 564(b)(1) of the Act, 21 U.S.C.section 360bbb-3(b)(1), unless the authorization is terminated  or revoked sooner.       Influenza A by PCR NEGATIVE NEGATIVE Final   Influenza B by PCR NEGATIVE NEGATIVE Final    Comment: (NOTE) The Xpert Xpress SARS-CoV-2/FLU/RSV plus assay is intended as an aid in the diagnosis of influenza from Nasopharyngeal swab specimens and should not be used as a sole basis for treatment. Nasal washings and aspirates are unacceptable for Xpert Xpress SARS-CoV-2/FLU/RSV testing.  Fact Sheet for Patients: EntrepreneurPulse.com.au  Fact Sheet for Healthcare  Providers: IncredibleEmployment.be  This test is not yet approved or cleared by the [place] FDA and has been authorized for detection and/or diagnosis of SARS-CoV-2 by FDA under an Emergency Use Authorization (EUA). This EUA will remain in effect (meaning this test can be used) for the duration of the COVID-19 declaration under Section 564(b)(1) of the Act, 21 U.S.C. section 360bbb-3(b)(1), unless the authorization is terminated or revoked.  Performed at [facility], [place] [address] [place], [place] [address]   Blood culture (routine single)     Status: None   Collection Time: 03/04/21  4:29 PM   Specimen: BLOOD  Result Value Ref Range Status   Specimen Description   Final    BLOOD RIGHT ANTECUBITAL Performed at [facility]  [address] [place], [place] [address]    Special Requests   Final    BOTTLES DRAWN AEROBIC AND ANAEROBIC Blood Culture adequate volume Performed at [facility] [address], [place], [place] [address]    Culture   Final    NO GROWTH 5 DAYS Performed at [facility], [place] [address], [place], [place] [address]    Report Status 03/09/2021 FINAL  Final     Radiology Studies: No results found.   Scheduled Meds:  amLODipine  10 mg Oral Daily   [START ON 03/10/2021] amoxicillin-clavulanate  1 tablet Oral Q12H   aspirin EC  81 mg Oral Daily   doxycycline  100 mg Oral Q12H   enoxaparin (LOVENOX) injection  40 mg Subcutaneous Q24H   furosemide  40 mg Oral Daily   levothyroxine  25 mcg Oral Daily   metoprolol succinate  50 mg Oral Daily   [START ON 03/10/2021] multivitamin with minerals  1 tablet Oral Q lunch   mupirocin ointment   Topical BID   thiamine  100 mg Oral Daily   Continuous Infusions:   LOS: 5 days    Time spent: 30 minutes   Barton Dubois, MD [facility]   To contact the attending provider between 7A-7P or the covering provider  during after hours 7P-7A, please log into the web site [email or web address] and access using universal [place] password for that web site. If you do not have the password, please call the hospital operator.  03/09/2021, 5:36 PM

## 2021-03-09 NOTE — Progress Notes (Signed)
Inpatient Rehab Admissions Coordinator:   I do not have a CIR bed for this Pt. Today and have not received insurance auth. I will pursue for admission pending bed availability and insurance auth.   Clemens Catholic, [place], [place] Admissions Coordinator  [phone] ([place]) [phone] (office)

## 2021-03-10 DIAGNOSIS — E871 Hypo-osmolality and hyponatremia: Secondary | ICD-10-CM | POA: Diagnosis not present

## 2021-03-10 DIAGNOSIS — E039 Hypothyroidism, unspecified: Secondary | ICD-10-CM | POA: Diagnosis not present

## 2021-03-10 DIAGNOSIS — N179 Acute kidney failure, unspecified: Secondary | ICD-10-CM | POA: Diagnosis not present

## 2021-03-10 DIAGNOSIS — L03115 Cellulitis of right lower limb: Secondary | ICD-10-CM | POA: Diagnosis not present

## 2021-03-10 LAB — GLUCOSE, CAPILLARY
Glucose-Capillary: 73 mg/dL (ref 70–99)
Glucose-Capillary: 104 mg/dL — ABNORMAL HIGH (ref 70–99)
Glucose-Capillary: 112 mg/dL — ABNORMAL HIGH (ref 70–99)
Glucose-Capillary: 102 mg/dL — ABNORMAL HIGH (ref 70–99)

## 2021-03-10 LAB — CBC
HCT: 33.9 % — ABNORMAL LOW (ref 36.0–46.0)
Hemoglobin: 10 g/dL — ABNORMAL LOW (ref 12.0–15.0)
MCH: 25.7 pg — ABNORMAL LOW (ref 26.0–34.0)
MCHC: 29.5 g/dL — ABNORMAL LOW (ref 30.0–36.0)
MCV: 87.1 fL (ref 80.0–100.0)
Platelets: 322 10*3/uL (ref 150–400)
RBC: 3.89 MIL/uL (ref 3.87–5.11)
RDW: 16.7 % — ABNORMAL HIGH (ref 11.5–15.5)
WBC: 11.3 10*3/uL — ABNORMAL HIGH (ref 4.0–10.5)
nRBC: 0 % (ref 0.0–0.2)

## 2021-03-10 LAB — CREATININE, SERUM
Creatinine, Ser: 0.79 mg/dL (ref 0.44–1.00)
GFR, Estimated: >60 mL/min (ref >60)

## 2021-03-10 NOTE — Progress Notes (Signed)
PROGRESS NOTE    Michaela Baker  OAC:[id number] DOB: 1951/02/26 DOA: 03/03/2021 PCP: [name], FNP   Brief Narrative: Michaela Baker is a 70 y.o. female with a history of unspecified CHF, hypertension, class II obesity, history of PVCs, type II DM, hypothyroidism, OSA. Patient presented after a fall with worsening right foot pain. She was found to have a right foot cellulitis and started on IV antibiotics..   Assessment & Plan:   Principal Problem:   AKI (acute kidney injury) ([place]) Active Problems:   HTN (hypertension)   OSA (obstructive sleep apnea)   Hypothyroidism   Class II obesity   Hyponatremia   Abnormal LFTs   Cellulitis of right lower extremity   Sepsis ([place])   AKI Baseline creatinine of about 0.9-1 from 4 years prior. Creatinine of 1.81 on admission. Secondary to dehydration and resultant prerenal azotemia. Patient started on IV fluids with improvement of creatinine and resolution of AKI  Right LE cellulitis LE venous duplex without evidence of VTE. Patient empirically started on Vancomycin/Ceftriaxone/Flagyl; Flagyl discontinued on HD #3. Transitioned to doxycycline and Augmentin after continued clinical improvement on IV antibiotics. Leukocytosis improved. -Continue Augmentin/doxycycline  Sepsis Not present on admission. Secondary to cellulitis. Blood cultures with no growth. Antibiotics as mentioned above.  Hyponatremia Resolved with hydration.  Acute metabolic encephalopathy Likely related to acute illness. Mostly resolved.  Hypothyroidism -Continue Synthroid 25 mcg daily  Primary hypertension -Continue Toprol XL and amlodipine   DVT prophylaxis: Lovenox Code Status:   Code Status: Full Code Family Communication: None at bedside Disposition Plan: Discharge to SNF when bed is available   Consultants:  None  Procedures:  LE VENOUS DUPLEX (10/5) BILATERAL ABI (10/5) Summary:  Right: Resting right ankle-brachial index indicates  noncompressible right  lower extremity arteries. The right toe-brachial index is normal.   TBI shows borderline small vessel disease.  Left: Resting left ankle-brachial index indicates noncompressible left  lower extremity arteries. The left toe-brachial index is normal.   Antimicrobials: Vancomycin IV Ceftriaxone IV Flagyl IV Augmentin PO Doxycycline PO    Subjective: Still with some right leg pain. No other concerns.  Objective: Vitals:   03/09/21 2024 03/10/21 0628 03/10/21 1211 03/10/21 1212  BP: (!) 113/56 (!) 118/58 122/67 122/67  Pulse: 91 71 77 78  Resp: (!) 22 18  18   Temp: 98.9 F (37.2 C) 97.7 F (36.5 C)  98.3 F (36.8 C)  TempSrc: Oral Oral  Oral  SpO2: 100% 95%  95%  Weight:      Height:        Intake/Output Summary (Last 24 hours) at 03/10/2021 1612 Last data filed at 03/10/2021 0424 Gross per 24 hour  Intake 240 ml  Output 800 ml  Net -560 ml   Filed Weights   03/03/21 1100 03/03/21 1726  Weight: 94.3 kg 81.5 kg    Examination:  General exam: Appears calm and comfortable Respiratory system: Clear to auscultation. Respiratory effort normal. Cardiovascular system: S1 & S2 heard, RRR. No murmurs, rubs, gallops or clicks. Gastrointestinal system: Abdomen is nondistended, soft and nontender. No organomegaly or masses felt. Normal bowel sounds heard. Central nervous system: Alert and oriented. No focal neurological deficits. Musculoskeletal: RLE edema with associated tenderness on mild palpation; there is warmth of the right leg with no associated erythema. Skin: No cyanosis. No rashes Psychiatry: Judgement and insight appear normal. Mood & affect appropriate.     Data Reviewed: I have personally reviewed following labs and imaging studies  CBC  Lab Results  Component Value Date   WBC 10.7 (H) 03/09/2021   RBC 4.03 03/09/2021   HGB 10.2 (L) 03/09/2021   HCT 33.6 (L) 03/09/2021   MCV 83.4 03/09/2021   MCH 25.3 (L) 03/09/2021   PLT 295  03/09/2021   MCHC 30.4 03/09/2021   RDW 16.7 (H) 03/09/2021   LYMPHSABS 1.1 03/06/2021   MONOABS 0.8 03/06/2021   EOSABS 0.0 03/06/2021   BASOSABS 0.1 75/64/3329     Last metabolic panel Lab Results  Component Value Date   NA 138 03/09/2021   K 3.5 03/09/2021   CL 108 03/09/2021   CO2 23 03/09/2021   BUN 23 03/09/2021   CREATININE 0.79 03/10/2021   GLUCOSE 92 03/09/2021   GFRNONAA >60 03/10/2021   GFRAA >60 03/29/2017   CALCIUM 8.2 (L) 03/09/2021   PHOS 3.6 07/25/2013   PROT 7.4 03/04/2021   ALBUMIN 2.1 (L) 03/04/2021   BILITOT 1.0 03/04/2021   ALKPHOS 38 03/04/2021   AST 45 (H) 03/04/2021   ALT 20 03/04/2021   ANIONGAP 7 03/09/2021    CBG (last 3)  Recent Labs    03/09/21 2115 03/10/21 0806 03/10/21 1213  GLUCAP 108* 73 104*     GFR: Estimated Creatinine Clearance: 68 mL/min (by C-G formula based on SCr of 0.79 mg/dL).  Coagulation Profile: Recent Labs  Lab 03/04/21 0521  INR 1.5*    Recent Results (from the past 240 hour(s))  Resp Panel by RT-PCR (Flu A&B, Covid) Nasopharyngeal Swab     Status: None   Collection Time: 03/03/21  2:23 PM   Specimen: Nasopharyngeal Swab; Nasopharyngeal(NP) swabs in vial transport medium  Result Value Ref Range Status   SARS Coronavirus 2 by RT PCR NEGATIVE NEGATIVE Final    Comment: (NOTE) SARS-CoV-2 target nucleic acids are NOT DETECTED.  The SARS-CoV-2 RNA is generally detectable in upper respiratory specimens during the acute phase of infection. The lowest concentration of SARS-CoV-2 viral copies this assay can detect is 138 copies/mL. A negative result does not preclude SARS-Cov-2 infection and should not be used as the sole basis for treatment or other patient management decisions. A negative result may occur with  improper specimen collection/handling, submission of specimen other than nasopharyngeal swab, presence of viral mutation(s) within the areas targeted by this assay, and inadequate number of  viral copies(<138 copies/mL). A negative result must be combined with clinical observations, patient history, and epidemiological information. The expected result is Negative.  Fact Sheet for Patients:  EntrepreneurPulse.com.au  Fact Sheet for Healthcare Providers:  IncredibleEmployment.be  This test is no t yet approved or cleared by the [place] FDA and  has been authorized for detection and/or diagnosis of SARS-CoV-2 by FDA under an Emergency Use Authorization (EUA). This EUA will remain  in effect (meaning this test can be used) for the duration of the COVID-19 declaration under Section 564(b)(1) of the Act, 21 U.S.C.section 360bbb-3(b)(1), unless the authorization is terminated  or revoked sooner.       Influenza A by PCR NEGATIVE NEGATIVE Final   Influenza B by PCR NEGATIVE NEGATIVE Final    Comment: (NOTE) The Xpert Xpress SARS-CoV-2/FLU/RSV plus assay is intended as an aid in the diagnosis of influenza from Nasopharyngeal swab specimens and should not be used as a sole basis for treatment. Nasal washings and aspirates are unacceptable for Xpert Xpress SARS-CoV-2/FLU/RSV testing.  Fact Sheet for Patients: EntrepreneurPulse.com.au  Fact Sheet for Healthcare Providers: IncredibleEmployment.be  This test is not yet approved or cleared by  the [employer] and has been authorized for detection and/or diagnosis of SARS-CoV-2 by FDA under an Emergency Use Authorization (EUA). This EUA will remain in effect (meaning this test can be used) for the duration of the COVID-19 declaration under Section 564(b)(1) of the Act, 21 U.S.C. section 360bbb-3(b)(1), unless the authorization is terminated or revoked.  Performed at [facility], [place] [address], [place], [place] [address]   Blood culture (routine single)     Status: None   Collection Time: 03/04/21  4:29 PM   Specimen:  BLOOD  Result Value Ref Range Status   Specimen Description   Final    BLOOD RIGHT ANTECUBITAL Performed at [facility] [address] [place], [place] [address]    Special Requests   Final    BOTTLES DRAWN AEROBIC AND ANAEROBIC Blood Culture adequate volume Performed at [facility] [address], [place], [place] [address]    Culture   Final    NO GROWTH 5 DAYS Performed at [facility], [place] [address] [place], [place] [address]    Report Status 03/09/2021 FINAL  Final        Radiology Studies: No results found.      Scheduled Meds:  amLODipine  5 mg Oral Daily   amoxicillin-clavulanate  1 tablet Oral Q12H   aspirin EC  81 mg Oral Daily   doxycycline  100 mg Oral Q12H   enoxaparin (LOVENOX) injection  40 mg Subcutaneous Q24H   furosemide  40 mg Oral Daily   levothyroxine  25 mcg Oral Daily   metoprolol succinate  12.5 mg Oral Daily   multivitamin with minerals  1 tablet Oral Q lunch   mupirocin ointment   Topical BID   thiamine  100 mg Oral Daily   Continuous Infusions:   LOS: 6 days     Cordelia Poche, MD [facility] 03/10/2021, 4:12 PM  If 7PM-7AM, please contact night-coverage [email or web address]

## 2021-03-10 NOTE — Progress Notes (Signed)
Occupational Therapy Treatment Patient Details Name: Michaela Baker MRN: [id number] DOB: Mar 04, 1951 Today's Date: 03/10/2021   History of present illness 70 y.o. female presented to the ED after fall at home and remained on floor for 3 hrs unable to get up, admitted for cellulitis and acute kidney injury. PMH significant for type II DM, hypothyroidism, HTN, and unspecified CHF.   OT comments  Pt with progress towards goals this session. Pt performed LE and UE AAROM exercises for pain and edema management as a preparatory activity prior to functional mobility (see below) Pt required supervision for bed mobility and Max A +2 assistance with Stedy for functional mobility to chair. Pt stood for 2.5 minutes inside [place]. SpO2 94% on 2L and dropped to 85% on room air with functional mobility. Reapplied room air and encouraged pursed-lip breathing, SpO2 returning to 93%. Continuing to recommend CIR post d/c.   Recommendations for follow up therapy are one component of a multi-disciplinary discharge planning process, led by the attending physician.  Recommendations may be updated based on patient status, additional functional criteria and insurance authorization.    Follow Up Recommendations  CIR    Equipment Recommendations  Other (comment) (defer to next venue)    Recommendations for Other Services      Precautions / Restrictions Precautions Precautions: Fall Precaution Comments: Has R heel wound, R LE edema, R UE edema Restrictions Weight Bearing Restrictions: No       Mobility Bed Mobility Overal bed mobility: Needs Assistance Bed Mobility: Supine to Sit     Supine to sit: Supervision     General bed mobility comments: For safety, cued to scoot further to EOB. HOB raised    Transfers Overall transfer level: Needs assistance   Transfers: Sit to/from Stand Sit to Stand: Max assist;+2 physical assistance;+2 safety/equipment;From elevated surface          General transfer  comment: Attempted to sit to stand with bed in lowest position with Stedy, unable to stand with assist. Able to stand from raised surface with Stedy and max A+2 assist    Balance Overall balance assessment: Needs assistance Sitting-balance support: No upper extremity supported;Feet supported Sitting balance-Leahy Scale: Good     Standing balance support: Bilateral upper extremity supported Standing balance-Leahy Scale: Poor Standing balance comment: use of external support                           ADL either performed or assessed with clinical judgement   ADL Overall ADL's : Needs assistance/impaired                 Upper Body Dressing : Set up;Sitting Upper Body Dressing Details (indicate cue type and reason): donned gown, needed assistance to tie neck                   General ADL Comments: Focused tx session on LE exercises and functional mobility using [employer]     Praxis      Cognition Arousal/Alertness: Awake/alert Behavior During Therapy: WFL for tasks assessed/performed Overall Cognitive Status: Within Functional Limits for tasks assessed                                 General Comments: Recalled correct use of Stedy equipment and process.        Exercises  Exercises: General Lower Extremity;Hand exercises General Exercises - Lower Extremity Ankle Circles/Pumps: AROM;5 reps;Supine;Both Straight Leg Raises: 5 reps;Both;AAROM;Supine;Limitations (Requiring assist to complete full ROM) Hip Flexion/Marching: 5 reps;Both;AAROM;Supine;Limitations (Needing assist to complete full ROM) Heel Raises: 10 reps;Seated;AROM;Limitations (Unable to actively extend r knee past 1/4 ROM due to LE weakness and edema on R side.) Hand Exercises Wrist Extension: AROM; 10 reps;Supine;Both;Limitations (Unable to complete last 1/4 wrist extension on R side due to wrist fx in past) Other Exercises Other Exercises: Hand  squeezes with foam ball; 10 repetitions; R hand   Shoulder Instructions       General Comments      Pertinent Vitals/ Pain       Pain Assessment: Faces Faces Pain Scale: Hurts even more Pain Location: R leg Pain Descriptors / Indicators: Discomfort;Grimacing;Moaning Pain Intervention(s): Monitored during session;Utilized relaxation techniques  Home Living                                          Prior Functioning/Environment              Frequency  Min 2X/week        Progress Toward Goals  OT Goals(current goals can now be found in the care plan section)  Progress towards OT goals: Progressing toward goals  Acute Rehab OT Goals Patient Stated Goal: To get stronger OT Goal Formulation: With patient Time For Goal Achievement: 03/19/21 Potential to Achieve Goals: Good ADL Goals Pt Will Perform Grooming: standing;with min guard assist Pt Will Perform Upper Body Dressing: with set-up;sitting Pt Will Perform Lower Body Dressing: sit to/from stand;with mod assist Pt Will Transfer to Toilet: with min assist;bedside commode Pt Will Perform Toileting - Clothing Manipulation and hygiene: with min assist;sit to/from stand  Plan Discharge plan remains appropriate    Co-evaluation                 AM-PAC OT "6 Clicks" Daily Activity     Outcome Measure   Help from another person eating meals?: A Little Help from another person taking care of personal grooming?: A Little Help from another person toileting, which includes using toliet, bedpan, or urinal?: A Lot Help from another person bathing (including washing, rinsing, drying)?: A Lot Help from another person to put on and taking off regular upper body clothing?: A Little Help from another person to put on and taking off regular lower body clothing?: A Lot 6 Click Score: 15    End of Session Equipment Utilized During Treatment: Rolling walker;Oxygen  OT Visit Diagnosis: Unsteadiness on feet  (R26.81);Other abnormalities of gait and mobility (R26.89);History of falling (Z91.81);Muscle weakness (generalized) (M62.81);Pain   Activity Tolerance Patient tolerated treatment well   Patient Left in chair;with call bell/phone within reach;with chair alarm set   Nurse Communication Mobility status        Time: 1400-1430 OT Time Calculation (min): 30 min  Charges: OT General Charges $OT Visit: 1 Visit OT Treatments $Therapeutic Activity: 8-22 mins $Therapeutic Exercise: 8-22 mins  Jackquline Denmark, OTS Acute Rehab Office: [phone]   Corey Laski 03/10/2021, 4:31 PM

## 2021-03-10 NOTE — Progress Notes (Signed)
   03/10/21 1627  Assess: MEWS Score  Temp 100.3 F (37.9 C)  BP (!) 175/84  Pulse Rate (!) 109  Resp (!) 24  SpO2 98 %  Assess: MEWS Score  MEWS Temp 0  MEWS Systolic 0  MEWS Pulse 1  MEWS RR 1  MEWS LOC 0  MEWS Score 2  MEWS Score Color Yellow  Assess: if the MEWS score is Yellow or Red  Were vital signs taken at a resting state? Yes  Focused Assessment Change from prior assessment (see assessment flowsheet)  Does the patient meet 2 or more of the SIRS criteria? Yes  Does the patient have a confirmed or suspected source of infection? Yes  Provider and Rapid Response Notified? Yes  MEWS guidelines implemented *See Row Information* Yes  Treat  MEWS Interventions Administered prn meds/treatments  Pain Scale 0-10  Pain Score 0  Take Vital Signs  Increase Vital Sign Frequency  Yellow: Q 2hr X 2 then Q 4hr X 2, if remains yellow, continue Q 4hrs  Escalate  MEWS: Escalate Yellow: discuss with charge nurse/RN and consider discussing with provider and RRT  Notify: Charge Nurse/RN  Name of Charge Nurse/RN Notified Jackie, RN  Date Charge Nurse/RN Notified 03/10/21  Time Charge Nurse/RN Notified 1628  Notify: Provider  Provider Name/Title Cordelia Poche, MD  Date Provider Notified 03/10/21  Time Provider Notified 33  Notification Type Page  Notification Reason Change in status  Provider response See new orders  Date of Provider Response 03/10/21  Time of Provider Response 1825  Notify: Rapid Response  Date Rapid Response Notified 03/10/21  Time Rapid Response Notified 74  Document  Patient Outcome Stabilized after interventions  Progress note created (see row info) Yes  Assess: SIRS CRITERIA  SIRS Temperature  0  SIRS Pulse 1  SIRS Respirations  1  SIRS WBC 0  SIRS Score Sum  2

## 2021-03-10 NOTE — Progress Notes (Signed)
Inpatient Rehab Admissions Coordinator:   Insurance has denied request for CIR. Pt. Will need to pursue rehab at another venue. TOC and Pt. Family notified.   Clemens Catholic, [place], [place] Admissions Coordinator  [phone] ([place]) [phone] (office)

## 2021-03-11 DIAGNOSIS — N179 Acute kidney failure, unspecified: Secondary | ICD-10-CM | POA: Diagnosis not present

## 2021-03-11 DIAGNOSIS — E039 Hypothyroidism, unspecified: Secondary | ICD-10-CM | POA: Diagnosis not present

## 2021-03-11 DIAGNOSIS — E871 Hypo-osmolality and hyponatremia: Secondary | ICD-10-CM | POA: Diagnosis not present

## 2021-03-11 DIAGNOSIS — L03115 Cellulitis of right lower limb: Secondary | ICD-10-CM | POA: Diagnosis not present

## 2021-03-11 LAB — GLUCOSE, CAPILLARY
Glucose-Capillary: 89 mg/dL (ref 70–99)
Glucose-Capillary: 97 mg/dL (ref 70–99)
Glucose-Capillary: 81 mg/dL (ref 70–99)
Glucose-Capillary: 121 mg/dL — ABNORMAL HIGH (ref 70–99)

## 2021-03-11 LAB — CBC
HCT: 34.3 % — ABNORMAL LOW (ref 36.0–46.0)
Hemoglobin: 10.5 g/dL — ABNORMAL LOW (ref 12.0–15.0)
MCH: 25.8 pg — ABNORMAL LOW (ref 26.0–34.0)
MCHC: 30.6 g/dL (ref 30.0–36.0)
MCV: 84.3 fL (ref 80.0–100.0)
Platelets: 308 10*3/uL (ref 150–400)
RBC: 4.07 MIL/uL (ref 3.87–5.11)
RDW: 16.4 % — ABNORMAL HIGH (ref 11.5–15.5)
WBC: 8.5 10*3/uL (ref 4.0–10.5)
nRBC: 0 % (ref 0.0–0.2)

## 2021-03-11 NOTE — Progress Notes (Signed)
Physical Therapy Treatment Patient Details Name: Michaela Baker MRN: [id number] DOB: 1951-02-24 Today's Date: 03/11/2021   History of Present Illness 70 y.o. female presented to the ED after fall at home and remained on floor for 3 hrs unable to get up, admitted for cellulitis and acute kidney injury. PMH significant for type II DM, hypothyroidism, HTN, and unspecified CHF.    PT Comments    Pt up in chair upon arrival to room, states she has been up in chair since 7:00 this am. Pt requiring max assist to rise from low surface of recliner, with multiple unsuccessful attempts before successfully completed. Once in elevated position in stedy, able to perform multiple sit<>stands with min assist. PT to continue to follow.     Recommendations for follow up therapy are one component of a multi-disciplinary discharge planning process, led by the attending physician.  Recommendations may be updated based on patient status, additional functional criteria and insurance authorization.  Follow Up Recommendations  SNF     Equipment Recommendations  None recommended by PT    Recommendations for Other Services       Precautions / Restrictions Precautions Precautions: Fall Precaution Comments: Has R heel wound, R LE edema, R UE edema Restrictions Weight Bearing Restrictions: No     Mobility  Bed Mobility Overal bed mobility: Needs Assistance Bed Mobility: Sit to Supine       Sit to supine: HOB elevated;Max assist   General bed mobility comments: max assist for LE lifting into bed, trunk lowering, and boost up in bed with use of bed pads.    Transfers Overall transfer level: Needs assistance Equipment used: Ambulation equipment used Transfers: Sit to/from Stand Sit to Stand: Max assist         General transfer comment: max assist for power up, hip rise via posterior facilitation, trunk elevation, and steadying upon standing. cues for pulling through UEs on stedy bar. Stand  attempts x4 before successful stand from recliner achieved. sit<>stand x4 once in stedy.  Ambulation/Gait             General Gait Details: unable at this time   Stairs             Wheelchair Mobility    Modified Rankin (Stroke Patients Only)       Balance Overall balance assessment: Needs assistance Sitting-balance support: No upper extremity supported;Feet supported Sitting balance-Leahy Scale: Good     Standing balance support: Bilateral upper extremity supported Standing balance-Leahy Scale: Poor Standing balance comment: use of external support                            Cognition Arousal/Alertness: Awake/alert Behavior During Therapy: WFL for tasks assessed/performed Overall Cognitive Status: Within Functional Limits for tasks assessed                                 General Comments: requires step-by-step cuing for transfers and standing      Exercises      General Comments General comments (skin integrity, edema, etc.): soiled in urine, PT assisted pt out of wet briefs and gown      Pertinent Vitals/Pain Pain Assessment: Faces Faces Pain Scale: Hurts a little bit Pain Location: RUE/LE Pain Descriptors / Indicators: Discomfort;Sore Pain Intervention(s): Limited activity within patient's tolerance;Monitored during session;Repositioned    Home Living  Prior Function            PT Goals (current goals can now be found in the care plan section) Acute Rehab PT Goals Patient Stated Goal: To get stronger PT Goal Formulation: With patient Time For Goal Achievement: 03/19/21 Potential to Achieve Goals: Good Progress towards PT goals: Progressing toward goals    Frequency    Min 3X/week      PT Plan Current plan remains appropriate    Co-evaluation              AM-PAC PT "6 Clicks" Mobility   Outcome Measure  Help needed turning from your back to your side while in a flat  bed without using bedrails?: A Lot Help needed moving from lying on your back to sitting on the side of a flat bed without using bedrails?: A Lot Help needed moving to and from a bed to a chair (including a wheelchair)?: Total Help needed standing up from a chair using your arms (e.g., wheelchair or bedside chair)?: Total Help needed to walk in hospital room?: Total Help needed climbing 3-5 steps with a railing? : Total 6 Click Score: 8    End of Session Equipment Utilized During Treatment: Gait belt Activity Tolerance: Patient tolerated treatment well Patient left: with call bell/phone within reach;in bed;with bed alarm set Nurse Communication: Mobility status PT Visit Diagnosis: Unsteadiness on feet (R26.81);Muscle weakness (generalized) (M62.81);Difficulty in walking, not elsewhere classified (R26.2)     Time: 5361-4431 PT Time Calculation (min) (ACUTE ONLY): 17 min  Charges:  $Therapeutic Activity: 8-22 mins                    [name], PT DPT Acute Rehabilitation Services Pager [phone]  Office [phone]     [name] 03/11/2021, 4:18 PM

## 2021-03-11 NOTE — Progress Notes (Signed)
PROGRESS NOTE    Michaela Baker  LAG:[id number] DOB: 1950-07-07 DOA: 03/03/2021 PCP: [name], FNP   Brief Narrative: Michaela Baker is a 70 y.o. female with a history of unspecified CHF, hypertension, class II obesity, history of PVCs, type II DM, hypothyroidism, OSA. Patient presented after a fall with worsening right foot pain. She was found to have a right foot cellulitis and started on IV antibiotics..   Assessment & Plan:   Principal Problem:   AKI (acute kidney injury) ([place]) Active Problems:   HTN (hypertension)   OSA (obstructive sleep apnea)   Hypothyroidism   Class II obesity   Hyponatremia   Abnormal LFTs   Cellulitis of right lower extremity   Sepsis ([place])   AKI Baseline creatinine of about 0.9-1 from 4 years prior. Creatinine of 1.81 on admission. Secondary to dehydration and resultant prerenal azotemia. Patient started on IV fluids with improvement of creatinine and resolution of AKI  Right LE cellulitis LE venous duplex without evidence of VTE. Patient empirically started on Vancomycin/Ceftriaxone/Flagyl; Flagyl discontinued on HD #3. Transitioned to doxycycline and Augmentin after continued clinical improvement on IV antibiotics. Leukocytosis resolved. Elevated temperature on 10/12 with Tmax of 100.3 F. -Continue Augmentin/doxycycline -Repeat blood cultures (10/12) pending -If continued clinical concern for ongoing infection, may need CT leg to rule out abscess, but she seems to be improved today without change in therapy  Sepsis Not present on admission. Secondary to cellulitis. Blood cultures with no growth. Antibiotics as mentioned above.  Hyponatremia Resolved with hydration.  Acute metabolic encephalopathy Likely related to acute illness. Mostly resolved.  Hypothyroidism -Continue Synthroid 25 mcg daily  Primary hypertension -Continue Toprol XL and amlodipine   DVT prophylaxis: Lovenox Code Status:   Code Status: Full Code Family  Communication: None at bedside. Called daughter via telephone, but no answer Disposition Plan: Discharge to SNF when bed is available   Consultants:  None  Procedures:  LE VENOUS DUPLEX (10/5) BILATERAL ABI (10/5) Summary:  Right: Resting right ankle-brachial index indicates noncompressible right  lower extremity arteries. The right toe-brachial index is normal.   TBI shows borderline small vessel disease.  Left: Resting left ankle-brachial index indicates noncompressible left  lower extremity arteries. The left toe-brachial index is normal.   Antimicrobials: Vancomycin IV Ceftriaxone IV Flagyl IV Augmentin PO Doxycycline PO    Subjective: Patient reports improvement of RLE pain. No other issues.  Objective: Vitals:   03/10/21 2020 03/11/21 0045 03/11/21 0426 03/11/21 1201  BP: (!) 113/58 114/75 129/70 119/71  Pulse: 69 74 73 76  Resp: 20 (!) 24 18   Temp: 98.2 F (36.8 C) 98.8 F (37.1 C) 98.5 F (36.9 C) 98.5 F (36.9 C)  TempSrc: Oral Oral Oral Oral  SpO2: 96% 96% 97% 97%  Weight:      Height:        Intake/Output Summary (Last 24 hours) at 03/11/2021 1400 Last data filed at 03/11/2021 1100 Gross per 24 hour  Intake 360 ml  Output 1250 ml  Net -890 ml    Filed Weights   03/03/21 1100 03/03/21 1726  Weight: 94.3 kg 81.5 kg    Examination:  General exam: Appears calm and comfortable Respiratory system: Clear to auscultation. Respiratory effort normal. Cardiovascular system: S1 & S2 heard, RRR. No murmurs, rubs, gallops or clicks. Gastrointestinal system: Abdomen is nondistended, soft and nontender. No organomegaly or masses felt. Normal bowel sounds heard. Central nervous system: Alert and oriented. No focal neurological deficits. Musculoskeletal: RLE  with 2+ edema, slight warmth, no erythema and moderate tenderness.  Skin: No cyanosis. No rashes Psychiatry: Judgement and insight appear normal. Mood & affect appropriate.     Data Reviewed: I  have personally reviewed following labs and imaging studies  CBC Lab Results  Component Value Date   WBC 8.5 03/11/2021   RBC 4.07 03/11/2021   HGB 10.5 (L) 03/11/2021   HCT 34.3 (L) 03/11/2021   MCV 84.3 03/11/2021   MCH 25.8 (L) 03/11/2021   PLT 308 03/11/2021   MCHC 30.6 03/11/2021   RDW 16.4 (H) 03/11/2021   LYMPHSABS 1.1 03/06/2021   MONOABS 0.8 03/06/2021   EOSABS 0.0 03/06/2021   BASOSABS 0.1 95/28/4132     Last metabolic panel Lab Results  Component Value Date   NA 138 03/09/2021   K 3.5 03/09/2021   CL 108 03/09/2021   CO2 23 03/09/2021   BUN 23 03/09/2021   CREATININE 0.79 03/10/2021   GLUCOSE 92 03/09/2021   GFRNONAA >60 03/10/2021   GFRAA >60 03/29/2017   CALCIUM 8.2 (L) 03/09/2021   PHOS 3.6 07/25/2013   PROT 7.4 03/04/2021   ALBUMIN 2.1 (L) 03/04/2021   BILITOT 1.0 03/04/2021   ALKPHOS 38 03/04/2021   AST 45 (H) 03/04/2021   ALT 20 03/04/2021   ANIONGAP 7 03/09/2021    CBG (last 3)  Recent Labs    03/10/21 2234 03/11/21 0830 03/11/21 1203  GLUCAP 102* 89 97      GFR: Estimated Creatinine Clearance: 68 mL/min (by C-G formula based on SCr of 0.79 mg/dL).  Coagulation Profile: No results for input(s): INR, PROTIME in the last 168 hours.   Recent Results (from the past 240 hour(s))  Resp Panel by RT-PCR (Flu A&B, Covid) Nasopharyngeal Swab     Status: None   Collection Time: 03/03/21  2:23 PM   Specimen: Nasopharyngeal Swab; Nasopharyngeal(NP) swabs in vial transport medium  Result Value Ref Range Status   SARS Coronavirus 2 by RT PCR NEGATIVE NEGATIVE Final    Comment: (NOTE) SARS-CoV-2 target nucleic acids are NOT DETECTED.  The SARS-CoV-2 RNA is generally detectable in upper respiratory specimens during the acute phase of infection. The lowest concentration of SARS-CoV-2 viral copies this assay can detect is 138 copies/mL. A negative result does not preclude SARS-Cov-2 infection and should not be used as the sole basis for  treatment or other patient management decisions. A negative result may occur with  improper specimen collection/handling, submission of specimen other than nasopharyngeal swab, presence of viral mutation(s) within the areas targeted by this assay, and inadequate number of viral copies(<138 copies/mL). A negative result must be combined with clinical observations, patient history, and epidemiological information. The expected result is Negative.  Fact Sheet for Patients:  EntrepreneurPulse.com.au  Fact Sheet for Healthcare Providers:  IncredibleEmployment.be  This test is no t yet approved or cleared by the [place] FDA and  has been authorized for detection and/or diagnosis of SARS-CoV-2 by FDA under an Emergency Use Authorization (EUA). This EUA will remain  in effect (meaning this test can be used) for the duration of the COVID-19 declaration under Section 564(b)(1) of the Act, 21 U.S.C.section 360bbb-3(b)(1), unless the authorization is terminated  or revoked sooner.       Influenza A by PCR NEGATIVE NEGATIVE Final   Influenza B by PCR NEGATIVE NEGATIVE Final    Comment: (NOTE) The Xpert Xpress SARS-CoV-2/FLU/RSV plus assay is intended as an aid in the diagnosis of influenza from Nasopharyngeal swab specimens and  should not be used as a sole basis for treatment. Nasal washings and aspirates are unacceptable for Xpert Xpress SARS-CoV-2/FLU/RSV testing.  Fact Sheet for Patients: EntrepreneurPulse.com.au  Fact Sheet for Healthcare Providers: IncredibleEmployment.be  This test is not yet approved or cleared by the [place] FDA and has been authorized for detection and/or diagnosis of SARS-CoV-2 by FDA under an Emergency Use Authorization (EUA). This EUA will remain in effect (meaning this test can be used) for the duration of the COVID-19 declaration under Section 564(b)(1) of the Act, 21  U.S.C. section 360bbb-3(b)(1), unless the authorization is terminated or revoked.  Performed at [facility], [place] [address], [place], [place] [address]   Blood culture (routine single)     Status: None   Collection Time: 03/04/21  4:29 PM   Specimen: BLOOD  Result Value Ref Range Status   Specimen Description   Final    BLOOD RIGHT ANTECUBITAL Performed at [facility] [address], [place], [place] [address]    Special Requests   Final    BOTTLES DRAWN AEROBIC AND ANAEROBIC Blood Culture adequate volume Performed at [facility] [address], [place], [place] [address]    Culture   Final    NO GROWTH 5 DAYS Performed at [facility], [place] [address] [place], [place] [address]    Report Status 03/09/2021 FINAL  Final  Culture, blood (routine x 2)     Status: None (Preliminary result)   Collection Time: 03/10/21  7:14 PM   Specimen: BLOOD  Result Value Ref Range Status   Specimen Description   Final    BLOOD RIGHT ANTECUBITAL Performed at [facility] [address] [place], [place] [address]    Special Requests   Final    BOTTLES DRAWN AEROBIC ONLY Blood Culture adequate volume Performed at [facility] [address] [place], [place] [address]    Culture   Final    NO GROWTH < 12 HOURS Performed at [facility] [address] [place], [place] [address]    Report Status PENDING  Incomplete  Culture, blood (routine x 2)     Status: None (Preliminary result)   Collection Time: 03/10/21  7:14 PM   Specimen: BLOOD LEFT FOREARM  Result Value Ref Range Status   Specimen Description   Final    BLOOD LEFT FOREARM Performed at [facility] [address], [place], [place] [address]    Special Requests   Final    BOTTLES DRAWN AEROBIC ONLY Blood Culture adequate volume Performed at [facility] [address],  [place], [place] [address]    Culture   Final    NO GROWTH < 12 HOURS Performed at [facility] [address] [place],  [address]    Report Status PENDING  Incomplete         Radiology Studies: No results found.      Scheduled Meds:  amLODipine  5 mg Oral Daily   amoxicillin-clavulanate  1 tablet Oral Q12H   aspirin EC  81 mg Oral Daily   doxycycline  100 mg Oral Q12H   enoxaparin (LOVENOX) injection  40 mg Subcutaneous Q24H   furosemide  40 mg Oral Daily   levothyroxine  25 mcg Oral Daily   metoprolol succinate  12.5 mg Oral Daily   multivitamin with minerals  1 tablet Oral Q lunch   mupirocin ointment  Topical BID   thiamine  100 mg Oral Daily   Continuous Infusions:   LOS: 7 days     Cordelia Poche, MD [facility] 03/11/2021, 2:00 PM  If 7PM-7AM, please contact night-coverage [email or web address]

## 2021-03-11 NOTE — NC FL2 (Signed)
[place] LEVEL OF CARE SCREENING TOOL     IDENTIFICATION  Patient Name: Michaela Baker Birthdate: [birth year] Sex: female Admission Date (Current Location): 03/03/2021  [facility] and [place] Number:  Herbalist and Address:  [place],  [place], [place]      Provider Number: [id number]  Attending Physician Name and Address:  Mariel Aloe, MD  Relative Name and Phone Number:  [name], [name] (Daughter)   [phone]    Current Level of Care: Hospital Recommended Level of Care: [place] Prior Approval Number:    Date Approved/Denied:   PASRR Number:    Discharge Plan: SNF    Current Diagnoses: Patient Active Problem List   Diagnosis Date Noted   Sepsis ([place]) 03/04/2021   AKI (acute kidney injury) ([place]) 03/03/2021   Class II obesity 03/03/2021   Hyponatremia 03/03/2021   Abnormal LFTs 03/03/2021   Cellulitis of right lower extremity 03/03/2021   Dyspnea 03/29/2017   Hypothyroidism 03/29/2017   OSA (obstructive sleep apnea) 01/03/2017   Left knee pain 01/03/2017   MGUS (monoclonal gammopathy of unknown significance) 07/26/2013   CHF (congestive heart failure) ([place]) 07/22/2013   HTN (hypertension) 07/22/2013   Acute exacerbation of CHF (congestive heart failure) ([place]) 07/21/2013    Orientation RESPIRATION BLADDER Height & Weight     Self, Situation, Place  O2 (2L [place]) External catheter Weight: 81.5 kg Height:  5\' 5"  (165.1 cm)  BEHAVIORAL SYMPTOMS/MOOD NEUROLOGICAL BOWEL NUTRITION STATUS      Incontinent Diet (see d/c summary)  AMBULATORY STATUS COMMUNICATION OF NEEDS Skin   Extensive Assist Verbally Other (Comment) (Diabetic wound R Heel)                       Personal Care Assistance Level of Assistance  Bathing, Feeding, Dressing Bathing Assistance: Maximum assistance Feeding assistance: Independent Dressing Assistance: Maximum assistance     Functional Limitations Info   Sight, Hearing, Speech Sight Info: Adequate Hearing Info: Adequate Speech Info: Adequate    SPECIAL CARE FACTORS FREQUENCY  PT (By licensed PT), OT (By licensed OT)     PT Frequency: 5X/W OT Frequency: 5X/W            Contractures Contractures Info: Not present    Additional Factors Info  Code Status, Allergies Code Status Info: full Allergies Info: NKA           Current Medications (03/11/2021):  This is the current hospital active medication list Current Facility-Administered Medications  Medication Dose Route Frequency Provider Last Rate Last Admin   acetaminophen (TYLENOL) tablet 650 mg  650 mg Oral Q6H PRN Reubin [place], MD   650 mg at 03/11/21 0121   Or   acetaminophen (TYLENOL) suppository 650 mg  650 mg Rectal Q6H PRN Reubin Milan, MD       amLODipine (NORVASC) tablet 5 mg  5 mg Oral Daily Barton Dubois, MD   5 mg at 03/11/21 1054   amoxicillin-clavulanate (AUGMENTIN) 875-125 MG per tablet 1 tablet  1 tablet Oral Q12H Barton Dubois, MD   1 tablet at 03/11/21 1055   aspirin EC tablet 81 mg  81 mg Oral Daily Reubin Milan, MD   81 mg at 03/11/21 1054   doxycycline (VIBRA-TABS) tablet 100 mg  100 mg Oral Q12H Barton Dubois, MD   100 mg at 03/11/21 1054   enoxaparin (LOVENOX) injection 40 mg  40 mg Subcutaneous Q24H Reubin Milan, MD  40 mg at 03/10/21 2124   furosemide (LASIX) tablet 40 mg  40 mg Oral Daily Arrien, Jimmy Picket, MD   40 mg at 03/11/21 1054   levothyroxine (SYNTHROID) tablet 25 mcg  25 mcg Oral Daily Reubin Milan, MD   25 mcg at 03/11/21 0746   metoprolol succinate (TOPROL-XL) 24 hr tablet 12.5 mg  12.5 mg Oral Daily Barton Dubois, MD   12.5 mg at 03/11/21 1054   multivitamin with minerals tablet 1 tablet  1 tablet Oral Q lunch Minda Ditto, RPH   1 tablet at 03/11/21 1241   mupirocin ointment (BACTROBAN) 2 %   Topical BID Tawni Millers, MD   Given at 03/11/21 0115   ondansetron (ZOFRAN) tablet 4 mg   4 mg Oral Q6H PRN Reubin Milan, MD       Or   ondansetron [facility]) injection 4 mg  4 mg Intravenous Q6H PRN Reubin Milan, MD       thiamine tablet 100 mg  100 mg Oral Daily Reubin Milan, MD   100 mg at 03/11/21 1054     Discharge Medications: Please see discharge summary for a list of discharge medications.  Relevant Imaging Results:  Relevant Lab Results:   Additional Information [phone]  [place], [place]

## 2021-03-11 NOTE — TOC Initial Note (Signed)
Transition of Care [facility]) - Initial/Assessment Note    Patient Details  Name: Michaela Baker MRN: [id number] Date of Birth: 29-Apr-1951  Transition of Care [facility]) CM/SW Contact:    Trish Mage, LCSW Phone Number: 03/11/2021, 3:00 PM  Clinical Narrative:   Patient seen in follow up to PT recommendation of SNF, insurance declination for CIR service.  Spoke with patient, son and daughter. They are reluctant to send patient to SNF as daughter and granddaughter both work at [employer] and SNF's, and are familiar with pros and cons.  However, they also understand that she is two person assist now and they likely will be unable to manage her at home.  They agreed to allow me to send out bed search, with the agreement that they will be intimately involved in bed selection, and if there are none to their liking, they will take her home with [facility] services. Bed search initiated. TOC will continue to follow during the course of hospitalization.                 Expected Discharge Plan: Skilled Nursing Facility Barriers to Discharge: SNF Pending bed offer   Patient Goals and CMS Choice     Choice offered to / list presented to : Patient, Adult Children  Expected Discharge Plan and Services Expected Discharge Plan: [place]   Discharge Planning Services: CM Consult Post Acute Care Choice: [place] Living arrangements for the past 2 months: Apartment                                      Prior Living Arrangements/Services Living arrangements for the past 2 months: Apartment Lives with:: Adult Children Patient language and need for interpreter reviewed:: Yes        Need for Family Participation in Patient Care: Yes (Comment) Care giver support system in place?: Yes (comment) Current home services: DME Criminal Activity/Legal Involvement Pertinent to Current Situation/Hospitalization: No - Comment as needed  Activities of Daily Living Home Assistive  Devices/Equipment: Eyeglasses, CPAP (has CPAP but does not use it) ADL Screening (condition at time of admission) Patient's cognitive ability adequate to safely complete daily activities?: Yes Is the patient deaf or have difficulty hearing?: No Does the patient have difficulty seeing, even when wearing glasses/contacts?: No Does the patient have difficulty concentrating, remembering, or making decisions?: Yes Patient able to express need for assistance with ADLs?: Yes Does the patient have difficulty dressing or bathing?: Yes Independently performs ADLs?: No (secondary to weakness) Communication: Independent Dressing (OT): Needs assistance Is this a change from baseline?: Change from baseline, expected to last >3 days Grooming: Needs assistance Is this a change from baseline?: Change from baseline, expected to last >3 days Feeding: Needs assistance Is this a change from baseline?: Change from baseline, expected to last >3 days Bathing: Needs assistance Is this a change from baseline?: Change from baseline, expected to last >3 days Toileting: Needs assistance Is this a change from baseline?: Change from baseline, expected to last >3days In/Out Bed: Needs assistance Is this a change from baseline?: Change from baseline, expected to last >3 days Walks in Home: Needs assistance Is this a change from baseline?: Change from baseline, expected to last >3 days Does the patient have difficulty walking or climbing stairs?: Yes (secondary to weakness and a fall) Weakness of Legs: Both Weakness of Arms/Hands: None  Permission Sought/Granted   Permission  granted to share information with : Yes, Verbal Permission Granted  Share Information with NAME: Jolina, Symonds (Daughter)   [phone]           Emotional Assessment Appearance:: Appears stated age Attitude/Demeanor/Rapport: Engaged Affect (typically observed): Appropriate Orientation: : Oriented to Self, Oriented to Place, Oriented to  Situation Alcohol / Substance Use: Not Applicable Psych Involvement: No (comment)  Admission diagnosis:  Fall [W19.XXXA] Cellulitis of right lower extremity [K81.594] AKI (acute kidney injury) ([place]) [N17.9] Patient Active Problem List   Diagnosis Date Noted   Sepsis ([place]) 03/04/2021   AKI (acute kidney injury) ([place]) 03/03/2021   Class II obesity 03/03/2021   Hyponatremia 03/03/2021   Abnormal LFTs 03/03/2021   Cellulitis of right lower extremity 03/03/2021   Dyspnea 03/29/2017   Hypothyroidism 03/29/2017   OSA (obstructive sleep apnea) 01/03/2017   Left knee pain 01/03/2017   MGUS (monoclonal gammopathy of unknown significance) 07/26/2013   CHF (congestive heart failure) ([place]) 07/22/2013   HTN (hypertension) 07/22/2013   Acute exacerbation of CHF (congestive heart failure) ([place]) 07/21/2013   PCP:  [name], FNP Pharmacy:   [place], [place] - [address] [place] [address] Phone: [phone] Fax: [phone]     Social Determinants of Health ([place]) Interventions    Readmission Risk Interventions No flowsheet data found.

## 2021-03-11 NOTE — [place] Notes (Signed)
Pts lower R lateral leg has an area that has a ruptured blister ( skin is intact but moveable) Covered with a vasoline gauze, ABD pad & kerlix wrap

## 2021-03-12 ENCOUNTER — Inpatient Hospital Stay (HOSPITAL_COMMUNITY): Payer: Medicare Other

## 2021-03-12 DIAGNOSIS — L03115 Cellulitis of right lower limb: Secondary | ICD-10-CM | POA: Diagnosis not present

## 2021-03-12 DIAGNOSIS — E039 Hypothyroidism, unspecified: Secondary | ICD-10-CM | POA: Diagnosis not present

## 2021-03-12 DIAGNOSIS — E871 Hypo-osmolality and hyponatremia: Secondary | ICD-10-CM | POA: Diagnosis not present

## 2021-03-12 DIAGNOSIS — N179 Acute kidney failure, unspecified: Secondary | ICD-10-CM | POA: Diagnosis not present

## 2021-03-12 LAB — GLUCOSE, CAPILLARY
Glucose-Capillary: 90 mg/dL (ref 70–99)
Glucose-Capillary: 88 mg/dL (ref 70–99)
Glucose-Capillary: 86 mg/dL (ref 70–99)
Glucose-Capillary: 95 mg/dL (ref 70–99)

## 2021-03-12 NOTE — [place] Notes (Signed)
PROGRESS NOTE    ADELAYDE Baker  RCB:[id number] DOB: 09-01-1950 DOA: 03/03/2021 PCP: [name], FNP   Brief Narrative: Michaela Baker is a 70 y.o. female with a history of unspecified CHF, hypertension, class II obesity, history of PVCs, type II DM, hypothyroidism, OSA. Patient presented after a fall with worsening right foot pain. She was found to have a right foot cellulitis and started on IV antibiotics..   Assessment & Plan:   Principal Problem:   AKI (acute kidney injury) ([place]) Active Problems:   HTN (hypertension)   OSA (obstructive sleep apnea)   Hypothyroidism   Class II obesity   Hyponatremia   Abnormal LFTs   Cellulitis of right lower extremity   Sepsis ([place])   AKI Baseline creatinine of about 0.9-1 from 4 years prior. Creatinine of 1.81 on admission. Secondary to dehydration and resultant prerenal azotemia. Patient started on IV fluids with improvement of creatinine and resolution of AKI  Right LE cellulitis LE venous duplex without evidence of VTE. Patient empirically started on Vancomycin/Ceftriaxone/Flagyl; Flagyl discontinued on HD #3. Transitioned to doxycycline and Augmentin after continued clinical improvement on IV antibiotics. Leukocytosis resolved. Elevated temperature on 10/12 with Tmax of 100.3 F. No recurrent fever. Blood cultures no growth to date. -Continue Augmentin/doxycycline; last day today (10/14)  Hypoxia Unsure of etiology. Recent x-ray with no acute findings. -Chest x-ray  Sepsis Not present on admission. Secondary to cellulitis. Blood cultures (10/6) with no growth. Antibiotics as mentioned above.  Hyponatremia Resolved with hydration.  Acute metabolic encephalopathy Likely related to acute illness. Mostly resolved.  Hypothyroidism -Continue Synthroid 25 mcg daily  Primary hypertension -Continue Toprol XL and amlodipine   DVT prophylaxis: Lovenox Code Status:   Code Status: Full Code Family Communication: None at  bedside. Disposition Plan: Discharge to SNF when bed is available and wean to room air   Consultants:  None  Procedures:  LE VENOUS DUPLEX (10/5) BILATERAL ABI (10/5) Summary:  Right: Resting right ankle-brachial index indicates noncompressible right  lower extremity arteries. The right toe-brachial index is normal.   TBI shows borderline small vessel disease.  Left: Resting left ankle-brachial index indicates noncompressible left  lower extremity arteries. The left toe-brachial index is normal.   Antimicrobials: Vancomycin IV Ceftriaxone IV Flagyl IV Augmentin PO Doxycycline PO    Subjective: No concerns today. Leg continues to feel better.  Objective: Vitals:   03/11/21 0426 03/11/21 1201 03/11/21 1947 03/12/21 0411  BP: 129/70 119/71 (!) 119/50 123/65  Pulse: 73 76 89 81  Resp: 18  (!) 24 (!) 22  Temp: 98.5 F (36.9 C) 98.5 F (36.9 C) 99.3 F (37.4 C) 99.9 F (37.7 C)  TempSrc: Oral Oral Oral Oral  SpO2: 97% 97% 94% 93%  Weight:      Height:        Intake/Output Summary (Last 24 hours) at 03/12/2021 1110 Last data filed at 03/12/2021 0700 Gross per 24 hour  Intake --  Output 950 ml  Net -950 ml    Filed Weights   03/03/21 1100 03/03/21 1726  Weight: 94.3 kg 81.5 kg    Examination:  General exam: Appears calm and comfortable  Respiratory system: Right basilar rales. Respiratory effort normal. Cardiovascular system: S1 & S2 heard, RRR. No murmurs, rubs, gallops or clicks. Gastrointestinal system: Abdomen is nondistended, soft and nontender. No organomegaly or masses felt. Normal bowel sounds heard. Central nervous system: Alert and oriented. No focal neurological deficits. Musculoskeletal: Right 2+ LE edema with mild circumferential  tenderness. Skin: No cyanosis. No rashes Psychiatry: Judgement and insight appear normal. Mood & affect appropriate.     Data Reviewed: I have personally reviewed following labs and imaging studies  CBC Lab  Results  Component Value Date   WBC 8.5 03/11/2021   RBC 4.07 03/11/2021   HGB 10.5 (L) 03/11/2021   HCT 34.3 (L) 03/11/2021   MCV 84.3 03/11/2021   MCH 25.8 (L) 03/11/2021   PLT 308 03/11/2021   MCHC 30.6 03/11/2021   RDW 16.4 (H) 03/11/2021   LYMPHSABS 1.1 03/06/2021   MONOABS 0.8 03/06/2021   EOSABS 0.0 03/06/2021   BASOSABS 0.1 72/53/6644     Last metabolic panel Lab Results  Component Value Date   NA 138 03/09/2021   K 3.5 03/09/2021   CL 108 03/09/2021   CO2 23 03/09/2021   BUN 23 03/09/2021   CREATININE 0.79 03/10/2021   GLUCOSE 92 03/09/2021   GFRNONAA >60 03/10/2021   GFRAA >60 03/29/2017   CALCIUM 8.2 (L) 03/09/2021   PHOS 3.6 07/25/2013   PROT 7.4 03/04/2021   ALBUMIN 2.1 (L) 03/04/2021   BILITOT 1.0 03/04/2021   ALKPHOS 38 03/04/2021   AST 45 (H) 03/04/2021   ALT 20 03/04/2021   ANIONGAP 7 03/09/2021    CBG (last 3)  Recent Labs    03/11/21 1743 03/11/21 2129 03/12/21 0731  GLUCAP 81 121* 90      GFR: Estimated Creatinine Clearance: 68 mL/min (by C-G formula based on SCr of 0.79 mg/dL).  Coagulation Profile: No results for input(s): INR, PROTIME in the last 168 hours.   Recent Results (from the past 240 hour(s))  Resp Panel by RT-PCR (Flu A&B, Covid) Nasopharyngeal Swab     Status: None   Collection Time: 03/03/21  2:23 PM   Specimen: Nasopharyngeal Swab; Nasopharyngeal(NP) swabs in vial transport medium  Result Value Ref Range Status   SARS Coronavirus 2 by RT PCR NEGATIVE NEGATIVE Final    Comment: (NOTE) SARS-CoV-2 target nucleic acids are NOT DETECTED.  The SARS-CoV-2 RNA is generally detectable in upper respiratory specimens during the acute phase of infection. The lowest concentration of SARS-CoV-2 viral copies this assay can detect is 138 copies/mL. A negative result does not preclude SARS-Cov-2 infection and should not be used as the sole basis for treatment or other patient management decisions. A negative result may occur  with  improper specimen collection/handling, submission of specimen other than nasopharyngeal swab, presence of viral mutation(s) within the areas targeted by this assay, and inadequate number of viral copies(<138 copies/mL). A negative result must be combined with clinical observations, patient history, and epidemiological information. The expected result is Negative.  Fact Sheet for Patients:  EntrepreneurPulse.com.au  Fact Sheet for Healthcare Providers:  IncredibleEmployment.be  This test is no t yet approved or cleared by the [place] FDA and  has been authorized for detection and/or diagnosis of SARS-CoV-2 by FDA under an Emergency Use Authorization (EUA). This EUA will remain  in effect (meaning this test can be used) for the duration of the COVID-19 declaration under Section 564(b)(1) of the Act, 21 U.S.C.section 360bbb-3(b)(1), unless the authorization is terminated  or revoked sooner.       Influenza A by PCR NEGATIVE NEGATIVE Final   Influenza B by PCR NEGATIVE NEGATIVE Final    Comment: (NOTE) The Xpert Xpress SARS-CoV-2/FLU/RSV plus assay is intended as an aid in the diagnosis of influenza from Nasopharyngeal swab specimens and should not be used as a sole basis for treatment.  Nasal washings and aspirates are unacceptable for Xpert Xpress SARS-CoV-2/FLU/RSV testing.  Fact Sheet for Patients: EntrepreneurPulse.com.au  Fact Sheet for Healthcare Providers: IncredibleEmployment.be  This test is not yet approved or cleared by the [place] FDA and has been authorized for detection and/or diagnosis of SARS-CoV-2 by FDA under an Emergency Use Authorization (EUA). This EUA will remain in effect (meaning this test can be used) for the duration of the COVID-19 declaration under Section 564(b)(1) of the Act, 21 U.S.C. section 360bbb-3(b)(1), unless the authorization is terminated  or revoked.  Performed at [facility], [place] [address] [place], [place] [address]   Blood culture (routine single)     Status: None   Collection Time: 03/04/21  4:29 PM   Specimen: BLOOD  Result Value Ref Range Status   Specimen Description   Final    BLOOD RIGHT ANTECUBITAL Performed at [facility] [address] [place], [place] [address]    Special Requests   Final    BOTTLES DRAWN AEROBIC AND ANAEROBIC Blood Culture adequate volume Performed at [place] [address] [place], [place] [address]    Culture   Final    NO GROWTH 5 DAYS Performed at [facility], [place] [address] [place], [place] [address]    Report Status 03/09/2021 FINAL  Final  Culture, blood (routine x 2)     Status: None (Preliminary result)   Collection Time: 03/10/21  7:14 PM   Specimen: BLOOD  Result Value Ref Range Status   Specimen Description   Final    BLOOD RIGHT ANTECUBITAL Performed at [facility] [address] [place], [place] [address]    Special Requests   Final    BOTTLES DRAWN AEROBIC ONLY Blood Culture adequate volume Performed at [facility] [address] [place], [place] [address]    Culture   Final    NO GROWTH 2 DAYS Performed at [facility] [address] [place], [place] [address]    Report Status PENDING  Incomplete  Culture, blood (routine x 2)     Status: None (Preliminary result)   Collection Time: 03/10/21  7:14 PM   Specimen: BLOOD LEFT FOREARM  Result Value Ref Range Status   Specimen Description   Final    BLOOD LEFT FOREARM Performed at [facility] [address], [place], [place] [address]    Special Requests   Final    BOTTLES DRAWN AEROBIC ONLY Blood Culture adequate volume Performed at [facility] [address] [place], [place] [address]    Culture   Final    NO GROWTH 2 DAYS Performed at  [facility] [address], [place], [place] [address]    Report Status PENDING  Incomplete         Radiology Studies: No results found.      Scheduled Meds:  amLODipine  5 mg Oral Daily   amoxicillin-clavulanate  1 tablet Oral Q12H   aspirin EC  81 mg Oral Daily   doxycycline  100 mg Oral Q12H   enoxaparin (LOVENOX) injection  40 mg Subcutaneous Q24H   furosemide  40 mg Oral Daily   levothyroxine  25 mcg Oral Daily   metoprolol succinate  12.5 mg Oral Daily   multivitamin with minerals  1 tablet Oral Q lunch   mupirocin ointment   Topical BID   thiamine  100 mg Oral Daily  Continuous Infusions:   LOS: 8 days     Cordelia Poche, MD [facility] 03/12/2021, 11:10 AM  If 7PM-7AM, please contact night-coverage [email or web address]

## 2021-03-12 NOTE — Progress Notes (Signed)
Occupational Therapy Treatment Patient Details Name: Michaela Baker MRN: [id number] DOB: 01/30/1951 Today's Date: 03/12/2021   History of present illness 70 y.o. female presented to the ED after fall at home and remained on floor for 3 hrs unable to get up, admitted for cellulitis and acute kidney injury. PMH significant for type II DM, hypothyroidism, HTN, and unspecified CHF.   OT comments  Pt with progress towards goals this session. Pt required Mod A for sit to stand and min guard functional mobility to bathroom with RW. Pt performed toilet transfer with Mod A and toileting hygiene with set up. SpO2 dropping to 80% on room air with activity, returned 2L and encouraging pursed lip breathing. SpO2 returning to 94%. Pt highly motivated to participate this session. Updating recommendation to SNF rehab due to CIR denial and need for short-term rehab post d/c. Will continue to follow in the acute setting.   Recommendations for follow up therapy are one component of a multi-disciplinary discharge planning process, led by the attending physician.  Recommendations may be updated based on patient status, additional functional criteria and insurance authorization.    Follow Up Recommendations  SNF    Equipment Recommendations  Other (comment) (defer to next venue)    Recommendations for Other Services      Precautions / Restrictions Precautions Precautions: Fall Precaution Comments: Has R heel wound, R LE edema, R UE edema Restrictions Weight Bearing Restrictions: No       Mobility Bed Mobility Overal bed mobility: Needs Assistance Bed Mobility: Sit to Supine     Supine to sit: Min assist;HOB elevated     General bed mobility comments: min A for pushing up into sitting    Transfers Overall transfer level: Needs assistance Equipment used: Ambulation equipment used Transfers: Sit to/from Stand Sit to Stand: Mod assist;From elevated surface         General transfer comment:  Mod A for power up into standing with RW.    Balance Overall balance assessment: Needs assistance Sitting-balance support: No upper extremity supported;Feet supported Sitting balance-Leahy Scale: Good     Standing balance support: Bilateral upper extremity supported Standing balance-Leahy Scale: Poor Standing balance comment: use of external support                           ADL either performed or assessed with clinical judgement   ADL Overall ADL's : Needs assistance/impaired     Grooming: Standing;Wash/dry hands Grooming Details (indicate cue type and reason): washed hands at sink                 Toilet Transfer: Ambulation;RW;regular toilet; Mod A Toilet Transfer Details (indicate cue type and reason): Mod A for sit to stand with RW, ambulated to bathroom Toileting- Clothing Manipulation and Hygiene: Set up;Sit to/from stand       Functional mobility during ADLs: Moderate assistance;Rolling walker       Vision       Perception     Praxis      Cognition Arousal/Alertness: Awake/alert Behavior During Therapy: WFL for tasks assessed/performed Overall Cognitive Status: Within Functional Limits for tasks assessed                                          Exercises     Shoulder Instructions       General Comments  Pertinent Vitals/ Pain       Pain Assessment: Faces Faces Pain Scale: No hurt Pain Intervention(s): Monitored during session  Home Living                                          Prior Functioning/Environment              Frequency  Min 2X/week        Progress Toward Goals  OT Goals(current goals can now be found in the care plan section)  Progress towards OT goals: Progressing toward goals  Acute Rehab OT Goals Patient Stated Goal: To get stronger OT Goal Formulation: With patient Time For Goal Achievement: 03/19/21 Potential to Achieve Goals: Good ADL Goals Pt Will  Perform Grooming: standing;with min guard assist Pt Will Perform Upper Body Dressing: with set-up;sitting Pt Will Perform Lower Body Dressing: sit to/from stand;with mod assist Pt Will Transfer to Toilet: with min assist;bedside commode Pt Will Perform Toileting - Clothing Manipulation and hygiene: with min assist;sit to/from stand  Plan Discharge plan remains appropriate;Discharge plan needs to be updated    Co-evaluation                 AM-PAC OT "6 Clicks" Daily Activity     Outcome Measure   Help from another person eating meals?: A Little Help from another person taking care of personal grooming?: A Little Help from another person toileting, which includes using toliet, bedpan, or urinal?: A Little Help from another person bathing (including washing, rinsing, drying)?: A Lot Help from another person to put on and taking off regular upper body clothing?: A Little Help from another person to put on and taking off regular lower body clothing?: A Lot 6 Click Score: 16    End of Session Equipment Utilized During Treatment: Rolling walker;Oxygen  OT Visit Diagnosis: Unsteadiness on feet (R26.81);Other abnormalities of gait and mobility (R26.89);History of falling (Z91.81);Muscle weakness (generalized) (M62.81);Pain   Activity Tolerance Patient tolerated treatment well   Patient Left in chair;with call bell/phone within reach;with chair alarm set   Nurse Communication Mobility status        Time: 3845-3646 OT Time Calculation (min): 25 min  Charges: OT General Charges $OT Visit: 1 Visit OT Treatments $Self Care/Home Management : 8-22 mins $Therapeutic Activity: 8-22 mins  Jackquline Denmark, OTS Acute Rehab Office: [phone]   Jefrey Raburn 03/12/2021, 1:02 PM

## 2021-03-12 NOTE — Care Management Important Message (Signed)
Important Message  Patient Details IM Letter given to the Patient. Name: Michaela Baker MRN: [id number] Date of Birth: 1950/11/24   Medicare Important Message Given:  Yes     [name] 03/12/2021, 11:30 AM

## 2021-03-12 NOTE — Progress Notes (Signed)
Nutrition Follow-up  INTERVENTION:   -Double protein portions with meals  -Multivitamin with minerals daily  NUTRITION DIAGNOSIS:   Increased nutrient needs related to wound healing as evidenced by estimated needs.  Ongoing.  GOAL:   Patient will meet greater than or equal to 90% of their needs  Progressing.  MONITOR:   PO intake, Supplement acceptance, Diet advancement, Labs, Weight trends, Skin, I & O's  ASSESSMENT:   70 yo female with a PMH of unspecified CHF, HTN, PVCs, T2DM, hypothyroidism, and OSA who presents with AKI and cellulitis of RLE.  Patient currently consuming 25-100% of meals. Will continue current interventions. Per chart review, plan is for SNF at discharge.  Admission weight: 179 lbs.   Medications: Lasix, Multivitamin with minerals daily, Thiamine   Labs reviewed: CBGs: 81-121  Diet Order:   Diet Order             DIET DYS 3 Room service appropriate? Yes; Fluid consistency: Thin  Diet effective now                   EDUCATION NEEDS:   Education needs have been addressed  Skin:  Skin Assessment: Skin Integrity Issues: Skin Integrity Issues:: Diabetic Ulcer Diabetic Ulcer: R heel  Last BM:  10/9  Height:   Ht Readings from Last 1 Encounters:  03/03/21 5\' 5"  (1.651 m)    Weight:   Wt Readings from Last 1 Encounters:  03/03/21 81.5 kg    BMI:  Body mass index is 29.9 kg/m.  Estimated Nutritional Needs:   Kcal:  1850-2050  Protein:  95-110 grams  Fluid:  >1.9 L   Clayton Bibles, MS, RD, LDN Inpatient Clinical Dietitian Contact information available via Amion

## 2021-03-13 DIAGNOSIS — L03115 Cellulitis of right lower limb: Secondary | ICD-10-CM | POA: Diagnosis not present

## 2021-03-13 DIAGNOSIS — N179 Acute kidney failure, unspecified: Secondary | ICD-10-CM | POA: Diagnosis not present

## 2021-03-13 DIAGNOSIS — E039 Hypothyroidism, unspecified: Secondary | ICD-10-CM | POA: Diagnosis not present

## 2021-03-13 DIAGNOSIS — E871 Hypo-osmolality and hyponatremia: Secondary | ICD-10-CM | POA: Diagnosis not present

## 2021-03-13 LAB — CBC
HCT: 31.2 % — ABNORMAL LOW (ref 36.0–46.0)
Hemoglobin: 9.5 g/dL — ABNORMAL LOW (ref 12.0–15.0)
MCH: 25.3 pg — ABNORMAL LOW (ref 26.0–34.0)
MCHC: 30.4 g/dL (ref 30.0–36.0)
MCV: 83.2 fL (ref 80.0–100.0)
Platelets: 284 10*3/uL (ref 150–400)
RBC: 3.75 MIL/uL — ABNORMAL LOW (ref 3.87–5.11)
RDW: 16.5 % — ABNORMAL HIGH (ref 11.5–15.5)
WBC: 7.4 10*3/uL (ref 4.0–10.5)
nRBC: 0 % (ref 0.0–0.2)

## 2021-03-13 LAB — GLUCOSE, CAPILLARY
Glucose-Capillary: 91 mg/dL (ref 70–99)
Glucose-Capillary: 100 mg/dL — ABNORMAL HIGH (ref 70–99)
Glucose-Capillary: 107 mg/dL — ABNORMAL HIGH (ref 70–99)
Glucose-Capillary: 99 mg/dL (ref 70–99)

## 2021-03-13 LAB — BASIC METABOLIC PANEL
Anion gap: 8 (ref 5–15)
BUN: 17 mg/dL (ref 8–23)
CO2: 23 mmol/L (ref 22–32)
Calcium: 8.4 mg/dL — ABNORMAL LOW (ref 8.9–10.3)
Chloride: 106 mmol/L (ref 98–111)
Creatinine, Ser: 0.83 mg/dL (ref 0.44–1.00)
GFR, Estimated: >60 mL/min (ref >60)
Glucose, Bld: 88 mg/dL (ref 70–99)
Potassium: 3.3 mmol/L — ABNORMAL LOW (ref 3.5–5.1)
Sodium: 137 mmol/L (ref 135–145)

## 2021-03-13 MED ORDER — DOXYCYCLINE HYCLATE 100 MG PO TABS
100.0000 mg | ORAL_TABLET | Freq: Two times a day (BID) | ORAL | Status: DC
  Administered 2021-03-13 – 2021-03-16 (×6): 100 mg via ORAL
  Filled 2021-03-13 (×7): qty 1

## 2021-03-13 MED ORDER — AMOXICILLIN-POT CLAVULANATE 875-125 MG PO TABS
1.0000 | ORAL_TABLET | Freq: Two times a day (BID) | ORAL | Status: DC
  Administered 2021-03-13 – 2021-03-16 (×6): 1 via ORAL
  Filled 2021-03-13 (×7): qty 1

## 2021-03-13 NOTE — Progress Notes (Addendum)
PROGRESS NOTE    Michaela Baker  LPF:[id number] DOB: [birth year] DOA: 03/03/2021 PCP: [name], FNP   Brief Narrative: Michaela Baker is a 70 y.o. female with a history of unspecified CHF, hypertension, class II obesity, history of PVCs, type II DM, hypothyroidism, OSA. Patient presented after a fall with worsening right foot pain. She was found to have a right foot cellulitis and started on IV antibiotics..   Assessment & Plan:   Principal Problem:   AKI (acute kidney injury) ([place]) Active Problems:   HTN (hypertension)   OSA (obstructive sleep apnea)   Hypothyroidism   Class II obesity   Hyponatremia   Abnormal LFTs   Cellulitis of right lower extremity   Sepsis ([place])   AKI Baseline creatinine of about 0.9-1 from 4 years prior. Creatinine of 1.81 on admission. Secondary to dehydration and resultant prerenal azotemia. Patient started on IV fluids with improvement of creatinine and resolution of AKI  Right LE cellulitis LE venous duplex without evidence of VTE. Patient empirically started on Vancomycin/Ceftriaxone/Flagyl; Flagyl discontinued on HD #3. Transitioned to doxycycline and Augmentin after continued clinical improvement on IV antibiotics. Leukocytosis resolved. Elevated temperature on 10/12 with Tmax of 100.3 F. No recurrent fever. Blood cultures no growth to date. Right leg still significantly warm compared to left. -Continue Augmentin/doxycycline for another 4 days  RLL pneumonia Likely partially treated with antibiotics for above. No symptoms currently. Still with some hypoxia but weaning off oxygen. CBC obtained today with no associated leukocytosis. Recent temperature of 100.3 F on 10/12 -Augmentin as mentioned above  Acute respiratory failure with hypoxia Secondary to pneumonia.  Documented hypoxia down to 89%. -Wean to room air  Sepsis Not present on admission. Secondary to cellulitis. Blood cultures (10/6) with no growth. Antibiotics as mentioned  above.  Hyponatremia Resolved with hydration.  Acute metabolic encephalopathy Likely related to acute illness. Mostly resolved.  Hypothyroidism -Continue Synthroid 25 mcg daily  Primary hypertension -Continue Toprol XL and amlodipine   DVT prophylaxis: Lovenox Code Status:   Code Status: Full Code Family Communication: None at bedside. Disposition Plan: Discharge to SNF when bed is available and wean to room air   Consultants:  None  Procedures:  LE VENOUS DUPLEX (10/5) BILATERAL ABI (10/5) Summary:  Right: Resting right ankle-brachial index indicates noncompressible right  lower extremity arteries. The right toe-brachial index is normal.   TBI shows borderline small vessel disease.  Left: Resting left ankle-brachial index indicates noncompressible left  lower extremity arteries. The left toe-brachial index is normal.   Antimicrobials: Vancomycin IV Ceftriaxone IV Flagyl IV Augmentin PO Doxycycline PO    Subjective: Leg continues to feel better. No dyspnea, chest pain, fevers, cough.  Objective: Vitals:   03/13/21 0330 03/13/21 0455 03/13/21 0505 03/13/21 0541  BP:    121/63  Pulse:    75  Resp:    20  Temp:    99.2 F (37.3 C)  TempSrc:    Oral  SpO2: 93% 92% 93% 94%  Weight:      Height:        Intake/Output Summary (Last 24 hours) at 03/13/2021 0949 Last data filed at 03/12/2021 1922 Gross per 24 hour  Intake 590 ml  Output --  Net 590 ml    Filed Weights   03/03/21 1100 03/03/21 1726  Weight: 94.3 kg 81.5 kg    Examination:  General exam: Appears calm and comfortable Respiratory system: RLL rales. Respiratory effort normal. Cardiovascular system: S1 & S2 heard,  RRR. No murmurs, rubs, gallops or clicks. Gastrointestinal system: Abdomen is nondistended, soft and nontender. No organomegaly or masses felt. Normal bowel sounds heard. Central nervous system: Alert and oriented. No focal neurological deficits. Musculoskeletal: RLE edema  with mild tenderness, warmth and no erythema. No calf tenderness Skin: No cyanosis. No rashes Psychiatry: Judgement and insight appear normal. Mood & affect appropriate.    Data Reviewed: I have personally reviewed following labs and imaging studies  CBC Lab Results  Component Value Date   WBC 7.4 03/13/2021   RBC 3.75 (L) 03/13/2021   HGB 9.5 (L) 03/13/2021   HCT 31.2 (L) 03/13/2021   MCV 83.2 03/13/2021   MCH 25.3 (L) 03/13/2021   PLT 284 03/13/2021   MCHC 30.4 03/13/2021   RDW 16.5 (H) 03/13/2021   LYMPHSABS 1.1 03/06/2021   MONOABS 0.8 03/06/2021   EOSABS 0.0 03/06/2021   BASOSABS 0.1 16/02/9603     Last metabolic panel Lab Results  Component Value Date   NA 137 03/13/2021   K 3.3 (L) 03/13/2021   CL 106 03/13/2021   CO2 23 03/13/2021   BUN 17 03/13/2021   CREATININE 0.83 03/13/2021   GLUCOSE 88 03/13/2021   GFRNONAA >60 03/13/2021   GFRAA >60 03/29/2017   CALCIUM 8.4 (L) 03/13/2021   PHOS 3.6 07/25/2013   PROT 7.4 03/04/2021   ALBUMIN 2.1 (L) 03/04/2021   BILITOT 1.0 03/04/2021   ALKPHOS 38 03/04/2021   AST 45 (H) 03/04/2021   ALT 20 03/04/2021   ANIONGAP 8 03/13/2021    CBG (last 3)  Recent Labs    03/12/21 1707 03/12/21 2133 03/13/21 0743  GLUCAP 86 95 91      GFR: Estimated Creatinine Clearance: 65.6 mL/min (by C-G formula based on SCr of 0.83 mg/dL).  Coagulation Profile: No results for input(s): INR, PROTIME in the last 168 hours.   Recent Results (from the past 240 hour(s))  Resp Panel by RT-PCR (Flu A&B, Covid) Nasopharyngeal Swab     Status: None   Collection Time: 03/03/21  2:23 PM   Specimen: Nasopharyngeal Swab; Nasopharyngeal(NP) swabs in vial transport medium  Result Value Ref Range Status   SARS Coronavirus 2 by RT PCR NEGATIVE NEGATIVE Final    Comment: (NOTE) SARS-CoV-2 target nucleic acids are NOT DETECTED.  The SARS-CoV-2 RNA is generally detectable in upper respiratory specimens during the acute phase of infection.  The lowest concentration of SARS-CoV-2 viral copies this assay can detect is 138 copies/mL. A negative result does not preclude SARS-Cov-2 infection and should not be used as the sole basis for treatment or other patient management decisions. A negative result may occur with  improper specimen collection/handling, submission of specimen other than nasopharyngeal swab, presence of viral mutation(s) within the areas targeted by this assay, and inadequate number of viral copies(<138 copies/mL). A negative result must be combined with clinical observations, patient history, and epidemiological information. The expected result is Negative.  Fact Sheet for Patients:  EntrepreneurPulse.com.au  Fact Sheet for Healthcare Providers:  IncredibleEmployment.be  This test is no t yet approved or cleared by the [place] FDA and  has been authorized for detection and/or diagnosis of SARS-CoV-2 by FDA under an Emergency Use Authorization (EUA). This EUA will remain  in effect (meaning this test can be used) for the duration of the COVID-19 declaration under Section 564(b)(1) of the Act, 21 U.S.C.section 360bbb-3(b)(1), unless the authorization is terminated  or revoked sooner.       Influenza A by PCR NEGATIVE  NEGATIVE Final   Influenza B by PCR NEGATIVE NEGATIVE Final    Comment: (NOTE) The Xpert Xpress SARS-CoV-2/FLU/RSV plus assay is intended as an aid in the diagnosis of influenza from Nasopharyngeal swab specimens and should not be used as a sole basis for treatment. Nasal washings and aspirates are unacceptable for Xpert Xpress SARS-CoV-2/FLU/RSV testing.  Fact Sheet for Patients: EntrepreneurPulse.com.au  Fact Sheet for Healthcare Providers: IncredibleEmployment.be  This test is not yet approved or cleared by the [place] FDA and has been authorized for detection and/or diagnosis of SARS-CoV-2 by FDA  under an Emergency Use Authorization (EUA). This EUA will remain in effect (meaning this test can be used) for the duration of the COVID-19 declaration under Section 564(b)(1) of the Act, 21 U.S.C. section 360bbb-3(b)(1), unless the authorization is terminated or revoked.  Performed at [facility], [place] [address], [place], [place] [address]   Blood culture (routine single)     Status: None   Collection Time: 03/04/21  4:29 PM   Specimen: BLOOD  Result Value Ref Range Status   Specimen Description   Final    BLOOD RIGHT ANTECUBITAL Performed at [facility] [address] [place], [place] [address]    Special Requests   Final    BOTTLES DRAWN AEROBIC AND ANAEROBIC Blood Culture adequate volume Performed at [facility] [address], [place], [place] [address]    Culture   Final    NO GROWTH 5 DAYS Performed at [facility], [place] [address], [place], [place] [address]    Report Status 03/09/2021 FINAL  Final  Culture, blood (routine x 2)     Status: None (Preliminary result)   Collection Time: 03/10/21  7:14 PM   Specimen: BLOOD  Result Value Ref Range Status   Specimen Description   Final    BLOOD RIGHT ANTECUBITAL Performed at [facility] [address], [place], [place] [address]    Special Requests   Final    BOTTLES DRAWN AEROBIC ONLY Blood Culture adequate volume Performed at [facility] [address], [place], [place] [address]    Culture   Final    NO GROWTH 3 DAYS Performed at [facility], [place] [address] [place], [place] [address]    Report Status PENDING  Incomplete  Culture, blood (routine x 2)     Status: None (Preliminary result)   Collection Time: 03/10/21  7:14 PM   Specimen: BLOOD LEFT FOREARM  Result Value Ref Range Status   Specimen Description   Final    BLOOD LEFT FOREARM Performed at [facility] [address], [place], [place] [address]    Special Requests   Final    BOTTLES DRAWN AEROBIC ONLY Blood Culture adequate volume Performed at [facility] [address] [place], [place] [address]    Culture   Final    NO GROWTH 3 DAYS Performed at [facility], [place] [address] [place], [place] [address]    Report Status PENDING  Incomplete         Radiology Studies: DG CHEST PORT 1 VIEW  Result Date: 03/12/2021 CLINICAL DATA:  Hypoxia EXAM: PORTABLE CHEST 1 VIEW COMPARISON:  03/03/2021 FINDINGS: Midline trachea. Tortuous thoracic aorta. Normal heart size. Atherosclerosis in the transverse aorta. No pneumothorax. Right base opacity is suspicious for airspace disease and possible small volume pleural fluid, new. IMPRESSION: Right base opacity, suspicious for  airspace disease (pneumonia favored) and possible small right pleural effusion. Aortic Atherosclerosis (ICD10-I70.0). Electronically Signed   By: Abigail Miyamoto M.D.   On: 03/12/2021 15:04        Scheduled Meds:  amLODipine  5 mg Oral Daily   aspirin EC  81 mg Oral Daily   enoxaparin (LOVENOX) injection  40 mg Subcutaneous Q24H   furosemide  40 mg Oral Daily   levothyroxine  25 mcg Oral Daily   metoprolol succinate  12.5 mg Oral Daily   multivitamin with minerals  1 tablet Oral Q lunch   mupirocin ointment   Topical BID   thiamine  100 mg Oral Daily   Continuous Infusions:   LOS: 9 days     Cordelia Poche, MD [facility] 03/13/2021, 9:49 AM  If 7PM-7AM, please contact night-coverage [email or web address]

## 2021-03-13 NOTE — Progress Notes (Addendum)
0130 - Pt weaned off O2 at this time. SpO2 at rest = 94%. Continuous monitoring w/ low limit of 92 in place. Call bell in reach, will continue to monitor   0500 - pt SpO2 varied from 89% to 92% on room air. Pt asymptomatic and denies distress. O2 x 1L re-applied and current SpO2 = 93%-95%. Will continue to monitor

## 2021-03-13 NOTE — Progress Notes (Signed)
Patient's right heel wound had minimal drainage. Wound bed is red, cleansed as ordered.

## 2021-03-14 DIAGNOSIS — N179 Acute kidney failure, unspecified: Secondary | ICD-10-CM | POA: Diagnosis not present

## 2021-03-14 DIAGNOSIS — E039 Hypothyroidism, unspecified: Secondary | ICD-10-CM | POA: Diagnosis not present

## 2021-03-14 DIAGNOSIS — E871 Hypo-osmolality and hyponatremia: Secondary | ICD-10-CM | POA: Diagnosis not present

## 2021-03-14 DIAGNOSIS — L03115 Cellulitis of right lower limb: Secondary | ICD-10-CM | POA: Diagnosis not present

## 2021-03-14 LAB — CBC
HCT: 31.6 % — ABNORMAL LOW (ref 36.0–46.0)
Hemoglobin: 9.6 g/dL — ABNORMAL LOW (ref 12.0–15.0)
MCH: 25.7 pg — ABNORMAL LOW (ref 26.0–34.0)
MCHC: 30.4 g/dL (ref 30.0–36.0)
MCV: 84.5 fL (ref 80.0–100.0)
Platelets: 313 10*3/uL (ref 150–400)
RBC: 3.74 MIL/uL — ABNORMAL LOW (ref 3.87–5.11)
RDW: 16.7 % — ABNORMAL HIGH (ref 11.5–15.5)
WBC: 6.9 10*3/uL (ref 4.0–10.5)
nRBC: 0 % (ref 0.0–0.2)

## 2021-03-14 LAB — GLUCOSE, CAPILLARY
Glucose-Capillary: 84 mg/dL (ref 70–99)
Glucose-Capillary: 120 mg/dL — ABNORMAL HIGH (ref 70–99)
Glucose-Capillary: 100 mg/dL — ABNORMAL HIGH (ref 70–99)
Glucose-Capillary: 99 mg/dL (ref 70–99)

## 2021-03-14 NOTE — Plan of Care (Signed)
  Problem: Education: Goal: Knowledge of General Education information will improve Description: Including pain rating scale, medication(s)/side effects and non-pharmacologic comfort measures Outcome: Progressing   Problem: Clinical Measurements: Goal: Ability to maintain clinical measurements within normal limits will improve Outcome: Progressing Goal: Will remain free from infection Outcome: Progressing Goal: Respiratory complications will improve Outcome: Progressing Goal: Cardiovascular complication will be avoided Outcome: Progressing   Problem: Activity: Goal: Risk for activity intolerance will decrease Outcome: Progressing   Problem: Nutrition: Goal: Adequate nutrition will be maintained Outcome: Progressing   Problem: Pain Managment: Goal: General experience of comfort will improve Outcome: Progressing   Problem: Safety: Goal: Ability to remain free from injury will improve Outcome: Progressing   Problem: Skin Integrity: Goal: Risk for impaired skin integrity will decrease Outcome: Progressing

## 2021-03-14 NOTE — Plan of Care (Signed)

## 2021-03-14 NOTE — Progress Notes (Signed)
Patients oxygen saturation 97% on RA at rest.  Patients oxygen saturation 94% on RA with ambulation.

## 2021-03-14 NOTE — Progress Notes (Signed)
PROGRESS NOTE    MICHILLE MCELRATH  EPP:[id number] DOB: 05/20/1951 DOA: 03/03/2021 PCP: [name], FNP   Brief Narrative: SANTRESA LEVETT is a 70 y.o. female with a history of unspecified CHF, hypertension, class II obesity, history of PVCs, type II DM, hypothyroidism, OSA. Patient presented after a fall with worsening right foot pain. She was found to have a right foot cellulitis and started on IV antibiotics..   Assessment & Plan:   Principal Problem:   AKI (acute kidney injury) ([place]) Active Problems:   HTN (hypertension)   OSA (obstructive sleep apnea)   Hypothyroidism   Class II obesity   Hyponatremia   Abnormal LFTs   Cellulitis of right lower extremity   Sepsis ([place])   AKI Baseline creatinine of about 0.9-1 from 4 years prior. Creatinine of 1.81 on admission. Secondary to dehydration and resultant prerenal azotemia. Patient started on IV fluids with improvement of creatinine and resolution of AKI.  Right LE cellulitis LE venous duplex without evidence of VTE. Patient empirically started on Vancomycin/Ceftriaxone/Flagyl; Flagyl discontinued on HD #3. Transitioned to doxycycline and Augmentin after continued clinical improvement on IV antibiotics. Leukocytosis resolved. Elevated temperature on 10/12 with Tmax of 100.3 F. No recurrent fever. Blood cultures no growth to date. Right leg seems significantly improved from yesterday -Resumed Augmentin/doxycycline on 10/15 for another 4 days  RLL pneumonia Likely partially treated with antibiotics for above. No symptoms currently. Still with some hypoxia but weaning off oxygen. CBC obtained today with no associated leukocytosis. Recent temperature of 100.3 F on 10/12 -Augmentin as mentioned above  Acute respiratory failure with hypoxia Secondary to pneumonia.  Documented hypoxia down to 89%. -Wean to room air  Sepsis Not present on admission. Secondary to cellulitis. Blood cultures (10/6) with no growth. Antibiotics as  mentioned above.  Hyponatremia Resolved with hydration.  Acute metabolic encephalopathy Likely related to acute illness. Mostly resolved.  Hypothyroidism -Continue Synthroid 25 mcg daily  Primary hypertension -Continue Toprol XL and amlodipine   DVT prophylaxis: Lovenox Code Status:   Code Status: Full Code Family Communication: None at bedside. Disposition Plan: Discharge to SNF vs [facility] with [facility] pending PT reevaluation, wean oxygen to room air. Likely discharge in 24 hours   Consultants:  None  Procedures:  LE VENOUS DUPLEX (10/5) BILATERAL ABI (10/5) Summary:  Right: Resting right ankle-brachial index indicates noncompressible right  lower extremity arteries. The right toe-brachial index is normal.   TBI shows borderline small vessel disease.  Left: Resting left ankle-brachial index indicates noncompressible left  lower extremity arteries. The left toe-brachial index is normal.   Antimicrobials: Vancomycin IV Ceftriaxone IV Flagyl IV Augmentin PO Doxycycline PO    Subjective: Feeling very good today. No dyspnea or chest pain. Right leg feels much better.  Objective: Vitals:   03/13/21 1344 03/13/21 2103 03/14/21 0649 03/14/21 0741  BP: 117/62 (!) 132/40 121/60 135/74  Pulse: 73 78 75 75  Resp: 18 18 20 18   Temp: 98.2 F (36.8 C) 99.3 F (37.4 C) 99.3 F (37.4 C) 99.2 F (37.3 C)  TempSrc:  Oral  Oral  SpO2: 96% 93% 95% 92%  Weight:      Height:        Intake/Output Summary (Last 24 hours) at 03/14/2021 1219 Last data filed at 03/14/2021 0939 Gross per 24 hour  Intake 1080 ml  Output --  Net 1080 ml    Filed Weights   03/03/21 1100 03/03/21 1726  Weight: 94.3 kg 81.5 kg  Examination:  General exam: Appears calm and comfortable Respiratory system: Right rales, diminished, otherwise clear. Respiratory effort normal. Cardiovascular system: S1 & S2 heard, RRR. No murmurs, rubs, gallops or clicks. Gastrointestinal system: Abdomen is  nondistended, soft and nontender. No organomegaly or masses felt. Normal bowel sounds heard. Central nervous system: Alert and oriented. No focal neurological deficits. Musculoskeletal: RLE edema with mild warmth. No calf tenderness Skin: No cyanosis. No rashes Psychiatry: Judgement and insight appear normal. Mood & affect appropriate.    Data Reviewed: I have personally reviewed following labs and imaging studies  CBC Lab Results  Component Value Date   WBC 7.4 03/13/2021   RBC 3.75 (L) 03/13/2021   HGB 9.5 (L) 03/13/2021   HCT 31.2 (L) 03/13/2021   MCV 83.2 03/13/2021   MCH 25.3 (L) 03/13/2021   PLT 284 03/13/2021   MCHC 30.4 03/13/2021   RDW 16.5 (H) 03/13/2021   LYMPHSABS 1.1 03/06/2021   MONOABS 0.8 03/06/2021   EOSABS 0.0 03/06/2021   BASOSABS 0.1 41/66/0630     Last metabolic panel Lab Results  Component Value Date   NA 137 03/13/2021   K 3.3 (L) 03/13/2021   CL 106 03/13/2021   CO2 23 03/13/2021   BUN 17 03/13/2021   CREATININE 0.83 03/13/2021   GLUCOSE 88 03/13/2021   GFRNONAA >60 03/13/2021   GFRAA >60 03/29/2017   CALCIUM 8.4 (L) 03/13/2021   PHOS 3.6 07/25/2013   PROT 7.4 03/04/2021   ALBUMIN 2.1 (L) 03/04/2021   BILITOT 1.0 03/04/2021   ALKPHOS 38 03/04/2021   AST 45 (H) 03/04/2021   ALT 20 03/04/2021   ANIONGAP 8 03/13/2021    CBG (last 3)  Recent Labs    03/13/21 2301 03/14/21 0749 03/14/21 1136  GLUCAP 99 84 120*      GFR: Estimated Creatinine Clearance: 65.6 mL/min (by C-G formula based on SCr of 0.83 mg/dL).  Coagulation Profile: No results for input(s): INR, PROTIME in the last 168 hours.   Recent Results (from the past 240 hour(s))  Blood culture (routine single)     Status: None   Collection Time: 03/04/21  4:29 PM   Specimen: BLOOD  Result Value Ref Range Status   Specimen Description   Final    BLOOD RIGHT ANTECUBITAL Performed at [facility] [address], [place], [place] [address]    Special  Requests   Final    BOTTLES DRAWN AEROBIC AND ANAEROBIC Blood Culture adequate volume Performed at [facility] [address] [place], [place] [address]    Culture   Final    NO GROWTH 5 DAYS Performed at [facility], [place] [address], [place], [place] [address]    Report Status 03/09/2021 FINAL  Final  Culture, blood (routine x 2)     Status: None (Preliminary result)   Collection Time: 03/10/21  7:14 PM   Specimen: BLOOD  Result Value Ref Range Status   Specimen Description   Final    BLOOD RIGHT ANTECUBITAL Performed at [facility] [address], [place], [place] [address]    Special Requests   Final    BOTTLES DRAWN AEROBIC ONLY Blood Culture adequate volume Performed at [facility] [address] [place], [place] [address]    Culture   Final    NO GROWTH 4 DAYS Performed at [facility], [place] [address] [place], [place] [address]    Report Status PENDING  Incomplete  Culture,  blood (routine x 2)     Status: None (Preliminary result)   Collection Time: 03/10/21  7:14 PM   Specimen: BLOOD LEFT FOREARM  Result Value Ref Range Status   Specimen Description   Final    BLOOD LEFT FOREARM Performed at [facility] [address] [place], [place] [address]    Special Requests   Final    BOTTLES DRAWN AEROBIC ONLY Blood Culture adequate volume Performed at [facility] [address] [place], [place] [address]    Culture   Final    NO GROWTH 4 DAYS Performed at [facility], [place] [address], [place], [place] [address]    Report Status PENDING  Incomplete         Radiology Studies: No results found.      Scheduled Meds:  amLODipine  5 mg Oral Daily   amoxicillin-clavulanate  1 tablet Oral Q12H   aspirin EC  81 mg Oral Daily   doxycycline  100 mg Oral Q12H   enoxaparin (LOVENOX) injection  40 mg Subcutaneous Q24H   furosemide  40 mg Oral  Daily   levothyroxine  25 mcg Oral Daily   metoprolol succinate  12.5 mg Oral Daily   multivitamin with minerals  1 tablet Oral Q lunch   mupirocin ointment   Topical BID   thiamine  100 mg Oral Daily   Continuous Infusions:   LOS: 10 days     Cordelia Poche, MD [facility] 03/14/2021, 12:19 PM  If 7PM-7AM, please contact night-coverage [email or web address]

## 2021-03-15 DIAGNOSIS — N179 Acute kidney failure, unspecified: Secondary | ICD-10-CM | POA: Diagnosis not present

## 2021-03-15 LAB — GLUCOSE, CAPILLARY
Glucose-Capillary: 78 mg/dL (ref 70–99)
Glucose-Capillary: 90 mg/dL (ref 70–99)
Glucose-Capillary: 82 mg/dL (ref 70–99)

## 2021-03-15 LAB — CULTURE, BLOOD (ROUTINE X 2)
Culture: NO GROWTH
Culture: NO GROWTH
Special Requests: ADEQUATE
Special Requests: ADEQUATE

## 2021-03-15 MED ORDER — DOXYCYCLINE HYCLATE 100 MG PO TABS: 100.0000 mg | ORAL_TABLET | Freq: Two times a day (BID) | ORAL | 0 refills | Status: AC

## 2021-03-15 MED ORDER — AMOXICILLIN-POT CLAVULANATE 875-125 MG PO TABS: 1.0000 | ORAL_TABLET | Freq: Two times a day (BID) | ORAL | 0 refills | Status: AC

## 2021-03-15 NOTE — Discharge Summary (Signed)
Physician Discharge Summary  Michaela Baker VVO:[id number] DOB: [birth year] DOA: 03/03/2021  PCP: [name], FNP  Admit date: 03/03/2021 Discharge date: 03/15/2021  Admitted From: Home Disposition: Home  Recommendations for Outpatient Follow-up:  Follow up with PCP in 1 week Please obtain BMP/CBC in one week Please follow up on the following pending results: None  Home Health: PT, aide Equipment/Devices: Oxygen  Discharge Condition: Stable CODE STATUS: Full code Diet recommendation: Regular diet   Brief/Interim Summary:  Admission HPI written by Tawni Millers, MD   HPI: Michaela Baker is a 70 y.o. female with medical history significant of unspecified CHF, hypertension, class II obesity, history of PVCs, type II DM, hypothyroidism, OSA who is coming to the emergency department due to having a fall at home and staying on the floor for about 3 hours unable to stand.  She stated that she has felt weak and unwell for the last few days.  Not sure if she has had a fever or any other symptoms.  Her granddaughter stated that her right foot wound has been there for over a year and she has been having wound care with a provider on a regular basis but did not know further details.  The patient is able to answer simple questions, deny headache, back, chest or abdominal pain at this time.  She stated that her foot discomfort is currently under control.   Hospital course:  AKI Baseline creatinine of about 0.9-1 from 4 years prior. Creatinine of 1.81 on admission. Secondary to dehydration and resultant prerenal azotemia. Patient started on IV fluids with improvement of creatinine and resolution of AKI.   Right LE cellulitis LE venous duplex without evidence of VTE. Patient empirically started on Vancomycin/Ceftriaxone/Flagyl; Flagyl discontinued on HD #3. Transitioned to doxycycline and Augmentin after continued clinical improvement on IV antibiotics. Leukocytosis resolved.  Elevated temperature on 10/12 with Tmax of 100.3 F. No recurrent fever. Blood cultures no growth to date. Right leg seems significantly improved. Discharged on Augmentin/doxycycline on discharge.   RLL pneumonia Likely partially treated with antibiotics for above. No symptoms currently. Still with some hypoxia but weaning off oxygen. CBC obtained today with no associated leukocytosis. Recent temperature of 100.3 F on 10/12. No recurrent fevers. Symptoms improved. Antibiotics as mentioned above. Repeat chest x-ray in 3-4 weeks.   Acute respiratory failure with hypoxia Secondary to pneumonia.  Documented hypoxia down to 89%. Unable to wean oxygen to room air. Discharged with oxygen 3 L/min while ambulating. Wean to room air as an outpatient.   Sepsis Not present on admission. Secondary to cellulitis. Blood cultures (10/6) with no growth. Antibiotics as mentioned above.   Hyponatremia Resolved with hydration.   Acute metabolic encephalopathy Likely related to acute illness. Mostly resolved.   Hypothyroidism Continue Synthroid 25 mcg daily   Primary hypertension Continue Toprol XL and amlodipine   Discharge Diagnoses:  Principal Problem:   AKI (acute kidney injury) ([place]) Active Problems:   HTN (hypertension)   OSA (obstructive sleep apnea)   Hypothyroidism   Class II obesity   Hyponatremia   Abnormal LFTs   Cellulitis of right lower extremity   Sepsis [facility])    Discharge Instructions  Discharge Instructions     Diet - low sodium heart healthy   Complete by: As directed    No wound care   Complete by: As directed       Allergies as of 03/15/2021   No Known Allergies      Medication  List     STOP taking these medications    hydrALAZINE 50 MG tablet Commonly known as: APRESOLINE   lisinopril 20 MG tablet Commonly known as: ZESTRIL   naproxen sodium 220 MG tablet Commonly known as: ALEVE       TAKE these medications    amLODipine 10 MG  tablet Commonly known as: NORVASC Take 10 mg by mouth daily.   amoxicillin-clavulanate 875-125 MG tablet Commonly known as: AUGMENTIN Take 1 tablet by mouth every 12 (twelve) hours for 2 days.   aspirin 81 MG EC tablet Take 1 tablet (81 mg total) by mouth daily.   dapagliflozin propanediol 10 MG Tabs tablet Commonly known as: FARXIGA Take 10 mg by mouth daily.   doxycycline 100 MG tablet Commonly known as: VIBRA-TABS Take 1 tablet (100 mg total) by mouth every 12 (twelve) hours for 2 days.   Fish Oil 1000 MG Caps Take 1,000 mg by mouth daily.   furosemide 40 MG tablet Commonly known as: LASIX Take 40 mg by mouth daily.   levothyroxine 25 MCG tablet Commonly known as: SYNTHROID Take 25 mcg by mouth daily.   metoprolol succinate 50 MG 24 hr tablet Commonly known as: TOPROL-XL Take 50 mg by mouth daily. Take with or immediately following a meal.   multivitamin with minerals tablet Take 1 tablet by mouth daily.   potassium chloride 10 MEQ tablet Commonly known as: KLOR-CON Take 20 mEq by mouth daily.   thiamine 100 MG tablet Take 100 mg by mouth daily.        Follow-up Information     [name], FNP Follow up.   Specialty: Nurse Practitioner Contact information: [place] [address] [phone]                No Known Allergies  Consultations: None   Procedures/Studies: DG Chest 2 View  Result Date: 03/03/2021 CLINICAL DATA:  70 year old female status post fall, found down. Pain. EXAM: CHEST - 2 VIEW COMPARISON:  Chest radiographs 03/28/2017 and earlier. FINDINGS: Semi upright AP and lateral views of the chest. Mildly rotated to the right. Lung volumes and mediastinal contours are stable. Calcified aortic atherosclerosis. Visualized tracheal air column is within normal limits. Chronic increased pulmonary interstitial markings. No pneumothorax, pulmonary edema, pleural effusion or confluent pulmonary opacity.  No acute osseous abnormality identified. Negative visible bowel gas pattern. IMPRESSION: No acute cardiopulmonary abnormality or acute traumatic injury identified. Electronically Signed   By: Genevie Ann M.D.   On: 03/03/2021 05:03   DG Tibia/Fibula Right  Result Date: 03/03/2021 CLINICAL DATA:  70 year old female status post fall, found down. Pain. EXAM: RIGHT TIBIA AND FIBULA - 2 VIEW COMPARISON:  Skeletal survey 08/29/2008. FINDINGS: Bone mineralization is within normal limits for age. Alignment appears preserved at the right knee and ankle. Tricompartmental degenerative changes partially visible at the knee. Right tibia and fibula appear intact. Generalized soft tissue swelling and stranding. Small calcified phleboliths as well as calcified peripheral vascular disease scattered in the soft tissues. No acute osseous abnormality identified. IMPRESSION: 1. No acute fracture or dislocation identified about the right tib-fib. 2. Chronic right knee degeneration. Calcified peripheral vascular disease. Electronically Signed   By: Genevie Ann M.D.   On: 03/03/2021 05:00   CT HEAD WO CONTRAST (5MM)  Result Date: 03/03/2021 CLINICAL DATA:  70 year old female status post fall, found down. Pain. EXAM: CT HEAD WITHOUT CONTRAST TECHNIQUE: Contiguous axial images were obtained from the base of the skull  through the vertex without intravenous contrast. COMPARISON:  None. FINDINGS: Brain: Small midline lipomas of the falx, normal variant. Cerebral volume is within normal limits for age. No midline shift, ventriculomegaly, mass effect, evidence of mass lesion, intracranial hemorrhage or evidence of cortically based acute infarction. Gray-white matter differentiation is within normal limits for age. No cortical encephalomalacia identified. Vascular: Calcified atherosclerosis at the skull base. No suspicious intracranial vascular hyperdensity. Skull: Negative.  No fracture identified. Sinuses/Orbits: Mild paranasal sinus mucosal  thickening. Tympanic cavities and mastoids are well aerated. Other: Postoperative changes to the left globe and mildly Disconjugate gaze. Mild right posterior convexity scalp hematoma or contusion on series 2, image 21. Underlying calvarium intact. No other discrete orbit or scalp soft tissue injury identified. IMPRESSION: 1. Mild right posterior convexity scalp soft tissue injury without underlying skull fracture. 2. Normal for age non contrast CT appearance of the brain. Electronically Signed   By: Genevie Ann M.D.   On: 03/03/2021 04:58   DG CHEST PORT 1 VIEW  Result Date: 03/12/2021 CLINICAL DATA:  Hypoxia EXAM: PORTABLE CHEST 1 VIEW COMPARISON:  03/03/2021 FINDINGS: Midline trachea. Tortuous thoracic aorta. Normal heart size. Atherosclerosis in the transverse aorta. No pneumothorax. Right base opacity is suspicious for airspace disease and possible small volume pleural fluid, new. IMPRESSION: Right base opacity, suspicious for airspace disease (pneumonia favored) and possible small right pleural effusion. Aortic Atherosclerosis (ICD10-I70.0). Electronically Signed   By: Abigail Miyamoto M.D.   On: 03/12/2021 15:04   DG Foot 2 Views Right  Result Date: 03/03/2021 CLINICAL DATA:  70 year old female status post fall, found down. Pain. EXAM: RIGHT FOOT - 2 VIEW COMPARISON:  Right foot series 10/30/2012. FINDINGS: Bone mineralization is within normal limits. Progressed degenerative spurring at the calcaneus and along the dorsal tarsal bones since 2014. But joint spaces and alignment are preserved. No fracture or dislocation identified. No acute osseous abnormality identified. Calcified peripheral vascular disease in the foot and ankle. No discrete soft tissue injury. IMPRESSION: 1. No acute fracture or dislocation identified about the right foot. 2. Calcified peripheral vascular disease. Electronically Signed   By: Genevie Ann M.D.   On: 03/03/2021 05:02   VAS [place] ABI WITH/WO TBI  Result Date: 03/03/2021  LOWER  EXTREMITY DOPPLER STUDY Patient Name:  KRYSTYL CANNELL  Date of Exam:   03/03/2021 Medical Rec #: [id number]      Accession #:    [id number] Date of Birth: 09-29-1950      Patient Gender: F Patient Age:   54 years Exam Location:  [facility] Procedure:      VAS [place] ABI WITH/WO TBI Referring Phys: DAVID ORTIZ --------------------------------------------------------------------------------  Indications: Cellulitis & calcifications seen on X-ray. High Risk Factors: Hypertension, Diabetes, no history of smoking. Other Factors: CHF.  Comparison Study: No previous exams Performing Technologist: Hill, Jody RVT, RDMS  Examination Guidelines: A complete evaluation includes at minimum, Doppler waveform signals and systolic blood pressure reading at the level of bilateral brachial, anterior tibial, and posterior tibial arteries, when vessel segments are accessible. Bilateral testing is considered an integral part of a complete examination. Photoelectric Plethysmograph (PPG) waveforms and toe systolic pressure readings are included as required and additional duplex testing as needed. Limited examinations for reoccurring indications may be performed as noted.  ABI Findings: +---------+------------------+-----+----------+----------+ Right    Rt Pressure (mmHg)IndexWaveform  Comment    +---------+------------------+-----+----------+----------+ Brachial 118  triphasic            +---------+------------------+-----+----------+----------+ PTA      155               1.31 monophasic           +---------+------------------+-----+----------+----------+ DP       131               1.11 monophasic           +---------+------------------+-----+----------+----------+ Great Toe82                0.69 Normal    borderline +---------+------------------+-----+----------+----------+ +---------+------------------+-----+---------+-------+ Left     Lt Pressure (mmHg)IndexWaveform Comment  +---------+------------------+-----+---------+-------+ Brachial                        triphasicIV      +---------+------------------+-----+---------+-------+ PTA                             biphasic >254    +---------+------------------+-----+---------+-------+ DP       160               1.36 biphasic         +---------+------------------+-----+---------+-------+ Great Toe101               0.86 Normal           +---------+------------------+-----+---------+-------+ +-------+-----------+-----------+------------+------------+ ABI/TBIToday's ABIToday's TBIPrevious ABIPrevious TBI +-------+-----------+-----------+------------+------------+ Right  1.31       0.69                                +-------+-----------+-----------+------------+------------+ Left   [place]         0.86                                +-------+-----------+-----------+------------+------------+  Arterial wall calcification precludes accurate ankle pressures and ABIs.  Summary: Right: Resting right ankle-brachial index indicates noncompressible right lower extremity arteries. The right toe-brachial index is normal. TBI shows borderline small vessel disease. Left: Resting left ankle-brachial index indicates noncompressible left lower extremity arteries. The left toe-brachial index is normal.  *See table(s) above for measurements and observations.  Electronically signed by Harold Barban MD on 03/03/2021 at 4:51:44 PM.    Final    DG Hip Unilat W or Wo Pelvis 2-3 Views Right  Result Date: 03/03/2021 CLINICAL DATA:  70 year old female status post fall, found down. Pain. EXAM: DG HIP (WITH OR WITHOUT PELVIS) 2-3V RIGHT COMPARISON:  Bone survey 08/29/2008. FINDINGS: Pelvis and AP view of the hip are oblique to the right. Femoral heads remain normally located. Background bone mineralization appears stable since 2010 and within normal limits. No pelvis fracture identified. SI joints appear symmetric. Grossly intact  proximal left femur. Proximal right femur appears intact. Partially visible chronic lumbar disc and endplate degeneration. IMPRESSION: No acute fracture or dislocation identified about the right hip or pelvis. Electronically Signed   By: Genevie Ann M.D.   On: 03/03/2021 04:55   VAS [place] LOWER EXTREMITY VENOUS (DVT) (ONLY [facility] & [facility])  Result Date: 03/03/2021  Lower Venous DVT Study Patient Name:  SHUNDA RABADI  Date of Exam:   03/03/2021 Medical Rec #: [id number]      Accession #:    [id number] Date of Birth: June 12, 1950      Patient  Gender: F Patient Age:   18 years Exam Location:  [facility] Procedure:      VAS [place] LOWER EXTREMITY VENOUS (DVT) Referring Phys: MICHAEL BERO --------------------------------------------------------------------------------  Indications: Pain, warmth, swelling, redness. Other Indications: Pain post fall on 03/02/21. Comparison Study: Previous exam 07/22/2013 negative for DVT. Performing Technologist: Rogelia Rohrer RVT, RDMS  Examination Guidelines: A complete evaluation includes B-mode imaging, spectral Doppler, color Doppler, and power Doppler as needed of all accessible portions of each vessel. Bilateral testing is considered an integral part of a complete examination. Limited examinations for reoccurring indications may be performed as noted. The reflux portion of the exam is performed with the patient in reverse Trendelenburg.  +---------+---------------+---------+-----------+----------+-------------------+ RIGHT    CompressibilityPhasicitySpontaneityPropertiesThrombus Aging      +---------+---------------+---------+-----------+----------+-------------------+ CFV      Full           Yes      Yes                                      +---------+---------------+---------+-----------+----------+-------------------+ SFJ      Full                                                             +---------+---------------+---------+-----------+----------+-------------------+ FV  Prox  Full           Yes      Yes                                      +---------+---------------+---------+-----------+----------+-------------------+ FV Mid   Full           Yes      Yes                                      +---------+---------------+---------+-----------+----------+-------------------+ FV DistalFull           Yes      Yes                                      +---------+---------------+---------+-----------+----------+-------------------+ PFV      Full                                                             +---------+---------------+---------+-----------+----------+-------------------+ POP      Full           Yes      Yes                                      +---------+---------------+---------+-----------+----------+-------------------+ PTV      Full                                                             +---------+---------------+---------+-----------+----------+-------------------+  PERO     Full                                         Not well visualized +---------+---------------+---------+-----------+----------+-------------------+ Lymph node measuring 5.2 x 4.9 x 1.3 cm in groin  +----+---------------+---------+-----------+----------+--------------+ LEFTCompressibilityPhasicitySpontaneityPropertiesThrombus Aging +----+---------------+---------+-----------+----------+--------------+ CFV Full           Yes      Yes                                 +----+---------------+---------+-----------+----------+--------------+    Summary: RIGHT: - There is no evidence of deep vein thrombosis in the lower extremity. - There is no evidence of superficial venous thrombosis.  - No cystic structure found in the popliteal fossa. - Ultrasound characteristics of enlarged lymph nodes are noted in the groin.  LEFT: - No evidence of common femoral vein obstruction. - Ultrasound characteristics of enlarged lymph nodes noted in the groin.  *See  table(s) above for measurements and observations. Electronically signed by Harold Barban MD on 03/03/2021 at 4:51:15 PM.    Final       Subjective: No issues today. Right leg feels better.  Discharge Exam: Vitals:   03/14/21 2021 03/15/21 0339  BP: 123/77 117/63  Pulse: 88 69  Resp:  16  Temp: 98.7 F (37.1 C) 98.4 F (36.9 C)  SpO2: 94% 100%   Vitals:   03/14/21 0741 03/14/21 1349 03/14/21 2021 03/15/21 0339  BP: 135/74 110/62 123/77 117/63  Pulse: 75 70 88 69  Resp: 18 20  16   Temp: 99.2 F (37.3 C) 97.6 F (36.4 C) 98.7 F (37.1 C) 98.4 F (36.9 C)  TempSrc: Oral  Oral Oral  SpO2: 92% 94% 94% 100%  Weight:      Height:        General: Pt is alert, awake, not in acute distress Cardiovascular: RRR, S1/S2 +, no rubs, no gallops Respiratory: Some mild basilar rales, otherwise no wheezing Abdominal: Soft, NT, ND, bowel sounds + Extremities: RLE edema with no erythema or tenderness    The results of significant diagnostics from this hospitalization (including imaging, microbiology, ancillary and laboratory) are listed below for reference.     Microbiology: Recent Results (from the past 240 hour(s))  Culture, blood (routine x 2)     Status: None   Collection Time: 03/10/21  7:14 PM   Specimen: BLOOD  Result Value Ref Range Status   Specimen Description   Final    BLOOD RIGHT ANTECUBITAL Performed at [facility] [address] [place], [place] [address]    Special Requests   Final    BOTTLES DRAWN AEROBIC ONLY Blood Culture adequate volume Performed at [facility] [address], [place], [place] [address]    Culture   Final    NO GROWTH 5 DAYS Performed at [facility], [place] [address] [place], [place] [address]    Report Status 03/15/2021 FINAL  Final  Culture, blood (routine x 2)     Status: None   Collection Time: 03/10/21  7:14 PM   Specimen: BLOOD LEFT FOREARM  Result Value Ref Range Status   Specimen  Description   Final    BLOOD LEFT FOREARM Performed at [facility] [address] [place], [place] [address]    Special Requests  Final    BOTTLES DRAWN AEROBIC ONLY Blood Culture adequate volume Performed at [facility] [address], [place], [place] [address]    Culture   Final    NO GROWTH 5 DAYS Performed at [facility], [place] [address], [place], [place] [address]    Report Status 03/15/2021 FINAL  Final     Labs: BNP (last 3 results) No results for input(s): BNP in the last 8760 hours. Basic Metabolic Panel: Recent Labs  Lab 03/09/21 0449 03/10/21 0502 03/13/21 0750  NA 138  --  137  K 3.5  --  3.3*  CL 108  --  106  CO2 23  --  23  GLUCOSE 92  --  88  BUN 23  --  17  CREATININE 0.88 0.79 0.83  CALCIUM 8.2*  --  8.4*  MG 1.8  --   --    Liver Function Tests: No results for input(s): AST, ALT, ALKPHOS, BILITOT, PROT, ALBUMIN in the last 168 hours. No results for input(s): LIPASE, AMYLASE in the last 168 hours. No results for input(s): AMMONIA in the last 168 hours. CBC: Recent Labs  Lab 03/09/21 0449 03/10/21 1914 03/11/21 1029 03/13/21 0750 03/14/21 1250  WBC 10.7* 11.3* 8.5 7.4 6.9  HGB 10.2* 10.0* 10.5* 9.5* 9.6*  HCT 33.6* 33.9* 34.3* 31.2* 31.6*  MCV 83.4 87.1 84.3 83.2 84.5  PLT 295 322 308 284 313   Cardiac Enzymes: No results for input(s): CKTOTAL, CKMB, CKMBINDEX, TROPONINI in the last 168 hours. BNP: Invalid input(s): POCBNP CBG: Recent Labs  Lab 03/14/21 0749 03/14/21 1136 03/14/21 1622 03/14/21 2035 03/15/21 0807  GLUCAP 84 120* 100* 99 78   D-Dimer No results for input(s): DDIMER in the last 72 hours. Hgb A1c No results for input(s): HGBA1C in the last 72 hours. Lipid Profile No results for input(s): CHOL, HDL, LDLCALC, TRIG, CHOLHDL, LDLDIRECT in the last 72 hours. Thyroid function studies No results for input(s): TSH, T4TOTAL, T3FREE, THYROIDAB in the last 72  hours.  Invalid input(s): FREET3 Anemia work up No results for input(s): VITAMINB12, FOLATE, FERRITIN, TIBC, IRON, RETICCTPCT in the last 72 hours. Urinalysis    Component Value Date/Time   COLORURINE AMBER (A) 03/05/2021 1409   APPEARANCEUR CLOUDY (A) 03/05/2021 1409   LABSPEC 1.028 03/05/2021 1409   PHURINE 5.0 03/05/2021 1409   GLUCOSEU NEGATIVE 03/05/2021 1409   HGBUR MODERATE (A) 03/05/2021 1409   BILIRUBINUR NEGATIVE 03/05/2021 1409   KETONESUR 5 (A) 03/05/2021 1409   PROTEINUR 100 (A) 03/05/2021 1409   UROBILINOGEN 0.2 07/14/2010 2041   NITRITE NEGATIVE 03/05/2021 1409   LEUKOCYTESUR TRACE (A) 03/05/2021 1409   Sepsis Labs Invalid input(s): PROCALCITONIN,  WBC,  LACTICIDVEN Microbiology Recent Results (from the past 240 hour(s))  Culture, blood (routine x 2)     Status: None   Collection Time: 03/10/21  7:14 PM   Specimen: BLOOD  Result Value Ref Range Status   Specimen Description   Final    BLOOD RIGHT ANTECUBITAL Performed at [facility], [place] [address], [place], [place] [address]    Special Requests   Final    BOTTLES DRAWN AEROBIC ONLY Blood Culture adequate volume Performed at [facility] [address] [place], [place] [address]    Culture   Final    NO GROWTH 5 DAYS Performed at [facility], [place] [address] [place], [place] [address]    Report Status 03/15/2021 FINAL  Final  Culture, blood (routine x 2)     Status: None   Collection Time: 03/10/21  7:14 PM   Specimen: BLOOD LEFT FOREARM  Result Value Ref Range Status   Specimen Description   Final    BLOOD LEFT FOREARM Performed at [facility] [address], [place], [place] [address]    Special Requests   Final    BOTTLES DRAWN AEROBIC ONLY Blood Culture adequate volume Performed at [facility] [address], [place], [place] [address]    Culture   Final    NO GROWTH 5 DAYS Performed at [facility], [place] [address] [place], [place] [address]    Report Status 03/15/2021 FINAL  Final     Time coordinating discharge: 35 minutes  SIGNED:   Cordelia Poche, MD [facility] 03/15/2021, 12:19 PM

## 2021-03-15 NOTE — Progress Notes (Signed)
SATURATION QUALIFICATIONS: (This note is used to comply with regulatory documentation for home oxygen)  Patient Saturations on Room Air at Rest = 93%  Patient Saturations on Room Air while Ambulating = 84%  Patient Saturations on 2Liters of oxygen while Ambulating = 88%  Please briefly explain why patient needs home oxygen: Patient will benefit from home oxygen, desats while ambulating.

## 2021-03-15 NOTE — TOC Transition Note (Signed)
Transition of Care [facility]) - CM/SW Discharge Note   Patient Details  Name: EMBERLYN BURLISON MRN: [id number] Date of Birth: 1950-10-25  Transition of Care [facility]) CM/SW Contact:  Trish Mage, LCSW Phone Number: 03/15/2021, 1:31 PM   Clinical Narrative:   Spoke with daughter this AM who confirmed that the plan was to take her mother home as she is doing much better.  She requested HH PT and aide services.  Also, patient is in need of home )2, according to SAT note. Orders seen and appreciated.  Cindie with [facility] accepted patient for [facility] services. Caryl Pina with [employer] will deliver home unit and travel cannister. No further needs identified. TOC sign off.    Final next level of care: [place] Barriers to Discharge: Barriers Resolved   Patient Goals and CMS Choice     Choice offered to / list presented to : Patient, Adult Children  Discharge Placement                       Discharge Plan and Services   Discharge Planning Services: CM Consult Post Acute Care Choice: [place]                               Social Determinants of Health (SDOH) Interventions     Readmission Risk Interventions No flowsheet data found.

## 2021-03-15 NOTE — Progress Notes (Signed)
Patient will be discharging home with sister later today. Belongings were returned. Education on medications will be provided.

## 2021-03-15 NOTE — Care Management Important Message (Signed)
Important Message  Patient Details IM Letter given to the Patient. Name: Michaela Baker MRN: [id number] Date of Birth: 1950-06-29   Medicare Important Message Given:  Yes     [name] 03/15/2021, 11:20 AM

## 2021-03-15 NOTE — Discharge Instructions (Signed)
Michaela Baker,  You were in the hospital with a right lower leg skin infection which has improved with antibiotics. You also had evidence of pneumonia which has improved as well. Please follow-up with your PCP. You have been discharged with some antibiotics to finish your treatment course.

## 2021-03-16 LAB — GLUCOSE, CAPILLARY: Glucose-Capillary: 79 mg/dL (ref 70–99)

## 2021-03-16 NOTE — Progress Notes (Signed)
Chart reviewed. Patient did not discharge last night secondary to transportation issues. Stable for discharge home.  Cordelia Poche, MD [facility]

## 2021-03-16 NOTE — TOC Transition Note (Signed)
Transition of Care [facility]) - CM/SW Discharge Note   Patient Details  Name: Michaela Baker MRN: [id number] Date of Birth: Nov 03, 1950  Transition of Care [facility]) CM/SW Contact:  Trish Mage, LCSW Phone Number: 03/16/2021, 12:06 PM   Clinical Narrative:   Despite O2 delivery at 12:30 yesterday, patient is still here today.  Apparently nursing staff was unaware of delivery.  Today, when O2 was tried, it was uncooperative and would release to atmosphere instead of into regulator.  Caryl Pina is sending a replacement.  Also, daughter is requesting bedside commode.  Message MD with request for order, contacted [facility] with request to ship to house. TOC sign off.    Final next level of care: [place] Barriers to Discharge: Barriers Resolved   Patient Goals and CMS Choice     Choice offered to / list presented to : Patient, Adult Children  Discharge Placement                       Discharge Plan and Services   Discharge Planning Services: CM Consult Post Acute Care Choice: [place]                               Social Determinants of Health (SDOH) Interventions     Readmission Risk Interventions No flowsheet data found.

## 2021-03-18 DIAGNOSIS — I071 Rheumatic tricuspid insufficiency: Secondary | ICD-10-CM | POA: Diagnosis not present

## 2021-03-18 DIAGNOSIS — I700 Atherosclerosis of aorta: Secondary | ICD-10-CM | POA: Diagnosis not present

## 2021-03-18 DIAGNOSIS — E860 Dehydration: Secondary | ICD-10-CM | POA: Diagnosis not present

## 2021-03-18 DIAGNOSIS — J9601 Acute respiratory failure with hypoxia: Secondary | ICD-10-CM | POA: Diagnosis not present

## 2021-03-18 DIAGNOSIS — A419 Sepsis, unspecified organism: Secondary | ICD-10-CM | POA: Diagnosis not present

## 2021-03-18 DIAGNOSIS — E039 Hypothyroidism, unspecified: Secondary | ICD-10-CM | POA: Diagnosis not present

## 2021-03-18 DIAGNOSIS — Z7982 Long term (current) use of aspirin: Secondary | ICD-10-CM | POA: Diagnosis not present

## 2021-03-18 DIAGNOSIS — Z9981 Dependence on supplemental oxygen: Secondary | ICD-10-CM | POA: Diagnosis not present

## 2021-03-18 DIAGNOSIS — M5136 Other intervertebral disc degeneration, lumbar region: Secondary | ICD-10-CM | POA: Diagnosis not present

## 2021-03-18 DIAGNOSIS — G4733 Obstructive sleep apnea (adult) (pediatric): Secondary | ICD-10-CM | POA: Diagnosis not present

## 2021-03-18 DIAGNOSIS — M1711 Unilateral primary osteoarthritis, right knee: Secondary | ICD-10-CM | POA: Diagnosis not present

## 2021-03-18 DIAGNOSIS — E1151 Type 2 diabetes mellitus with diabetic peripheral angiopathy without gangrene: Secondary | ICD-10-CM | POA: Diagnosis not present

## 2021-03-18 DIAGNOSIS — I493 Ventricular premature depolarization: Secondary | ICD-10-CM | POA: Diagnosis not present

## 2021-03-18 DIAGNOSIS — I110 Hypertensive heart disease with heart failure: Secondary | ICD-10-CM | POA: Diagnosis not present

## 2021-03-18 DIAGNOSIS — Z9181 History of falling: Secondary | ICD-10-CM | POA: Diagnosis not present

## 2021-03-18 DIAGNOSIS — Z792 Long term (current) use of antibiotics: Secondary | ICD-10-CM | POA: Diagnosis not present

## 2021-03-18 DIAGNOSIS — M7731 Calcaneal spur, right foot: Secondary | ICD-10-CM | POA: Diagnosis not present

## 2021-03-18 DIAGNOSIS — L03115 Cellulitis of right lower limb: Secondary | ICD-10-CM | POA: Diagnosis not present

## 2021-03-18 DIAGNOSIS — I272 Pulmonary hypertension, unspecified: Secondary | ICD-10-CM | POA: Diagnosis not present

## 2021-03-18 DIAGNOSIS — I509 Heart failure, unspecified: Secondary | ICD-10-CM | POA: Diagnosis not present

## 2021-03-22 DIAGNOSIS — L03115 Cellulitis of right lower limb: Secondary | ICD-10-CM | POA: Diagnosis not present

## 2021-03-22 DIAGNOSIS — M1711 Unilateral primary osteoarthritis, right knee: Secondary | ICD-10-CM | POA: Diagnosis not present

## 2021-03-22 DIAGNOSIS — Z7982 Long term (current) use of aspirin: Secondary | ICD-10-CM | POA: Diagnosis not present

## 2021-03-22 DIAGNOSIS — Z792 Long term (current) use of antibiotics: Secondary | ICD-10-CM | POA: Diagnosis not present

## 2021-03-22 DIAGNOSIS — I700 Atherosclerosis of aorta: Secondary | ICD-10-CM | POA: Diagnosis not present

## 2021-03-22 DIAGNOSIS — J9601 Acute respiratory failure with hypoxia: Secondary | ICD-10-CM | POA: Diagnosis not present

## 2021-03-22 DIAGNOSIS — E039 Hypothyroidism, unspecified: Secondary | ICD-10-CM | POA: Diagnosis not present

## 2021-03-22 DIAGNOSIS — I272 Pulmonary hypertension, unspecified: Secondary | ICD-10-CM | POA: Diagnosis not present

## 2021-03-22 DIAGNOSIS — Z9981 Dependence on supplemental oxygen: Secondary | ICD-10-CM | POA: Diagnosis not present

## 2021-03-22 DIAGNOSIS — G4733 Obstructive sleep apnea (adult) (pediatric): Secondary | ICD-10-CM | POA: Diagnosis not present

## 2021-03-22 DIAGNOSIS — I493 Ventricular premature depolarization: Secondary | ICD-10-CM | POA: Diagnosis not present

## 2021-03-22 DIAGNOSIS — A419 Sepsis, unspecified organism: Secondary | ICD-10-CM | POA: Diagnosis not present

## 2021-03-22 DIAGNOSIS — Z9181 History of falling: Secondary | ICD-10-CM | POA: Diagnosis not present

## 2021-03-22 DIAGNOSIS — E860 Dehydration: Secondary | ICD-10-CM | POA: Diagnosis not present

## 2021-03-22 DIAGNOSIS — M5136 Other intervertebral disc degeneration, lumbar region: Secondary | ICD-10-CM | POA: Diagnosis not present

## 2021-03-22 DIAGNOSIS — I509 Heart failure, unspecified: Secondary | ICD-10-CM | POA: Diagnosis not present

## 2021-03-22 DIAGNOSIS — E1151 Type 2 diabetes mellitus with diabetic peripheral angiopathy without gangrene: Secondary | ICD-10-CM | POA: Diagnosis not present

## 2021-03-22 DIAGNOSIS — I110 Hypertensive heart disease with heart failure: Secondary | ICD-10-CM | POA: Diagnosis not present

## 2021-03-22 DIAGNOSIS — I071 Rheumatic tricuspid insufficiency: Secondary | ICD-10-CM | POA: Diagnosis not present

## 2021-03-22 DIAGNOSIS — M7731 Calcaneal spur, right foot: Secondary | ICD-10-CM | POA: Diagnosis not present

## 2021-03-25 DIAGNOSIS — J9601 Acute respiratory failure with hypoxia: Secondary | ICD-10-CM | POA: Diagnosis not present

## 2021-03-25 DIAGNOSIS — I110 Hypertensive heart disease with heart failure: Secondary | ICD-10-CM | POA: Diagnosis not present

## 2021-03-25 DIAGNOSIS — I272 Pulmonary hypertension, unspecified: Secondary | ICD-10-CM | POA: Diagnosis not present

## 2021-03-25 DIAGNOSIS — I493 Ventricular premature depolarization: Secondary | ICD-10-CM | POA: Diagnosis not present

## 2021-03-25 DIAGNOSIS — Z9981 Dependence on supplemental oxygen: Secondary | ICD-10-CM | POA: Diagnosis not present

## 2021-03-25 DIAGNOSIS — Z9181 History of falling: Secondary | ICD-10-CM | POA: Diagnosis not present

## 2021-03-25 DIAGNOSIS — I509 Heart failure, unspecified: Secondary | ICD-10-CM | POA: Diagnosis not present

## 2021-03-25 DIAGNOSIS — I071 Rheumatic tricuspid insufficiency: Secondary | ICD-10-CM | POA: Diagnosis not present

## 2021-03-25 DIAGNOSIS — G4733 Obstructive sleep apnea (adult) (pediatric): Secondary | ICD-10-CM | POA: Diagnosis not present

## 2021-03-25 DIAGNOSIS — M7731 Calcaneal spur, right foot: Secondary | ICD-10-CM | POA: Diagnosis not present

## 2021-03-25 DIAGNOSIS — Z792 Long term (current) use of antibiotics: Secondary | ICD-10-CM | POA: Diagnosis not present

## 2021-03-25 DIAGNOSIS — E860 Dehydration: Secondary | ICD-10-CM | POA: Diagnosis not present

## 2021-03-25 DIAGNOSIS — A419 Sepsis, unspecified organism: Secondary | ICD-10-CM | POA: Diagnosis not present

## 2021-03-25 DIAGNOSIS — E039 Hypothyroidism, unspecified: Secondary | ICD-10-CM | POA: Diagnosis not present

## 2021-03-25 DIAGNOSIS — E1151 Type 2 diabetes mellitus with diabetic peripheral angiopathy without gangrene: Secondary | ICD-10-CM | POA: Diagnosis not present

## 2021-03-25 DIAGNOSIS — Z7982 Long term (current) use of aspirin: Secondary | ICD-10-CM | POA: Diagnosis not present

## 2021-03-25 DIAGNOSIS — M1711 Unilateral primary osteoarthritis, right knee: Secondary | ICD-10-CM | POA: Diagnosis not present

## 2021-03-25 DIAGNOSIS — M5136 Other intervertebral disc degeneration, lumbar region: Secondary | ICD-10-CM | POA: Diagnosis not present

## 2021-03-25 DIAGNOSIS — L03115 Cellulitis of right lower limb: Secondary | ICD-10-CM | POA: Diagnosis not present

## 2021-03-25 DIAGNOSIS — I700 Atherosclerosis of aorta: Secondary | ICD-10-CM | POA: Diagnosis not present

## 2021-03-30 DIAGNOSIS — Z131 Encounter for screening for diabetes mellitus: Secondary | ICD-10-CM | POA: Diagnosis not present

## 2021-03-30 DIAGNOSIS — G4733 Obstructive sleep apnea (adult) (pediatric): Secondary | ICD-10-CM | POA: Diagnosis not present

## 2021-03-30 DIAGNOSIS — I493 Ventricular premature depolarization: Secondary | ICD-10-CM | POA: Diagnosis not present

## 2021-03-30 DIAGNOSIS — E860 Dehydration: Secondary | ICD-10-CM | POA: Diagnosis not present

## 2021-03-30 DIAGNOSIS — Z9981 Dependence on supplemental oxygen: Secondary | ICD-10-CM | POA: Diagnosis not present

## 2021-03-30 DIAGNOSIS — E1151 Type 2 diabetes mellitus with diabetic peripheral angiopathy without gangrene: Secondary | ICD-10-CM | POA: Diagnosis not present

## 2021-03-30 DIAGNOSIS — I071 Rheumatic tricuspid insufficiency: Secondary | ICD-10-CM | POA: Diagnosis not present

## 2021-03-30 DIAGNOSIS — J9601 Acute respiratory failure with hypoxia: Secondary | ICD-10-CM | POA: Diagnosis not present

## 2021-03-30 DIAGNOSIS — Z792 Long term (current) use of antibiotics: Secondary | ICD-10-CM | POA: Diagnosis not present

## 2021-03-30 DIAGNOSIS — I272 Pulmonary hypertension, unspecified: Secondary | ICD-10-CM | POA: Diagnosis not present

## 2021-03-30 DIAGNOSIS — M7731 Calcaneal spur, right foot: Secondary | ICD-10-CM | POA: Diagnosis not present

## 2021-03-30 DIAGNOSIS — E039 Hypothyroidism, unspecified: Secondary | ICD-10-CM | POA: Diagnosis not present

## 2021-03-30 DIAGNOSIS — I700 Atherosclerosis of aorta: Secondary | ICD-10-CM | POA: Diagnosis not present

## 2021-03-30 DIAGNOSIS — I509 Heart failure, unspecified: Secondary | ICD-10-CM | POA: Diagnosis not present

## 2021-03-30 DIAGNOSIS — L03115 Cellulitis of right lower limb: Secondary | ICD-10-CM | POA: Diagnosis not present

## 2021-03-30 DIAGNOSIS — E119 Type 2 diabetes mellitus without complications: Secondary | ICD-10-CM | POA: Diagnosis not present

## 2021-03-30 DIAGNOSIS — I10 Essential (primary) hypertension: Secondary | ICD-10-CM | POA: Diagnosis not present

## 2021-03-30 DIAGNOSIS — Z758 Other problems related to medical facilities and other health care: Secondary | ICD-10-CM | POA: Diagnosis not present

## 2021-03-30 DIAGNOSIS — I110 Hypertensive heart disease with heart failure: Secondary | ICD-10-CM | POA: Diagnosis not present

## 2021-03-30 DIAGNOSIS — M5136 Other intervertebral disc degeneration, lumbar region: Secondary | ICD-10-CM | POA: Diagnosis not present

## 2021-03-30 DIAGNOSIS — N179 Acute kidney failure, unspecified: Secondary | ICD-10-CM | POA: Diagnosis not present

## 2021-03-30 DIAGNOSIS — M1711 Unilateral primary osteoarthritis, right knee: Secondary | ICD-10-CM | POA: Diagnosis not present

## 2021-03-30 DIAGNOSIS — Z9229 Personal history of other drug therapy: Secondary | ICD-10-CM | POA: Diagnosis not present

## 2021-03-30 DIAGNOSIS — A419 Sepsis, unspecified organism: Secondary | ICD-10-CM | POA: Diagnosis not present

## 2021-03-30 DIAGNOSIS — Z7982 Long term (current) use of aspirin: Secondary | ICD-10-CM | POA: Diagnosis not present

## 2021-03-30 DIAGNOSIS — Z23 Encounter for immunization: Secondary | ICD-10-CM | POA: Diagnosis not present

## 2021-03-30 DIAGNOSIS — Z136 Encounter for screening for cardiovascular disorders: Secondary | ICD-10-CM | POA: Diagnosis not present

## 2021-03-30 DIAGNOSIS — Z9181 History of falling: Secondary | ICD-10-CM | POA: Diagnosis not present

## 2021-04-01 DIAGNOSIS — L03115 Cellulitis of right lower limb: Secondary | ICD-10-CM | POA: Diagnosis not present

## 2021-04-01 DIAGNOSIS — G4733 Obstructive sleep apnea (adult) (pediatric): Secondary | ICD-10-CM | POA: Diagnosis not present

## 2021-04-01 DIAGNOSIS — I509 Heart failure, unspecified: Secondary | ICD-10-CM | POA: Diagnosis not present

## 2021-04-01 DIAGNOSIS — I110 Hypertensive heart disease with heart failure: Secondary | ICD-10-CM | POA: Diagnosis not present

## 2021-04-01 DIAGNOSIS — Z9181 History of falling: Secondary | ICD-10-CM | POA: Diagnosis not present

## 2021-04-01 DIAGNOSIS — Z792 Long term (current) use of antibiotics: Secondary | ICD-10-CM | POA: Diagnosis not present

## 2021-04-01 DIAGNOSIS — E039 Hypothyroidism, unspecified: Secondary | ICD-10-CM | POA: Diagnosis not present

## 2021-04-01 DIAGNOSIS — M5136 Other intervertebral disc degeneration, lumbar region: Secondary | ICD-10-CM | POA: Diagnosis not present

## 2021-04-01 DIAGNOSIS — I071 Rheumatic tricuspid insufficiency: Secondary | ICD-10-CM | POA: Diagnosis not present

## 2021-04-01 DIAGNOSIS — I272 Pulmonary hypertension, unspecified: Secondary | ICD-10-CM | POA: Diagnosis not present

## 2021-04-01 DIAGNOSIS — M1711 Unilateral primary osteoarthritis, right knee: Secondary | ICD-10-CM | POA: Diagnosis not present

## 2021-04-01 DIAGNOSIS — Z9981 Dependence on supplemental oxygen: Secondary | ICD-10-CM | POA: Diagnosis not present

## 2021-04-01 DIAGNOSIS — J9601 Acute respiratory failure with hypoxia: Secondary | ICD-10-CM | POA: Diagnosis not present

## 2021-04-01 DIAGNOSIS — I700 Atherosclerosis of aorta: Secondary | ICD-10-CM | POA: Diagnosis not present

## 2021-04-01 DIAGNOSIS — R918 Other nonspecific abnormal finding of lung field: Secondary | ICD-10-CM | POA: Diagnosis not present

## 2021-04-01 DIAGNOSIS — E860 Dehydration: Secondary | ICD-10-CM | POA: Diagnosis not present

## 2021-04-01 DIAGNOSIS — Z7982 Long term (current) use of aspirin: Secondary | ICD-10-CM | POA: Diagnosis not present

## 2021-04-01 DIAGNOSIS — M7731 Calcaneal spur, right foot: Secondary | ICD-10-CM | POA: Diagnosis not present

## 2021-04-01 DIAGNOSIS — I493 Ventricular premature depolarization: Secondary | ICD-10-CM | POA: Diagnosis not present

## 2021-04-01 DIAGNOSIS — E1151 Type 2 diabetes mellitus with diabetic peripheral angiopathy without gangrene: Secondary | ICD-10-CM | POA: Diagnosis not present

## 2021-04-01 DIAGNOSIS — A419 Sepsis, unspecified organism: Secondary | ICD-10-CM | POA: Diagnosis not present

## 2021-04-05 DIAGNOSIS — A419 Sepsis, unspecified organism: Secondary | ICD-10-CM | POA: Diagnosis not present

## 2021-04-05 DIAGNOSIS — I071 Rheumatic tricuspid insufficiency: Secondary | ICD-10-CM | POA: Diagnosis not present

## 2021-04-05 DIAGNOSIS — E1151 Type 2 diabetes mellitus with diabetic peripheral angiopathy without gangrene: Secondary | ICD-10-CM | POA: Diagnosis not present

## 2021-04-05 DIAGNOSIS — I700 Atherosclerosis of aorta: Secondary | ICD-10-CM | POA: Diagnosis not present

## 2021-04-05 DIAGNOSIS — Z9981 Dependence on supplemental oxygen: Secondary | ICD-10-CM | POA: Diagnosis not present

## 2021-04-05 DIAGNOSIS — M7731 Calcaneal spur, right foot: Secondary | ICD-10-CM | POA: Diagnosis not present

## 2021-04-05 DIAGNOSIS — I493 Ventricular premature depolarization: Secondary | ICD-10-CM | POA: Diagnosis not present

## 2021-04-05 DIAGNOSIS — E039 Hypothyroidism, unspecified: Secondary | ICD-10-CM | POA: Diagnosis not present

## 2021-04-05 DIAGNOSIS — Z792 Long term (current) use of antibiotics: Secondary | ICD-10-CM | POA: Diagnosis not present

## 2021-04-05 DIAGNOSIS — I110 Hypertensive heart disease with heart failure: Secondary | ICD-10-CM | POA: Diagnosis not present

## 2021-04-05 DIAGNOSIS — J9601 Acute respiratory failure with hypoxia: Secondary | ICD-10-CM | POA: Diagnosis not present

## 2021-04-05 DIAGNOSIS — I509 Heart failure, unspecified: Secondary | ICD-10-CM | POA: Diagnosis not present

## 2021-04-05 DIAGNOSIS — G4733 Obstructive sleep apnea (adult) (pediatric): Secondary | ICD-10-CM | POA: Diagnosis not present

## 2021-04-05 DIAGNOSIS — M1711 Unilateral primary osteoarthritis, right knee: Secondary | ICD-10-CM | POA: Diagnosis not present

## 2021-04-05 DIAGNOSIS — Z7982 Long term (current) use of aspirin: Secondary | ICD-10-CM | POA: Diagnosis not present

## 2021-04-05 DIAGNOSIS — M5136 Other intervertebral disc degeneration, lumbar region: Secondary | ICD-10-CM | POA: Diagnosis not present

## 2021-04-05 DIAGNOSIS — I272 Pulmonary hypertension, unspecified: Secondary | ICD-10-CM | POA: Diagnosis not present

## 2021-04-05 DIAGNOSIS — L03115 Cellulitis of right lower limb: Secondary | ICD-10-CM | POA: Diagnosis not present

## 2021-04-05 DIAGNOSIS — E860 Dehydration: Secondary | ICD-10-CM | POA: Diagnosis not present

## 2021-04-05 DIAGNOSIS — Z9181 History of falling: Secondary | ICD-10-CM | POA: Diagnosis not present

## 2021-04-06 DIAGNOSIS — M79671 Pain in right foot: Secondary | ICD-10-CM | POA: Diagnosis not present

## 2021-04-06 DIAGNOSIS — S81801A Unspecified open wound, right lower leg, initial encounter: Secondary | ICD-10-CM | POA: Diagnosis not present

## 2021-04-09 DIAGNOSIS — I272 Pulmonary hypertension, unspecified: Secondary | ICD-10-CM | POA: Diagnosis not present

## 2021-04-09 DIAGNOSIS — E1151 Type 2 diabetes mellitus with diabetic peripheral angiopathy without gangrene: Secondary | ICD-10-CM | POA: Diagnosis not present

## 2021-04-09 DIAGNOSIS — G4733 Obstructive sleep apnea (adult) (pediatric): Secondary | ICD-10-CM | POA: Diagnosis not present

## 2021-04-09 DIAGNOSIS — I110 Hypertensive heart disease with heart failure: Secondary | ICD-10-CM | POA: Diagnosis not present

## 2021-04-09 DIAGNOSIS — E860 Dehydration: Secondary | ICD-10-CM | POA: Diagnosis not present

## 2021-04-09 DIAGNOSIS — I509 Heart failure, unspecified: Secondary | ICD-10-CM | POA: Diagnosis not present

## 2021-04-09 DIAGNOSIS — Z792 Long term (current) use of antibiotics: Secondary | ICD-10-CM | POA: Diagnosis not present

## 2021-04-09 DIAGNOSIS — Z9181 History of falling: Secondary | ICD-10-CM | POA: Diagnosis not present

## 2021-04-09 DIAGNOSIS — Z9981 Dependence on supplemental oxygen: Secondary | ICD-10-CM | POA: Diagnosis not present

## 2021-04-09 DIAGNOSIS — M1711 Unilateral primary osteoarthritis, right knee: Secondary | ICD-10-CM | POA: Diagnosis not present

## 2021-04-09 DIAGNOSIS — A419 Sepsis, unspecified organism: Secondary | ICD-10-CM | POA: Diagnosis not present

## 2021-04-09 DIAGNOSIS — M5136 Other intervertebral disc degeneration, lumbar region: Secondary | ICD-10-CM | POA: Diagnosis not present

## 2021-04-09 DIAGNOSIS — E039 Hypothyroidism, unspecified: Secondary | ICD-10-CM | POA: Diagnosis not present

## 2021-04-09 DIAGNOSIS — Z7982 Long term (current) use of aspirin: Secondary | ICD-10-CM | POA: Diagnosis not present

## 2021-04-09 DIAGNOSIS — J9601 Acute respiratory failure with hypoxia: Secondary | ICD-10-CM | POA: Diagnosis not present

## 2021-04-09 DIAGNOSIS — I071 Rheumatic tricuspid insufficiency: Secondary | ICD-10-CM | POA: Diagnosis not present

## 2021-04-09 DIAGNOSIS — I700 Atherosclerosis of aorta: Secondary | ICD-10-CM | POA: Diagnosis not present

## 2021-04-09 DIAGNOSIS — I493 Ventricular premature depolarization: Secondary | ICD-10-CM | POA: Diagnosis not present

## 2021-04-09 DIAGNOSIS — M7731 Calcaneal spur, right foot: Secondary | ICD-10-CM | POA: Diagnosis not present

## 2021-04-09 DIAGNOSIS — L03115 Cellulitis of right lower limb: Secondary | ICD-10-CM | POA: Diagnosis not present

## 2021-04-13 DIAGNOSIS — M1711 Unilateral primary osteoarthritis, right knee: Secondary | ICD-10-CM | POA: Diagnosis not present

## 2021-04-13 DIAGNOSIS — I071 Rheumatic tricuspid insufficiency: Secondary | ICD-10-CM | POA: Diagnosis not present

## 2021-04-13 DIAGNOSIS — M7731 Calcaneal spur, right foot: Secondary | ICD-10-CM | POA: Diagnosis not present

## 2021-04-13 DIAGNOSIS — I493 Ventricular premature depolarization: Secondary | ICD-10-CM | POA: Diagnosis not present

## 2021-04-13 DIAGNOSIS — E1151 Type 2 diabetes mellitus with diabetic peripheral angiopathy without gangrene: Secondary | ICD-10-CM | POA: Diagnosis not present

## 2021-04-13 DIAGNOSIS — E039 Hypothyroidism, unspecified: Secondary | ICD-10-CM | POA: Diagnosis not present

## 2021-04-13 DIAGNOSIS — Z9981 Dependence on supplemental oxygen: Secondary | ICD-10-CM | POA: Diagnosis not present

## 2021-04-13 DIAGNOSIS — I110 Hypertensive heart disease with heart failure: Secondary | ICD-10-CM | POA: Diagnosis not present

## 2021-04-13 DIAGNOSIS — L03115 Cellulitis of right lower limb: Secondary | ICD-10-CM | POA: Diagnosis not present

## 2021-04-13 DIAGNOSIS — I700 Atherosclerosis of aorta: Secondary | ICD-10-CM | POA: Diagnosis not present

## 2021-04-13 DIAGNOSIS — Z9181 History of falling: Secondary | ICD-10-CM | POA: Diagnosis not present

## 2021-04-13 DIAGNOSIS — J9601 Acute respiratory failure with hypoxia: Secondary | ICD-10-CM | POA: Diagnosis not present

## 2021-04-13 DIAGNOSIS — Z7982 Long term (current) use of aspirin: Secondary | ICD-10-CM | POA: Diagnosis not present

## 2021-04-13 DIAGNOSIS — I509 Heart failure, unspecified: Secondary | ICD-10-CM | POA: Diagnosis not present

## 2021-04-13 DIAGNOSIS — G4733 Obstructive sleep apnea (adult) (pediatric): Secondary | ICD-10-CM | POA: Diagnosis not present

## 2021-04-13 DIAGNOSIS — E860 Dehydration: Secondary | ICD-10-CM | POA: Diagnosis not present

## 2021-04-13 DIAGNOSIS — M5136 Other intervertebral disc degeneration, lumbar region: Secondary | ICD-10-CM | POA: Diagnosis not present

## 2021-04-13 DIAGNOSIS — Z792 Long term (current) use of antibiotics: Secondary | ICD-10-CM | POA: Diagnosis not present

## 2021-04-13 DIAGNOSIS — I272 Pulmonary hypertension, unspecified: Secondary | ICD-10-CM | POA: Diagnosis not present

## 2021-04-13 DIAGNOSIS — A419 Sepsis, unspecified organism: Secondary | ICD-10-CM | POA: Diagnosis not present

## 2021-04-16 DIAGNOSIS — I5020 Unspecified systolic (congestive) heart failure: Secondary | ICD-10-CM | POA: Diagnosis not present

## 2021-04-20 DIAGNOSIS — E785 Hyperlipidemia, unspecified: Secondary | ICD-10-CM | POA: Diagnosis not present

## 2021-04-20 DIAGNOSIS — Z0001 Encounter for general adult medical examination with abnormal findings: Secondary | ICD-10-CM | POA: Diagnosis not present

## 2021-04-20 DIAGNOSIS — S81801A Unspecified open wound, right lower leg, initial encounter: Secondary | ICD-10-CM | POA: Diagnosis not present

## 2021-04-20 DIAGNOSIS — Z9181 History of falling: Secondary | ICD-10-CM | POA: Diagnosis not present

## 2021-04-20 DIAGNOSIS — Z7189 Other specified counseling: Secondary | ICD-10-CM | POA: Diagnosis not present

## 2021-04-20 DIAGNOSIS — E039 Hypothyroidism, unspecified: Secondary | ICD-10-CM | POA: Diagnosis not present

## 2021-04-20 DIAGNOSIS — I10 Essential (primary) hypertension: Secondary | ICD-10-CM | POA: Diagnosis not present

## 2021-04-20 DIAGNOSIS — Z139 Encounter for screening, unspecified: Secondary | ICD-10-CM | POA: Diagnosis not present

## 2021-05-07 DIAGNOSIS — E785 Hyperlipidemia, unspecified: Secondary | ICD-10-CM | POA: Insufficient documentation

## 2021-05-13 ENCOUNTER — Encounter: Payer: Self-pay | Admitting: Podiatry

## 2021-05-13 ENCOUNTER — Other Ambulatory Visit: Payer: Self-pay

## 2021-05-13 ENCOUNTER — Ambulatory Visit: Payer: Medicare Other

## 2021-05-13 ENCOUNTER — Ambulatory Visit (INDEPENDENT_AMBULATORY_CARE_PROVIDER_SITE_OTHER): Payer: Self-pay | Admitting: Podiatry

## 2021-05-13 DIAGNOSIS — M7989 Other specified soft tissue disorders: Secondary | ICD-10-CM

## 2021-05-13 DIAGNOSIS — I83013 Varicose veins of right lower extremity with ulcer of ankle: Secondary | ICD-10-CM

## 2021-05-13 DIAGNOSIS — M778 Other enthesopathies, not elsewhere classified: Secondary | ICD-10-CM

## 2021-05-13 DIAGNOSIS — I872 Venous insufficiency (chronic) (peripheral): Secondary | ICD-10-CM

## 2021-05-13 DIAGNOSIS — L97311 Non-pressure chronic ulcer of right ankle limited to breakdown of skin: Secondary | ICD-10-CM

## 2021-05-13 MED ORDER — MUPIROCIN 2 % EX OINT: 1.0000 "application " | TOPICAL_OINTMENT | Freq: Two times a day (BID) | CUTANEOUS | 0 refills | Status: DC

## 2021-05-15 NOTE — Progress Notes (Signed)
Subjective:  Patient ID: Michaela Baker, female    DOB: 21-Nov-1950,  MRN: [id number] HPI Chief Complaint  Patient presents with   Foot Pain    Heel, medial and lateral ankle right - multiple wounds-heel wound intermittent since 2004, fell Oct 2022, in hospital, after leaving she developed lots of swelling in ankle, wounds on ankles started, 3 rounds of antibiotics-currently on Doxy   New Patient (Initial Visit)    70 y.o. female presents with the above complaint.   ROS: Denies fever chills nausea vomiting muscle aches pains calf pain back pain chest pain shortness of breath.  Past Medical History:  Diagnosis Date   CHF (congestive heart failure) ([facility])    Hypertension    Hypothyroidism 03/29/2017   Obesity (BMI 30-39.9)    Premature ventricular contractions    Past Surgical History:  Procedure Laterality Date   ABDOMINAL HYSTERECTOMY     CHOLECYSTECTOMY     TONSILLECTOMY     TUBAL LIGATION      Current Outpatient Medications:    mupirocin ointment (BACTROBAN) 2 %, Apply 1 application topically 2 (two) times daily., Disp: 22 g, Rfl: 0   amLODipine (NORVASC) 10 MG tablet, Take 10 mg by mouth daily., Disp: , Rfl:    aspirin EC 81 MG EC tablet, Take 1 tablet (81 mg total) by mouth daily., Disp: 30 tablet, Rfl: 1   dapagliflozin propanediol (FARXIGA) 10 MG TABS tablet, Take 10 mg by mouth daily., Disp: , Rfl:    doxycycline (VIBRA-TABS) 100 MG tablet, Take 100 mg by mouth 2 (two) times daily., Disp: , Rfl:    furosemide (LASIX) 40 MG tablet, Take 40 mg by mouth daily., Disp: , Rfl:    levothyroxine (SYNTHROID, LEVOTHROID) 25 MCG tablet, Take 25 mcg by mouth daily., Disp: , Rfl: 5   metoprolol succinate (TOPROL-XL) 50 MG 24 hr tablet, Take 50 mg by mouth daily. Take with or immediately following a meal., Disp: , Rfl:    Multiple Vitamins-Minerals (MULTIVITAMIN WITH MINERALS) tablet, Take 1 tablet by mouth daily., Disp: , Rfl:    Omega-3 Fatty Acids (FISH OIL) 1000 MG CAPS, Take 1,000  mg by mouth daily., Disp: , Rfl:    potassium chloride (K-DUR,KLOR-CON) 10 MEQ tablet, Take 20 mEq by mouth daily., Disp: , Rfl:    rosuvastatin (CRESTOR) 5 MG tablet, Take 5 mg by mouth daily., Disp: , Rfl:    thiamine 100 MG tablet, Take 100 mg by mouth daily., Disp: , Rfl:   No Known Allergies Review of Systems Objective:  There were no vitals filed for this visit.  General: Well developed, nourished, in no acute distress, alert and oriented x3   Dermatological: Skin is warm, dry and supple bilateral. Nails x 10 are well maintained; remaining integument appears unremarkable at this time. There are no open sores, no preulcerative lesions, no rash or signs of infection present.  She has superficial wounds to the medial and lateral ankle and a very superficial abrasion to the plantar heel with sloughing of some of the tissue.  The wounds are very small very minimal no drainage no purulence no malodor.  Vascular: Dorsalis Pedis artery and Posterior Tibial artery pedal pulses are 2/4 bilateral with immedate capillary fill time. Pedal hair growth present. No varicosities.  Right lower extremity even to the level of the thigh is swollen with pitting edema.  Left leg is swollen minimal edema and nonpitting.  Neruologic: Grossly intact via light touch bilateral. Vibratory intact via tuning fork bilateral.  Protective threshold with Semmes Wienstein monofilament intact to all pedal sites bilateral. Patellar and Achilles deep tendon reflexes 2+ bilateral. No Babinski or clonus noted bilateral.   Musculoskeletal: No gross boney pedal deformities bilateral. No pain, crepitus, or limitation noted with foot and ankle range of motion bilateral. Muscular strength 5/5 in all groups tested bilateral.  Gait: Unassisted, Nonantalgic.    Radiographs:  None taken at this time does not appear to be osseous.  Assessment & Plan:   Assessment: Venous stasis dermatitis resulting in ulceration and most likely  previous cellulitis.  Plan: We will send her for venous Doppler studies.  Redressed the wounds today.  There is no purulence for cultures.  I placed her in a light compression dressing placed her in a Darco shoe and wrote a prescription for Bactroban ointment to be applied to these wounds daily before dressing.  I will follow-up with her once we get the results of the Doppler     Doneta Bayman T. [place], [place]

## 2021-05-16 DIAGNOSIS — I5020 Unspecified systolic (congestive) heart failure: Secondary | ICD-10-CM | POA: Diagnosis not present

## 2021-05-28 DIAGNOSIS — S81809A Unspecified open wound, unspecified lower leg, initial encounter: Secondary | ICD-10-CM | POA: Insufficient documentation

## 2021-06-01 ENCOUNTER — Ambulatory Visit: Payer: Medicare Other | Admitting: Podiatry

## 2021-06-10 ENCOUNTER — Other Ambulatory Visit: Payer: Self-pay

## 2021-06-10 ENCOUNTER — Ambulatory Visit (INDEPENDENT_AMBULATORY_CARE_PROVIDER_SITE_OTHER): Payer: Commercial Managed Care - HMO | Admitting: Podiatry

## 2021-06-10 ENCOUNTER — Encounter: Payer: Self-pay | Admitting: Podiatry

## 2021-06-10 DIAGNOSIS — L97311 Non-pressure chronic ulcer of right ankle limited to breakdown of skin: Secondary | ICD-10-CM

## 2021-06-10 DIAGNOSIS — I872 Venous insufficiency (chronic) (peripheral): Secondary | ICD-10-CM | POA: Diagnosis not present

## 2021-06-10 DIAGNOSIS — I83013 Varicose veins of right lower extremity with ulcer of ankle: Secondary | ICD-10-CM | POA: Diagnosis not present

## 2021-06-10 DIAGNOSIS — M7989 Other specified soft tissue disorders: Secondary | ICD-10-CM | POA: Diagnosis not present

## 2021-06-10 NOTE — Progress Notes (Signed)
She presents today for follow-up of ulceration to the plantar aspect of her right foot she has failed to go to the vascular doctors for her venous insufficiency test as of yet she states that her voicemail was full and she been getting messages.  States that the wound is doing much better she continues to dress it on a daily basis with Bactroban ointment and bandage.  Objective: Vital signs are stable alert oriented x3 there is no erythema edema cellulitis drainage or odor the wound is 99% healed at this point there is no drainage on the Band-Aid that she has had on since yesterday.  Assessment venous insufficiency venous stasis ulceration.  Plan: She is to follow-up with me in 1 month but I want her to continue on with following up with the vascular doctors we did provide her with the phone number for them so she can call them herself.

## 2021-06-15 ENCOUNTER — Other Ambulatory Visit: Payer: Self-pay | Admitting: Podiatry

## 2021-06-15 NOTE — Telephone Encounter (Signed)
Please advise 

## 2021-06-16 DIAGNOSIS — I5020 Unspecified systolic (congestive) heart failure: Secondary | ICD-10-CM | POA: Diagnosis not present

## 2021-06-17 ENCOUNTER — Other Ambulatory Visit: Payer: Self-pay

## 2021-06-17 ENCOUNTER — Ambulatory Visit (HOSPITAL_COMMUNITY)
Admission: RE | Admit: 2021-06-17 | Discharge: 2021-06-17 | Disposition: A | Discharge status: Home / Self Care | Payer: Medicare Other | Source: Ambulatory Visit | Attending: Podiatry | Admitting: Podiatry

## 2021-06-17 DIAGNOSIS — I872 Venous insufficiency (chronic) (peripheral): Secondary | ICD-10-CM | POA: Diagnosis not present

## 2021-06-17 DIAGNOSIS — I83013 Varicose veins of right lower extremity with ulcer of ankle: Secondary | ICD-10-CM | POA: Insufficient documentation

## 2021-06-17 DIAGNOSIS — M7989 Other specified soft tissue disorders: Secondary | ICD-10-CM | POA: Diagnosis not present

## 2021-06-17 DIAGNOSIS — L97311 Non-pressure chronic ulcer of right ankle limited to breakdown of skin: Secondary | ICD-10-CM | POA: Insufficient documentation

## 2021-07-13 ENCOUNTER — Other Ambulatory Visit: Payer: Self-pay

## 2021-07-13 ENCOUNTER — Ambulatory Visit (INDEPENDENT_AMBULATORY_CARE_PROVIDER_SITE_OTHER): Payer: Medicare Other | Admitting: Podiatry

## 2021-07-13 DIAGNOSIS — I872 Venous insufficiency (chronic) (peripheral): Secondary | ICD-10-CM

## 2021-07-13 NOTE — Progress Notes (Signed)
She presents today for follow-up of her ulcerative lesion plantar aspect of her right heel.  She has been to vascular and had her vascular studies done and they were supposed to call her to set up an appointment for her to come back in.  Objective: Vital signs stable alert and oriented x3.  Pulses are palpable right she still has the venous insufficiency right she still has some skin very superficial breakdown to her right heel.  This appears to be healing very nicely the ankle lesions appear to be healing very nicely.  Assessment: Venous insufficiency per vascular studies.  Mild venous stasis ulceration plantar heel.  Plan: Get in touch with vascular for possible ablation.Marland Kitchen

## 2021-07-17 DIAGNOSIS — I5020 Unspecified systolic (congestive) heart failure: Secondary | ICD-10-CM | POA: Diagnosis not present

## 2021-07-20 ENCOUNTER — Telehealth: Payer: Self-pay | Admitting: Podiatry

## 2021-07-20 DIAGNOSIS — I83013 Varicose veins of right lower extremity with ulcer of ankle: Secondary | ICD-10-CM

## 2021-07-20 DIAGNOSIS — I872 Venous insufficiency (chronic) (peripheral): Secondary | ICD-10-CM

## 2021-07-20 DIAGNOSIS — L97311 Non-pressure chronic ulcer of right ankle limited to breakdown of skin: Secondary | ICD-10-CM

## 2021-07-20 NOTE — Addendum Note (Signed)
Addended by: Rip Harbour on: 07/20/2021 03:13 PM   Modules accepted: Orders

## 2021-07-20 NOTE — Telephone Encounter (Signed)
[facility] called stating Michaela Baker showed up to their office with the intention of her being seen. They have not received an order for her.  Please advise.

## 2021-07-20 NOTE — Telephone Encounter (Signed)
Referral placed for vascular consult

## 2021-08-10 ENCOUNTER — Ambulatory Visit (INDEPENDENT_AMBULATORY_CARE_PROVIDER_SITE_OTHER): Payer: Medicare Other | Admitting: Podiatry

## 2021-08-10 ENCOUNTER — Other Ambulatory Visit: Payer: Self-pay

## 2021-08-10 DIAGNOSIS — L97311 Non-pressure chronic ulcer of right ankle limited to breakdown of skin: Secondary | ICD-10-CM

## 2021-08-10 DIAGNOSIS — I83013 Varicose veins of right lower extremity with ulcer of ankle: Secondary | ICD-10-CM | POA: Diagnosis not present

## 2021-08-10 NOTE — Progress Notes (Signed)
She presents today for follow-up of her ulcer she states that been bleeding some on and off but it seems to be doing fine now.  She never was able to catch up with vascular.  I let her know today that vascular did try to call her but her voicemail was full. ? ?Objective: Vital signs are stable alert and oriented x3.  Pulses are palpable.  She has venous insufficiency right lower extremity with superficial ulceration which currently has healed on the plantar aspect of the heel. ? ?Assessment: Venous insufficiency with ulceration right heel. ? ?Plan: Placed padding and osteoporosis dressing today we will attempt to contact vascular once again as well as make sure that she has cleaned out her inbox on her phone for her voicemail.  Follow-up with her in 1 month at any rate. ?

## 2021-08-14 DIAGNOSIS — I5020 Unspecified systolic (congestive) heart failure: Secondary | ICD-10-CM | POA: Diagnosis not present

## 2021-09-06 NOTE — Progress Notes (Signed)
? ? ? ? ? ? ? ?[facility]  ?         [facility] [place]  ?History and Physical ? ? ?Michaela Baker is a 71 y.o. female who presents with venous insufficiency.  She has been seeing podiatry for an ulcerative lesion on the plantar aspect of her right heel.  She was found to have venous insufficiency and was referred to our office for further evaluation.   ? ?She states she fell in October 2022.  She had a DVT study at the hospital that was negative for DVT.  She does not have any hx of DVT to her knowledge.  She does have some discoloration of her leg that she states has been present since her fall.  Her right leg swells more than the left.  She has not really used compression socks in the past.  Her swelling improves with elevation and after she has been in bed at night.  She does not have any family hx of varicose veins.  ? ?She states that she has a sore on her right heel.  She states that it does get better and worse and has been present for some time.  She states that the podiatrist told her that her blood flow was good.  ? ?The pt is on a statin for cholesterol management.  ?The pt is on a daily aspirin.   Other AC:  none ?The pt is on CCB, BB, diuretic for hypertension.   ?The pt is diabetic.   ?Tobacco hx:  never ? ? ?Past Medical History:  ?Diagnosis Date  ? CHF (congestive heart failure) ([place])   ? Hypertension   ? Hypothyroidism 03/29/2017  ? Obesity (BMI 30-39.9)   ? Premature ventricular contractions   ? ? ?Past Surgical History:  ?Procedure Laterality Date  ? ABDOMINAL HYSTERECTOMY    ? CHOLECYSTECTOMY    ? TONSILLECTOMY    ? TUBAL LIGATION    ? ? ?Social History  ? ?Socioeconomic History  ? Marital status: Married  ?  Spouse name: Not on file  ? Number of children: 2  ? Years of education: Not on file  ? Highest education level: Not on file  ?Occupational History  ? Not on file  ?Tobacco Use  ? Smoking status: Never  ? Smokeless tobacco: Never  ?Vaping Use  ? Vaping Use: Never used   ?Substance and Sexual Activity  ? Alcohol use: No  ? Drug use: No  ? Sexual activity: Never  ?Other Topics Concern  ? Not on file  ?Social History Narrative  ? Not on file  ? ?Social Determinants of Health  ? ?Financial Resource Strain: Not on file  ?Food Insecurity: Not on file  ?Transportation Needs: Not on file  ?Physical Activity: Not on file  ?Stress: Not on file  ?Social Connections: Not on file  ?Intimate Partner Violence: Not on file  ? ? ? ?Family History  ?Problem Relation Age of Onset  ? Diabetes Mother   ? Heart failure Mother   ? Kidney failure Mother   ? Kidney failure Sister   ? Diabetes Sister   ? Heart attack Sister   ? Breast cancer Neg Hx   ? ? ?Current Outpatient Medications  ?Medication Sig Dispense Refill  ? amLODipine (NORVASC) 10 MG tablet Take 10 mg by mouth daily.    ? aspirin EC 81 MG EC tablet Take 1 tablet (81 mg total) by mouth daily. 30 tablet 1  ? dapagliflozin  propanediol (FARXIGA) 10 MG TABS tablet Take 10 mg by mouth daily.    ? furosemide (LASIX) 40 MG tablet Take 40 mg by mouth daily.    ? levothyroxine (SYNTHROID, LEVOTHROID) 25 MCG tablet Take 25 mcg by mouth daily.  5  ? metoprolol succinate (TOPROL-XL) 50 MG 24 hr tablet Take 50 mg by mouth daily. Take with or immediately following a meal.    ? Multiple Vitamins-Minerals (MULTIVITAMIN WITH MINERALS) tablet Take 1 tablet by mouth daily.    ? mupirocin ointment (BACTROBAN) 2 % Apply 1 application to affected area topically 2 times daily. 22 g 0  ? Omega-3 Fatty Acids (FISH OIL) 1000 MG CAPS Take 1,000 mg by mouth daily.    ? potassium chloride (K-DUR,KLOR-CON) 10 MEQ tablet Take 20 mEq by mouth daily.    ? rosuvastatin (CRESTOR) 5 MG tablet Take 5 mg by mouth daily.    ? thiamine 100 MG tablet Take 100 mg by mouth daily.    ? ?No current facility-administered medications for this visit.  ? ? ?No Known Allergies ? ?REVIEW OF SYSTEMS:  ? ?'[X]'$  denotes positive finding, '[ ]'$  denotes negative finding ?Cardiac  Comments:  ?Chest pain  or chest pressure:    ?Shortness of breath upon exertion:    ?Short of breath when lying flat:    ?Irregular heart rhythm:    ?    ?Vascular    ?Pain in calf, thigh, or hip brought on by ambulation:    ?Pain in feet at night that wakes you up from your sleep:     ?Blood clot in your veins:    ?Leg swelling:  x   ?    ?Pulmonary    ?Oxygen at home:    ?Productive cough:     ?Wheezing:     ?    ?Neurologic    ?Sudden weakness in arms or legs:     ?Sudden numbness in arms or legs:     ?Sudden onset of difficulty speaking or slurred speech:    ?Temporary loss of vision in one eye:     ?Problems with dizziness:     ?    ?Gastrointestinal    ?Blood in stool:     ?Vomited blood:     ?    ?Genitourinary    ?Burning when urinating:     ?Blood in urine:    ?    ?Psychiatric    ?Major depression:     ?    ?Hematologic    ?Bleeding problems:    ?Problems with blood clotting too easily:    ?    ?Skin    ?Rashes or ulcers:    ?    ?Constitutional    ?Fever or chills:    ? ? ?PHYSICAL EXAMINATION: ? ?Today's Vitals  ? 09/09/21 0850  ?BP: (!) 143/76  ?Pulse: 63  ?Temp: 97.7 ?F (36.5 ?C)  ?Weight: 203 lb (92.1 kg)  ?Height: '5\' 5"'$  (1.651 m)  ? ?Body mass index is 33.78 kg/m?. ? ? ?General:  WDWN in NAD; vital signs documented above ?Gait: Not observed ?HENT: WNL, normocephalic ?Pulmonary: normal non-labored breathing without wheezing ?Cardiac: regular HR; without carotid bruits ?Abdomen: soft, NT, no masses; aortic pulse is not palpable ?Skin: without rashes ?Vascular Exam/Pulses: ? Right Left  ?Radial 2+ (normal) 2+ (normal)  ?DP 2+ (normal) 2+ (normal)  ?PT Unable to palpate Unable to palpate  ? ?Extremities: RLE swelling with hemosiderin staining ? ?  ?Neurologic: A&O X 3;  moving all extremities equally ?Psychiatric:  The pt has Normal affect. ? ? ?Non-Invasive Vascular Imaging:   ?Venous duplex on 06/17/2021: ?Venous Reflux Times  ?+--------------+---------+------+-----------+------------+--------+  ?RIGHT         Reflux  NoRefluxReflux TimeDiameter cmsComments  ?                        Yes                                   ?+--------------+---------+------+-----------+------------+--------+  ?CFV           no                                              ?+--------------+---------+------+-----------+------------+--------+  ?FV mid        no                                              ?+--------------+---------+------+-----------+------------+--------+  ?Popliteal     no                                              ?+--------------+---------+------+-----------+------------+--------+  ?GSV at [facility]              yes    >500 ms      0.80              ?+--------------+---------+------+-----------+------------+--------+  ?GSV prox thigh          yes    >500 ms      0.61              ?+--------------+---------+------+-----------+------------+--------+  ?GSV mid thigh           yes    >500 ms      0.62              ?+--------------+---------+------+-----------+------------+--------+  ?GSV dist thigh          yes    >500 ms      0.80              ?+--------------+---------+------+-----------+------------+--------+  ?GSV at knee             yes    >500 ms      0.57              ?+--------------+---------+------+-----------+------------+--------+  ?GSV prox calf           yes    >500 ms      0.59              ?+--------------+---------+------+-----------+------------+--------+  ?SSV Pop Fossa no                            0.39              ?+--------------+---------+------+-----------+------------+--------+  ?SSV prox calf           yes    >500 ms      0.41              ?+--------------+---------+------+-----------+------------+--------+  ?  SSV mid calf  no                            0.41              ?+--------------+---------+------+-----------+------------+--------+  ?Enlarged lymph node noted in the groin.  ? ?Summary:  ?Right:  ?- No evidence of  deep vein thrombosis seen in the right lower extremity, from the common femoral through the popliteal veins.  ?- No evidence of superficial venous thrombosis in the right lower extremity.  ? ?ABI/TBI 03/03/2021: ?ABI Findings:  ?+---------+------------------+-----+----------+----------+  ?Right    Rt Pressure (mmH

## 2021-09-08 ENCOUNTER — Other Ambulatory Visit: Payer: Self-pay | Admitting: Nurse Practitioner

## 2021-09-08 DIAGNOSIS — Z1231 Encounter for screening mammogram for malignant neoplasm of breast: Secondary | ICD-10-CM

## 2021-09-09 ENCOUNTER — Ambulatory Visit (INDEPENDENT_AMBULATORY_CARE_PROVIDER_SITE_OTHER): Payer: Medicare Other | Admitting: Physician Assistant

## 2021-09-09 VITALS — BP 143/76 | HR 63 | Temp 97.7°F | Ht 65.0 in | Wt 203.0 lb

## 2021-09-09 DIAGNOSIS — M7989 Other specified soft tissue disorders: Secondary | ICD-10-CM | POA: Diagnosis not present

## 2021-09-14 ENCOUNTER — Ambulatory Visit: Payer: Medicare Other | Admitting: Podiatry

## 2021-09-23 ENCOUNTER — Ambulatory Visit (INDEPENDENT_AMBULATORY_CARE_PROVIDER_SITE_OTHER): Payer: Medicare Other | Admitting: Podiatry

## 2021-09-23 ENCOUNTER — Encounter: Payer: Self-pay | Admitting: Podiatry

## 2021-09-23 DIAGNOSIS — I83013 Varicose veins of right lower extremity with ulcer of ankle: Secondary | ICD-10-CM | POA: Diagnosis not present

## 2021-09-23 DIAGNOSIS — L97311 Non-pressure chronic ulcer of right ankle limited to breakdown of skin: Secondary | ICD-10-CM

## 2021-09-23 NOTE — Progress Notes (Signed)
Michaela Baker presents today for follow-up of her ulceration plantar aspect of her right heel.  She is recently followed up with vein and vascular specialists who have suggested that they will follow-up with her again in July for surgical consult with the surgeon to see if there is any reparations that can be made for her venous insufficiency.  At this point she states that she is doing quite well has not had a lot of bleeding has not had a lot of drainage. ? ?Objective: Vital signs stable alert oriented x3 right lower extremity still demonstrates venous congestion venous insufficiencies with superficial venous ulceration plantar aspect of her right heel.  There is some mild drainage on her bandage but it does not have odor and is not purulent.  No open wounds with the exception of those noted as mentioned. ? ?Assessment: Noncompressible peripheral arterial disease she has venous stasis with venous insufficiency.  Venous ulceration. ? ?Plan: I debrided the area today placed a antibiotic dressing she will continue to do so on a daily basis and I will follow-up with her in 1 month.  We will watch to make sure that she is improving not worsening and that she has no infection. ?

## 2021-10-18 ENCOUNTER — Ambulatory Visit
Admission: RE | Admit: 2021-10-18 | Discharge: 2021-10-18 | Disposition: A | Discharge status: Home / Self Care | Payer: Medicare Other | Source: Ambulatory Visit | Attending: Nurse Practitioner | Admitting: Nurse Practitioner

## 2021-10-18 DIAGNOSIS — Z1231 Encounter for screening mammogram for malignant neoplasm of breast: Secondary | ICD-10-CM

## 2021-12-01 ENCOUNTER — Telehealth: Payer: Self-pay | Admitting: Podiatry

## 2021-12-01 NOTE — Telephone Encounter (Signed)
I called the patient and attempted to get her to come and see Dr. Posey Pronto this afternoon.  I stressed the importance of her seeing someone sooner than her scheduled appointment.  She stated it had stopped bleeding and it doesn't hurt anymore.  I asked her if there was a foul odor, redness, swelling, and if she's Diabetic.  She stated she didn't have any of those symptoms and that she's not Diabetic.  She said she would just keep her scheduled appointment on the 19th with Dr. Milinda Pointer.  She said her daughter has her car and wouldn't be able to come today anyway.

## 2021-12-01 NOTE — Telephone Encounter (Signed)
"  I have an appointment with Dr. Milinda Pointer on the 19th.  My right foot has been leaking some blood and my left foot has a place on it also.  Do I need to know do I need to go to the [facility] or where do I need to go.  Someone give me a call as soon as possible if you can."

## 2021-12-15 ENCOUNTER — Encounter: Payer: Self-pay | Admitting: Vascular Surgery

## 2021-12-15 ENCOUNTER — Ambulatory Visit (INDEPENDENT_AMBULATORY_CARE_PROVIDER_SITE_OTHER): Payer: Medicare Other | Admitting: Vascular Surgery

## 2021-12-15 VITALS — BP 127/79 | HR 59 | Temp 98.2°F | Resp 18 | Ht 65.0 in | Wt 217.2 lb

## 2021-12-15 DIAGNOSIS — I872 Venous insufficiency (chronic) (peripheral): Secondary | ICD-10-CM | POA: Diagnosis not present

## 2021-12-15 NOTE — Progress Notes (Signed)
REASON FOR VISIT:   Follow-up of chronic venous insufficiency.  MEDICAL ISSUES:   CHRONIC VENOUS INSUFFICIENCY: This patient has CEAP C4 venous disease.  She is continuing to have symptoms despite thigh-high compression stockings and elevation in addition exercise.  This reason I think she would be a good candidate for laser ablation of the left great saphenous vein.  In addition we have again discussed the importance of daily leg elevation and the proper positioning for this.  I have encouraged her to continue to use her compression stockings.  Once again we discussed the importance of exercise specifically walking and water aerobics.  I encouraged her to avoid prolonged sitting and standing.  We also discussed the importance of maintaining a healthy weight as central obesity especially increases lower extremity venous pressure.  I have discussed the indications for endovenous laser ablation of the right GSV, that is to lower the pressure in the veins and potentially help relieve the symptoms from venous hypertension.  I have also discussed alternative options such as conservative treatment as described above. I have discussed the potential complications of the procedure, including bleeding, bruising, leg swelling, deep venous thrombosis (<1% risk), or failure of the vein to close <1% risk).  I have also explained that venous insufficiency is a chronic disease, and that the patient is at risk for recurrent varicose veins in the future.  All of the patient's questions were encouraged and answered. They are agreeable to proceed.   HPI:   Michaela Baker is a pleasant 71 y.o. female who was seen by Leontine Locket, PA on 09/09/2021 with chronic venous insufficiency.  She had been seen by podiatry within the ulceration on her right heel.  She had evidence of venous insufficiency and was referred for vascular evaluation.  She had a negative DVT study in October 2022.  She had fallen which prompted this  study.  She is also had noninvasive arterial studies done on 03/03/2021.  Results of this are noted below.  She appeared to have adequate perfusion bilaterally based on her toe pressures.  The patient did have palpable DP pulses bilaterally.  She was prescribed thigh-high compression stockings with a gradient of 20 to 30 mmHg and encouraged to elevate her legs.  She is also encouraged to exercise.  Importance of weight management was also discussed.  She comes in for 80-monthfollow-up visit to discuss possible laser ablation of the right great saphenous vein.  Since she was seen last she still has an ulcer on the plantar aspect of her right heel.  She also has significant right lower extremity swelling which is chronic.  She has been wearing her thigh-high compression stockings with a gradient of 20 to 30 mmHg.  She has been elevating her legs.  She does describe some aching pain and heaviness in the right leg which is aggravated by sitting and standing and relieved with elevation.  Past Medical History:  Diagnosis Date   CHF (congestive heart failure) ([place]    Hypertension    Hypothyroidism 03/29/2017   Obesity (BMI 30-39.9)    Premature ventricular contractions     Family History  Problem Relation Age of Onset   Diabetes Mother    Heart failure Mother    Kidney failure Mother    Kidney failure Sister    Diabetes Sister    Heart attack Sister    Breast cancer Neg Hx     SOCIAL HISTORY: Social History   Tobacco Use  Smoking status: Never   Smokeless tobacco: Never  Substance Use Topics   Alcohol use: No    No Known Allergies  Current Outpatient Medications  Medication Sig Dispense Refill   amLODipine (NORVASC) 10 MG tablet Take 10 mg by mouth daily.     aspirin EC 81 MG EC tablet Take 1 tablet (81 mg total) by mouth daily. 30 tablet 1   dapagliflozin propanediol (FARXIGA) 10 MG TABS tablet Take 10 mg by mouth daily.     furosemide (LASIX) 40 MG tablet Take 40 mg by mouth  daily.     levothyroxine (SYNTHROID, LEVOTHROID) 25 MCG tablet Take 25 mcg by mouth daily.  5   metoprolol succinate (TOPROL-XL) 50 MG 24 hr tablet Take 50 mg by mouth daily. Take with or immediately following a meal.     Multiple Vitamins-Minerals (MULTIVITAMIN WITH MINERALS) tablet Take 1 tablet by mouth daily.     mupirocin ointment (BACTROBAN) 2 % Apply 1 application to affected area topically 2 times daily. 22 g 0   Omega-3 Fatty Acids (FISH OIL) 1000 MG CAPS Take 1,000 mg by mouth daily.     potassium chloride (K-DUR,KLOR-CON) 10 MEQ tablet Take 20 mEq by mouth daily.     rosuvastatin (CRESTOR) 5 MG tablet Take 5 mg by mouth daily.     thiamine 100 MG tablet Take 100 mg by mouth daily.     No current facility-administered medications for this visit.    REVIEW OF SYSTEMS:  '[X]'$  denotes positive finding, '[ ]'$  denotes negative finding Cardiac  Comments:  Chest pain or chest pressure:    Shortness of breath upon exertion:    Short of breath when lying flat:    Irregular heart rhythm:        Vascular    Pain in calf, thigh, or hip brought on by ambulation:    Pain in feet at night that wakes you up from your sleep:     Blood clot in your veins:    Leg swelling:  x       Pulmonary    Oxygen at home:    Productive cough:     Wheezing:         Neurologic    Sudden weakness in arms or legs:     Sudden numbness in arms or legs:     Sudden onset of difficulty speaking or slurred speech:    Temporary loss of vision in one eye:     Problems with dizziness:         Gastrointestinal    Blood in stool:     Vomited blood:         Genitourinary    Burning when urinating:     Blood in urine:        Psychiatric    Major depression:         Hematologic    Bleeding problems:    Problems with blood clotting too easily:        Skin    Rashes or ulcers:        Constitutional    Fever or chills:     PHYSICAL EXAM:   Vitals:   12/15/21 1347  BP: 127/79  Pulse: (!) 59  Resp:  18  Temp: 98.2 F (36.8 C)  TempSrc: Temporal  SpO2: 93%  Weight: 217 lb 3.2 oz (98.5 kg)  Height: '5\' 5"'$  (1.651 m)    GENERAL: The patient is a well-nourished female, in no acute distress.  The vital signs are documented above. CARDIAC: There is a regular rate and rhythm.  VASCULAR: I do not detect carotid bruits. On the right side I could not palpate a dorsalis pedis or posterior tibial pulse because she has significant right lower extremity swelling.  However I was able to get a biphasic posterior tibial signal with the Doppler and a biphasic lateral tarsal signal on the right foot with the Doppler. On the left side she has a palpable dorsalis pedis pulse.  She has biphasic signals in the left foot in the dorsalis pedis and posterior tibial positions. She has significant right lower extremity swelling as documented in the photograph below.  She also has hyperpigmentation.   I did look at her right great saphenous vein myself with the SonoSite and she has significant reflux down to the proximal calf.  The vein has diameters ranging from 6 to 8 mm.  I would likely cannulate the vein in the distal thigh.  PULMONARY: There is good air exchange bilaterally without wheezing or rales. ABDOMEN: Soft and non-tender with normal pitched bowel sounds.  MUSCULOSKELETAL: There are no major deformities or cyanosis. NEUROLOGIC: No focal weakness or paresthesias are detected. SKIN: There are no ulcers or rashes noted. PSYCHIATRIC: The patient has a normal affect.  DATA:    VENOUS DUPLEX: I have reviewed the venous duplex scan that was done on 06/17/2021.  This was of the right lower extremity only.  There was no evidence of DVT.  There is no deep venous reflux.  There was superficial venous reflux from the right great saphenous vein to the proximal calf.  Diameters of the vein ranged from 6 to 8 mm.    ARTERIAL DOPPLER STUDY: I have reviewed the arterial Doppler study that was done on  03/03/2021.  On the right side there was a monophasic dorsalis pedis and posterior tibial signal.  ABI was greater than 100%.  This is likely falsely elevated secondary to calcific disease.  However toe pressure was 82 mmHg which would suggest adequate perfusion for healing.  On the left side there was a biphasic dorsalis pedis and posterior tibial signal.  Again the ABI was greater than 100%.  This may be falsely elevated secondary to calcific disease.  Toe pressure was 101 mmHg.  Deitra Mayo [facility] [phone]

## 2021-12-27 ENCOUNTER — Encounter (HOSPITAL_COMMUNITY): Payer: Self-pay

## 2021-12-27 ENCOUNTER — Other Ambulatory Visit: Payer: Self-pay

## 2021-12-27 ENCOUNTER — Inpatient Hospital Stay (HOSPITAL_COMMUNITY)
Admission: EM | Admit: 2021-12-27 | Discharge: 2021-12-31 | DRG: 378 | Disposition: A | Discharge status: Home / Self Care | Payer: Medicare Other | Attending: Family Medicine | Admitting: Family Medicine

## 2021-12-27 DIAGNOSIS — E039 Hypothyroidism, unspecified: Secondary | ICD-10-CM | POA: Diagnosis present

## 2021-12-27 DIAGNOSIS — I493 Ventricular premature depolarization: Secondary | ICD-10-CM | POA: Diagnosis present

## 2021-12-27 DIAGNOSIS — N179 Acute kidney failure, unspecified: Secondary | ICD-10-CM | POA: Diagnosis present

## 2021-12-27 DIAGNOSIS — K5731 Diverticulosis of large intestine without perforation or abscess with bleeding: Principal | ICD-10-CM | POA: Diagnosis present

## 2021-12-27 DIAGNOSIS — K922 Gastrointestinal hemorrhage, unspecified: Secondary | ICD-10-CM | POA: Diagnosis not present

## 2021-12-27 DIAGNOSIS — Z79899 Other long term (current) drug therapy: Secondary | ICD-10-CM

## 2021-12-27 DIAGNOSIS — K644 Residual hemorrhoidal skin tags: Secondary | ICD-10-CM | POA: Diagnosis present

## 2021-12-27 DIAGNOSIS — I872 Venous insufficiency (chronic) (peripheral): Secondary | ICD-10-CM | POA: Diagnosis present

## 2021-12-27 DIAGNOSIS — Z841 Family history of disorders of kidney and ureter: Secondary | ICD-10-CM

## 2021-12-27 DIAGNOSIS — Z6836 Body mass index (BMI) 36.0-36.9, adult: Secondary | ICD-10-CM

## 2021-12-27 DIAGNOSIS — Z7982 Long term (current) use of aspirin: Secondary | ICD-10-CM

## 2021-12-27 DIAGNOSIS — R55 Syncope and collapse: Secondary | ICD-10-CM | POA: Diagnosis not present

## 2021-12-27 DIAGNOSIS — K625 Hemorrhage of anus and rectum: Secondary | ICD-10-CM | POA: Diagnosis not present

## 2021-12-27 DIAGNOSIS — Z79890 Hormone replacement therapy (postmenopausal): Secondary | ICD-10-CM

## 2021-12-27 DIAGNOSIS — D62 Acute posthemorrhagic anemia: Secondary | ICD-10-CM | POA: Diagnosis present

## 2021-12-27 DIAGNOSIS — E669 Obesity, unspecified: Secondary | ICD-10-CM | POA: Diagnosis present

## 2021-12-27 DIAGNOSIS — Z9049 Acquired absence of other specified parts of digestive tract: Secondary | ICD-10-CM

## 2021-12-27 DIAGNOSIS — Z90710 Acquired absence of both cervix and uterus: Secondary | ICD-10-CM

## 2021-12-27 DIAGNOSIS — I10 Essential (primary) hypertension: Secondary | ICD-10-CM | POA: Diagnosis present

## 2021-12-27 DIAGNOSIS — L03115 Cellulitis of right lower limb: Secondary | ICD-10-CM | POA: Diagnosis present

## 2021-12-27 DIAGNOSIS — R509 Fever, unspecified: Secondary | ICD-10-CM | POA: Diagnosis not present

## 2021-12-27 DIAGNOSIS — D472 Monoclonal gammopathy: Secondary | ICD-10-CM | POA: Diagnosis present

## 2021-12-27 DIAGNOSIS — L97519 Non-pressure chronic ulcer of other part of right foot with unspecified severity: Secondary | ICD-10-CM | POA: Diagnosis present

## 2021-12-27 LAB — RETICULOCYTES
Immature Retic Fract: 12.7 % (ref 2.3–15.9)
RBC.: 4.19 MIL/uL (ref 3.87–5.11)
Retic Count, Absolute: 67.9 10*3/uL (ref 19.0–186.0)
Retic Ct Pct: 1.6 % (ref 0.4–3.1)

## 2021-12-27 LAB — COMPREHENSIVE METABOLIC PANEL
ALT: 13 U/L (ref 0–44)
AST: 17 U/L (ref 15–41)
Albumin: 3 g/dL — ABNORMAL LOW (ref 3.5–5.0)
Alkaline Phosphatase: 56 U/L (ref 38–126)
Anion gap: 7 (ref 5–15)
BUN: 29 mg/dL — ABNORMAL HIGH (ref 8–23)
CO2: 23 mmol/L (ref 22–32)
Calcium: 8.8 mg/dL — ABNORMAL LOW (ref 8.9–10.3)
Chloride: 107 mmol/L (ref 98–111)
Creatinine, Ser: 1.34 mg/dL — ABNORMAL HIGH (ref 0.44–1.00)
GFR, Estimated: 42 mL/min — ABNORMAL LOW (ref >60)
Glucose, Bld: 156 mg/dL — ABNORMAL HIGH (ref 70–99)
Potassium: 3.8 mmol/L (ref 3.5–5.1)
Sodium: 137 mmol/L (ref 135–145)
Total Bilirubin: 0.5 mg/dL (ref 0.3–1.2)
Total Protein: 7.9 g/dL (ref 6.5–8.1)

## 2021-12-27 LAB — IRON AND TIBC
Iron: 38 ug/dL (ref 28–170)
Saturation Ratios: 18 % (ref 10.4–31.8)
TIBC: 217 ug/dL — ABNORMAL LOW (ref 250–450)
UIBC: 179 ug/dL

## 2021-12-27 LAB — HEMOGLOBIN AND HEMATOCRIT, BLOOD
HCT: 36.4 % (ref 36.0–46.0)
HCT: 29.4 % — ABNORMAL LOW (ref 36.0–46.0)
Hemoglobin: 11.6 g/dL — ABNORMAL LOW (ref 12.0–15.0)
Hemoglobin: 9.4 g/dL — ABNORMAL LOW (ref 12.0–15.0)

## 2021-12-27 LAB — FERRITIN: Ferritin: 39 ng/mL (ref 11–307)

## 2021-12-27 LAB — CBC
HCT: 40.7 % (ref 36.0–46.0)
Hemoglobin: 12.8 g/dL (ref 12.0–15.0)
MCH: 27.2 pg (ref 26.0–34.0)
MCHC: 31.4 g/dL (ref 30.0–36.0)
MCV: 86.4 fL (ref 80.0–100.0)
Platelets: 165 10*3/uL (ref 150–400)
RBC: 4.71 MIL/uL (ref 3.87–5.11)
RDW: 14.5 % (ref 11.5–15.5)
WBC: 5.4 10*3/uL (ref 4.0–10.5)
nRBC: 0 % (ref 0.0–0.2)

## 2021-12-27 LAB — ABO/RH: ABO/RH(D): B POS

## 2021-12-27 LAB — POC OCCULT BLOOD, ED: Fecal Occult Bld: POSITIVE — AB

## 2021-12-27 LAB — PROTIME-INR
INR: 1.1 (ref 0.8–1.2)
Prothrombin Time: 14.6 seconds (ref 11.4–15.2)

## 2021-12-27 LAB — GLUCOSE, CAPILLARY: Glucose-Capillary: 148 mg/dL — ABNORMAL HIGH (ref 70–99)

## 2021-12-27 LAB — VITAMIN B12: Vitamin B-12: 630 pg/mL (ref 180–914)

## 2021-12-27 MED ORDER — PANTOPRAZOLE SODIUM 40 MG IV SOLR
40.0000 mg | Freq: Once | INTRAVENOUS | Status: DC
  Filled 2021-12-27: qty 10

## 2021-12-27 MED ORDER — SODIUM CHLORIDE 0.9 % IV BOLUS
250.0000 mL | Freq: Once | INTRAVENOUS | Status: AC
Start: 2021-12-27 — End: 2021-12-28
  Administered 2021-12-27: 250 mL via INTRAVENOUS

## 2021-12-27 MED ORDER — LACTATED RINGERS IV SOLN: INTRAVENOUS | Status: DC

## 2021-12-27 MED ORDER — ONDANSETRON HCL 4 MG PO TABS: 4.0000 mg | ORAL_TABLET | Freq: Four times a day (QID) | ORAL | Status: DC | PRN

## 2021-12-27 MED ORDER — TRAMADOL HCL 50 MG PO TABS: 50.0000 mg | ORAL_TABLET | Freq: Three times a day (TID) | ORAL | Status: DC | PRN

## 2021-12-27 MED ORDER — PANTOPRAZOLE SODIUM 40 MG IV SOLR
40.0000 mg | Freq: Two times a day (BID) | INTRAVENOUS | Status: DC
  Administered 2021-12-27 – 2021-12-30 (×5): 40 mg via INTRAVENOUS
  Filled 2021-12-27 (×4): qty 10

## 2021-12-27 MED ORDER — ONDANSETRON HCL 4 MG/2ML IJ SOLN: 4.0000 mg | Freq: Four times a day (QID) | INTRAMUSCULAR | Status: DC | PRN

## 2021-12-27 NOTE — ED Provider Notes (Signed)
[facility] DEPT Provider Note   CSN: [id number] Arrival date & time: 12/27/21  7106     History  Chief Complaint  Patient presents with   Rectal Bleeding      Michaela Baker is a 71 y.o. female. With pmh CHF, HTN, MGUS, obesity, hypothyroidism on ASA no AC presents with BRBPR.  Patient says she is here with bright red blood per rectum that began at 2 AM this morning.  She woke up feeling like she needed to have a bowel movement but only had bright red blood in the commode not mixed with stool requiring 4 times flushing to clear the commode.  She has felt slightly short of breath and lightheaded and general weakness upon standing and walking.  However, no chest pain, no abdominal pain, no pain with bowel movements, no nausea, vomiting, no unintentional weight loss.  She has had 2 further bright red blood per rectum episodes since being in the ER.  She has not had any syncopal episodes.  She has had only a colonoscopy more than 10 years ago.  She is on aspirin but no anticoagulation.  This has never happened in the past.   Rectal Bleeding      Home Medications Prior to Admission medications   Medication Sig Start Date End Date Taking? Authorizing Provider  amLODipine (NORVASC) 10 MG tablet Take 10 mg by mouth daily. 02/04/20   [provider]  aspirin EC 81 MG EC tablet Take 1 tablet (81 mg total) by mouth daily. 07/26/13   Caren Griffins, MD  dapagliflozin propanediol (FARXIGA) 10 MG TABS tablet Take 10 mg by mouth daily.    [provider]  furosemide (LASIX) 40 MG tablet Take 40 mg by mouth daily.    [provider]  levothyroxine (SYNTHROID, LEVOTHROID) 25 MCG tablet Take 25 mcg by mouth daily. 02/05/15   [provider]  metoprolol succinate (TOPROL-XL) 50 MG 24 hr tablet Take 50 mg by mouth daily. Take with or immediately following a meal.    [provider]  Multiple Vitamins-Minerals (MULTIVITAMIN WITH  MINERALS) tablet Take 1 tablet by mouth daily.    [provider]  mupirocin ointment (BACTROBAN) 2 % Apply 1 application to affected area topically 2 times daily. 06/15/21   Hyatt, Max T, DPM  Omega-3 Fatty Acids (FISH OIL) 1000 MG CAPS Take 1,000 mg by mouth daily.    [provider]  potassium chloride (K-DUR,KLOR-CON) 10 MEQ tablet Take 20 mEq by mouth daily. 10/29/16   [provider]  rosuvastatin (CRESTOR) 5 MG tablet Take 5 mg by mouth daily. 05/07/21   [provider]  thiamine 100 MG tablet Take 100 mg by mouth daily.    [provider]      Allergies    Patient has no known allergies.    Review of Systems   Review of Systems  Gastrointestinal:  Positive for hematochezia.    Physical Exam Updated Vital Signs BP 119/71   Pulse 68   Temp 98.5 F (36.9 C) (Oral)   Resp 18   Ht '5\' 5"'$  (1.651 m)   Wt 98.4 kg   SpO2 96%   BMI 36.11 kg/m  Physical Exam Constitutional: Alert and oriented. Well appearing and in no distress. Eyes: Conjunctivae are normal. ENT      Head: Normocephalic and atraumatic.      Nose: No congestion.      Mouth/Throat: Mucous membranes are moist.  Neck: No stridor. Cardiovascular: S1, S2,  Normal and symmetric distal pulses are present in all extremities.Warm and well perfused. Respiratory: Normal respiratory effort. Breath sounds are normal. Gastrointestinal: Soft and nontender. There is no CVA tenderness.  Rectal region with no external hemorrhoids, no pain on rectal exam but appreciated dark red clots mixed with bright red blood per rectum with positive Hemoccult. Musculoskeletal: Normal range of motion in all extremities. Neurologic: Normal speech and language. No gross focal neurologic deficits are appreciated. Skin: Skin is warm, dry and intact. No rash noted. Psychiatric: Mood and affect are normal. Speech and behavior are normal.   ED Results / Procedures / Treatments   Labs (all labs ordered  are listed, but only abnormal results are displayed) Labs Reviewed  COMPREHENSIVE METABOLIC PANEL - Abnormal; Notable for the following components:      Result Value   Glucose, Bld 156 (*)    BUN 29 (*)    Creatinine, Ser 1.34 (*)    Calcium 8.8 (*)    Albumin 3.0 (*)    GFR, Estimated 42 (*)    All other components within normal limits  HEMOGLOBIN AND HEMATOCRIT, BLOOD - Abnormal; Notable for the following components:   Hemoglobin 11.6 (*)    All other components within normal limits  POC OCCULT BLOOD, ED - Abnormal; Notable for the following components:   Fecal Occult Bld POSITIVE (*)    All other components within normal limits  CBC  PROTIME-INR  VITAMIN B12  FOLATE  IRON AND TIBC  FERRITIN  RETICULOCYTES  HEMOGLOBIN AND HEMATOCRIT, BLOOD  HEMOGLOBIN AND HEMATOCRIT, BLOOD  TYPE AND SCREEN  ABO/RH    EKG None  Radiology No results found.  Procedures Procedures  Remain on cardiac monitoring, normal sinus rhythm  Medications Ordered in ED Medications  pantoprazole (PROTONIX) injection 40 mg (has no administration in time range)  lactated ringers infusion (has no administration in time range)  pantoprazole (PROTONIX) injection 40 mg (has no administration in time range)    ED Course/ Medical Decision Making/ A&P Clinical Course as of 12/27/21 1830  Mon Dec 27, 2021  1646 Spoke with Dr Rosalie Gums of GI who says they will evaluate patient tomorrow morning for consult service to determine if she needs inpatient or outpatient procedure.  They recommend liquid diet and serial H&H.  Will consult hospitalist for admission. [VB]  4270 As noted in MDM, initial hemoglobin stable 12.8 with elevated BUN 29 and creatinine 1.34.  Repeat H&H pending.  Discussed case with hospitalist who will put in orders for admission. [VB]    Clinical Course User Index [VB] Elgie Congo, MD                           Medical Decision Making Michaela Baker is a 71 y.o. female. With pmh  CHF, HTN, MGUS, obesity, hypothyroidism on ASA no AC presents with BRBPR.  Patient's initial vital signs remarkable for softer blood pressure 99/63 down from baseline blood pressures with no tachycardia otherwise resting comfortably.  On physical exam, no abdominal pain but appreciated dark clots mixed with bright red blood per rectum on physical exam with no evidence of external hemorrhoids and no pain on rectal exam. Hemoccult grossly positive.  Patient with persistent bright red blood per rectum and persistent and on rectal exam.  No appreciated external hemorrhoids and no pain on rectal exam.  Suspect most likely lower GI bleed likely diverticular based on age.  Has had no recent colonoscopies, no weight loss fevers or symptoms suggestive of underlying cancer but possible source.  Also consider internal hemorrhoids.  Would consider upper GI bleed although no upper abdominal pain, nausea or symptoms suggestive of upper GI bleed but will cover with 1 dose of IV Protonix.  Concern for persistent rectal bleeding and softer blood pressures upon evaluation, initial hemoglobin was stable 12.8 with elevated BUN and creatinine which could suggest possible upper GI bleed or just prerenal AKI from bleeding and decreased p.o. intake.  Will consult GI as well as hospitalist for admission for serial H&H.  No transfusion required at this time.  No anticoagulation requiring reversal.  Discussion of Management with other Physicians, QHP or Appropriate Source: Hospitalist admission.GI Dr Rosalie Gums recommends serrial H/Hiquid diet and will evaluate tomorrow. Independent Interpretation of Studies: n/a External Records Reviewed: n/a Escalation of Care, Consideration of Admission/Observation/Transfer: Admit Social determinants that significantly affected care: none Prescription drug(s) considered but not prescribed: none Diagnostic tests considered but not performed: NO CTAP with benign exam, no pain History obtained  from other sources: none    Amount and/or Complexity of Data Reviewed Labs: ordered. Decision-making details documented in ED Course.  Risk Prescription drug management. Decision regarding hospitalization.     Final Clinical Impression(s) / ED Diagnoses Final diagnoses:  Lower GI bleed  AKI (acute kidney injury) [facility])    Rx / DC Orders ED Discharge Orders     None         Elgie Congo, MD 12/27/21 1830

## 2021-12-27 NOTE — ED Provider Triage Note (Signed)
Emergency Medicine Provider Triage Evaluation Note  Michaela Baker , a 71 y.o. female  was evaluated in triage.  Pt complains of bloody stool.  At 2am went to have a BM and passed clots without bowel movement, 3-4 episodes.  No prior similar sxs.  No blood thinners  Review of Systems  Positive: Bloody stool, weakness Negative: Abdominal pain, fever  Physical Exam  BP 112/69 (BP Location: Left Arm)   Pulse 65   Temp 97.7 F (36.5 C) (Oral)   Resp 18   Ht '5\' 5"'$  (1.651 m)   Wt 98.4 kg   SpO2 95%   BMI 36.11 kg/m  Gen:   Awake, no distress   Resp:  Normal effort  MSK:   Moves extremities without difficulty  Other:  Abdomen soft/nontender  Medical Decision Making  Medically screening exam initiated at 5:40 AM.  Appropriate orders placed.  Michaela Baker was informed that the remainder of the evaluation will be completed by another provider, this initial triage assessment does not replace that evaluation, and the importance of remaining in the ED until their evaluation is complete.     Quintella Reichert, MD 12/27/21 [phone]

## 2021-12-27 NOTE — ED Triage Notes (Signed)
Patient said this morning when she went to have a bowel movement there was blood in her stool. "I had to flush 4 times." She said it has never happened before. No abdominal pain.

## 2021-12-27 NOTE — H&P (Addendum)
History and Physical  Michaela Baker:[id number] DOB: Apr 02, 1951 DOA: 12/27/2021  PCP: [name], FNP Patient coming from:   I have personally briefly reviewed patient's old medical records in [place]   Chief Complaint: Bright red blood per rectum  HPI: Michaela Baker is a 71 y.o. female past medical history significant for hypertension, high Po thyroidism, obesity, PVC, recent echo 2021 with normal ejection fraction and normal diastolic dysfunction, Varicose vein, presents complaining of bright red blood per rectum that  started the morning of admission.  She reporter four episodes  of bloody stool at home, large amount, with blood clots.  She also had 2 more episodes in the ED large amount as well.  Last bloody bowel movement was at 5 AM in the ED.  She denies abdominal pain, nausea, vomiting.  She denies prior episodes of bleeding.  Her last colonoscopy was more than 40 years ago.  She had a Cologuard which was negative.   Evaluation in the ED: Sodium 137, glucose 156, BUN 29, creatinine 1.3, albumin 3.0, hemoglobin 12, white blood cell 5.4, glucose 156, occult blood positive.   Review of Systems: All systems reviewed and apart from history of presenting illness, are negative.  Past Medical History:  Diagnosis Date   CHF (congestive heart failure) ([place])    Hypertension    Hypothyroidism 03/29/2017   Obesity (BMI 30-39.9)    Premature ventricular contractions    Past Surgical History:  Procedure Laterality Date   ABDOMINAL HYSTERECTOMY     CHOLECYSTECTOMY     TONSILLECTOMY     TUBAL LIGATION     Social History:  reports that she has never smoked. She has never used smokeless tobacco. She reports that she does not drink alcohol and does not use drugs.   No Known Allergies  Family History  Problem Relation Age of Onset   Diabetes Mother    Heart failure Mother    Kidney failure Mother    Kidney failure Sister    Diabetes Sister    Heart attack  Sister    Breast cancer Neg Hx     Prior to Admission medications   Medication Sig Start Date End Date Taking? Authorizing Provider  amLODipine (NORVASC) 10 MG tablet Take 10 mg by mouth daily. 02/04/20   [provider]  aspirin EC 81 MG EC tablet Take 1 tablet (81 mg total) by mouth daily. 07/26/13   Caren Griffins, MD  dapagliflozin propanediol (FARXIGA) 10 MG TABS tablet Take 10 mg by mouth daily.    [provider]  furosemide (LASIX) 40 MG tablet Take 40 mg by mouth daily.    [provider]  levothyroxine (SYNTHROID, LEVOTHROID) 25 MCG tablet Take 25 mcg by mouth daily. 02/05/15   [provider]  metoprolol succinate (TOPROL-XL) 50 MG 24 hr tablet Take 50 mg by mouth daily. Take with or immediately following a meal.    [provider]  Multiple Vitamins-Minerals (MULTIVITAMIN WITH MINERALS) tablet Take 1 tablet by mouth daily.    [provider]  mupirocin ointment (BACTROBAN) 2 % Apply 1 application to affected area topically 2 times daily. 06/15/21   Hyatt, Max T, DPM  Omega-3 Fatty Acids (FISH OIL) 1000 MG CAPS Take 1,000 mg by mouth daily.    [provider]  potassium chloride (K-DUR,KLOR-CON) 10 MEQ tablet Take 20 mEq by mouth daily. 10/29/16   [provider]  rosuvastatin (CRESTOR) 5 MG tablet Take 5  mg by mouth daily. 05/07/21   [provider]  thiamine 100 MG tablet Take 100 mg by mouth daily.    [provider]   Physical Exam: Vitals:   12/27/21 0532 12/27/21 1009 12/27/21 1600 12/27/21 1638  BP: 112/69 99/63 119/71   Pulse: 65 66 68   Resp: '18 18 18   '$ Temp: 97.7 F (36.5 C)   98.5 F (36.9 C)  TempSrc: Oral   Oral  SpO2: 95% 95% 96%   Weight: 98.4 kg     Height: '5\' 5"'$  (1.651 m)       General exam: Moderately built and nourished patient, lying comfortably supine on the gurney in no obvious distress. Head, eyes and ENT: Nontraumatic and normocephalic. Pupils equally reacting to  light and accommodation. Oral mucosa moist. Neck: Supple. No JVD, carotid bruit or thyromegaly. Lymphatics: No lymphadenopathy. Respiratory system: Clear to auscultation. No increased work of breathing. Cardiovascular system: S1 and S2 heard, RRR. No JVD, murmurs, gallops, clicks or pedal edema. Gastrointestinal system: Abdomen is nondistended, soft and nontender. Normal bowel sounds heard. No organomegaly or masses appreciated. Central nervous system: Alert and oriented. No focal neurological deficits. Extremities: Symmetric 5 x 5 power. Peripheral pulses symmetrically felt.  Skin: No rashes or acute findings. Musculoskeletal system: Negative exam. Psychiatry: Pleasant and cooperative.   Labs on Admission:  Basic Metabolic Panel: Recent Labs  Lab 12/27/21 0534  NA 137  K 3.8  CL 107  CO2 23  GLUCOSE 156*  BUN 29*  CREATININE 1.34*  CALCIUM 8.8*   Liver Function Tests: Recent Labs  Lab 12/27/21 0534  AST 17  ALT 13  ALKPHOS 56  BILITOT 0.5  PROT 7.9  ALBUMIN 3.0*   No results for input(s): "LIPASE", "AMYLASE" in the last 168 hours. No results for input(s): "AMMONIA" in the last 168 hours. CBC: Recent Labs  Lab 12/27/21 0534  WBC 5.4  HGB 12.8  HCT 40.7  MCV 86.4  PLT 165   Cardiac Enzymes: No results for input(s): "CKTOTAL", "CKMB", "CKMBINDEX", "TROPONINI" in the last 168 hours.  BNP (last 3 results) No results for input(s): "PROBNP" in the last 8760 hours. CBG: No results for input(s): "GLUCAP" in the last 168 hours.  Radiological Exams on Admission: No results found.    Assessment/Plan Principal Problem:   GI bleed Active Problems:   Hypertension   Obesity (BMI 30-39.9)   Premature ventricular contractions   1-GI Bleed/Hematochezia Patient presented with bright red blood per rectum, systolic blood pressure in the high 90s. Initial hemoglobin at 12.  Of note hemoglobin 9 months ago 9.6----11. Cycle hb.  IV fluids.  IV Protonix.  Dx;  diverticular bleed, cs hemorrhoids, vs AVM.  GI consulted. [facility] liquid diet.  Transfusion as needed.   2-AKI;  Cr baseline 0.8 Presents with Cr at 1.3 Suspect hemodynamic mediated.  IV fluids.  Hold diuretics, Lasix.  3-Hypothyroidism: Resume Synthroid 4-Hypertension: Hold metoprolol and Lasix due to GI bleed Venous Insufficiency. Ulceration plantar aspect right heel;  Follow with vascular.  Wound care consulted.   Obesity: Need lifestyle modification    DVT Prophylaxis: SCD Code Status: Full code Family Communication: Care discussed with family.   Disposition Plan: admit under observation for management of GI bleed.   Time spent: 75 Minutes.   Elmarie Shiley MD [facility]   12/27/2021, 5:46 PM

## 2021-12-27 NOTE — Progress Notes (Signed)
Pt experienced syncopal episode in bathroom.  Rapid called and came to bedside.  Patient transferred to chair.  Vitals taken, WDL.  Pt had bloody stool at time of syncopal episode.   Pt transferred to bed and MD notified.  RN will continue to monitor.

## 2021-12-28 DIAGNOSIS — Z79899 Other long term (current) drug therapy: Secondary | ICD-10-CM | POA: Diagnosis not present

## 2021-12-28 DIAGNOSIS — L97519 Non-pressure chronic ulcer of other part of right foot with unspecified severity: Secondary | ICD-10-CM | POA: Diagnosis present

## 2021-12-28 DIAGNOSIS — I872 Venous insufficiency (chronic) (peripheral): Secondary | ICD-10-CM | POA: Diagnosis present

## 2021-12-28 DIAGNOSIS — Z841 Family history of disorders of kidney and ureter: Secondary | ICD-10-CM | POA: Diagnosis not present

## 2021-12-28 DIAGNOSIS — K625 Hemorrhage of anus and rectum: Secondary | ICD-10-CM | POA: Diagnosis not present

## 2021-12-28 DIAGNOSIS — D62 Acute posthemorrhagic anemia: Secondary | ICD-10-CM | POA: Diagnosis present

## 2021-12-28 DIAGNOSIS — K5731 Diverticulosis of large intestine without perforation or abscess with bleeding: Secondary | ICD-10-CM | POA: Diagnosis present

## 2021-12-28 DIAGNOSIS — I493 Ventricular premature depolarization: Secondary | ICD-10-CM | POA: Diagnosis present

## 2021-12-28 DIAGNOSIS — Z6836 Body mass index (BMI) 36.0-36.9, adult: Secondary | ICD-10-CM | POA: Diagnosis not present

## 2021-12-28 DIAGNOSIS — Z7982 Long term (current) use of aspirin: Secondary | ICD-10-CM | POA: Diagnosis not present

## 2021-12-28 DIAGNOSIS — E669 Obesity, unspecified: Secondary | ICD-10-CM | POA: Diagnosis present

## 2021-12-28 DIAGNOSIS — I10 Essential (primary) hypertension: Secondary | ICD-10-CM | POA: Diagnosis present

## 2021-12-28 DIAGNOSIS — R55 Syncope and collapse: Secondary | ICD-10-CM | POA: Diagnosis not present

## 2021-12-28 DIAGNOSIS — D472 Monoclonal gammopathy: Secondary | ICD-10-CM | POA: Diagnosis present

## 2021-12-28 DIAGNOSIS — K922 Gastrointestinal hemorrhage, unspecified: Secondary | ICD-10-CM | POA: Diagnosis present

## 2021-12-28 DIAGNOSIS — L03115 Cellulitis of right lower limb: Secondary | ICD-10-CM | POA: Diagnosis present

## 2021-12-28 DIAGNOSIS — K5730 Diverticulosis of large intestine without perforation or abscess without bleeding: Secondary | ICD-10-CM | POA: Diagnosis not present

## 2021-12-28 DIAGNOSIS — E039 Hypothyroidism, unspecified: Secondary | ICD-10-CM | POA: Diagnosis present

## 2021-12-28 DIAGNOSIS — Z79890 Hormone replacement therapy (postmenopausal): Secondary | ICD-10-CM | POA: Diagnosis not present

## 2021-12-28 DIAGNOSIS — Z90710 Acquired absence of both cervix and uterus: Secondary | ICD-10-CM | POA: Diagnosis not present

## 2021-12-28 DIAGNOSIS — K644 Residual hemorrhoidal skin tags: Secondary | ICD-10-CM | POA: Diagnosis present

## 2021-12-28 DIAGNOSIS — R509 Fever, unspecified: Secondary | ICD-10-CM | POA: Diagnosis not present

## 2021-12-28 DIAGNOSIS — N179 Acute kidney failure, unspecified: Secondary | ICD-10-CM | POA: Diagnosis present

## 2021-12-28 DIAGNOSIS — Z9049 Acquired absence of other specified parts of digestive tract: Secondary | ICD-10-CM | POA: Diagnosis not present

## 2021-12-28 LAB — PREPARE RBC (CROSSMATCH)

## 2021-12-28 LAB — HEMOGLOBIN AND HEMATOCRIT, BLOOD
HCT: 27.1 % — ABNORMAL LOW (ref 36.0–46.0)
HCT: 28.9 % — ABNORMAL LOW (ref 36.0–46.0)
HCT: 27.1 % — ABNORMAL LOW (ref 36.0–46.0)
Hemoglobin: 8.5 g/dL — ABNORMAL LOW (ref 12.0–15.0)
Hemoglobin: 9.4 g/dL — ABNORMAL LOW (ref 12.0–15.0)
Hemoglobin: 8.8 g/dL — ABNORMAL LOW (ref 12.0–15.0)

## 2021-12-28 LAB — FOLATE: Folate: 27.9 ng/mL (ref >5.9)

## 2021-12-28 MED ORDER — SODIUM CHLORIDE 0.9% IV SOLUTION: Freq: Once | INTRAVENOUS | Status: DC

## 2021-12-28 MED ORDER — PEG 3350-KCL-NA BICARB-NACL 420 G PO SOLR
4000.0000 mL | Freq: Once | ORAL | Status: AC
  Administered 2021-12-28: 4000 mL via ORAL

## 2021-12-28 MED ORDER — MUPIROCIN 2 % EX OINT
TOPICAL_OINTMENT | Freq: Two times a day (BID) | CUTANEOUS | Status: DC
  Administered 2021-12-30 – 2021-12-31 (×2): 1 via TOPICAL
  Filled 2021-12-28: qty 22

## 2021-12-28 NOTE — Progress Notes (Addendum)
PROGRESS NOTE    Michaela Baker  UUV:[id number] DOB: Mar 31, 1951 DOA: 12/27/2021 PCP: [name], FNP   Brief Narrative: Michaela Baker is a 71 y.o. female past medical history significant for hypertension, Hypothyroidism, obesity, PVC, recent echo 2021 with normal ejection fraction and normal diastolic dysfunction, Varicose vein, presents complaining of bright red blood per rectum that  started the morning of admission.  She reporter four episodes  of bloody stool at home, large amount, with blood clots.  She also had 2 more episodes in the ED large amount as well.  Last bloody bowel movement was at 5 AM in the ED.  She denies abdominal pain, nausea, vomiting.  She denies prior episodes of bleeding.  Her last colonoscopy was more than 40 years ago.  She had a Cologuard which was negative.    Evaluation in the ED: Sodium 137, glucose 156, BUN 29, creatinine 1.3, albumin 3.0, hemoglobin 12, white blood cell 5.4, glucose 156, occult blood positive.     Assessment & Plan:   Principal Problem:   GI bleed Active Problems:   Hypertension   Obesity (BMI 30-39.9)   Premature ventricular contractions    1-GI Bleed/Hematochezia Patient presented with bright red blood per rectum, systolic blood pressure in the high 90s. Initial hemoglobin at 12.  Of note hemoglobin 9 months ago 9.6----11. Cycle hb.  IV fluids.  IV Protonix.  Dx; diverticular bleed, cs hemorrhoids, vs AVM.  GI consulted. [facility]. Plan for Colonoscopy tomorrow.  Clear liquid diet.  Hb drop to 8.5, she had syncope episode last night. One unit PRBC ordered.    2-AKI;  Cr baseline 0.8 Presents with Cr at 1.3 Suspect hemodynamic mediated.  IV fluids.  Hold diuretics, Lasix.   3-Hypothyroidism: Continue with  Synthroid 4-Hypertension: Hold metoprolol and Lasix due to GI bleed  Syncope:  Had syncope last night in the hospital.  Suspect hemodynamic mediated.  Blood transfusion, IV fluids.   Venous Insufficiency.  Ulceration plantar aspect right heel;  Follow with vascular.  Wound care consulted.    Obesity: Need lifestyle modification    Estimated body mass index is 36.11 kg/m as calculated from the following:   Height as of this encounter: '5\' 5"'$  (1.651 m).   Weight as of this encounter: 98.4 kg.   DVT prophylaxis: SCD Code Status: Full code Family Communication: Care discussed with son who was at bedside.  Disposition Plan:  Status is: Observation The patient will require care spanning > 2 midnights and should be moved to inpatient because: syncope, GI bleed.     Consultants:  GI  Procedures:    Antimicrobials:    Subjective: She is alert, she had syncope episode last night.  She denies abdominal pain. Last Bloody BM last night at time of syncope.    Objective: Vitals:   12/27/21 1638 12/27/21 1851 12/27/21 1903 12/28/21 0011  BP:  113/66 (!) 131/56 (!) 99/43  Pulse:  75 87 75  Resp:  18  18  Temp: 98.5 F (36.9 C) 98.7 F (37.1 C) 98.5 F (36.9 C) 97.7 F (36.5 C)  TempSrc: Oral Oral Oral Oral  SpO2:  96% 99% 94%  Weight:      Height:       No intake or output data in the 24 hours ending 12/28/21 0705 Filed Weights   12/27/21 0532  Weight: 98.4 kg    Examination:  General exam: Appears calm and comfortable  Respiratory system: Clear to auscultation. Respiratory effort normal. Cardiovascular  system: S1 & S2 heard, RRR. No JVD, murmurs, rubs, gallops or clicks. No pedal edema. Gastrointestinal system: Abdomen is nondistended, soft and nontender. No organomegaly or masses felt. Normal bowel sounds heard. Central nervous system: Alert and oriented. No focal neurological deficits. Extremities: Symmetric 5 x 5 power.   Data Reviewed: I have personally reviewed following labs and imaging studies  CBC: Recent Labs  Lab 12/27/21 0534 12/27/21 1800 12/27/21 2053 12/28/21 0552  WBC 5.4  --   --   --   HGB 12.8 11.6* 9.4* 8.5*  HCT 40.7 36.4 29.4* 27.1*   MCV 86.4  --   --   --   PLT 165  --   --   --    Basic Metabolic Panel: Recent Labs  Lab 12/27/21 0534  NA 137  K 3.8  CL 107  CO2 23  GLUCOSE 156*  BUN 29*  CREATININE 1.34*  CALCIUM 8.8*   GFR: Estimated Creatinine Clearance: 44.7 mL/min (A) (by C-G formula based on SCr of 1.34 mg/dL (H)). Liver Function Tests: Recent Labs  Lab 12/27/21 0534  AST 17  ALT 13  ALKPHOS 56  BILITOT 0.5  PROT 7.9  ALBUMIN 3.0*   No results for input(s): "LIPASE", "AMYLASE" in the last 168 hours. No results for input(s): "AMMONIA" in the last 168 hours. Coagulation Profile: Recent Labs  Lab 12/27/21 2053  INR 1.1   Cardiac Enzymes: No results for input(s): "CKTOTAL", "CKMB", "CKMBINDEX", "TROPONINI" in the last 168 hours. BNP (last 3 results) No results for input(s): "PROBNP" in the last 8760 hours. HbA1C: No results for input(s): "HGBA1C" in the last 72 hours. CBG: Recent Labs  Lab 12/27/21 2154  GLUCAP 148*   Lipid Profile: No results for input(s): "CHOL", "HDL", "LDLCALC", "TRIG", "CHOLHDL", "LDLDIRECT" in the last 72 hours. Thyroid Function Tests: No results for input(s): "TSH", "T4TOTAL", "FREET4", "T3FREE", "THYROIDAB" in the last 72 hours. Anemia Panel: Recent Labs    12/27/21 1800 12/27/21 2053  VITAMINB12  --  630  FOLATE  --  27.9  FERRITIN  --  39  TIBC  --  217*  IRON  --  38  RETICCTPCT 1.6  --    Sepsis Labs: No results for input(s): "PROCALCITON", "LATICACIDVEN" in the last 168 hours.  No results found for this or any previous visit (from the past 240 hour(s)).       Radiology Studies: No results found.      Scheduled Meds:  sodium chloride   Intravenous Once   pantoprazole (PROTONIX) IV  40 mg Intravenous Once   pantoprazole (PROTONIX) IV  40 mg Intravenous Q12H   Continuous Infusions:  lactated ringers 100 mL/hr at 12/27/21 2031     LOS: 0 days    Time spent: 35 minutes    Diania Co A Lindia Garms, MD [facility]   If  7PM-7AM, please contact night-coverage [email or web address]  12/28/2021, 7:05 AM

## 2021-12-28 NOTE — H&P (View-Only) (Signed)
Referring Provider: Emergency room provider Primary Care Physician:  [name], FNP Primary Gastroenterologist: Althia Forts  Reason for Consultation: Rectal bleeding  HPI: Michaela Baker is a 71 y.o. female with past medical history of CHF with recent echocardiogram showing normal EF, history of hypertension, chronic anemia and hypothyroidism presented to the hospital with rectal bleeding.  Rectal bleeding started yesterday morning.  Upon initial evaluation yesterday, she was found to have normal CBC, mildly elevated creatinine at 1.34, normal LFTs, occult blood positive, normal INR, normal vitamin B12 and folate as well as normal iron studies and ferritin.  Patient's hemoglobin dropped to 8.5 this morning.  Review of record suggested patient's hemoglobin was around 9.6 in October 2022.  I believe reading of 12.8 on initial evaluation could be false high because of hemoconcentration.  Patient seen and examined at bedside.  She denies any previous bleeding episodes.  Denies significant NSAID use.  Denies associated abdominal pain, nausea or vomiting.  Last episode of bleeding was this morning.  Bleeding has slowed down according to patient.  ? Family history of colon cancer in sister.  Past Medical History:  Diagnosis Date   CHF (congestive heart failure) ([place])    Hypertension    Hypothyroidism 03/29/2017   Obesity (BMI 30-39.9)    Premature ventricular contractions     Past Surgical History:  Procedure Laterality Date   ABDOMINAL HYSTERECTOMY     CHOLECYSTECTOMY     TONSILLECTOMY     TUBAL LIGATION      Prior to Admission medications   Medication Sig Start Date End Date Taking? Authorizing Provider  acetaminophen (TYLENOL) 500 MG tablet Take 1,000 mg by mouth as needed (pain).   Yes [provider]  amLODipine (NORVASC) 10 MG tablet Take 10 mg by mouth daily. 02/04/20  Yes [provider]  aspirin EC 81 MG EC tablet Take 1 tablet (81 mg total) by mouth daily.  07/26/13  Yes Caren Griffins, MD  dapagliflozin propanediol (FARXIGA) 10 MG TABS tablet Take 10 mg by mouth daily.   Yes [provider]  furosemide (LASIX) 40 MG tablet Take 40 mg by mouth daily.   Yes [provider]  levothyroxine (SYNTHROID, LEVOTHROID) 25 MCG tablet Take 25 mcg by mouth daily. 02/05/15  Yes [provider]  metoprolol succinate (TOPROL-XL) 50 MG 24 hr tablet Take 50 mg by mouth daily. Take with or immediately following a meal.   Yes [provider]  Multiple Vitamins-Minerals (MULTIVITAMIN WITH MINERALS) tablet Take 2 tablets by mouth daily.   Yes [provider]  mupirocin ointment (BACTROBAN) 2 % Apply 1 application to affected area topically 2 times daily. Patient taking differently: Apply 1 Application topically 2 (two) times daily. To foot 06/15/21  Yes Hyatt, Max T, DPM  naproxen sodium (ALEVE) 220 MG tablet Take 440 mg by mouth as needed (pain).   Yes [provider]  Omega-3 Fatty Acids (FISH OIL) 1000 MG CAPS Take 2,000 mg by mouth daily.   Yes [provider]  potassium chloride SA (KLOR-CON M) 20 MEQ tablet Take 20 mEq by mouth daily.   Yes [provider]  rosuvastatin (CRESTOR) 5 MG tablet Take 5 mg by mouth daily. 05/07/21  Yes [provider]  potassium chloride (K-DUR,KLOR-CON) 10 MEQ tablet Take 20 mEq by mouth daily. Patient not taking: Reported on 12/28/2021 10/29/16   [provider]  thiamine 100 MG tablet Take 100 mg by mouth daily. Patient not taking: Reported on 12/28/2021  [provider]    Scheduled Meds:  sodium chloride   Intravenous Once   mupirocin ointment   Topical BID   pantoprazole (PROTONIX) IV  40 mg Intravenous Once   pantoprazole (PROTONIX) IV  40 mg Intravenous Q12H   Continuous Infusions:  lactated ringers 100 mL/hr at 12/27/21 2031   PRN Meds:.ondansetron **OR** ondansetron (ZOFRAN) IV, traMADol  Allergies as of 12/27/2021   (No  Known Allergies)    Family History  Problem Relation Age of Onset   Diabetes Mother    Heart failure Mother    Kidney failure Mother    Kidney failure Sister    Diabetes Sister    Heart attack Sister    Breast cancer Neg Hx     Social History   Socioeconomic History   Marital status: Married    Spouse name: Not on file   Number of children: 2   Years of education: Not on file   Highest education level: Not on file  Occupational History   Not on file  Tobacco Use   Smoking status: Never   Smokeless tobacco: Never  Vaping Use   Vaping Use: Never used  Substance and Sexual Activity   Alcohol use: No   Drug use: No   Sexual activity: Never  Other Topics Concern   Not on file  Social History Narrative   Not on file   Social Determinants of Health   Financial Resource Strain: Not on file  Food Insecurity: Not on file  Transportation Needs: Not on file  Physical Activity: Not on file  Stress: Not on file  Social Connections: Not on file  Intimate Partner Violence: Not on file    Review of Systems: All negative except as stated above in HPI.  Physical Exam: Vital signs: Vitals:   12/28/21 0913 12/28/21 0934  BP: (!) 97/43 (!) 110/53  Pulse: 85 (!) 59  Resp: 18 18  Temp: 97.7 F (36.5 C) 97.9 F (36.6 C)  SpO2: 99% 94%   Last BM Date : 12/27/21 General:   Alert,  Well-developed, well-nourished, pleasant and cooperative in NAD Lungs: No visible respiratory distress, anterior exam only Heart:  Regular rate and rhythm; no murmurs, clicks, rubs,  or gallops. Abdomen: Soft, nontender, nondistended, bowel sounds present, no peritoneal signs Rectal:  Deferred  GI:  Lab Results: Recent Labs    12/27/21 0534 12/27/21 1800 12/27/21 2053 12/28/21 0552  WBC 5.4  --   --   --   HGB 12.8 11.6* 9.4* 8.5*  HCT 40.7 36.4 29.4* 27.1*  PLT 165  --   --   --    BMET Recent Labs    12/27/21 0534  NA 137  K 3.8  CL 107  CO2 23  GLUCOSE 156*  BUN 29*   CREATININE 1.34*  CALCIUM 8.8*   LFT Recent Labs    12/27/21 0534  PROT 7.9  ALBUMIN 3.0*  AST 17  ALT 13  ALKPHOS 56  BILITOT 0.5   PT/INR Recent Labs    12/27/21 2053  LABPROT 14.6  INR 1.1     Studies/Results: No results found.  Impression/Plan: -Rectal bleeding.  No abdominal pain.  No weight loss.  Could be diverticular bleed versus hemorrhoidal bleeding. -?? Family history of colon cancer in sister  Recommendations -------------------------- -Patient and family prefers inpatient work-up.  Plan for colonoscopy tomorrow. -Okay to have clear liquids diet today.  Keep n.p.o. past midnight.  Risks (bleeding, infection, bowel perforation that could  require surgery, sedation-related changes in cardiopulmonary systems), benefits (identification and possible treatment of source of symptoms, exclusion of certain causes of symptoms), and alternatives (watchful waiting, radiographic imaging studies, empiric medical treatment)  were explained to patient/family in detail and patient wishes to proceed.    LOS: 0 days   Otis Brace  MD, FACP 12/28/2021, 11:26 AM  Contact #  [phone]

## 2021-12-28 NOTE — Consult Note (Signed)
Referring Provider: Emergency room provider Primary Care Physician:  [name], FNP Primary Gastroenterologist: Althia Forts  Reason for Consultation: Rectal bleeding  HPI: Michaela Baker is a 71 y.o. female with past medical history of CHF with recent echocardiogram showing normal EF, history of hypertension, chronic anemia and hypothyroidism presented to the hospital with rectal bleeding.  Rectal bleeding started yesterday morning.  Upon initial evaluation yesterday, she was found to have normal CBC, mildly elevated creatinine at 1.34, normal LFTs, occult blood positive, normal INR, normal vitamin B12 and folate as well as normal iron studies and ferritin.  Patient's hemoglobin dropped to 8.5 this morning.  Review of record suggested patient's hemoglobin was around 9.6 in October 2022.  I believe reading of 12.8 on initial evaluation could be false high because of hemoconcentration.  Patient seen and examined at bedside.  She denies any previous bleeding episodes.  Denies significant NSAID use.  Denies associated abdominal pain, nausea or vomiting.  Last episode of bleeding was this morning.  Bleeding has slowed down according to patient.  ? Family history of colon cancer in sister.  Past Medical History:  Diagnosis Date   CHF (congestive heart failure) ([place])    Hypertension    Hypothyroidism 03/29/2017   Obesity (BMI 30-39.9)    Premature ventricular contractions     Past Surgical History:  Procedure Laterality Date   ABDOMINAL HYSTERECTOMY     CHOLECYSTECTOMY     TONSILLECTOMY     TUBAL LIGATION      Prior to Admission medications   Medication Sig Start Date End Date Taking? Authorizing Provider  acetaminophen (TYLENOL) 500 MG tablet Take 1,000 mg by mouth as needed (pain).   Yes [provider]  amLODipine (NORVASC) 10 MG tablet Take 10 mg by mouth daily. 02/04/20  Yes [provider]  aspirin EC 81 MG EC tablet Take 1 tablet (81 mg total) by mouth daily.  07/26/13  Yes Caren Griffins, MD  dapagliflozin propanediol (FARXIGA) 10 MG TABS tablet Take 10 mg by mouth daily.   Yes [provider]  furosemide (LASIX) 40 MG tablet Take 40 mg by mouth daily.   Yes [provider]  levothyroxine (SYNTHROID, LEVOTHROID) 25 MCG tablet Take 25 mcg by mouth daily. 02/05/15  Yes [provider]  metoprolol succinate (TOPROL-XL) 50 MG 24 hr tablet Take 50 mg by mouth daily. Take with or immediately following a meal.   Yes [provider]  Multiple Vitamins-Minerals (MULTIVITAMIN WITH MINERALS) tablet Take 2 tablets by mouth daily.   Yes [provider]  mupirocin ointment (BACTROBAN) 2 % Apply 1 application to affected area topically 2 times daily. Patient taking differently: Apply 1 Application topically 2 (two) times daily. To foot 06/15/21  Yes Hyatt, Max T, DPM  naproxen sodium (ALEVE) 220 MG tablet Take 440 mg by mouth as needed (pain).   Yes [provider]  Omega-3 Fatty Acids (FISH OIL) 1000 MG CAPS Take 2,000 mg by mouth daily.   Yes [provider]  potassium chloride SA (KLOR-CON M) 20 MEQ tablet Take 20 mEq by mouth daily.   Yes [provider]  rosuvastatin (CRESTOR) 5 MG tablet Take 5 mg by mouth daily. 05/07/21  Yes [provider]  potassium chloride (K-DUR,KLOR-CON) 10 MEQ tablet Take 20 mEq by mouth daily. Patient not taking: Reported on 12/28/2021 10/29/16   [provider]  thiamine 100 MG tablet Take 100 mg by mouth daily. Patient not taking: Reported on 12/28/2021  [provider]    Scheduled Meds:  sodium chloride   Intravenous Once   mupirocin ointment   Topical BID   pantoprazole (PROTONIX) IV  40 mg Intravenous Once   pantoprazole (PROTONIX) IV  40 mg Intravenous Q12H   Continuous Infusions:  lactated ringers 100 mL/hr at 12/27/21 2031   PRN Meds:.ondansetron **OR** ondansetron (ZOFRAN) IV, traMADol  Allergies as of 12/27/2021   (No  Known Allergies)    Family History  Problem Relation Age of Onset   Diabetes Mother    Heart failure Mother    Kidney failure Mother    Kidney failure Sister    Diabetes Sister    Heart attack Sister    Breast cancer Neg Hx     Social History   Socioeconomic History   Marital status: Married    Spouse name: Not on file   Number of children: 2   Years of education: Not on file   Highest education level: Not on file  Occupational History   Not on file  Tobacco Use   Smoking status: Never   Smokeless tobacco: Never  Vaping Use   Vaping Use: Never used  Substance and Sexual Activity   Alcohol use: No   Drug use: No   Sexual activity: Never  Other Topics Concern   Not on file  Social History Narrative   Not on file   Social Determinants of Health   Financial Resource Strain: Not on file  Food Insecurity: Not on file  Transportation Needs: Not on file  Physical Activity: Not on file  Stress: Not on file  Social Connections: Not on file  Intimate Partner Violence: Not on file    Review of Systems: All negative except as stated above in HPI.  Physical Exam: Vital signs: Vitals:   12/28/21 0913 12/28/21 0934  BP: (!) 97/43 (!) 110/53  Pulse: 85 (!) 59  Resp: 18 18  Temp: 97.7 F (36.5 C) 97.9 F (36.6 C)  SpO2: 99% 94%   Last BM Date : 12/27/21 General:   Alert,  Well-developed, well-nourished, pleasant and cooperative in NAD Lungs: No visible respiratory distress, anterior exam only Heart:  Regular rate and rhythm; no murmurs, clicks, rubs,  or gallops. Abdomen: Soft, nontender, nondistended, bowel sounds present, no peritoneal signs Rectal:  Deferred  GI:  Lab Results: Recent Labs    12/27/21 0534 12/27/21 1800 12/27/21 2053 12/28/21 0552  WBC 5.4  --   --   --   HGB 12.8 11.6* 9.4* 8.5*  HCT 40.7 36.4 29.4* 27.1*  PLT 165  --   --   --    BMET Recent Labs    12/27/21 0534  NA 137  K 3.8  CL 107  CO2 23  GLUCOSE 156*  BUN 29*   CREATININE 1.34*  CALCIUM 8.8*   LFT Recent Labs    12/27/21 0534  PROT 7.9  ALBUMIN 3.0*  AST 17  ALT 13  ALKPHOS 56  BILITOT 0.5   PT/INR Recent Labs    12/27/21 2053  LABPROT 14.6  INR 1.1     Studies/Results: No results found.  Impression/Plan: -Rectal bleeding.  No abdominal pain.  No weight loss.  Could be diverticular bleed versus hemorrhoidal bleeding. -?? Family history of colon cancer in sister  Recommendations -------------------------- -Patient and family prefers inpatient work-up.  Plan for colonoscopy tomorrow. -Okay to have clear liquids diet today.  Keep n.p.o. past midnight.  Risks (bleeding, infection, bowel perforation that could  require surgery, sedation-related changes in cardiopulmonary systems), benefits (identification and possible treatment of source of symptoms, exclusion of certain causes of symptoms), and alternatives (watchful waiting, radiographic imaging studies, empiric medical treatment)  were explained to patient/family in detail and patient wishes to proceed.    LOS: 0 days   Otis Brace  MD, FACP 12/28/2021, 11:26 AM  Contact #  [phone]

## 2021-12-28 NOTE — Consult Note (Addendum)
WOC Nurse Consult Note: Patient receiving care in [place]. Three visitors present at time of my assessment of the right heel Reason for Consult: heel wound Wound type: Patient is followed by Johna Sheriff. Seen as recently as 09/23/21. This is a partial thickness wound that has been present since 2004 and is vastly improved from what it once was, per the patient. Pressure Injury POA: NA Measurement: 1 cm x 1 cm Wound bed: 100% pink, just needs to complete the re-epithelialization process across the wound bed. Drainage (amount, consistency, odor) none Periwound: intact Dressing procedure/placement/frequency: The patient explained she has all her prescriptions filled at [facility]. I called the pharmacy and learned that she has been prescribed 2% mupirocin ointment bid for the area.  My order reflects what she has been doing for the wound.  Monitor the wound area(s) for worsening of condition such as: Signs/symptoms of infection,  Increase in size,  Development of or worsening of odor, Development of pain, or increased pain at the affected locations.  Notify the medical team if any of these develop.  Thank you for the consult.  Discussed plan of care with the patient and bedside nurse.  [facility] nurse will not follow at this time.  Please re-consult the [facility] team if needed.  Val Riles, RN, MSN, CWOCN, CNS-BC, pager [phone]

## 2021-12-29 ENCOUNTER — Inpatient Hospital Stay (HOSPITAL_COMMUNITY): Payer: Medicare Other

## 2021-12-29 ENCOUNTER — Inpatient Hospital Stay (HOSPITAL_COMMUNITY): Payer: Medicare Other | Admitting: Anesthesiology

## 2021-12-29 ENCOUNTER — Encounter (HOSPITAL_COMMUNITY)
Admission: EM | Disposition: A | Discharge status: Home / Self Care | Payer: Self-pay | Source: Home / Self Care | Attending: Family Medicine

## 2021-12-29 ENCOUNTER — Encounter (HOSPITAL_COMMUNITY): Payer: Self-pay | Admitting: Internal Medicine

## 2021-12-29 DIAGNOSIS — I10 Essential (primary) hypertension: Secondary | ICD-10-CM | POA: Diagnosis not present

## 2021-12-29 DIAGNOSIS — N179 Acute kidney failure, unspecified: Secondary | ICD-10-CM

## 2021-12-29 DIAGNOSIS — K625 Hemorrhage of anus and rectum: Secondary | ICD-10-CM | POA: Diagnosis not present

## 2021-12-29 DIAGNOSIS — K922 Gastrointestinal hemorrhage, unspecified: Secondary | ICD-10-CM | POA: Diagnosis not present

## 2021-12-29 DIAGNOSIS — E039 Hypothyroidism, unspecified: Secondary | ICD-10-CM

## 2021-12-29 DIAGNOSIS — K644 Residual hemorrhoidal skin tags: Secondary | ICD-10-CM

## 2021-12-29 DIAGNOSIS — K5730 Diverticulosis of large intestine without perforation or abscess without bleeding: Secondary | ICD-10-CM

## 2021-12-29 HISTORY — PX: COLONOSCOPY WITH PROPOFOL: SHX5780

## 2021-12-29 LAB — CBC
HCT: 21.4 % — ABNORMAL LOW (ref 36.0–46.0)
HCT: 21.3 % — ABNORMAL LOW (ref 36.0–46.0)
Hemoglobin: 7 g/dL — ABNORMAL LOW (ref 12.0–15.0)
Hemoglobin: 7 g/dL — ABNORMAL LOW (ref 12.0–15.0)
MCH: 28.3 pg (ref 26.0–34.0)
MCH: 28.7 pg (ref 26.0–34.0)
MCHC: 32.7 g/dL (ref 30.0–36.0)
MCHC: 32.9 g/dL (ref 30.0–36.0)
MCV: 86.6 fL (ref 80.0–100.0)
MCV: 87.3 fL (ref 80.0–100.0)
Platelets: 106 10*3/uL — ABNORMAL LOW (ref 150–400)
Platelets: 111 10*3/uL — ABNORMAL LOW (ref 150–400)
RBC: 2.47 MIL/uL — ABNORMAL LOW (ref 3.87–5.11)
RBC: 2.44 MIL/uL — ABNORMAL LOW (ref 3.87–5.11)
RDW: 14.8 % (ref 11.5–15.5)
RDW: 15 % (ref 11.5–15.5)
WBC: 6.5 10*3/uL (ref 4.0–10.5)
WBC: 7.9 10*3/uL (ref 4.0–10.5)
nRBC: 0 % (ref 0.0–0.2)
nRBC: 0 % (ref 0.0–0.2)

## 2021-12-29 LAB — BASIC METABOLIC PANEL
Anion gap: 3 — ABNORMAL LOW (ref 5–15)
Anion gap: 3 — ABNORMAL LOW (ref 5–15)
BUN: 37 mg/dL — ABNORMAL HIGH (ref 8–23)
BUN: 25 mg/dL — ABNORMAL HIGH (ref 8–23)
CO2: 23 mmol/L (ref 22–32)
CO2: 23 mmol/L (ref 22–32)
Calcium: 7.8 mg/dL — ABNORMAL LOW (ref 8.9–10.3)
Calcium: 7.7 mg/dL — ABNORMAL LOW (ref 8.9–10.3)
Chloride: 114 mmol/L — ABNORMAL HIGH (ref 98–111)
Chloride: 113 mmol/L — ABNORMAL HIGH (ref 98–111)
Creatinine, Ser: 1.25 mg/dL — ABNORMAL HIGH (ref 0.44–1.00)
Creatinine, Ser: 1.03 mg/dL — ABNORMAL HIGH (ref 0.44–1.00)
GFR, Estimated: 46 mL/min — ABNORMAL LOW (ref >60)
GFR, Estimated: 58 mL/min — ABNORMAL LOW (ref >60)
Glucose, Bld: 92 mg/dL (ref 70–99)
Glucose, Bld: 113 mg/dL — ABNORMAL HIGH (ref 70–99)
Potassium: 4.3 mmol/L (ref 3.5–5.1)
Potassium: 3.9 mmol/L (ref 3.5–5.1)
Sodium: 140 mmol/L (ref 135–145)
Sodium: 139 mmol/L (ref 135–145)

## 2021-12-29 LAB — HEMOGLOBIN AND HEMATOCRIT, BLOOD
HCT: 21.6 % — ABNORMAL LOW (ref 36.0–46.0)
HCT: 22.7 % — ABNORMAL LOW (ref 36.0–46.0)
HCT: 25.2 % — ABNORMAL LOW (ref 36.0–46.0)
Hemoglobin: 7 g/dL — ABNORMAL LOW (ref 12.0–15.0)
Hemoglobin: 7.3 g/dL — ABNORMAL LOW (ref 12.0–15.0)
Hemoglobin: 8.5 g/dL — ABNORMAL LOW (ref 12.0–15.0)

## 2021-12-29 LAB — PREPARE RBC (CROSSMATCH)

## 2021-12-29 SURGERY — COLONOSCOPY WITH PROPOFOL
Anesthesia: Monitor Anesthesia Care

## 2021-12-29 MED ORDER — LACTATED RINGERS IV SOLN: INTRAVENOUS | Status: DC

## 2021-12-29 MED ORDER — PROPOFOL 10 MG/ML IV BOLUS
INTRAVENOUS | Status: DC | PRN
  Administered 2021-12-29: 100 ug/kg/min via INTRAVENOUS

## 2021-12-29 MED ORDER — IOHEXOL 350 MG/ML SOLN
100.0000 mL | Freq: Once | INTRAVENOUS | Status: AC | PRN
  Administered 2021-12-29: 100 mL via INTRAVENOUS

## 2021-12-29 MED ORDER — PHENYLEPHRINE 80 MCG/ML (10ML) SYRINGE FOR IV PUSH (FOR BLOOD PRESSURE SUPPORT)
PREFILLED_SYRINGE | INTRAVENOUS | Status: DC | PRN
  Administered 2021-12-29: 160 ug via INTRAVENOUS

## 2021-12-29 MED ORDER — SODIUM CHLORIDE 0.9% IV SOLUTION
Freq: Once | INTRAVENOUS | Status: AC
Start: 2021-12-29 — End: 2021-12-29

## 2021-12-29 SURGICAL SUPPLY — 22 items

## 2021-12-29 NOTE — Anesthesia Postprocedure Evaluation (Signed)
Anesthesia Post Note  Patient: Michaela Baker  Procedure(s) Performed: COLONOSCOPY WITH PROPOFOL     Patient location during evaluation: PACU Anesthesia Type: MAC Level of consciousness: awake and alert Pain management: pain level controlled Vital Signs Assessment: post-procedure vital signs reviewed and stable Respiratory status: spontaneous breathing, nonlabored ventilation, respiratory function stable and patient connected to nasal cannula oxygen Cardiovascular status: stable and blood pressure returned to baseline Postop Assessment: no apparent nausea or vomiting Anesthetic complications: no   No notable events documented.  Last Vitals:  Vitals:   12/29/21 1102 12/29/21 1106  BP: 132/63 134/60  Pulse:  79  Resp: 17 20  Temp:    SpO2: 100% 100%    Last Pain:  Vitals:   12/29/21 1048  TempSrc: Temporal  PainSc:                  Erol Flanagin S

## 2021-12-29 NOTE — Interval H&P Note (Signed)
History and Physical Interval Note:  12/29/2021 9:55 AM  Michaela Baker  has presented today for surgery, with the diagnosis of Rectal bleeding.  The various methods of treatment have been discussed with the patient and family. After consideration of risks, benefits and other options for treatment, the patient has consented to  Procedure(s): COLONOSCOPY WITH PROPOFOL (N/A) as a surgical intervention.  The patient's history has been reviewed, patient examined, no change in status, stable for surgery.  I have reviewed the patient's chart and labs.  Questions were answered to the patient's satisfaction.     Quantrell Splitt

## 2021-12-29 NOTE — Op Note (Signed)
[facility] Patient Name: Michaela Baker Procedure Date: 12/29/2021 MRN: [id number] Attending MD: Otis Brace , MD Date of Birth: [birth year] CSN: [id number] Age: 71 Admit Type: Outpatient Procedure:                Colonoscopy Indications:              Rectal bleeding Providers:                Otis Brace, MD, Janee Morn, Technician,                            Ladoris Gene, RN Referring MD:              Medicines:                Sedation Administered by an Anesthesia Professional Complications:            No immediate complications. Estimated Blood Loss:     Estimated blood loss was minimal. Procedure:                Pre-Anesthesia Assessment:                           - Prior to the procedure, a History and Physical                            was performed, and patient medications and                            allergies were reviewed. The patient's tolerance of                            previous anesthesia was also reviewed. The risks                            and benefits of the procedure and the sedation                            options and risks were discussed with the patient.                            All questions were answered, and informed consent                            was obtained. Prior Anticoagulants: The patient has                            taken no previous anticoagulant or antiplatelet                            agents except for aspirin. ASA Grade Assessment:                            III - A patient with severe systemic disease. After  reviewing the risks and benefits, the patient was                            deemed in satisfactory condition to undergo the                            procedure.                           After obtaining informed consent, the colonoscope                            was passed under direct vision. Throughout the                            procedure, the patient's blood  pressure, pulse, and                            oxygen saturations were monitored continuously. The                            PCF-HQ190L ([id number]) Olympus colonoscope was                            introduced through the anus and advanced to the the                            terminal ileum, with identification of the                            appendiceal orifice and IC valve. The colonoscopy                            was performed without difficulty. The patient                            tolerated the procedure well. The quality of the                            bowel preparation was fair. Scope In: 10:18:53 AM Scope Out: 10:38:13 AM Scope Withdrawal Time: 0 hours 8 minutes 0 seconds  Total Procedure Duration: 0 hours 19 minutes 20 seconds  Findings:      Skin tags were found on perianal exam.      The terminal ileum appeared normal.      Red and coffee ground blood was found in the entire colon.      Clotted blood was found in the entire colon.      Multiple small and large-mouthed diverticula were found in the entire       colon.      The retroflexed view of the distal rectum and anal verge was normal and       showed no anal or rectal abnormalities. Impression:               - Preparation of the colon was fair.                           -  Perianal skin tags found on perianal exam.                           - The examined portion of the ileum was normal.                           - Blood in the entire examined colon.                           - Blood in the entire examined colon.                           - Diverticulosis in the entire examined colon.                           - No specimens collected. Moderate Sedation:      Moderate (conscious) sedation was personally administered by an       anesthesia professional. The following parameters were monitored: oxygen       saturation, heart rate, blood pressure, and response to care. Recommendation:           - Return patient  to hospital ward for ongoing care.                           - NPO.                           - Continue present medications.                           - Perform CT scan (computed tomography) of the                            abdomen with contrast today. Procedure Code(s):        --- Professional ---                           [phone], Colonoscopy, flexible; diagnostic, including                            collection of specimen(s) by brushing or washing,                            when performed (separate procedure) Diagnosis Code(s):        --- Professional ---                           K92.2, Gastrointestinal hemorrhage, unspecified                           K64.4, Residual hemorrhoidal skin tags                           K62.5, Hemorrhage of anus and rectum                           K57.30, Diverticulosis  of large intestine without                            perforation or abscess without bleeding CPT copyright 2019 [employer]. All rights reserved. The codes documented in this report are preliminary and upon coder review may  be revised to meet current compliance requirements. Otis Brace, MD Otis Brace, MD 12/29/2021 10:49:29 AM Number of Addenda: 0

## 2021-12-29 NOTE — Progress Notes (Signed)
  Transition of Care [facility]) Screening Note   Patient Details  Name: Michaela Baker Date of Birth: [birth year]   Transition of Care [facility]) CM/SW Contact:    Dessa Phi, RN Phone Number: 12/29/2021, 12:24 PM    Transition of Care Department [facility]) has reviewed patient and no TOC needs have been identified at this time. We will continue to monitor patient advancement through interdisciplinary progression rounds. If new patient transition needs arise, please place a TOC consult.

## 2021-12-29 NOTE — Anesthesia Preprocedure Evaluation (Signed)
Anesthesia Evaluation  Patient identified by MRN, date of birth, ID band Patient awake    Reviewed: Allergy & Precautions, NPO status , Patient's Chart, lab work & pertinent test results  Airway Mallampati: II  TM Distance: <3 FB Neck ROM: Full    Dental no notable dental hx.    Pulmonary sleep apnea ,    Pulmonary exam normal breath sounds clear to auscultation       Cardiovascular hypertension, Pt. on medications Normal cardiovascular exam Rhythm:Regular Rate:Normal     Neuro/Psych negative neurological ROS  negative psych ROS   GI/Hepatic negative GI ROS, Neg liver ROS,   Endo/Other  Hypothyroidism   Renal/GU Renal InsufficiencyRenal disease  negative genitourinary   Musculoskeletal negative musculoskeletal ROS (+)   Abdominal   Peds negative pediatric ROS (+)  Hematology  (+) Blood dyscrasia, anemia ,   Anesthesia Other Findings   Reproductive/Obstetrics negative OB ROS                             Anesthesia Physical Anesthesia Plan  ASA: 3  Anesthesia Plan: MAC   Post-op Pain Management: Minimal or no pain anticipated   Induction: Intravenous  PONV Risk Score and Plan: 2 and Propofol infusion and Treatment may vary due to age or medical condition  Airway Management Planned: Simple Face Mask  Additional Equipment:   Intra-op Plan:   Post-operative Plan:   Informed Consent: I have reviewed the patients History and Physical, chart, labs and discussed the procedure including the risks, benefits and alternatives for the proposed anesthesia with the patient or authorized representative who has indicated his/her understanding and acceptance.     Dental advisory given  Plan Discussed with: CRNA and Surgeon  Anesthesia Plan Comments:         Anesthesia Quick Evaluation

## 2021-12-29 NOTE — Progress Notes (Signed)
[facility]  PROGRESS NOTE  Michaela Baker BTD:[id number] DOB: 1951/05/29 DOA: 12/27/2021 PCP: [name], FNP   Brief HPI:   71 year old female with medical history of hypertension, hypothyroidism, obesity, PVCs, recent echo in 2021 with normal ejection fraction and normal diastolic function, varicose veins presented with bright red blood per rectum.  She had 4 episodes of bloody stool at home, large amount on blood clots.  Also had 2 more episodes in the ED. she denies abdominal pain, nausea and vomiting.  Her last colonoscopy was more than 40 years ago.  She had a Cologuard which was negative. In the ED she was found to have hemoglobin of 12.  FOBT was positive.    Subjective   Patient seen and examined, had another episode of small rectal bleeding this morning.  Awaiting for colonoscopy today.   Assessment/Plan:     Hematochezia/GI bleed -Presented with bright red blood per rectum -Differentials include diverticular bleed versus hemorrhoids versus AVM GI was consulted, plan for colonoscopy today -Patient is currently n.p.o.  Acute blood loss anemia -Secondary to above -Patient required 1 unit of PRBC 2 days ago -Hemoglobin is 7.0 this morning -We will transfuse 1 unit PRBC  Acute kidney injury -Baseline creatinine 0.8 -Presented with creatinine of 1.3 -Creatinine has improved to 1.25  Hypothyroidism -Continue Synthroid  Hypertension -Metoprolol and Lasix on hold due to GI bleed  Syncope -Patient had syncopal episode in the hospital 2 nights ago -Likely from ongoing blood loss per rectum -Was given blood transfusion -Continue to monitor on telemetry  Venous insufficiency/ulceration plantar aspect right heel -Wound care consulted -Follow-up vascular surgery as outpatient    Medications     sodium chloride   Intravenous Once   sodium chloride   Intravenous Once   mupirocin ointment   Topical BID   pantoprazole (PROTONIX) IV  40 mg  Intravenous Once   pantoprazole (PROTONIX) IV  40 mg Intravenous Q12H     Data Reviewed:   CBG:  Recent Labs  Lab 12/27/21 2154  GLUCAP 148*    SpO2: 98 %    Vitals:   12/29/21 1100 12/29/21 1102 12/29/21 1106 12/29/21 1120  BP:  132/63 134/60 (!) 154/71  Pulse:   79 83  Resp: '14 17 20 19  '$ Temp:    (!) 97.5 F (36.4 C)  TempSrc:    Axillary  SpO2:  100% 100% 98%  Weight:      Height:          Data Reviewed:  Basic Metabolic Panel: Recent Labs  Lab 12/27/21 0534 12/29/21 0559  NA 137 140  K 3.8 4.3  CL 107 114*  CO2 23 23  GLUCOSE 156* 92  BUN 29* 37*  CREATININE 1.34* 1.25*  CALCIUM 8.8* 7.8*    CBC: Recent Labs  Lab 12/27/21 0534 12/27/21 1800 12/28/21 0552 12/28/21 1730 12/28/21 2023 12/29/21 0039 12/29/21 0559  WBC 5.4  --   --   --   --   --  6.5  HGB 12.8   < > 8.5* 9.4* 8.8* 8.5* 7.0*  HCT 40.7   < > 27.1* 28.9* 27.1* 25.2* 21.4*  MCV 86.4  --   --   --   --   --  86.6  PLT 165  --   --   --   --   --  106*   < > = values in this interval not displayed.    LFT Recent Labs  Lab 12/27/21  0534  AST 17  ALT 13  ALKPHOS 56  BILITOT 0.5  PROT 7.9  ALBUMIN 3.0*     Antibiotics: Anti-infectives (From admission, onward)    None        DVT prophylaxis: SCDs  Code Status: Full code  Family Communication: No family at bedside   CONSULTS gastroenterology   Objective    Physical Examination:   General-appears in no acute distress Heart-S1-S2, regular, no murmur auscultated Lungs-clear to auscultation bilaterally, no wheezing or crackles auscultated Abdomen-soft, nontender, no organomegaly Extremities-no edema in the lower extremities Neuro-alert, oriented x3, no focal deficit noted  Status is: Inpatient:             Oswald Hillock   [facility] If 7PM-7AM, please contact night-coverage at [email or web address], Office  [phone]   12/29/2021, 11:34 AM  LOS: 1 day

## 2021-12-29 NOTE — Transfer of Care (Signed)
Immediate Anesthesia Transfer of Care Note  Patient: Michaela Baker  Procedure(s) Performed: COLONOSCOPY WITH PROPOFOL  Patient Location: PACU  Anesthesia Type:MAC  Level of Consciousness: drowsy  Airway & Oxygen Therapy: Patient Spontanous Breathing and Patient connected to face mask oxygen  Post-op Assessment: Report given to RN, Post -op Vital signs reviewed and stable and Patient moving all extremities X 4  Post vital signs: Reviewed and stable  Last Vitals:  Vitals Value Taken Time  BP 117/37   Temp    Pulse 76   Resp 18 12/29/21 1046  SpO2 100   Vitals shown include unvalidated device data.  Last Pain:  Vitals:   12/29/21 0956  TempSrc: Oral  PainSc: 0-No pain         Complications: No notable events documented.

## 2021-12-29 NOTE — Brief Op Note (Signed)
12/27/2021 - 12/29/2021  10:43 AM  PATIENT:  Michaela Baker  71 y.o. female  PRE-OPERATIVE DIAGNOSIS:  Rectal bleeding  POST-OPERATIVE DIAGNOSIS:  Diverticulosis, evidence of previous  bleeding, no active bleeding seen.  PROCEDURE:  Procedure(s): COLONOSCOPY WITH PROPOFOL (N/A)  SURGEON:  Surgeon(s) and Role:    * Brynne Doane, MD - Primary  Findings ----------- -Colonoscopy showed large amount of fresh blood as well as old blood and blood clots throughout the colon.  No evidence of active bleeding.  Normal TI.  Diverticulosis noted throughout the colon.  Most likely diverticular bleeding.  Recommendations ------------------------ -CT angio GI bleed for further evaluation -Transfuse 1 unit of PRBC -Okay to have full liquid diet after CT scan -GI will follow  Otis Brace MD, FACP 12/29/2021, 10:44 AM  Contact #  [phone]

## 2021-12-30 DIAGNOSIS — K922 Gastrointestinal hemorrhage, unspecified: Secondary | ICD-10-CM | POA: Diagnosis not present

## 2021-12-30 DIAGNOSIS — N179 Acute kidney failure, unspecified: Secondary | ICD-10-CM | POA: Diagnosis not present

## 2021-12-30 DIAGNOSIS — K625 Hemorrhage of anus and rectum: Secondary | ICD-10-CM | POA: Diagnosis not present

## 2021-12-30 DIAGNOSIS — I10 Essential (primary) hypertension: Secondary | ICD-10-CM | POA: Diagnosis not present

## 2021-12-30 LAB — HEMOGLOBIN AND HEMATOCRIT, BLOOD
HCT: 22.4 % — ABNORMAL LOW (ref 36.0–46.0)
HCT: 21.5 % — ABNORMAL LOW (ref 36.0–46.0)
HCT: 25.1 % — ABNORMAL LOW (ref 36.0–46.0)
Hemoglobin: 7.3 g/dL — ABNORMAL LOW (ref 12.0–15.0)
Hemoglobin: 6.8 g/dL — CL (ref 12.0–15.0)
Hemoglobin: 8.2 g/dL — ABNORMAL LOW (ref 12.0–15.0)

## 2021-12-30 LAB — PREPARE RBC (CROSSMATCH)

## 2021-12-30 MED ORDER — SODIUM CHLORIDE 0.9% IV SOLUTION
Freq: Once | INTRAVENOUS | Status: AC
Start: 2021-12-30 — End: 2021-12-30

## 2021-12-30 MED ORDER — ACETAMINOPHEN 325 MG PO TABS
650.0000 mg | ORAL_TABLET | Freq: Once | ORAL | Status: AC
  Administered 2021-12-31: 650 mg via ORAL
  Filled 2021-12-30: qty 2

## 2021-12-30 NOTE — Progress Notes (Signed)
[facility] Gastroenterology Progress Note  Michaela Baker 71 y.o. 1951-01-04  CC: Rectal bleeding   Subjective: Patient seen and examined at bedside.  Feeling better today.  Denies any bowel movement today.  Colonoscopy yesterday showed scattered blood throughout the colon but no evidence of active bleeding.  Follow-up CT angio was also negative for active bleeding.  ROS : Positive for fatigue and weakness   Objective: Vital signs in last 24 hours: Vitals:   12/29/21 2012 12/30/21 0508  BP: (!) 113/54 (!) 126/56  Pulse: 92 84  Resp: 18 18  Temp: 100.3 F (37.9 C) 98.9 F (37.2 C)  SpO2: 97% 95%    Physical Exam:  General:  Alert, cooperative, no distress, appears stated age  Head:  Normocephalic, without obvious abnormality, atraumatic  Eyes:  , EOM's intact,   Lungs:   Clear to auscultation bilaterally, respirations unlabored  Heart:  Regular rate and rhythm, S1, S2 normal  Abdomen:   Soft, non-tender, bowel sounds active all four quadrants,  no masses,   Extremities: Extremities normal, atraumatic, no  edema  Pulses: 2+ and symmetric    Lab Results: Recent Labs    12/29/21 0559 12/29/21 2324  NA 140 139  K 4.3 3.9  CL 114* 113*  CO2 23 23  GLUCOSE 92 113*  BUN 37* 25*  CREATININE 1.25* 1.03*  CALCIUM 7.8* 7.7*   No results for input(s): "AST", "ALT", "ALKPHOS", "BILITOT", "PROT", "ALBUMIN" in the last 72 hours. Recent Labs    12/29/21 0559 12/29/21 1212 12/29/21 2324 12/30/21 0555 12/30/21 1139  WBC 6.5  --  7.9  --   --   HGB 7.0*   < > 7.0* 7.3* 6.8*  HCT 21.4*   < > 21.3* 22.4* 21.5*  MCV 86.6  --  87.3  --   --   PLT 106*  --  111*  --   --    < > = values in this interval not displayed.   Recent Labs    12/27/21 2053  LABPROT 14.6  INR 1.1      Assessment/Plan: -Rectal bleeding.  Most likely diverticular bleed.  Colonoscopy showed fresh, coffee-ground and clotted blood throughout the colon without any active bleeding source.  Most likely  diverticular bleed.  Follow-up CT angio was negative. Acute blood loss anemia -hemoglobin down to 6.8 today.  Recommendations ------------------------ -No further bleeding episodes.  No bowel movement today. -Advance diet to soft. -Transfuse 2 units of PRBC -Repeat CBC in the morning -GI will follow   Otis Brace MD, FACP 12/30/2021, 12:17 PM  Contact #  [phone]

## 2021-12-30 NOTE — Progress Notes (Signed)
[facility]  PROGRESS NOTE  Michaela Baker:[id number] DOB: [birth year] DOA: 12/27/2021 PCP: [name], FNP   Brief HPI:   71 year old female with medical history of hypertension, hypothyroidism, obesity, PVCs, recent echo in 2021 with normal ejection fraction and normal diastolic function, varicose veins presented with bright red blood per rectum.  She had 4 episodes of bloody stool at home, large amount on blood clots.  Also had 2 more episodes in the ED. she denies abdominal pain, nausea and vomiting.  Her last colonoscopy was more than 40 years ago.  She had a Cologuard which was negative. In the ED she was found to have hemoglobin of 12.  FOBT was positive.    Subjective    Patient seen and examined, underwent colonoscopy yesterday which showed fresh coffee-ground and clotted blood throughout the colon without any active bleeding source.  CT angiogram abdomen was done which was negative.  This morning hemoglobin dropped to 6.8.   Assessment/Plan:     Hematochezia/GI bleed -Presented with bright red blood per rectum -Underwent colonoscopy which showed coffee-ground and clotted blood throughout the colon, likely diverticular bleed -CTA abdomen was negative -We will obtain GI bleeding scan if patient has another episode of fresh bleeding -We will discontinue IV Protonix.     Acute blood loss anemia -Secondary to above -Patient required 1 unit of PRBC 2 days ago -Hemoglobin has dropped to 6.8 this morning -We will transfuse 2 units PRBC today -Follow CBC in a.m.  Acute kidney injury -Baseline creatinine 0.8 -Presented with creatinine of 1.3 -Creatinine has improved to 1.03   Hypothyroidism -Continue Synthroid  Hypertension -Metoprolol and Lasix on hold due to GI bleed  Syncope -Patient had syncopal episode in the hospital 2 nights ago -Likely from ongoing blood loss per rectum -Was given blood transfusion -Continue to monitor on telemetry  Venous  insufficiency/ulceration plantar aspect right heel -Wound care consulted -Follow-up vascular surgery as outpatient    Medications     sodium chloride   Intravenous Once   sodium chloride   Intravenous Once   mupirocin ointment   Topical BID   pantoprazole (PROTONIX) IV  40 mg Intravenous Once   pantoprazole (PROTONIX) IV  40 mg Intravenous Q12H     Data Reviewed:   CBG:  Recent Labs  Lab 12/27/21 2154  GLUCAP 148*    SpO2: 95 %    Vitals:   12/29/21 1445 12/29/21 1720 12/29/21 2012 12/30/21 0508  BP: 121/63 125/65 (!) 113/54 (!) 126/56  Pulse: 78 88 92 84  Resp: '20 17 18 18  '$ Temp: 97.9 F (36.6 C) 98.4 F (36.9 C) 100.3 F (37.9 C) 98.9 F (37.2 C)  TempSrc: Oral Oral Oral Oral  SpO2: 96% 100% 97% 95%  Weight:      Height:          Data Reviewed:  Basic Metabolic Panel: Recent Labs  Lab 12/27/21 0534 12/29/21 0559 12/29/21 2324  NA 137 140 139  K 3.8 4.3 3.9  CL 107 114* 113*  CO2 '23 23 23  '$ GLUCOSE 156* 92 113*  BUN 29* 37* 25*  CREATININE 1.34* 1.25* 1.03*  CALCIUM 8.8* 7.8* 7.7*    CBC: Recent Labs  Lab 12/27/21 0534 12/27/21 1800 12/29/21 0559 12/29/21 1212 12/29/21 2043 12/29/21 2324 12/30/21 0555 12/30/21 1139  WBC 5.4  --  6.5  --   --  7.9  --   --   HGB 12.8   < > 7.0* 7.0* 7.3*  7.0* 7.3* 6.8*  HCT 40.7   < > 21.4* 21.6* 22.7* 21.3* 22.4* 21.5*  MCV 86.4  --  86.6  --   --  87.3  --   --   PLT 165  --  106*  --   --  111*  --   --    < > = values in this interval not displayed.    LFT Recent Labs  Lab 12/27/21 0534  AST 17  ALT 13  ALKPHOS 56  BILITOT 0.5  PROT 7.9  ALBUMIN 3.0*     Antibiotics: Anti-infectives (From admission, onward)    None        DVT prophylaxis: SCDs  Code Status: Full code  Family Communication: No family at bedside   CONSULTS gastroenterology   Objective    Physical Examination:  General-appears in no acute distress Heart-S1-S2, regular, no murmur  auscultated Lungs-clear to auscultation bilaterally, no wheezing or crackles auscultated Abdomen-soft, nontender, no organomegaly Extremities-no edema in the lower extremities Neuro-alert, oriented x3, no focal deficit noted patient is in a community  Status is: Inpatient:             Oswald Hillock   [facility] If 7PM-7AM, please contact night-coverage at [email or web address], Office  [phone]   12/30/2021, 12:57 PM  LOS: 2 days

## 2021-12-30 NOTE — Progress Notes (Signed)
       CROSS COVER NOTE  NAME: [name] MRN: [id number] DOB : [birth year]    Date of Service   12/30/2021  HPI/Events of Note   Medication request received from nursing to address 38.9C Temp.  Interventions   Plan: Acetaminophen x1      This document was prepared using Dragon voice recognition software and may include unintentional dictation errors.  Neomia Glass DNP, MHA, FNP-BC Nurse Practitioner [facility] Pager [phone]

## 2021-12-30 NOTE — Progress Notes (Signed)
Pt temp 102.1(38.9). No med on MAR to admin. Notified provider.

## 2021-12-30 NOTE — Plan of Care (Signed)
  Problem: Clinical Measurements: Goal: Will remain free from infection Outcome: Progressing Goal: Diagnostic test results will improve Outcome: Progressing Goal: Respiratory complications will improve Outcome: Progressing Goal: Cardiovascular complication will be avoided Outcome: Progressing   Problem: Activity: Goal: Risk for activity intolerance will decrease Outcome: Progressing   

## 2021-12-31 ENCOUNTER — Encounter (HOSPITAL_COMMUNITY): Payer: Self-pay | Admitting: Gastroenterology

## 2021-12-31 DIAGNOSIS — K922 Gastrointestinal hemorrhage, unspecified: Secondary | ICD-10-CM | POA: Diagnosis not present

## 2021-12-31 DIAGNOSIS — N179 Acute kidney failure, unspecified: Secondary | ICD-10-CM | POA: Diagnosis not present

## 2021-12-31 DIAGNOSIS — I10 Essential (primary) hypertension: Secondary | ICD-10-CM | POA: Diagnosis not present

## 2021-12-31 DIAGNOSIS — K625 Hemorrhage of anus and rectum: Secondary | ICD-10-CM | POA: Diagnosis not present

## 2021-12-31 DIAGNOSIS — I493 Ventricular premature depolarization: Secondary | ICD-10-CM

## 2021-12-31 LAB — HEMOGLOBIN AND HEMATOCRIT, BLOOD
HCT: 27.4 % — ABNORMAL LOW (ref 36.0–46.0)
HCT: 28.3 % — ABNORMAL LOW (ref 36.0–46.0)
Hemoglobin: 8.7 g/dL — ABNORMAL LOW (ref 12.0–15.0)
Hemoglobin: 9 g/dL — ABNORMAL LOW (ref 12.0–15.0)

## 2021-12-31 LAB — BPAM RBC
Blood Product Expiration Date: 202308122359
Blood Product Expiration Date: 202308252359
Blood Product Expiration Date: 202308262359
Blood Product Expiration Date: 202309022359
ISSUE DATE / TIME: 202308010904
ISSUE DATE / TIME: 202308021417
ISSUE DATE / TIME: 202308031333
ISSUE DATE / TIME: 202308031609
Unit Type and Rh: 7300
Unit Type and Rh: 7300
Unit Type and Rh: 7300
Unit Type and Rh: 7300

## 2021-12-31 LAB — TYPE AND SCREEN
ABO/RH(D): B POS
Antibody Screen: NEGATIVE
Unit division: 0
Unit division: 0
Unit division: 0
Unit division: 0

## 2021-12-31 LAB — CBC
HCT: 25.3 % — ABNORMAL LOW (ref 36.0–46.0)
Hemoglobin: 8.2 g/dL — ABNORMAL LOW (ref 12.0–15.0)
MCH: 27.9 pg (ref 26.0–34.0)
MCHC: 32.4 g/dL (ref 30.0–36.0)
MCV: 86.1 fL (ref 80.0–100.0)
Platelets: 109 10*3/uL — ABNORMAL LOW (ref 150–400)
RBC: 2.94 MIL/uL — ABNORMAL LOW (ref 3.87–5.11)
RDW: 18.3 % — ABNORMAL HIGH (ref 11.5–15.5)
WBC: 7.1 10*3/uL (ref 4.0–10.5)
nRBC: 0 % (ref 0.0–0.2)

## 2021-12-31 MED ORDER — DOXYCYCLINE HYCLATE 100 MG PO CAPS: 100.0000 mg | ORAL_CAPSULE | Freq: Two times a day (BID) | ORAL | 0 refills | Status: DC

## 2021-12-31 NOTE — Discharge Summary (Addendum)
Physician Discharge Summary   Patient: Michaela Baker MRN: [id number] DOB: 1950-11-02  Admit date:     12/27/2021  Discharge date: 12/31/21  Discharge Physician: Oswald Hillock   PCP: [name], FNP   Recommendations at discharge:   Follow-up PCP in 2 weeks Do not take Motrin, Advil or NSAIDs Start taking aspirin from 01/03/2022 Take high-fiber diet  Discharge Diagnoses: Principal Problem:   GI bleed Diverticulosis Active Problems:   Hypertension   Obesity (BMI 30-39.9)   Premature ventricular contractions  Resolved Problems:   * No resolved hospital problems. [facility] Course: 71 year old female with medical history of hypertension, hypothyroidism, obesity, PVCs, recent echo in 2021 with normal ejection fraction and normal diastolic function, varicose veins presented with bright red blood per rectum.  She had 4 episodes of bloody stool at home, large amount on blood clots.  Also had 2 more episodes in the ED. she denies abdominal pain, nausea and vomiting.  Her last colonoscopy was more than 40 years ago.  She had a Cologuard which was negative. In the ED she was found to have hemoglobin of 12.  FOBT was positive.  Assessment and Plan:  Hematochezia/GI bleed/diverticulosis -Presented with bright red blood per rectum -Underwent colonoscopy which showed coffee-ground and clotted blood throughout the colon, likely diverticular bleed -CTA abdomen was negative -Bleeding has stopped -Gastroenterology recommends to discharge home, recommend high-fiber diet -Avoid NSAIDs -Can start taking baby aspirin from 01/03/2022     Acute blood loss anemia -Secondary to above -Patient required 1 unit of PRBC 2 days ago -Hemoglobin has dropped to 6.8 this morning -S/p transfusion of 2 units PRBC  -Hemoglobin is stable at 8.7 this morning     Acute kidney injury -Baseline creatinine 0.8 -Presented with creatinine of 1.3 -Creatinine has improved to 1.03     Hypothyroidism -Continue Synthroid   Hypertension -   Syncope -Patient had syncopal episode in the hospital 2 nights ago -Likely from ongoing blood loss per rectum -Was given blood transfusion -   Venous insufficiency/ulceration plantar aspect right heel -Wound care consulted -Patient is followed by podiatrist -Follow-up podiatry as outpatient   Fever -Patient will obtain fever last night 102F -This morning she is afebrile -Denies any complaints -On examination of her right foot she has ulceration of plantar aspect right heel  mild warmth on palpation, nontender to palpation -WBC is 7.1 this morning -I will discharge her on doxycycline 100 mg p.o. twice daily for 5 days for questionable cellulitis -Patient to follow-up with her podiatrist as outpatient       Consultants: Gastroenterology Procedures performed: Colonoscopy Disposition: Home Diet recommendation:  Discharge Diet Orders (From admission, onward)     Start     Ordered   12/31/21 0000  Diet - low sodium heart healthy        12/31/21 1143           Regular diet DISCHARGE MEDICATION: Allergies as of 12/31/2021   No Known Allergies      Medication List     STOP taking these medications    naproxen sodium 220 MG tablet Commonly known as: ALEVE       TAKE these medications    acetaminophen 500 MG tablet Commonly known as: TYLENOL Take 1,000 mg by mouth as needed (pain).   amLODipine 10 MG tablet Commonly known as: NORVASC Take 10 mg by mouth daily.   aspirin EC 81 MG tablet Take 1 tablet (81 mg total) by mouth daily.  dapagliflozin propanediol 10 MG Tabs tablet Commonly known as: FARXIGA Take 10 mg by mouth daily.   doxycycline 100 MG capsule Commonly known as: VIBRAMYCIN Take 1 capsule (100 mg total) by mouth 2 (two) times daily.   Fish Oil 1000 MG Caps Take 2,000 mg by mouth daily.   furosemide 40 MG tablet Commonly known as: LASIX Take 40 mg by mouth daily.    levothyroxine 25 MCG tablet Commonly known as: SYNTHROID Take 25 mcg by mouth daily.   metoprolol succinate 50 MG 24 hr tablet Commonly known as: TOPROL-XL Take 50 mg by mouth daily. Take with or immediately following a meal.   multivitamin with minerals tablet Take 2 tablets by mouth daily.   mupirocin ointment 2 % Commonly known as: BACTROBAN Apply 1 application to affected area topically 2 times daily. What changed: See the new instructions.   potassium chloride SA 20 MEQ tablet Commonly known as: KLOR-CON M Take 20 mEq by mouth daily. What changed: Another medication with the same name was removed. Continue taking this medication, and follow the directions you see here.   rosuvastatin 5 MG tablet Commonly known as: CRESTOR Take 5 mg by mouth daily.   thiamine 100 MG tablet Commonly known as: VITAMIN B1 Take 100 mg by mouth daily.        Follow-up Information     [name], FNP Follow up in 2 week(s).   Specialty: Nurse Practitioner Contact information: [place] [address] [phone]                Discharge Exam: Danley Danker Weights   12/27/21 0532  Weight: 98.4 kg   General-appears in no acute distress Heart-S1-S2, regular, no murmur auscultated Lungs-clear to auscultation bilaterally, no wheezing or crackles auscultated Abdomen-soft, nontender, no organomegaly Extremities-no edema in the lower extremities Neuro-alert, oriented x3, no focal deficit noted  Condition at discharge: good  The results of significant diagnostics from this hospitalization (including imaging, microbiology, ancillary and laboratory) are listed below for reference.   Imaging Studies: CT ANGIO GI BLEED  Result Date: 12/29/2021 CLINICAL DATA:  Lower GI bleed, rectal bleeding, had colonoscopy today EXAM: CTA ABDOMEN AND PELVIS WITHOUT AND WITH CONTRAST TECHNIQUE: Multidetector CT imaging of the abdomen and pelvis was performed using the  standard protocol during bolus administration of intravenous contrast. Multiplanar reconstructed images and MIPs were obtained and reviewed to evaluate the vascular anatomy. RADIATION DOSE REDUCTION: This exam was performed according to the departmental dose-optimization program which includes automated exposure control, adjustment of the mA and/or kV according to patient size and/or use of iterative reconstruction technique. CONTRAST:  151m OMNIPAQUE IOHEXOL 350 MG/ML SOLN IV COMPARISON:  None FINDINGS: VASCULAR Aorta: No intramural hematoma on precontrast imaging. Atherosclerotic calcifications. Normal caliber. No aneurysm or dissection. Celiac: Calcified plaque at origin, less than 50% narrowing SMA: Calcified plaque at origin, approaching 50% narrowing. Renals: Approaching 50% narrowing at the origin of the LEFT renal artery. Greater than 50% narrowing at origin of RIGHT renal artery. IMA: Significant calcified atherosclerotic plaque at origin of IMA, which appears narrowed Inflow: Scattered atherosclerotic calcifications of common and external iliac arteries. No significant narrowing. Proximal Outflow: Mild plaque formation.  No significant narrowing. Veins: Major venous structures patent Review of the MIP images confirms the above findings. NON-VASCULAR Lower chest: Bibasilar atelectasis Hepatobiliary: Gallbladder surgically absent. Liver unremarkable. Dilated CBD 13 mm diameter. Pancreas: Normal appearance Spleen: Nonspecific 9 mm low-attenuation focus in spleen. Otherwise unremarkable. Adrenals/Urinary Tract: Mild  adrenal thickening without mass. BILATERAL renal cysts. No hydronephrosis or solid renal mass. No ureteral calcification or dilatation. Bladder unremarkable. Stomach/Bowel: Appendix not visualized. Diverticulosis of colon from proximal transverse through sigmoid colon. No CT evidence of diverticulitis. No definite GI contrast extravasation to suggest active GI bleeding. Lymphatic: No adenopathy.  Reproductive: Uterus surgically absent. Mildly prominent appearing ovaries for age without focal mass. Other: No free air or free fluid. No hernia or inflammatory process. Musculoskeletal: Multilevel degenerative disc and facet disease changes lumbar spine. IMPRESSION: VASCULAR Scattered atherosclerotic calcifications without aortic aneurysm. At least 50% narrowing at the SMA, LEFT renal artery, IMA. No contrast extravasation within bowel to suggest active GI bleeding. NON-VASCULAR Extensive colonic diverticulosis without evidence of diverticulitis. BILATERAL renal cysts; no follow-up imaging recommended. Nonspecific 9 mm low-attenuation focus in spleen. Electronically Signed   By: Lavonia Dana M.D.   On: 12/29/2021 13:57    Microbiology: Results for orders placed or performed during the hospital encounter of 03/03/21  Resp Panel by RT-PCR (Flu A&B, Covid) Nasopharyngeal Swab     Status: None   Collection Time: 03/03/21  2:23 PM   Specimen: Nasopharyngeal Swab; Nasopharyngeal(NP) swabs in vial transport medium  Result Value Ref Range Status   SARS Coronavirus 2 by RT PCR NEGATIVE NEGATIVE Final    Comment: (NOTE) SARS-CoV-2 target nucleic acids are NOT DETECTED.  The SARS-CoV-2 RNA is generally detectable in upper respiratory specimens during the acute phase of infection. The lowest concentration of SARS-CoV-2 viral copies this assay can detect is 138 copies/mL. A negative result does not preclude SARS-Cov-2 infection and should not be used as the sole basis for treatment or other patient management decisions. A negative result may occur with  improper specimen collection/handling, submission of specimen other than nasopharyngeal swab, presence of viral mutation(s) within the areas targeted by this assay, and inadequate number of viral copies(<138 copies/mL). A negative result must be combined with clinical observations, patient history, and epidemiological information. The expected result is  Negative.  Fact Sheet for Patients:  EntrepreneurPulse.com.au  Fact Sheet for Healthcare Providers:  IncredibleEmployment.be  This test is no t yet approved or cleared by the [place] FDA and  has been authorized for detection and/or diagnosis of SARS-CoV-2 by FDA under an Emergency Use Authorization (EUA). This EUA will remain  in effect (meaning this test can be used) for the duration of the COVID-19 declaration under Section 564(b)(1) of the Act, 21 U.S.C.section 360bbb-3(b)(1), unless the authorization is terminated  or revoked sooner.       Influenza A by PCR NEGATIVE NEGATIVE Final   Influenza B by PCR NEGATIVE NEGATIVE Final    Comment: (NOTE) The Xpert Xpress SARS-CoV-2/FLU/RSV plus assay is intended as an aid in the diagnosis of influenza from Nasopharyngeal swab specimens and should not be used as a sole basis for treatment. Nasal washings and aspirates are unacceptable for Xpert Xpress SARS-CoV-2/FLU/RSV testing.  Fact Sheet for Patients: EntrepreneurPulse.com.au  Fact Sheet for Healthcare Providers: IncredibleEmployment.be  This test is not yet approved or cleared by the [place] FDA and has been authorized for detection and/or diagnosis of SARS-CoV-2 by FDA under an Emergency Use Authorization (EUA). This EUA will remain in effect (meaning this test can be used) for the duration of the COVID-19 declaration under Section 564(b)(1) of the Act, 21 U.S.C. section 360bbb-3(b)(1), unless the authorization is terminated or revoked.  Performed at [facility], [place] [address], [place], [place] [address]   Blood culture (routine single)  Status: None   Collection Time: 03/04/21  4:29 PM   Specimen: BLOOD  Result Value Ref Range Status   Specimen Description   Final    BLOOD RIGHT ANTECUBITAL Performed at [place] [address],  [place], [place] [address]    Special Requests   Final    BOTTLES DRAWN AEROBIC AND ANAEROBIC Blood Culture adequate volume Performed at [facility] [facility] [address] [place], [place] [address]    Culture   Final    NO GROWTH 5 DAYS Performed at [facility], [place] [address] [place], [place] [address]    Report Status 03/09/2021 FINAL  Final  Culture, blood (routine x 2)     Status: None   Collection Time: 03/10/21  7:14 PM   Specimen: BLOOD  Result Value Ref Range Status   Specimen Description   Final    BLOOD RIGHT ANTECUBITAL Performed at [place] [address] [place], [place] [address]    Special Requests   Final    BOTTLES DRAWN AEROBIC ONLY Blood Culture adequate volume Performed at [place] [address], [place], [place] [address]    Culture   Final    NO GROWTH 5 DAYS Performed at [facility], [place] [address], [place], [place] [address]    Report Status 03/15/2021 FINAL  Final  Culture, blood (routine x 2)     Status: None   Collection Time: 03/10/21  7:14 PM   Specimen: BLOOD LEFT FOREARM  Result Value Ref Range Status   Specimen Description   Final    BLOOD LEFT FOREARM Performed at [place] [address], [place], [place] [address]    Special Requests   Final    BOTTLES DRAWN AEROBIC ONLY Blood Culture adequate volume Performed at [place] [address], [place], [place] [address]    Culture   Final    NO GROWTH 5 DAYS Performed at [facility], [place] [address], [place], [place] [address]    Report Status 03/15/2021 FINAL  Final    Labs: CBC: Recent Labs  Lab 12/27/21 0534 12/27/21 1800 12/29/21 0559 12/29/21 1212 12/29/21 2324 12/30/21 0555 12/30/21 1139 12/30/21 2139 12/31/21 0015 12/31/21 0538  WBC 5.4  --  6.5  --  7.9  --   --   --  7.1  --   HGB 12.8   < > 7.0*   < > 7.0* 7.3* 6.8* 8.2* 8.2* 8.7*  HCT 40.7    < > 21.4*   < > 21.3* 22.4* 21.5* 25.1* 25.3* 27.4*  MCV 86.4  --  86.6  --  87.3  --   --   --  86.1  --   PLT 165  --  106*  --  111*  --   --   --  109*  --    < > = values in this interval not displayed.   Basic Metabolic Panel: Recent Labs  Lab 12/27/21 0534 12/29/21 0559 12/29/21 2324  NA 137 140 139  K 3.8 4.3 3.9  CL 107 114* 113*  CO2 '23 23 23  '$ GLUCOSE 156* 92 113*  BUN 29* 37* 25*  CREATININE 1.34* 1.25* 1.03*  CALCIUM 8.8* 7.8* 7.7*   Liver Function Tests: Recent Labs  Lab 12/27/21 0534  AST 17  ALT 13  ALKPHOS 56  BILITOT 0.5  PROT 7.9  ALBUMIN 3.0*  CBG: Recent Labs  Lab 12/27/21 2154  GLUCAP 148*    Discharge time spent: greater than 30 minutes.  Signed: Oswald Hillock, MD [facility] 12/31/2021

## 2021-12-31 NOTE — Care Management Important Message (Signed)
Important Message  Patient Details  Name: Michaela Baker MRN: [id number] Date of Birth: 1951/03/01   Medicare Important Message Given:  Yes     Memory Argue 12/31/2021, 12:50 PM

## 2021-12-31 NOTE — Progress Notes (Signed)
[facility] Progress Note  Michaela Baker 71 y.o. 03-29-1951  CC: Rectal bleeding   Subjective: Patient seen and examined at bedside.  Denies any further bleeding episodes.  Feeling better and wants to go home.  ROS : Negative for chest pain   Objective: Vital signs in last 24 hours: Vitals:   12/31/21 0013 12/31/21 0435  BP: 132/65 (!) 109/55  Pulse: 85 74  Resp: 18 18  Temp: 100.1 F (37.8 C) 98.3 F (36.8 C)  SpO2: 92% 93%    Physical Exam:  General:  Alert, cooperative, no distress, appears stated age  Head:  Normocephalic, without obvious abnormality, atraumatic  Eyes:  , EOM's intact,   Lungs:   Clear to auscultation bilaterally, respirations unlabored  Heart:  Regular rate and rhythm, S1, S2 normal  Abdomen:   Soft, non-tender, bowel sounds active all four quadrants,  no masses,   Extremities: Extremities normal, atraumatic, no  edema  Pulses: 2+ and symmetric    Lab Results: Recent Labs    12/29/21 0559 12/29/21 2324  NA 140 139  K 4.3 3.9  CL 114* 113*  CO2 23 23  GLUCOSE 92 113*  BUN 37* 25*  CREATININE 1.25* 1.03*  CALCIUM 7.8* 7.7*   No results for input(s): "AST", "ALT", "ALKPHOS", "BILITOT", "PROT", "ALBUMIN" in the last 72 hours. Recent Labs    12/29/21 2324 12/30/21 0555 12/31/21 0015 12/31/21 0538  WBC 7.9  --  7.1  --   HGB 7.0*   < > 8.2* 8.7*  HCT 21.3*   < > 25.3* 27.4*  MCV 87.3  --  86.1  --   PLT 111*  --  109*  --    < > = values in this interval not displayed.   No results for input(s): "LABPROT", "INR" in the last 72 hours.     Assessment/Plan: -Rectal bleeding.  Most likely diverticular bleed.  Colonoscopy showed fresh, coffee-ground and clotted blood throughout the colon without any active bleeding source.  Most likely diverticular bleed.  Follow-up CT angio was negative.  Acute blood loss anemia -status post blood transfusion  Recommendations ------------------------ -Hemoglobin improved to 8.7 after  blood transfusion.  No further bleeding episodes.  No further inpatient GI work-up planned.  Okay to discharge from GI standpoint.  GI will sign off.  Call us back if needed. -Patient was advised to avoid NSAIDs.  High-fiber diet.   Otis Brace MD, [place] 12/31/2021, 11:17 AM  Contact #  [phone]

## 2022-01-04 ENCOUNTER — Other Ambulatory Visit: Payer: Self-pay | Admitting: *Deleted

## 2022-01-04 DIAGNOSIS — I83811 Varicose veins of right lower extremities with pain: Secondary | ICD-10-CM

## 2022-01-10 DIAGNOSIS — K5730 Diverticulosis of large intestine without perforation or abscess without bleeding: Secondary | ICD-10-CM | POA: Insufficient documentation

## 2022-01-18 ENCOUNTER — Other Ambulatory Visit: Payer: Self-pay | Admitting: *Deleted

## 2022-01-18 MED ORDER — LORAZEPAM 1 MG PO TABS: ORAL_TABLET | ORAL | 0 refills | Status: DC

## 2022-01-26 ENCOUNTER — Ambulatory Visit (INDEPENDENT_AMBULATORY_CARE_PROVIDER_SITE_OTHER): Payer: Medicare Other | Admitting: Vascular Surgery

## 2022-01-26 ENCOUNTER — Encounter: Payer: Self-pay | Admitting: Vascular Surgery

## 2022-01-26 VITALS — BP 135/78 | HR 92 | Temp 97.9°F | Resp 18 | Ht 65.0 in | Wt 211.0 lb

## 2022-01-26 DIAGNOSIS — I83811 Varicose veins of right lower extremities with pain: Secondary | ICD-10-CM

## 2022-01-26 DIAGNOSIS — I872 Venous insufficiency (chronic) (peripheral): Secondary | ICD-10-CM

## 2022-01-26 HISTORY — PX: ENDOVENOUS ABLATION SAPHENOUS VEIN W/ LASER: SUR449

## 2022-01-26 NOTE — Progress Notes (Signed)
     Laser Ablation Procedure     Date: 01/26/2022   Michaela Baker DOB:April 01, 1951  Consent signed: Yes      Surgeon: Gae Gallop MD   Procedure: Laser Ablation: right Greater Saphenous Vein  BP 135/78 (BP Location: Right Arm, Patient Position: Sitting, Cuff Size: Large)   Pulse 92   Temp 97.9 F (36.6 C) (Temporal)   Resp 18   Ht '5\' 5"'$  (1.651 m)   Wt 211 lb (95.7 kg)   SpO2 97%   BMI 35.11 kg/m   Tumescent Anesthesia: 425 cc 0.9% NaCl with 50 cc Lidocaine HCL 1%  and 15 cc 8.4% NaHCO3  Local Anesthesia: 6 cc Lidocaine HCL and NaHCO3 (ratio 2:1)  7 watts continuous mode     Total energy: 2199.8 Joules     Total time: 314 seconds  Treatment Length 45 cm   Laser Fiber Ref. #  [id number]     Lot # [id number]     Patient tolerated procedure well  Notes: All staff members wore facial masks. [name] took Ativan 1 mg (1 tablet) on 12-30-2021 at 10:03 AM.     Description of Procedure:  After marking the course of the secondary varicosities, the patient was placed on the operating table in the supine position, and the right leg was prepped and draped in sterile fashion.   Local anesthetic was administered and under ultrasound guidance the saphenous vein was accessed with a micro needle and guide wire; then the mirco puncture sheath was placed.  A guide wire was inserted saphenofemoral junction , followed by a 5 french sheath.  The position of the sheath and then the laser fiber below the junction was confirmed using the ultrasound.  Tumescent anesthesia was administered along the course of the saphenous vein using ultrasound guidance. The patient was placed in Trendelenburg position and protective laser glasses were placed on patient and staff, and the laser was fired at 7 watts continuous mode for a total of 2199.8 joules.       Steri strip was applied to the IV insertion site and ABD pads and thigh high compression stockings were applied.  Ace wrap bandages were applied  over the right thigh and at the top of the saphenofemoral junction. Blood loss was less than 15 cc.  Discharge instructions reviewed with patient and hardcopy of discharge instructions given to patient to take home. The patient ambulated out of the operating room having tolerated the procedure well.

## 2022-01-26 NOTE — Progress Notes (Signed)
Patient name: Michaela Baker MRN: [id number] DOB: May 27, 1951 Sex: female  REASON FOR VISIT: For laser ablation of the right great saphenous vein.  HPI: Michaela Baker is a 71 y.o. female who I saw on 12/15/2021 with chronic venous insufficiency.  She had CEAP C4 venous disease and had failed conservative treatment including thigh-high compression stockings, leg elevation, and exercise.  I felt she would be a good candidate for laser ablation of the left great saphenous vein.  Of note, at the time of her last visit I did look at her right great saphenous vein myself with the SonoSite.  She had reflux down to the proximal calf.  Diameters of the vein ranged from 6 to 8 mm.   Current Outpatient Medications  Medication Sig Dispense Refill   acetaminophen (TYLENOL) 500 MG tablet Take 1,000 mg by mouth as needed (pain).     amLODipine (NORVASC) 10 MG tablet Take 10 mg by mouth daily.     aspirin EC 81 MG EC tablet Take 1 tablet (81 mg total) by mouth daily. 30 tablet 1   dapagliflozin propanediol (FARXIGA) 10 MG TABS tablet Take 10 mg by mouth daily.     doxycycline (VIBRAMYCIN) 100 MG capsule Take 1 capsule (100 mg total) by mouth 2 (two) times daily. 10 capsule 0   furosemide (LASIX) 40 MG tablet Take 40 mg by mouth daily.     levothyroxine (SYNTHROID, LEVOTHROID) 25 MCG tablet Take 25 mcg by mouth daily.  5   LORazepam (ATIVAN) 1 MG tablet Take 1 tablet 30 minutes prior to leaving house on day of office surgery.  Bring second tablet with you to office on day of office surgery. 2 tablet 0   metoprolol succinate (TOPROL-XL) 50 MG 24 hr tablet Take 50 mg by mouth daily. Take with or immediately following a meal.     Multiple Vitamins-Minerals (MULTIVITAMIN WITH MINERALS) tablet Take 2 tablets by mouth daily.     mupirocin ointment (BACTROBAN) 2 % Apply 1 application to affected area topically 2 times daily. (Patient taking differently: Apply 1 Application topically 2 (two) times daily. To foot) 22 g 0    Omega-3 Fatty Acids (FISH OIL) 1000 MG CAPS Take 2,000 mg by mouth daily.     potassium chloride SA (KLOR-CON M) 20 MEQ tablet Take 20 mEq by mouth daily.     rosuvastatin (CRESTOR) 5 MG tablet Take 5 mg by mouth daily.     thiamine 100 MG tablet Take 100 mg by mouth daily. (Patient not taking: Reported on 12/28/2021)     No current facility-administered medications for this visit.    PHYSICAL EXAM: There were no vitals filed for this visit.  PROCEDURE: Laser ablation of the right great saphenous vein  TECHNIQUE: The patient was taken to the exam room and placed supine.  I looked at the right great saphenous vein myself with the SonoSite and I felt that I could cannulate this proximal calf.  The right leg was prepped and draped in usual sterile fashion.  Under ultrasound guidance, after the skin was anesthetized, I cannulated the right great saphenous vein in the proximal calf with a micropuncture needle and a micropuncture sheath was introduced over a wire.  I then advanced the J-wire to below the saphenofemoral junction.  This was positioned about 2 and half centimeters distal to the saphenofemoral junction.  I then remove the wire and dilator and advanced the laser fiber through the sheath.  The sheath was then retracted.  Next tumescent anesthesia was administered circumferentially around the vein.  Patient was placed in Trendelenburg.  We placed laser glasses on.  Laser ablation was performed of the right great saphenous vein for approximately 3 cm distal to the saphenofemoral junction to the proximal calf.  50 J/cm was used at 7 W.  Pressure dressing was applied.  The patient tolerated the procedure well and will return in 1 week for follow-up duplex.  Deitra Mayo [facility] [phone]

## 2022-02-02 ENCOUNTER — Ambulatory Visit (INDEPENDENT_AMBULATORY_CARE_PROVIDER_SITE_OTHER): Payer: Medicare Other | Admitting: Vascular Surgery

## 2022-02-02 ENCOUNTER — Encounter: Payer: Self-pay | Admitting: Vascular Surgery

## 2022-02-02 ENCOUNTER — Ambulatory Visit (HOSPITAL_COMMUNITY)
Admission: RE | Admit: 2022-02-02 | Discharge: 2022-02-02 | Disposition: A | Discharge status: Home / Self Care | Payer: Medicare Other | Source: Ambulatory Visit | Attending: Vascular Surgery | Admitting: Vascular Surgery

## 2022-02-02 VITALS — BP 157/84 | HR 70 | Temp 97.9°F | Resp 16 | Ht 65.0 in | Wt 211.0 lb

## 2022-02-02 DIAGNOSIS — I83811 Varicose veins of right lower extremities with pain: Secondary | ICD-10-CM | POA: Diagnosis not present

## 2022-02-02 NOTE — Progress Notes (Signed)
Patient name: Michaela Baker MRN: [id number] DOB: 05-Nov-1950 Sex: female  REASON FOR VISIT: Follow-up after laser ablation of the right great saphenous vein.  HPI: Michaela Baker is a 71 y.o. female with CEAP C4 venous disease.  She had failed conservative treatment and was felt to be a candidate for laser ablation of the left great saphenous vein.  On 01/26/2022 she underwent successful laser ablation of the right great saphenous vein from 2 and half centimeters distal to the saphenofemoral junction to the proximal calf.  Today she has no specific complaints.  She did have problems getting her stockings on so she really has not been wearing the thigh-high stocking.  She has been elevating her legs.  She denies any chest pain or shortness of breath.  She denies any significant bruising or leg pain.  Current Outpatient Medications  Medication Sig Dispense Refill   acetaminophen (TYLENOL) 500 MG tablet Take 1,000 mg by mouth as needed (pain).     amLODipine (NORVASC) 10 MG tablet Take 10 mg by mouth daily.     aspirin EC 81 MG EC tablet Take 1 tablet (81 mg total) by mouth daily. 30 tablet 1   dapagliflozin propanediol (FARXIGA) 10 MG TABS tablet Take 10 mg by mouth daily.     doxycycline (VIBRAMYCIN) 100 MG capsule Take 1 capsule (100 mg total) by mouth 2 (two) times daily. 10 capsule 0   furosemide (LASIX) 40 MG tablet Take 40 mg by mouth daily.     levothyroxine (SYNTHROID, LEVOTHROID) 25 MCG tablet Take 25 mcg by mouth daily.  5   metoprolol succinate (TOPROL-XL) 50 MG 24 hr tablet Take 50 mg by mouth daily. Take with or immediately following a meal.     Multiple Vitamins-Minerals (MULTIVITAMIN WITH MINERALS) tablet Take 2 tablets by mouth daily.     mupirocin ointment (BACTROBAN) 2 % Apply 1 application to affected area topically 2 times daily. (Patient taking differently: Apply 1 Application topically 2 (two) times daily. To foot) 22 g 0   Omega-3 Fatty Acids (FISH OIL) 1000 MG CAPS Take  2,000 mg by mouth daily.     potassium chloride SA (KLOR-CON M) 20 MEQ tablet Take 20 mEq by mouth daily.     rosuvastatin (CRESTOR) 5 MG tablet Take 5 mg by mouth daily.     thiamine 100 MG tablet Take 100 mg by mouth daily.     LORazepam (ATIVAN) 1 MG tablet Take 1 tablet 30 minutes prior to leaving house on day of office surgery.  Bring second tablet with you to office on day of office surgery. (Patient not taking: Reported on 02/02/2022) 2 tablet 0   No current facility-administered medications for this visit.   REVIEW OF SYSTEMS: Valu.Nieves ] denotes positive finding; [  ] denotes negative finding  CARDIOVASCULAR:  '[ ]'$  chest pain   '[ ]'$  dyspnea on exertion  '[ ]'$  leg swelling  CONSTITUTIONAL:  '[ ]'$  fever   '[ ]'$  chills  PHYSICAL EXAM: Vitals:   02/02/22 1101  BP: (!) 157/84  Pulse: 70  Resp: 16  Temp: 97.9 F (36.6 C)  TempSrc: Temporal  Weight: 211 lb (95.7 kg)  Height: '5\' 5"'$  (1.651 m)   GENERAL: The patient is a well-nourished female, in no acute distress. The vital signs are documented above. CARDIOVASCULAR: There is a regular rate and rhythm. PULMONARY: There is good air exchange bilaterally without wheezing or rales. VASCULAR: She has no significant bruising.  She has no significant  leg swelling.   DATA:  VENOUS DUPLEX: I have independently interpreted her venous duplex scan today.  This shows no evidence of DVT in the right lower extremity.  The right great saphenous vein is successfully closed from the distal thigh to within 6 mm of the saphenofemoral junction.  MEDICAL ISSUES:  S/P LASER ABLATION RIGHT GREAT SAPHENOUS VEIN: The patient is doing well status post laser ablation of the right great saphenous vein down to the distal thigh.  She is not having significant symptoms on the left side.  She is having a hard time wearing her thigh-high compression stocking I have encouraged her to elevate her leg is much as possible.  I will see her back as needed.  Deitra Mayo [facility] [phone]

## 2022-03-31 ENCOUNTER — Encounter (HOSPITAL_COMMUNITY): Payer: Self-pay | Admitting: *Deleted

## 2022-03-31 ENCOUNTER — Ambulatory Visit (HOSPITAL_COMMUNITY)
Admission: EM | Admit: 2022-03-31 | Discharge: 2022-03-31 | Disposition: A | Discharge status: Home / Self Care | Payer: Medicare Other | Attending: Physician Assistant | Admitting: Physician Assistant

## 2022-03-31 ENCOUNTER — Ambulatory Visit (INDEPENDENT_AMBULATORY_CARE_PROVIDER_SITE_OTHER): Payer: Medicare Other

## 2022-03-31 DIAGNOSIS — R0789 Other chest pain: Secondary | ICD-10-CM | POA: Diagnosis not present

## 2022-03-31 DIAGNOSIS — R059 Cough, unspecified: Secondary | ICD-10-CM

## 2022-03-31 MED ORDER — ALUM & MAG HYDROXIDE-SIMETH 200-200-20 MG/5ML PO SUSP
30.0000 mL | Freq: Once | ORAL | Status: AC
  Administered 2022-03-31: 30 mL via ORAL

## 2022-03-31 MED ORDER — ALUM & MAG HYDROXIDE-SIMETH 200-200-20 MG/5ML PO SUSP
ORAL | Status: AC
  Filled 2022-03-31: qty 30

## 2022-03-31 NOTE — ED Triage Notes (Signed)
Pt states she has had SOB x 1 week now she has no cold sx, she is having no pain. She doesn't know why she has no hx of asthma, copd. She does have CHF but has been taking all her meds.

## 2022-03-31 NOTE — ED Provider Notes (Signed)
[facility]    CSN: [id number] Arrival date & time: 03/31/22  1721      History   Chief Complaint Chief Complaint  Patient presents with   Shortness of Breath    HPI Michaela Baker is a 71 y.o. female.   Pt complains of pain in her chest on both sides.  Pt denies shortness of breath.  Pt reports pain feels like she needs to belch or have gas.  Pt denies any abdominal pain.  Pt reports she has a history of Chf and family asked her to come in to be seen.  Pt denies cough.  Pt reports her pulse ox is always in the low 90's.  She does not require oxygen.  Pt denies any current swelling.  No fever or chills.  No nausea, vomiting or dairrha.    The history is provided by the patient. No language interpreter was used.  Shortness of Breath Severity:  Moderate Onset quality:  Sudden Timing:  Constant Progression:  Worsening Chronicity:  New Context: not URI   Relieved by:  Nothing Worsened by:  Nothing Associated symptoms: no cough and no fever   Risk factors: no recent surgery     Past Medical History:  Diagnosis Date   CHF (congestive heart failure) ([place])    Hypertension    Hypothyroidism 03/29/2017   Obesity (BMI 30-39.9)    Premature ventricular contractions     Patient Active Problem List   Diagnosis Date Noted   GI bleed 12/27/2021   Premature ventricular contractions    Sepsis ([place]) 03/04/2021   AKI (acute kidney injury) ([place]) 03/03/2021   Obesity (BMI 30-39.9) 03/03/2021   Hyponatremia 03/03/2021   Abnormal LFTs 03/03/2021   Cellulitis of right lower extremity 03/03/2021   Dyspnea 03/29/2017   Hypothyroidism 03/29/2017   OSA (obstructive sleep apnea) 01/03/2017   Left knee pain 01/03/2017   MGUS (monoclonal gammopathy of unknown significance) 07/26/2013   CHF (congestive heart failure) ([place]) 07/22/2013   Hypertension 07/22/2013   Acute exacerbation of CHF (congestive heart failure) ([place]) 07/21/2013    Past Surgical History:  Procedure  Laterality Date   ABDOMINAL HYSTERECTOMY     CHOLECYSTECTOMY     COLONOSCOPY WITH PROPOFOL N/A 12/29/2021   Procedure: COLONOSCOPY WITH PROPOFOL;  Surgeon: Otis Brace, MD;  Location: [facility];  Service: Gastroenterology;  Laterality: N/A;   ENDOVENOUS ABLATION SAPHENOUS VEIN W/ LASER Right 01/26/2022   endovenous laser ablation right greater saphenous vein by Gae Gallop MD   TONSILLECTOMY     TUBAL LIGATION      OB History   No obstetric history on file.      Home Medications    Prior to Admission medications   Medication Sig Start Date End Date Taking? Authorizing Provider  amLODipine (NORVASC) 10 MG tablet Take 10 mg by mouth daily. 02/04/20  Yes [provider]  aspirin EC 81 MG EC tablet Take 1 tablet (81 mg total) by mouth daily. 07/26/13  Yes Caren Griffins, MD  dapagliflozin propanediol (FARXIGA) 10 MG TABS tablet Take 10 mg by mouth daily.   Yes [provider]  furosemide (LASIX) 40 MG tablet Take 40 mg by mouth daily.   Yes [provider]  levothyroxine (SYNTHROID, LEVOTHROID) 25 MCG tablet Take 25 mcg by mouth daily. 02/05/15  Yes [provider]  metoprolol succinate (TOPROL-XL) 50 MG 24 hr tablet Take 50 mg by mouth daily. Take with or immediately following a meal.   Yes [provider]  Multiple Vitamins-Minerals (MULTIVITAMIN WITH MINERALS) tablet Take 2 tablets by mouth daily.   Yes [provider]  Omega-3 Fatty Acids (FISH OIL) 1000 MG CAPS Take 2,000 mg by mouth daily.   Yes [provider]  potassium chloride SA (KLOR-CON M) 20 MEQ tablet Take 20 mEq by mouth daily.   Yes [provider]  rosuvastatin (CRESTOR) 5 MG tablet Take 5 mg by mouth daily. 05/07/21  Yes [provider]  thiamine 100 MG tablet Take 100 mg by mouth daily.   Yes [provider]  acetaminophen (TYLENOL) 500 MG tablet Take 1,000 mg by mouth as needed (pain).    [provider]   doxycycline (VIBRAMYCIN) 100 MG capsule Take 1 capsule (100 mg total) by mouth 2 (two) times daily. 12/31/21   Oswald Hillock, MD  LORazepam (ATIVAN) 1 MG tablet Take 1 tablet 30 minutes prior to leaving house on day of office surgery.  Bring second tablet with you to office on day of office surgery. Patient not taking: Reported on 02/02/2022 01/18/22   Angelia Mould, MD  mupirocin ointment (BACTROBAN) 2 % Apply 1 application to affected area topically 2 times daily. Patient taking differently: Apply 1 Application topically 2 (two) times daily. To foot 06/15/21   Hyatt, Max T, DPM    Family History Family History  Problem Relation Age of Onset   Diabetes Mother    Heart failure Mother    Kidney failure Mother    Kidney failure Sister    Diabetes Sister    Heart attack Sister    Breast cancer Neg Hx     Social History Social History   Tobacco Use   Smoking status: Never   Smokeless tobacco: Never  Vaping Use   Vaping Use: Never used  Substance Use Topics   Alcohol use: No   Drug use: No     Allergies   Patient has no known allergies.   Review of Systems Review of Systems  Constitutional:  Negative for fever.  Respiratory:  Positive for shortness of breath. Negative for cough.   All other systems reviewed and are negative.    Physical Exam Triage Vital Signs ED Triage Vitals  Enc Vitals Group     BP 03/31/22 1749 137/86     Pulse Rate 03/31/22 1749 82     Resp 03/31/22 1749 20     Temp 03/31/22 1749 99.2 F (37.3 C)     Temp Source 03/31/22 1749 Oral     SpO2 03/31/22 1749 90 %     Weight --      Height --      Head Circumference --      Peak Flow --      Pain Score 03/31/22 1745 0     Pain Loc --      Pain Edu? --      Excl. in [place]? --    No data found.  Updated Vital Signs BP 137/86 (BP Location: Left Arm)   Pulse 82   Temp 99.2 F (37.3 C) (Oral)   Resp 20   SpO2 90%   Visual Acuity Right Eye Distance:   Left Eye Distance:   Bilateral  Distance:    Right Eye Near:   Left Eye Near:    Bilateral Near:     Physical Exam Vitals and nursing note reviewed.  Constitutional:      Appearance: She is well-developed.  HENT:     Head:  Normocephalic.  Cardiovascular:     Rate and Rhythm: Normal rate and regular rhythm.  Pulmonary:     Effort: Pulmonary effort is normal.  Chest:     Chest wall: No tenderness.  Abdominal:     General: There is no distension.  Musculoskeletal:        General: Normal range of motion.     Cervical back: Normal range of motion.  Skin:    General: Skin is warm.  Neurological:     Mental Status: She is alert and oriented to person, place, and time.      UC Treatments / Results  Labs (all labs ordered are listed, but only abnormal results are displayed) Labs Reviewed - No data to display  EKG   Radiology DG Chest 2 View  Result Date: 03/31/2022 CLINICAL DATA:  Cough. EXAM: CHEST - 2 VIEW COMPARISON:  Chest radiograph dated 03/12/2021. FINDINGS: There is diffuse chronic antral coarsening and bronchitic changes. Bibasilar atelectasis/scarring. Slight blunting of the costophrenic angles bilaterally, likely related to chronic changes and scarring. Trace bilateral pleural effusions are less likely but not excluded. No focal consolidation or pneumothorax. The cardiac silhouette is within limits. Atherosclerotic calcification of the aorta. Degenerative changes of the spine. No acute osseous pathology. IMPRESSION: 1. No acute cardiopulmonary process. 2. Chronic changes. Electronically Signed   By: Anner Crete M.D.   On: 03/31/2022 18:18    Procedures Procedures (including critical care time)  Medications Ordered in UC Medications  alum & mag hydroxide-simeth (MAALOX/MYLANTA) 200-200-20 MG/5ML suspension 30 mL (30 mLs Oral Given 03/31/22 1851)    Initial Impression / Assessment and Plan / UC Course  I have reviewed the triage vital signs and the nursing notes.  Pertinent labs & imaging  results that were available during my care of the patient were reviewed by me and considered in my medical decision making (see chart for details).    EKG  no acute abnormality,  Chest xray  no acute,  Pt given a gi cocktail with some relief.  Pt advised to go to the Ed if symptoms worsen or change.   Final Clinical Impressions(s) / UC Diagnoses   Final diagnoses:  Chest pain, non-cardiac     Discharge Instructions      Try taking mylanta for discomfort.  See your Physician for recheck.  Go to the Emergency department if symptoms worsen or change.    ED Prescriptions   None    PDMP not reviewed this encounter. An After Visit Summary was printed and given to the patient.       Fransico Meadow, [place] 04/02/22 1741

## 2022-03-31 NOTE — Discharge Instructions (Addendum)
Try taking mylanta for discomfort.  See your Physician for recheck.  Go to the Emergency department if symptoms worsen or change.

## 2022-05-16 DIAGNOSIS — D649 Anemia, unspecified: Secondary | ICD-10-CM | POA: Insufficient documentation

## 2022-05-16 DIAGNOSIS — E1122 Type 2 diabetes mellitus with diabetic chronic kidney disease: Secondary | ICD-10-CM | POA: Insufficient documentation

## 2022-05-31 ENCOUNTER — Inpatient Hospital Stay (HOSPITAL_COMMUNITY)
Admission: EM | Admit: 2022-05-31 | Discharge: 2022-06-04 | DRG: 291 | Disposition: A | Discharge status: Home / Self Care | Payer: Medicare Other | Attending: Internal Medicine | Admitting: Internal Medicine

## 2022-05-31 ENCOUNTER — Emergency Department (HOSPITAL_COMMUNITY): Payer: Medicare Other

## 2022-05-31 ENCOUNTER — Ambulatory Visit (INDEPENDENT_AMBULATORY_CARE_PROVIDER_SITE_OTHER)
Admission: EM | Admit: 2022-05-31 | Discharge: 2022-05-31 | Disposition: A | Discharge status: Other Acute Inpatient Hospital | Payer: Medicare Other | Source: Home / Self Care

## 2022-05-31 DIAGNOSIS — Z833 Family history of diabetes mellitus: Secondary | ICD-10-CM

## 2022-05-31 DIAGNOSIS — Z7984 Long term (current) use of oral hypoglycemic drugs: Secondary | ICD-10-CM

## 2022-05-31 DIAGNOSIS — I110 Hypertensive heart disease with heart failure: Principal | ICD-10-CM | POA: Diagnosis present

## 2022-05-31 DIAGNOSIS — I071 Rheumatic tricuspid insufficiency: Secondary | ICD-10-CM | POA: Diagnosis present

## 2022-05-31 DIAGNOSIS — R809 Proteinuria, unspecified: Secondary | ICD-10-CM | POA: Diagnosis present

## 2022-05-31 DIAGNOSIS — J9601 Acute respiratory failure with hypoxia: Secondary | ICD-10-CM | POA: Diagnosis present

## 2022-05-31 DIAGNOSIS — Z7982 Long term (current) use of aspirin: Secondary | ICD-10-CM

## 2022-05-31 DIAGNOSIS — R001 Bradycardia, unspecified: Secondary | ICD-10-CM | POA: Diagnosis present

## 2022-05-31 DIAGNOSIS — I5033 Acute on chronic diastolic (congestive) heart failure: Secondary | ICD-10-CM | POA: Diagnosis present

## 2022-05-31 DIAGNOSIS — Z841 Family history of disorders of kidney and ureter: Secondary | ICD-10-CM

## 2022-05-31 DIAGNOSIS — I509 Heart failure, unspecified: Secondary | ICD-10-CM | POA: Insufficient documentation

## 2022-05-31 DIAGNOSIS — R0902 Hypoxemia: Secondary | ICD-10-CM

## 2022-05-31 DIAGNOSIS — Z1152 Encounter for screening for COVID-19: Secondary | ICD-10-CM

## 2022-05-31 DIAGNOSIS — Z8249 Family history of ischemic heart disease and other diseases of the circulatory system: Secondary | ICD-10-CM

## 2022-05-31 DIAGNOSIS — R81 Glycosuria: Secondary | ICD-10-CM | POA: Diagnosis present

## 2022-05-31 DIAGNOSIS — I272 Pulmonary hypertension, unspecified: Secondary | ICD-10-CM | POA: Diagnosis present

## 2022-05-31 DIAGNOSIS — Z79890 Hormone replacement therapy (postmenopausal): Secondary | ICD-10-CM

## 2022-05-31 DIAGNOSIS — E039 Hypothyroidism, unspecified: Secondary | ICD-10-CM | POA: Diagnosis present

## 2022-05-31 DIAGNOSIS — E669 Obesity, unspecified: Secondary | ICD-10-CM | POA: Diagnosis present

## 2022-05-31 DIAGNOSIS — I10 Essential (primary) hypertension: Secondary | ICD-10-CM | POA: Diagnosis present

## 2022-05-31 DIAGNOSIS — Z9049 Acquired absence of other specified parts of digestive tract: Secondary | ICD-10-CM

## 2022-05-31 DIAGNOSIS — N179 Acute kidney failure, unspecified: Secondary | ICD-10-CM | POA: Diagnosis present

## 2022-05-31 DIAGNOSIS — Z6834 Body mass index (BMI) 34.0-34.9, adult: Secondary | ICD-10-CM

## 2022-05-31 DIAGNOSIS — R0602 Shortness of breath: Secondary | ICD-10-CM | POA: Diagnosis not present

## 2022-05-31 DIAGNOSIS — Z79899 Other long term (current) drug therapy: Secondary | ICD-10-CM

## 2022-05-31 LAB — CBC WITH DIFFERENTIAL/PLATELET
Abs Immature Granulocytes: 0.01 10*3/uL (ref 0.00–0.07)
Basophils Absolute: 0 10*3/uL (ref 0.0–0.1)
Basophils Relative: 0 %
Eosinophils Absolute: 0.1 10*3/uL (ref 0.0–0.5)
Eosinophils Relative: 2 %
HCT: 40.5 % (ref 36.0–46.0)
Hemoglobin: 11.4 g/dL — ABNORMAL LOW (ref 12.0–15.0)
Immature Granulocytes: 0 %
Lymphocytes Relative: 23 %
Lymphs Abs: 0.9 10*3/uL (ref 0.7–4.0)
MCH: 21.4 pg — ABNORMAL LOW (ref 26.0–34.0)
MCHC: 28.1 g/dL — ABNORMAL LOW (ref 30.0–36.0)
MCV: 76.1 fL — ABNORMAL LOW (ref 80.0–100.0)
Monocytes Absolute: 0.4 10*3/uL (ref 0.1–1.0)
Monocytes Relative: 9 %
Neutro Abs: 2.7 10*3/uL (ref 1.7–7.7)
Neutrophils Relative %: 66 %
Platelets: 235 10*3/uL (ref 150–400)
RBC: 5.32 MIL/uL — ABNORMAL HIGH (ref 3.87–5.11)
RDW: 22 % — ABNORMAL HIGH (ref 11.5–15.5)
WBC: 4.1 10*3/uL (ref 4.0–10.5)
nRBC: 0 % (ref 0.0–0.2)

## 2022-05-31 LAB — BASIC METABOLIC PANEL
Anion gap: 8 (ref 5–15)
BUN: 16 mg/dL (ref 8–23)
CO2: 25 mmol/L (ref 22–32)
Calcium: 8.4 mg/dL — ABNORMAL LOW (ref 8.9–10.3)
Chloride: 101 mmol/L (ref 98–111)
Creatinine, Ser: 1.44 mg/dL — ABNORMAL HIGH (ref 0.44–1.00)
GFR, Estimated: 39 mL/min — ABNORMAL LOW (ref >60)
Glucose, Bld: 108 mg/dL — ABNORMAL HIGH (ref 70–99)
Potassium: 3.7 mmol/L (ref 3.5–5.1)
Sodium: 134 mmol/L — ABNORMAL LOW (ref 135–145)

## 2022-05-31 LAB — RESP PANEL BY RT-PCR (RSV, FLU A&B, COVID)  RVPGX2
Influenza A by PCR: NEGATIVE
Influenza B by PCR: NEGATIVE
Resp Syncytial Virus by PCR: NEGATIVE
SARS Coronavirus 2 by RT PCR: NEGATIVE

## 2022-05-31 LAB — BRAIN NATRIURETIC PEPTIDE: B Natriuretic Peptide: 157.3 pg/mL — ABNORMAL HIGH (ref 0.0–100.0)

## 2022-05-31 LAB — TROPONIN I (HIGH SENSITIVITY): Troponin I (High Sensitivity): 10 ng/L (ref <18)

## 2022-05-31 NOTE — ED Triage Notes (Signed)
Pt presents with shortness of breath since yesterday.

## 2022-05-31 NOTE — ED Provider Triage Note (Signed)
Emergency Medicine Provider Triage Evaluation Note  Michaela Baker , a 72 y.o. female  was evaluated in triage.  Pt complains of shortness of breath and fatigue.  Started about a week ago.  Has progressively worsened.  Was seen at urgent care today for this complaint.  O2 saturations were low prompting her to be brought by EMS here to the ED.  Sats dipped in the low 80s during this encounter.  Placed on 2 L of oxygen and sats improved to the low 90s.  Patient has known history of CHF.  Patient is compliant with her diuresis management.  Patient also mention some congestion for the last couple days.  Denies sick contacts.  No O2 home requirement.  Review of Systems  Positive: See above Negative: See above  Physical Exam  BP (!) 112/51 (BP Location: Left Arm)   Pulse 77   Temp 97.8 F (36.6 C) (Oral)   Resp 14   SpO2 (!) 86%  Gen:   Awake, no distress   Resp:  Normal effort, now on 2 L, breath sounds mildly diminished but otherwise CTAB MSK:   Moves extremities without difficulty  Other:    Medical Decision Making  Medically screening exam initiated at 3:03 PM.  Appropriate orders placed.  TERRIANA BARRERAS was informed that the remainder of the evaluation will be completed by another provider, this initial triage assessment does not replace that evaluation, and the importance of remaining in the ED until their evaluation is complete.  Work up started   Harriet Pho, PA-C 05/31/22 1505

## 2022-05-31 NOTE — ED Provider Notes (Signed)
[facility]    CSN: [id number] Arrival date & time: 05/31/22  1326      History   Chief Complaint Chief Complaint  Patient presents with   Shortness of Breath    HPI Michaela Baker is a 72 y.o. female.   Patient here today with daughter for evaluation of shortness of breath and fatigue she has been experiencing over the last few weeks but worsening over the last few days. She reports that she had 2 immunizations at her PCP office about a month ago and has not felt well since that time. She has had some pain to her left lower chest area for the last 2 days. She notes the pain feels like "gas". She states for 2 weeks she has been sleeping a lot more throughout the day than is typical for her. She has not had fever that she is aware of. She has had minimal cough, some sneezing today. She has not had any vomiting or diarrhea.   The history is provided by the patient.  Shortness of Breath Associated symptoms: chest pain   Associated symptoms: no cough, no fever, no headaches and no vomiting     Past Medical History:  Diagnosis Date   CHF (congestive heart failure) ([place])    Hypertension    Hypothyroidism 03/29/2017   Obesity (BMI 30-39.9)    Premature ventricular contractions     Patient Active Problem List   Diagnosis Date Noted   GI bleed 12/27/2021   Premature ventricular contractions    Sepsis ([place]) 03/04/2021   AKI (acute kidney injury) ([place]) 03/03/2021   Obesity (BMI 30-39.9) 03/03/2021   Hyponatremia 03/03/2021   Abnormal LFTs 03/03/2021   Cellulitis of right lower extremity 03/03/2021   Dyspnea 03/29/2017   Hypothyroidism 03/29/2017   OSA (obstructive sleep apnea) 01/03/2017   Left knee pain 01/03/2017   MGUS (monoclonal gammopathy of unknown significance) 07/26/2013   CHF (congestive heart failure) ([place]) 07/22/2013   Hypertension 07/22/2013   Acute exacerbation of CHF (congestive heart failure) ([place]) 07/21/2013    Past Surgical History:   Procedure Laterality Date   ABDOMINAL HYSTERECTOMY     CHOLECYSTECTOMY     COLONOSCOPY WITH PROPOFOL N/A 12/29/2021   Procedure: COLONOSCOPY WITH PROPOFOL;  Surgeon: Otis Brace, MD;  Location: [facility];  Service: Gastroenterology;  Laterality: N/A;   ENDOVENOUS ABLATION SAPHENOUS VEIN W/ LASER Right 01/26/2022   endovenous laser ablation right greater saphenous vein by Gae Gallop MD   TONSILLECTOMY     TUBAL LIGATION      OB History   No obstetric history on file.      Home Medications    Prior to Admission medications   Medication Sig Start Date End Date Taking? Authorizing Provider  acetaminophen (TYLENOL) 500 MG tablet Take 1,000 mg by mouth as needed (pain).    [provider]  amLODipine (NORVASC) 10 MG tablet Take 10 mg by mouth daily. 02/04/20   [provider]  aspirin EC 81 MG EC tablet Take 1 tablet (81 mg total) by mouth daily. 07/26/13   Caren Griffins, MD  dapagliflozin propanediol (FARXIGA) 10 MG TABS tablet Take 10 mg by mouth daily.    [provider]  doxycycline (VIBRAMYCIN) 100 MG capsule Take 1 capsule (100 mg total) by mouth 2 (two) times daily. 12/31/21   Oswald Hillock, MD  furosemide (LASIX) 40 MG tablet Take 40 mg by mouth daily.    [provider]  levothyroxine (SYNTHROID, [place])  25 MCG tablet Take 25 mcg by mouth daily. 02/05/15   [provider]  LORazepam (ATIVAN) 1 MG tablet Take 1 tablet 30 minutes prior to leaving house on day of office surgery.  Bring second tablet with you to office on day of office surgery. Patient not taking: Reported on 02/02/2022 01/18/22   Angelia Mould, MD  metoprolol succinate (TOPROL-XL) 50 MG 24 hr tablet Take 50 mg by mouth daily. Take with or immediately following a meal.    [provider]  Multiple Vitamins-Minerals (MULTIVITAMIN WITH MINERALS) tablet Take 2 tablets by mouth daily.    [provider]  mupirocin ointment (BACTROBAN) 2 %  Apply 1 application to affected area topically 2 times daily. Patient taking differently: Apply 1 Application topically 2 (two) times daily. To foot 06/15/21   Hyatt, Max T, DPM  Omega-3 Fatty Acids (FISH OIL) 1000 MG CAPS Take 2,000 mg by mouth daily.    [provider]  potassium chloride SA (KLOR-CON M) 20 MEQ tablet Take 20 mEq by mouth daily.    [provider]  rosuvastatin (CRESTOR) 5 MG tablet Take 5 mg by mouth daily. 05/07/21   [provider]  thiamine 100 MG tablet Take 100 mg by mouth daily.    [provider]    Family History Family History  Problem Relation Age of Onset   Diabetes Mother    Heart failure Mother    Kidney failure Mother    Kidney failure Sister    Diabetes Sister    Heart attack Sister    Breast cancer Neg Hx     Social History Social History   Tobacco Use   Smoking status: Never   Smokeless tobacco: Never  Vaping Use   Vaping Use: Never used  Substance Use Topics   Alcohol use: No   Drug use: No     Allergies   Patient has no known allergies.   Review of Systems Review of Systems  Constitutional:  Positive for activity change and fatigue. Negative for chills and fever.  HENT:  Negative for congestion and rhinorrhea.   Eyes:  Negative for discharge and redness.  Respiratory:  Positive for shortness of breath. Negative for cough.   Cardiovascular:  Positive for chest pain.  Gastrointestinal:  Negative for diarrhea, nausea and vomiting.  Neurological:  Negative for light-headedness, numbness and headaches.     Physical Exam Triage Vital Signs ED Triage Vitals  Enc Vitals Group     BP 05/31/22 1343 (!) 123/52     Pulse Rate 05/31/22 1343 (!) 50     Resp 05/31/22 1343 18     Temp 05/31/22 1343 97.7 F (36.5 C)     Temp Source 05/31/22 1343 Oral     SpO2 05/31/22 1343 (!) 82 %     Weight --      Height --      Head Circumference --      Peak Flow --      Pain Score 05/31/22 1347 0     Pain  Loc --      Pain Edu? --      Excl. in [place]? --    No data found.  Updated Vital Signs BP (!) 123/52 (BP Location: Left Arm)   Pulse (!) 50   Temp 97.7 F (36.5 C) (Oral)   Resp 18   SpO2 95%      Physical Exam Vitals and nursing note reviewed.  Constitutional:  General: She is in acute distress (hypoxic).     Appearance: She is not ill-appearing.  HENT:     Head: Normocephalic and atraumatic.     Nose: Nose normal. No congestion or rhinorrhea.  Eyes:     Conjunctiva/sclera: Conjunctivae normal.  Cardiovascular:     Rate and Rhythm: Normal rate. Rhythm irregular.     Comments: Frequent ectopy Pulmonary:     Comments: Breath sounds mildly diminished throughout Neurological:     Mental Status: She is alert.  Psychiatric:        Mood and Affect: Mood normal.        Behavior: Behavior normal.      UC Treatments / Results  Labs (all labs ordered are listed, but only abnormal results are displayed) Labs Reviewed - No data to display  EKG   Radiology No results found.  Procedures Procedures (including critical care time)  Medications Ordered in UC Medications - No data to display  Initial Impression / Assessment and Plan / UC Course  I have reviewed the triage vital signs and the nursing notes.  Pertinent labs & imaging results that were available during my care of the patient were reviewed by me and considered in my medical decision making (see chart for details).   Patient found to be hypoxic on exam- oxygen in the 80s after walking to exam room. Required supplemental oxygen at 6L/ min to restore oxygen to low 90s. She typically is around 92-93% on room air at baseline. After removal of oxygen supplementation she does maintain oxygen in low 90s as long as she is sitting. EMS removes oxygen while in room.  They have her stand- no palpitations, shortness of breath noted, lightheadedness when standing. Oxygen drops to 89% while standing. After walking around the  room twice oxygen drops to 87%. Care transferred to [facility].      Final Clinical Impressions(s) / UC Diagnoses   Final diagnoses:  Hypoxic  Congestive heart failure, unspecified HF chronicity, unspecified heart failure type [facility])   Discharge Instructions   None    ED Prescriptions   None       Francene Finders, PA-C 05/31/22 1417

## 2022-05-31 NOTE — ED Notes (Signed)
Patient is being discharged from the Urgent Care and sent to the Emergency Department via [facility] . Per Provider Ewell Poe, patient is in need of higher level of care due to shortness of breath & low oxygen. Patient is aware and verbalizes understanding of plan of care.    Vitals:   05/31/22 1348 05/31/22 1354  BP:    Pulse:    Resp:    Temp:    SpO2: (!) 85% 95%

## 2022-05-31 NOTE — ED Triage Notes (Signed)
Patient BIB [facility] from an urgent care for evaluation of shortness of breath and fatigue that started several days ago. SpO2 94% on room air, lowers to 88% with ambulation. History of CHF, patient is alert, oriented, and in no apparent distress at this time.

## 2022-06-01 ENCOUNTER — Observation Stay (HOSPITAL_COMMUNITY): Payer: Medicare Other

## 2022-06-01 DIAGNOSIS — I272 Pulmonary hypertension, unspecified: Secondary | ICD-10-CM | POA: Insufficient documentation

## 2022-06-01 DIAGNOSIS — E039 Hypothyroidism, unspecified: Secondary | ICD-10-CM | POA: Diagnosis not present

## 2022-06-01 DIAGNOSIS — N179 Acute kidney failure, unspecified: Secondary | ICD-10-CM

## 2022-06-01 DIAGNOSIS — I5033 Acute on chronic diastolic (congestive) heart failure: Secondary | ICD-10-CM | POA: Diagnosis not present

## 2022-06-01 DIAGNOSIS — I071 Rheumatic tricuspid insufficiency: Secondary | ICD-10-CM | POA: Insufficient documentation

## 2022-06-01 LAB — T4, FREE: Free T4: 1.35 ng/dL — ABNORMAL HIGH (ref 0.61–1.12)

## 2022-06-01 LAB — MAGNESIUM: Magnesium: 2.3 mg/dL (ref 1.7–2.4)

## 2022-06-01 LAB — HEMOGLOBIN A1C
Hgb A1c MFr Bld: 5.6 % (ref 4.8–5.6)
Mean Plasma Glucose: 114 mg/dL

## 2022-06-01 LAB — TSH: TSH: <0.01 u[IU]/mL — ABNORMAL LOW (ref 0.350–4.500)

## 2022-06-01 LAB — TROPONIN I (HIGH SENSITIVITY): Troponin I (High Sensitivity): 6 ng/L (ref <18)

## 2022-06-01 MED ORDER — FUROSEMIDE 10 MG/ML IJ SOLN
40.0000 mg | Freq: Two times a day (BID) | INTRAMUSCULAR | Status: AC
  Administered 2022-06-01 – 2022-06-02 (×3): 40 mg via INTRAVENOUS
  Filled 2022-06-01 (×3): qty 4

## 2022-06-01 MED ORDER — ROSUVASTATIN CALCIUM 5 MG PO TABS
5.0000 mg | ORAL_TABLET | Freq: Every day | ORAL | Status: DC
  Administered 2022-06-01 – 2022-06-04 (×4): 5 mg via ORAL
  Filled 2022-06-01 (×4): qty 1

## 2022-06-01 MED ORDER — ENOXAPARIN SODIUM 40 MG/0.4ML IJ SOSY
40.0000 mg | PREFILLED_SYRINGE | INTRAMUSCULAR | Status: DC
  Administered 2022-06-01 – 2022-06-03 (×3): 40 mg via SUBCUTANEOUS
  Filled 2022-06-01 (×3): qty 0.4

## 2022-06-01 MED ORDER — AMLODIPINE BESYLATE 10 MG PO TABS
10.0000 mg | ORAL_TABLET | Freq: Every day | ORAL | Status: DC
  Administered 2022-06-01 – 2022-06-04 (×4): 10 mg via ORAL
  Filled 2022-06-01 (×2): qty 1
  Filled 2022-06-01: qty 2
  Filled 2022-06-01: qty 1

## 2022-06-01 MED ORDER — PERFLUTREN LIPID MICROSPHERE
1.0000 mL | INTRAVENOUS | Status: AC | PRN
  Administered 2022-06-01: 2 mL via INTRAVENOUS

## 2022-06-01 MED ORDER — ACETAMINOPHEN 650 MG RE SUPP: 650.0000 mg | Freq: Four times a day (QID) | RECTAL | Status: DC | PRN

## 2022-06-01 MED ORDER — LEVOTHYROXINE SODIUM 25 MCG PO TABS
12.5000 ug | ORAL_TABLET | Freq: Every day | ORAL | Status: DC
  Administered 2022-06-02 – 2022-06-04 (×3): 12.5 ug via ORAL
  Filled 2022-06-01 (×3): qty 1

## 2022-06-01 MED ORDER — DAPAGLIFLOZIN PROPANEDIOL 10 MG PO TABS
10.0000 mg | ORAL_TABLET | Freq: Every day | ORAL | Status: DC
  Administered 2022-06-01 – 2022-06-04 (×4): 10 mg via ORAL
  Filled 2022-06-01 (×4): qty 1

## 2022-06-01 MED ORDER — POLYETHYLENE GLYCOL 3350 17 G PO PACK: 17.0000 g | PACK | Freq: Every day | ORAL | Status: DC | PRN

## 2022-06-01 MED ORDER — POTASSIUM CHLORIDE CRYS ER 20 MEQ PO TBCR
40.0000 meq | EXTENDED_RELEASE_TABLET | Freq: Once | ORAL | Status: AC
  Administered 2022-06-01: 40 meq via ORAL
  Filled 2022-06-01: qty 2

## 2022-06-01 MED ORDER — THIAMINE HCL 100 MG PO TABS
100.0000 mg | ORAL_TABLET | Freq: Every day | ORAL | Status: DC
  Administered 2022-06-01 – 2022-06-04 (×4): 100 mg via ORAL
  Filled 2022-06-01 (×7): qty 1

## 2022-06-01 MED ORDER — FUROSEMIDE 10 MG/ML IJ SOLN
40.0000 mg | Freq: Once | INTRAMUSCULAR | Status: AC
  Administered 2022-06-01: 40 mg via INTRAVENOUS
  Filled 2022-06-01: qty 4

## 2022-06-01 MED ORDER — ASPIRIN 81 MG PO TBEC
81.0000 mg | DELAYED_RELEASE_TABLET | Freq: Every day | ORAL | Status: DC
  Administered 2022-06-01 – 2022-06-04 (×4): 81 mg via ORAL
  Filled 2022-06-01 (×4): qty 1

## 2022-06-01 MED ORDER — ACETAMINOPHEN 325 MG PO TABS
650.0000 mg | ORAL_TABLET | Freq: Four times a day (QID) | ORAL | Status: DC | PRN
  Administered 2022-06-02: 650 mg via ORAL
  Filled 2022-06-01: qty 2

## 2022-06-01 NOTE — Progress Notes (Signed)
  Echocardiogram 2D Echocardiogram has been performed.  Michaela Baker 06/01/2022, 12:40 PM

## 2022-06-01 NOTE — ED Provider Notes (Addendum)
[facility] EMERGENCY DEPARTMENT Provider Note  CSN: [id number] Arrival date & time: 05/31/22 1447  Chief Complaint(s) Fatigue  HPI Michaela Baker is a 72 y.o. female with history of CHF, hyper tension, hypothyroidism presenting to the emergency department shortness of breath.  Patient reported due to fatigue for around a week as well as shortness of breath and some left lower chest pain.  She initially went to an urgent care where she was found to be hypoxic to 82% and referred to the emergency department.  Patient reports she has been compliant with her medications.  Patient denies any exertional or pleuritic nature to her pain.  Shortness of breath is worse when lying flat or with exertion.  No fevers or chills, cough, productive cough.  She denies using oxygen at home.   Past Medical History Past Medical History:  Diagnosis Date   CHF (congestive heart failure) ([place])    Hypertension    Hypothyroidism 03/29/2017   Obesity (BMI 30-39.9)    Premature ventricular contractions    Patient Active Problem List   Diagnosis Date Noted   GI bleed 12/27/2021   Premature ventricular contractions    Sepsis ([place]) 03/04/2021   AKI (acute kidney injury) ([place]) 03/03/2021   Obesity (BMI 30-39.9) 03/03/2021   Hyponatremia 03/03/2021   Abnormal LFTs 03/03/2021   Cellulitis of right lower extremity 03/03/2021   Dyspnea 03/29/2017   Hypothyroidism 03/29/2017   OSA (obstructive sleep apnea) 01/03/2017   Left knee pain 01/03/2017   MGUS (monoclonal gammopathy of unknown significance) 07/26/2013   CHF (congestive heart failure) ([place]) 07/22/2013   Hypertension 07/22/2013   Acute exacerbation of CHF (congestive heart failure) ([place]) 07/21/2013   Home Medication(s) Prior to Admission medications   Medication Sig Start Date End Date Taking? Authorizing Provider  acetaminophen (TYLENOL) 500 MG tablet Take 1,000 mg by mouth as needed (pain).    [provider]  amLODipine  (NORVASC) 10 MG tablet Take 10 mg by mouth daily. 02/04/20   [provider]  aspirin EC 81 MG EC tablet Take 1 tablet (81 mg total) by mouth daily. 07/26/13   Caren Griffins, MD  dapagliflozin propanediol (FARXIGA) 10 MG TABS tablet Take 10 mg by mouth daily.    [provider]  doxycycline (VIBRAMYCIN) 100 MG capsule Take 1 capsule (100 mg total) by mouth 2 (two) times daily. 12/31/21   Oswald Hillock, MD  furosemide (LASIX) 40 MG tablet Take 40 mg by mouth daily.    [provider]  levothyroxine (SYNTHROID, LEVOTHROID) 25 MCG tablet Take 25 mcg by mouth daily. 02/05/15   [provider]  LORazepam (ATIVAN) 1 MG tablet Take 1 tablet 30 minutes prior to leaving house on day of office surgery.  Bring second tablet with you to office on day of office surgery. Patient not taking: Reported on 02/02/2022 01/18/22   Angelia Mould, MD  metoprolol succinate (TOPROL-XL) 50 MG 24 hr tablet Take 50 mg by mouth daily. Take with or immediately following a meal.    [provider]  Multiple Vitamins-Minerals (MULTIVITAMIN WITH MINERALS) tablet Take 2 tablets by mouth daily.    [provider]  mupirocin ointment (BACTROBAN) 2 % Apply 1 application to affected area topically 2 times daily. Patient taking differently: Apply 1 Application topically 2 (two) times daily. To foot 06/15/21   Hyatt, Max T, DPM  Omega-3 Fatty Acids (FISH OIL) 1000 MG CAPS Take 2,000 mg by mouth daily.    [provider]  potassium chloride SA (KLOR-CON M) 20 MEQ tablet Take 20 mEq by mouth daily.    [provider]  rosuvastatin (CRESTOR) 5 MG tablet Take 5 mg by mouth daily. 05/07/21   [provider]  thiamine 100 MG tablet Take 100 mg by mouth daily.    [provider]                                                                                                                                    Past Surgical History Past Surgical History:   Procedure Laterality Date   ABDOMINAL HYSTERECTOMY     CHOLECYSTECTOMY     COLONOSCOPY WITH PROPOFOL N/A 12/29/2021   Procedure: COLONOSCOPY WITH PROPOFOL;  Surgeon: Otis Brace, MD;  Location: [facility];  Service: Gastroenterology;  Laterality: N/A;   ENDOVENOUS ABLATION SAPHENOUS VEIN W/ LASER Right 01/26/2022   endovenous laser ablation right greater saphenous vein by Gae Gallop MD   TONSILLECTOMY     TUBAL LIGATION     Family History Family History  Problem Relation Age of Onset   Diabetes Mother    Heart failure Mother    Kidney failure Mother    Kidney failure Sister    Diabetes Sister    Heart attack Sister    Breast cancer Neg Hx     Social History Social History   Tobacco Use   Smoking status: Never   Smokeless tobacco: Never  Vaping Use   Vaping Use: Never used  Substance Use Topics   Alcohol use: No   Drug use: No   Allergies Patient has no known allergies.  Review of Systems Review of Systems  All other systems reviewed and are negative.   Physical Exam Vital Signs  I have reviewed the triage vital signs BP 124/69   Pulse (!) 59   Temp 97.6 F (36.4 C)   Resp 18   SpO2 99%  Physical Exam Vitals and nursing note reviewed.  Constitutional:      General: She is not in acute distress.    Appearance: She is well-developed.  HENT:     Head: Normocephalic and atraumatic.     Mouth/Throat:     Mouth: Mucous membranes are moist.  Eyes:     Pupils: Pupils are equal, round, and reactive to light.  Cardiovascular:     Rate and Rhythm: Normal rate and regular rhythm.     Heart sounds: No murmur heard. Pulmonary:     Effort: Pulmonary effort is normal. No respiratory distress.     Breath sounds: Rales (bibasilar) present.  Abdominal:     General: Abdomen is flat.     Palpations: Abdomen is soft.     Tenderness: There is no abdominal tenderness.  Musculoskeletal:        General: No tenderness.     Right lower leg: 2+ Pitting Edema  present.     Left lower  leg: 2+ Pitting Edema present.  Skin:    General: Skin is warm and dry.  Neurological:     General: No focal deficit present.     Mental Status: She is alert. Mental status is at baseline.  Psychiatric:        Mood and Affect: Mood normal.        Behavior: Behavior normal.     ED Results and Treatments Labs (all labs ordered are listed, but only abnormal results are displayed) Labs Reviewed  BASIC METABOLIC PANEL - Abnormal; Notable for the following components:      Result Value   Sodium 134 (*)    Glucose, Bld 108 (*)    Creatinine, Ser 1.44 (*)    Calcium 8.4 (*)    GFR, Estimated 39 (*)    All other components within normal limits  CBC WITH DIFFERENTIAL/PLATELET - Abnormal; Notable for the following components:   RBC 5.32 (*)    Hemoglobin 11.4 (*)    MCV 76.1 (*)    MCH 21.4 (*)    MCHC 28.1 (*)    RDW 22.0 (*)    All other components within normal limits  BRAIN NATRIURETIC PEPTIDE - Abnormal; Notable for the following components:   B Natriuretic Peptide 157.3 (*)    All other components within normal limits  RESP PANEL BY RT-PCR (RSV, FLU A&B, COVID)  RVPGX2  TROPONIN I (HIGH SENSITIVITY)  TROPONIN I (HIGH SENSITIVITY)                                                                                                                          Radiology DG Chest 1 View  Result Date: 05/31/2022 CLINICAL DATA:  Shortness of breath. EXAM: CHEST  1 VIEW COMPARISON:  March 31, 2022. FINDINGS: Stable cardiomediastinal silhouette. Stable minimal bibasilar subsegmental atelectasis or scarring is noted. Bony thorax is unremarkable. IMPRESSION: Stable minimal bibasilar subsegmental atelectasis or scarring. Electronically Signed   By: Marijo Conception M.D.   On: 05/31/2022 16:15    Pertinent labs & imaging results that were available during my care of the patient were reviewed by me and considered in my medical decision making (see MDM for  details).  Medications Ordered in ED Medications  furosemide (LASIX) injection 40 mg (40 mg Intravenous Given 06/01/22 0953)  Procedures .Critical Care  Performed by: Cristie Hem, MD Authorized by: Cristie Hem, MD   Critical care provider statement:    Critical care time (minutes):  30   Critical care was necessary to treat or prevent imminent or life-threatening deterioration of the following conditions:  Cardiac failure and respiratory failure   Critical care was time spent personally by me on the following activities:  Development of treatment plan with patient or surrogate, discussions with consultants, evaluation of patient's response to treatment, examination of patient, ordering and review of laboratory studies, ordering and review of radiographic studies, ordering and performing treatments and interventions, pulse oximetry, re-evaluation of patient's condition and review of old charts   Care discussed with: admitting provider     (including critical care time)  Medical Decision Making / ED Course   MDM:  73 year old female presenting to the emergency department with shortness of breath and fatigue.  On exam, patient does have bilateral lower extremity edema with bibasilar crackles.  She was found to be hypoxic on arrival to the emergency department and placed on 2 L of oxygen by nasal cannula with improvement.  Patient reports she currently feels well.  Suspect CHF exacerbation, chest x-ray without evidence of focal infiltrate, less likely PE without tachycardia or tachypnea and patient has signs of volume overload as a cause of her symptoms, lower concern for ACS, patient EKG reassuring without acute ST or T wave changes and troponin negative x 2.  No wheezing to suggest COPD.  No sign of pneumothorax on chest x-ray.  Will give dose of  IV diuresis.  Will discuss with the hospitalist for admission given hypoxia.   Clinical Course as of 06/01/22 1027  Wed Jun 01, 2022  1026 Signed out to the internal medicine service.  [WS]    Clinical Course User Index [WS] Cristie Hem, MD     Additional history obtained: -Additional history obtained from ems -External records from outside source obtained and reviewed including: Chart review including previous notes, labs, imaging, consultation notes including urgent care visit earlier today   Lab Tests: -I ordered, reviewed, and interpreted labs.   The pertinent results include:   Labs Reviewed  BASIC METABOLIC PANEL - Abnormal; Notable for the following components:      Result Value   Sodium 134 (*)    Glucose, Bld 108 (*)    Creatinine, Ser 1.44 (*)    Calcium 8.4 (*)    GFR, Estimated 39 (*)    All other components within normal limits  CBC WITH DIFFERENTIAL/PLATELET - Abnormal; Notable for the following components:   RBC 5.32 (*)    Hemoglobin 11.4 (*)    MCV 76.1 (*)    MCH 21.4 (*)    MCHC 28.1 (*)    RDW 22.0 (*)    All other components within normal limits  BRAIN NATRIURETIC PEPTIDE - Abnormal; Notable for the following components:   B Natriuretic Peptide 157.3 (*)    All other components within normal limits  RESP PANEL BY RT-PCR (RSV, FLU A&B, COVID)  RVPGX2  TROPONIN I (HIGH SENSITIVITY)  TROPONIN I (HIGH SENSITIVITY)    Notable for mild elevated creatinine, elevated BNP, normal troponin  EKG  Reviewed in chart - normal  Imaging Studies ordered: I ordered imaging studies including CXR On my interpretation imaging demonstrates no obvious process I independently visualized and interpreted imaging. I agree with the radiologist interpretation   Medicines ordered and prescription drug management: Meds ordered this  encounter  Medications   furosemide (LASIX) injection 40 mg    -I have reviewed the patients home medicines and have made  adjustments as needed   Consultations Obtained: I requested consultation with the IMTS,  and discussed lab and imaging findings as well as pertinent plan - they recommend: admission   Cardiac Monitoring: The patient was maintained on a cardiac monitor.  I personally viewed and interpreted the cardiac monitored which showed an underlying rhythm of: NSR  Reevaluation: After the interventions noted above, I reevaluated the patient and found that they have improved  Co morbidities that complicate the patient evaluation  Past Medical History:  Diagnosis Date   CHF (congestive heart failure) ([place])    Hypertension    Hypothyroidism 03/29/2017   Obesity (BMI 30-39.9)    Premature ventricular contractions       Dispostion: Disposition decision including need for hospitalization was considered, and patient admitted to the hospital.    Final Clinical Impression(s) / ED Diagnoses Final diagnoses:  Acute hypoxic respiratory failure ([place])  Acute on chronic congestive heart failure, unspecified heart failure type [facility])     This chart was dictated using voice recognition software.  Despite best efforts to proofread,  errors can occur which can change the documentation meaning.    Cristie Hem, MD 06/01/22 1027    Cristie Hem, MD 06/12/22 1302

## 2022-06-01 NOTE — Progress Notes (Signed)
Heart Failure Navigator Progress Note  Assessed for Heart & Vascular TOC clinic readiness.  Patient does not meet criteria due to [facility] cardiology patient.   Navigator will sign off at this time.    Earvin Blazier, BSN, RN Heart Failure Nurse Navigator Secure Chat Only   

## 2022-06-01 NOTE — H&P (Addendum)
Date: 06/01/2022               Patient Name:  Michaela Baker MRN: [id number]  DOB: 1951/01/28 Age / Sex: 72 y.o., female   PCP: [name], FNP         Medical Service: Internal Medicine Teaching Service         Attending Physician: Dr. Campbell Riches, MD    First Contact: Dr. Sanjuana Mae Pager: [phone]  Second Contact: Dr. Howie Ill Pager: [phone]       After Hours (After 5p/  First Contact Pager: [phone]  weekends / holidays): Second Contact Pager: [phone]   Chief Complaint: fatigue  History of Present Illness:   Michaela Baker is a 72 year old female living with HTN, hypothyroidism, obesity, PVCs who presented with generalized weakness/fatigue over the past 2 weeks.   States that she received a flu shot and another shot the Friday before Christmas and since that time she has felt very down in terms of her energy. She previously felt energetic and would clean her house regularly, but lately has wanted to stay in bed and not do anything. She denies any cold symptoms, fevers, chills, congestion, chest pain, palpitations, cough, abdominal pain, diarrhea, constipation, urinary changes. She does endorse intermittent SHOB with exertion when she gets up to move around and clean her home. She has not noted orthopnea, states that she sleeps on 1 pillow at home without difficulty. Otherwise, negative ROS.   Given her low energy, her children persuaded her to come for evaluation. She went to UC yesterday and was noted to be hypoxic to 82% and was subsequently advised to come to ED. In the ED, she was noted to have bilateral LE edema, bibasilar crackles, and a new oxygen requirement of 2L [place] (not on any home O2). This prompted IV diuresis per EDP. IMTS asked to admit for CHF exacerbation.   She is compliant with her home medications. Was diagnosed with CHF in 2013 per patient and has since been on lasix, farxiga, metoprolol tartrate for management. Recent ECHO in 2021 with LVEF ~54%,  normal diastolic function, and no RWMA but did show moderate tricuspid regurgitation and pulmonary artery systolic pressure of 38 mmHg. Myocardial perfusion scan at that time with stress LVEF 58%.    Meds:  No current facility-administered medications on file prior to encounter.   Current Outpatient Medications on File Prior to Encounter  Medication Sig Dispense Refill   acetaminophen (TYLENOL) 500 MG tablet Take 1,000 mg by mouth as needed (pain).     amLODipine (NORVASC) 10 MG tablet Take 10 mg by mouth daily.     aspirin EC 81 MG EC tablet Take 1 tablet (81 mg total) by mouth daily. 30 tablet 1   dapagliflozin propanediol (FARXIGA) 10 MG TABS tablet Take 10 mg by mouth daily.     doxycycline (VIBRAMYCIN) 100 MG capsule Take 1 capsule (100 mg total) by mouth 2 (two) times daily. 10 capsule 0   furosemide (LASIX) 40 MG tablet Take 40 mg by mouth daily.     levothyroxine (SYNTHROID, LEVOTHROID) 25 MCG tablet Take 25 mcg by mouth daily.  5   LORazepam (ATIVAN) 1 MG tablet Take 1 tablet 30 minutes prior to leaving house on day of office surgery.  Bring second tablet with you to office on day of office surgery. (Patient not taking: Reported on 02/02/2022) 2 tablet 0   metoprolol succinate (TOPROL-XL) 50 MG 24 hr tablet Take 50 mg  by mouth daily. Take with or immediately following a meal.     Multiple Vitamins-Minerals (MULTIVITAMIN WITH MINERALS) tablet Take 2 tablets by mouth daily.     mupirocin ointment (BACTROBAN) 2 % Apply 1 application to affected area topically 2 times daily. (Patient taking differently: Apply 1 Application topically 2 (two) times daily. To foot) 22 g 0   Omega-3 Fatty Acids (FISH OIL) 1000 MG CAPS Take 2,000 mg by mouth daily.     potassium chloride SA (KLOR-CON M) 20 MEQ tablet Take 20 mEq by mouth daily.     rosuvastatin (CRESTOR) 5 MG tablet Take 5 mg by mouth daily.     thiamine 100 MG tablet Take 100 mg by mouth daily.      Allergies: Allergies as of 05/31/2022    (No Known Allergies)   Past Medical History:  Diagnosis Date   CHF (congestive heart failure) (H[facility])    Hypertension    Hypothyroidism 03/29/2017   Obesity (BMI 30-39.9)    Premature ventricular contractions     Family History:  Family History  Problem Relation Age of Onset   Diabetes Mother    Heart failure Mother    Kidney failure Mother    Kidney failure Sister    Diabetes Sister    Heart attack Sister    Breast cancer Neg Hx     Social History:  -lives alone, independent in ADLs and iADLs -2 children live in [place] -no smoking, alcohol, or recreational drug use history -PCP is Selina Cooley, FNP  Review of Systems: A complete ROS was negative except as per HPI.   Physical Exam: Blood pressure 124/69, pulse (!) 59, temperature 97.6 F (36.4 C), resp. rate 18, SpO2 99 %. Physical Exam Constitutional:      Appearance: Normal appearance. She is obese. She is not ill-appearing.  HENT:     Head: Normocephalic and atraumatic.     Nose: Nose normal. No congestion.     Mouth/Throat:     Mouth: Mucous membranes are moist.     Pharynx: Oropharynx is clear. No oropharyngeal exudate.  Eyes:     Extraocular Movements: Extraocular movements intact.     Pupils: Pupils are equal, round, and reactive to light.  Cardiovascular:     Rate and Rhythm: Normal rate and regular rhythm.     Pulses: Normal pulses.     Heart sounds: Normal heart sounds. No murmur heard.    No gallop.     Comments: No appreciable JVD. Pulmonary:     Effort: Pulmonary effort is normal. No respiratory distress.     Breath sounds: No wheezing.     Comments: Bibasilar crackles noted Abdominal:     General: Bowel sounds are normal. There is no distension.     Palpations: Abdomen is soft.     Tenderness: There is no abdominal tenderness. There is no guarding or rebound.  Musculoskeletal:        General: Normal range of motion.     Comments: 2+ pitting edema in bilateral LE up to ankles, 1+ up to  mid-shin.  Skin:    General: Skin is warm and dry.  Neurological:     General: No focal deficit present.     Mental Status: She is alert and oriented to person, place, and time.  Psychiatric:        Mood and Affect: Mood normal.        Behavior: Behavior normal.     EKG: personally reviewed my interpretation is  normal sinus rhythm  DG Chest 1 View Result Date: 05/31/2022 IMPRESSION: Stable minimal bibasilar subsegmental atelectasis or scarring. Electronically Signed   By: Marijo Conception M.D.   On: 05/31/2022 16:15     Assessment & Plan by Problem: Principal Problem:   Acute exacerbation of CHF (congestive heart failure) (HCC) Active Problems:   Hypertension   Hypothyroidism   Obesity (BMI 30-39.9)  Acute exacerbation of CHF Pulmonary HTN Moderate TR Patient with known history of CHF dating back to 2013 per patient. Recent ECHO in 2021 with LVEF ~12%, normal diastolic function, and no RWMA but did show moderate tricuspid regurgitation and pulmonary artery systolic pressure of 38 mmHg. Myocardial perfusion scan at that time with stress LVEF 58%. She is on GDMT with farxiga, metoprolol tartrate and also on lasix at home, all of which she is compliant with. She does appear to be in CHF exacerbation at this time given bibasilar crackles, bilateral LE edema, and new O2 requirement of 2L [place]. BNP mildly elevated but likely falsely low given obesity. Agree with plan to diurese and assess for improvement. Low suspicion for PE given no tachycardia, tachypnea, pleuritic chest pain, long periods of immobility/recent surgery or travel. Low suspicion for infection given clear CXR, lack of symptoms, afebrile, without leukocytosis. Troponins flat and EKG without acute ischemic changes.  -IV lasix '40mg'$  BID -O2 supplementation to maintain O2 sats >92%, wean as tolerated -replete potassium and Mg as needed (goal K >4.0, Mg >2.0) -f/u ECHO -strict I/O, daily weights -continue home farxiga -holding  toprol-xl due to low HR (59-61) -PT/OT eval -HH diet  AKI Baseline Cr appears to be around 0.8-1, on admission Cr 1.44. Likely prerenal in nature. She does appear fluid overloaded at this time, possible component of cardiorenal syndrome. Will trend kidney function with diuresis. Will continue home farxiga given GFR >30.  -trend kidney function with diuresis -avoid nephrotoxic medications -strict I/Os, monitor UOP  Hypertension Resumed home farxiga and norvasc. Holding home toprol-xl given low HR (59-61), can resume as able. BP at goal currently.  -continue farxiga and norvasc -holding home toprol-xl  Hypothyroidism Per patient, she recently decreased synthroid dose to 12.72mg about 1 month ago as advised by PCP. Checking TSH level and free T4 level given fatigue. -continue synthroid 12.533m daily -f/u TSH, free T4   Dispo: Admit patient to Observation with expected length of stay less than 2 midnights.  Signed: JiVirl AxeMD 06/01/2022, 11:27 AM  Pager: [phone] 5pm on weekdays and 1pm on weekends: On Call pager: [phone]

## 2022-06-01 NOTE — ED Notes (Signed)
Patient resting in a hall bed, no s/s of distress, states "she feels good", a warm blanket provided, will continue to monitor.

## 2022-06-01 NOTE — ED Notes (Signed)
Patient left in stable condition, AOX4, with her belongings and her son, no s/s of distress.  Report given to [facility].

## 2022-06-01 NOTE — Evaluation (Signed)
Physical Therapy Evaluation Patient Details Name: Michaela Baker MRN: [id number] DOB: [birth year] Today's Date: 06/01/2022  History of Present Illness  72 y.o. female presents to [facility] hospital on 05/31/2022 with weakness and fatigue for 2 weeks. Pt found to be hypoxic at urgent care, admitted for CHF exacerbation. PMH includes HTN, CHF, PVCs, hypothyroidism.  Clinical Impression  Pt presents to PT with deficits in activity tolerance and cardiopulmonary function. Pt desaturates with attempts at weaning to room air, and reports SOB and fatigue when mobilizing throughout session. Pt will benefit from aggressive mobilization in an effort to improve activity tolerance. PT anticipates the pt will not require PT services at the time of discharge.       Recommendations for follow up therapy are one component of a multi-disciplinary discharge planning process, led by the attending physician.  Recommendations may be updated based on patient status, additional functional criteria and insurance authorization.  Follow Up Recommendations No PT follow up      Assistance Recommended at Discharge PRN  Patient can return home with the following       Equipment Recommendations None recommended by PT  Recommendations for Other Services       Functional Status Assessment Patient has had a recent decline in their functional status and demonstrates the ability to make significant improvements in function in a reasonable and predictable amount of time.     Precautions / Restrictions Precautions Precautions: Other (comment) Precaution Comments: monitor sats Restrictions Weight Bearing Restrictions: No      Mobility  Bed Mobility Overal bed mobility: Modified Independent             General bed mobility comments: HOB elevated    Transfers Overall transfer level: Independent                      Ambulation/Gait Ambulation/Gait assistance: Modified independent (Device/Increase time) Gait  Distance (Feet): 200 Feet Assistive device: None Gait Pattern/deviations: Step-through pattern, Wide base of support Gait velocity: functional Gait velocity interpretation: 1.31 - 2.62 ft/sec, indicative of limited community ambulator   General Gait Details: slowed step-through gait  Stairs            Wheelchair Mobility    Modified Rankin (Stroke Patients Only)       Balance Overall balance assessment: No apparent balance deficits (not formally assessed)                                           Pertinent Vitals/Pain Pain Assessment Pain Assessment: No/denies pain    Home Living Family/patient expects to be discharged to:: Private residence Living Arrangements: Other relatives (granddaughter) Available Help at Discharge: Family;Available PRN/intermittently Type of Home: Apartment Home Access: Level entry       Home Layout: One level Home Equipment: Conservation officer, nature (2 wheels);Cane - quad;Shower seat      Prior Function Prior Level of Function : Independent/Modified Independent;Driving             Mobility Comments: ambulates without DME       Hand Dominance   Dominant Hand: Right    Extremity/Trunk Assessment   Upper Extremity Assessment Upper Extremity Assessment: Overall WFL for tasks assessed    Lower Extremity Assessment Lower Extremity Assessment: Overall WFL for tasks assessed    Cervical / Trunk Assessment Cervical / Trunk Assessment: Other exceptions Cervical / Trunk Exceptions: morbid  obesity  Communication   Communication: No difficulties  Cognition Arousal/Alertness: Awake/alert Behavior During Therapy: WFL for tasks assessed/performed Overall Cognitive Status: Within Functional Limits for tasks assessed                                          General Comments General comments (skin integrity, edema, etc.): pt on 2L [place] upon PT arrival, pt desats to 78% when weaned to room air and going to  bathroom. Pt recovers on 2L [place] back into 90s with seated rest break. Pt later ambulates on 2L [place] with stable sats in mid 90s    Exercises     Assessment/Plan    PT Assessment Patient needs continued PT services  PT Problem List Cardiopulmonary status limiting activity;Decreased activity tolerance       PT Treatment Interventions Therapeutic activities;Therapeutic exercise;Patient/family education;Gait training;Stair training    PT Goals (Current goals can be found in the Care Plan section)  Acute Rehab PT Goals Patient Stated Goal: to return to independence, improve activity tolerance PT Goal Formulation: With patient Time For Goal Achievement: 06/15/22 Potential to Achieve Goals: Good Additional Goals Additional Goal #1: Pt will ambulate independently for >500' to demonstrate improved tolerance for community mobility Additional Goal #2: Pt will report 1/4 DOE or less when ambulating on room air to demonstrate improved activity tolerance    Frequency Min 2X/week     Co-evaluation               AM-PAC PT "6 Clicks" Mobility  Outcome Measure Help needed turning from your back to your side while in a flat bed without using bedrails?: None Help needed moving from lying on your back to sitting on the side of a flat bed without using bedrails?: None Help needed moving to and from a bed to a chair (including a wheelchair)?: None Help needed standing up from a chair using your arms (e.g., wheelchair or bedside chair)?: None Help needed to walk in hospital room?: None Help needed climbing 3-5 steps with a railing? : A Little 6 Click Score: 23    End of Session Equipment Utilized During Treatment: Oxygen Activity Tolerance: Patient tolerated treatment well Patient left: in chair;with call bell/phone within reach;with family/visitor present Nurse Communication: Mobility status PT Visit Diagnosis: Other abnormalities of gait and mobility (R26.89)    Time: 7948-0165 PT Time  Calculation (min) (ACUTE ONLY): 25 min   Charges:   PT Evaluation $PT Eval Low Complexity: [place], PT, DPT Acute Rehabilitation Office [phone]   Zenaida Niece 06/01/2022, 3:15 PM

## 2022-06-02 ENCOUNTER — Other Ambulatory Visit: Payer: Self-pay

## 2022-06-02 ENCOUNTER — Encounter (HOSPITAL_COMMUNITY): Payer: Self-pay | Admitting: Infectious Diseases

## 2022-06-02 DIAGNOSIS — R809 Proteinuria, unspecified: Secondary | ICD-10-CM | POA: Diagnosis present

## 2022-06-02 DIAGNOSIS — I071 Rheumatic tricuspid insufficiency: Secondary | ICD-10-CM

## 2022-06-02 DIAGNOSIS — N179 Acute kidney failure, unspecified: Secondary | ICD-10-CM | POA: Diagnosis present

## 2022-06-02 DIAGNOSIS — R0602 Shortness of breath: Secondary | ICD-10-CM | POA: Diagnosis present

## 2022-06-02 DIAGNOSIS — Z9049 Acquired absence of other specified parts of digestive tract: Secondary | ICD-10-CM | POA: Diagnosis not present

## 2022-06-02 DIAGNOSIS — I272 Pulmonary hypertension, unspecified: Secondary | ICD-10-CM | POA: Diagnosis present

## 2022-06-02 DIAGNOSIS — Z8249 Family history of ischemic heart disease and other diseases of the circulatory system: Secondary | ICD-10-CM | POA: Diagnosis not present

## 2022-06-02 DIAGNOSIS — R81 Glycosuria: Secondary | ICD-10-CM | POA: Diagnosis present

## 2022-06-02 DIAGNOSIS — Z79890 Hormone replacement therapy (postmenopausal): Secondary | ICD-10-CM | POA: Diagnosis not present

## 2022-06-02 DIAGNOSIS — J9601 Acute respiratory failure with hypoxia: Secondary | ICD-10-CM | POA: Diagnosis present

## 2022-06-02 DIAGNOSIS — I5033 Acute on chronic diastolic (congestive) heart failure: Secondary | ICD-10-CM | POA: Diagnosis present

## 2022-06-02 DIAGNOSIS — R001 Bradycardia, unspecified: Secondary | ICD-10-CM | POA: Diagnosis present

## 2022-06-02 DIAGNOSIS — E039 Hypothyroidism, unspecified: Secondary | ICD-10-CM | POA: Diagnosis present

## 2022-06-02 DIAGNOSIS — I110 Hypertensive heart disease with heart failure: Secondary | ICD-10-CM | POA: Diagnosis present

## 2022-06-02 DIAGNOSIS — Z1152 Encounter for screening for COVID-19: Secondary | ICD-10-CM | POA: Diagnosis not present

## 2022-06-02 DIAGNOSIS — Z7982 Long term (current) use of aspirin: Secondary | ICD-10-CM | POA: Diagnosis not present

## 2022-06-02 DIAGNOSIS — Z841 Family history of disorders of kidney and ureter: Secondary | ICD-10-CM | POA: Diagnosis not present

## 2022-06-02 DIAGNOSIS — Z6834 Body mass index (BMI) 34.0-34.9, adult: Secondary | ICD-10-CM | POA: Diagnosis not present

## 2022-06-02 DIAGNOSIS — E669 Obesity, unspecified: Secondary | ICD-10-CM | POA: Diagnosis present

## 2022-06-02 DIAGNOSIS — Z7984 Long term (current) use of oral hypoglycemic drugs: Secondary | ICD-10-CM | POA: Diagnosis not present

## 2022-06-02 DIAGNOSIS — Z79899 Other long term (current) drug therapy: Secondary | ICD-10-CM | POA: Diagnosis not present

## 2022-06-02 DIAGNOSIS — Z833 Family history of diabetes mellitus: Secondary | ICD-10-CM | POA: Diagnosis not present

## 2022-06-02 LAB — ECHOCARDIOGRAM COMPLETE
Area-P 1/2: 2.06 cm2
Calc EF: 59.6 %
MV M vel: 3.59 m/s
MV Peak grad: 51.6 mmHg
S' Lateral: 3.3 cm
Single Plane A2C EF: 57.6 %
Single Plane A4C EF: 60.8 %

## 2022-06-02 LAB — CBC
HCT: 36.7 % (ref 36.0–46.0)
Hemoglobin: 10.8 g/dL — ABNORMAL LOW (ref 12.0–15.0)
MCH: 22.4 pg — ABNORMAL LOW (ref 26.0–34.0)
MCHC: 29.4 g/dL — ABNORMAL LOW (ref 30.0–36.0)
MCV: 76 fL — ABNORMAL LOW (ref 80.0–100.0)
Platelets: 239 10*3/uL (ref 150–400)
RBC: 4.83 MIL/uL (ref 3.87–5.11)
RDW: 21.6 % — ABNORMAL HIGH (ref 11.5–15.5)
WBC: 6.2 10*3/uL (ref 4.0–10.5)
nRBC: 0 % (ref 0.0–0.2)

## 2022-06-02 LAB — URINALYSIS, COMPLETE (UACMP) WITH MICROSCOPIC
Bacteria, UA: NONE SEEN
Bilirubin Urine: NEGATIVE
Glucose, UA: 50 mg/dL — AB
Ketones, ur: NEGATIVE mg/dL
Nitrite: NEGATIVE
Protein, ur: 100 mg/dL — AB
Specific Gravity, Urine: 1.018 (ref 1.005–1.030)
pH: 5 (ref 5.0–8.0)

## 2022-06-02 LAB — CBG MONITORING, ED: Glucose-Capillary: 80 mg/dL (ref 70–99)

## 2022-06-02 LAB — BASIC METABOLIC PANEL
Anion gap: 10 (ref 5–15)
BUN: 30 mg/dL — ABNORMAL HIGH (ref 8–23)
CO2: 23 mmol/L (ref 22–32)
Calcium: 8.4 mg/dL — ABNORMAL LOW (ref 8.9–10.3)
Chloride: 103 mmol/L (ref 98–111)
Creatinine, Ser: 1.45 mg/dL — ABNORMAL HIGH (ref 0.44–1.00)
GFR, Estimated: 38 mL/min — ABNORMAL LOW (ref >60)
Glucose, Bld: 94 mg/dL (ref 70–99)
Potassium: 4.2 mmol/L (ref 3.5–5.1)
Sodium: 136 mmol/L (ref 135–145)

## 2022-06-02 MED ORDER — FUROSEMIDE 10 MG/ML IJ SOLN
40.0000 mg | Freq: Two times a day (BID) | INTRAMUSCULAR | Status: DC
  Administered 2022-06-03: 40 mg via INTRAVENOUS
  Filled 2022-06-02: qty 4

## 2022-06-02 NOTE — Discharge Summary (Cosign Needed)
Name: Michaela Baker MRN: [id number] DOB: 04-Jun-1950 72 y.o. PCP: [name], FNP  Date of Admission: 05/31/2022  2:52 PM Date of Discharge: 06/04/2022 Attending Physician: Velna Ochs, MD  Discharge Diagnosis: 1. Principal Problem:   Acute exacerbation of CHF (congestive heart failure) (HCC) Active Problems:   Hypertension   Hypothyroidism   AKI (acute kidney injury) ([place])   Obesity (BMI 30-39.9)   Pulmonary hypertension (HCC)   Moderate tricuspid regurgitation   Acute hypoxic respiratory failure ([place])    Discharge Medications: Allergies as of 06/04/2022   No Known Allergies      Medication List     TAKE these medications    acetaminophen 500 MG tablet Commonly known as: TYLENOL Take 1,000 mg by mouth as needed (pain).   amLODipine 10 MG tablet Commonly known as: NORVASC Take 10 mg by mouth daily.   aspirin EC 81 MG tablet Take 1 tablet (81 mg total) by mouth daily.   dapagliflozin propanediol 10 MG Tabs tablet Commonly known as: FARXIGA Take 10 mg by mouth daily.   Fish Oil 1000 MG Caps Take 2,000 mg by mouth daily.   furosemide 40 MG tablet Commonly known as: LASIX Take 1 tablet (40 mg total) by mouth 2 (two) times daily. What changed: when to take this   hydrALAZINE 50 MG tablet Commonly known as: APRESOLINE Take 50 mg by mouth daily.   levothyroxine 25 MCG tablet Commonly known as: SYNTHROID Take 12.5 mcg by mouth daily.   metoprolol succinate 50 MG 24 hr tablet Commonly known as: TOPROL-XL Take 50 mg by mouth daily. Take with or immediately following a meal.   multivitamin with minerals tablet Take 2 tablets by mouth daily.   mupirocin ointment 2 % Commonly known as: BACTROBAN Apply 1 application to affected area topically 2 times daily. What changed: See the new instructions.   potassium chloride SA 20 MEQ tablet Commonly known as: KLOR-CON M Take 20 mEq by mouth daily.   rosuvastatin 5 MG tablet Commonly known as:  CRESTOR Take 5 mg by mouth daily.   thiamine 100 MG tablet Commonly known as: VITAMIN B1 Take 100 mg by mouth daily.        Disposition and follow-up:   Ms.Michaela Baker was discharged from [facility] in Good condition.  At the hospital follow up visit please address:  1.    Heart Failure Exacerbation - please ensure pt is able to ambulate well without dyspnea, as well as checking on lower extremity edema. Repeat echo relatively similar to one done in 2021. Also recommend changing lasix frequency from once daily to twice daily.   AKI- Pt creatinine elevated to 1.32 on day of discharge. Please repeat BMP for evaluation. UA shows mild hematuria and glucosuria, can consider further workup and evaluation at hospital follow up. No decrease in urine output, or dysuria.   2.  Labs / imaging needed at time of follow-up: BMP, Urine-analysis  3.  Pending labs/ test needing follow-up: NA  Follow-up Appointments:  Follow-up Information     [name], FNP Follow up.   Specialty: Nurse Practitioner Why: Please follow up in a week. Contact information: [place] [address] [phone]                 Hospital Course by problem list:  #Acute Exacerbation of CHF  #Pulmonary HTN  #Moderate TR Pt presented with a two week history of fatigue and lower  extremity edema. She went to urgent care and was found to be saturating at 82% on room air. She was then sent here for management. Her last Echo was in 2021 which showed a LVEF of 16%, normal diastolic function, and no RWMA but did show moderate tricuspid regurgitation and pulmonary artery systolic pressure of 10RU. Her home medications are farxiga, metoprolol, and lasix for which she is compliant with. She was given two doses of lasix IV '40mg'$  daily, and was switched to home dose of PO lasix 40 on day of discharge. Would recommend increasing frequency of lasix '40mg'$  PO to BID, and was  able to return to saturate well on room air as well as while ambulating. Metoprolol was initially held due to low heart rate, however pulses returned to normal limits so metoprolol was restarted. Unsure what caused her heart failure exacerbation, however pt is breathing well on room air, and able to ambulate without desaturating below 90% and stable for discharge.    #AKI Baseline creatinine seems to be around .8-1, and on admission Cr was 1.44, and has slowly decreased to 1.32 on day of discharge. Initially was thought to be prerenal in nature. UA was obtained which showed mild proteinuria, mild glucosuria, and small Hgb. She has no trouble urinating, no dysuria, or abdominal pain. Will need evaluation in the outpatient setting.   #Hypothyroidism  Pt takes synthroid 12.24mg in the outpatient setting, TSH and T4 were checked here which showed a TSH of less than .01 and T4 1.35. Can consider discontinuing synthroid due to TSH and T4 levels in the outpatient setting.   Discharge Exam:   BP 108/62 (BP Location: Left Arm)   Pulse 68   Temp 98.1 F (36.7 C) (Oral)   Resp 18   Ht '5\' 5"'$  (1.651 m)   Wt 93.5 kg   SpO2 93%   BMI 34.30 kg/m  Discharge exam:   Constitutional: Obese woman, in no acute destress  HENT: Normocephalic, atraumatic  Cardiovascular: Normal rate and regular rhythm, no JVD  Pulmonary: Lungs clear to auscultation bilaterally  MSK: No edema seen in lower extremities  Pertinent Labs, Studies, and Procedures:     Latest Ref Rng & Units 06/04/2022   12:55 AM 06/03/2022   12:52 AM 06/02/2022    4:27 AM  BMP  Glucose 70 - 99 mg/dL 94  91  94   BUN 8 - 23 mg/dL 32  29  30   Creatinine 0.44 - 1.00 mg/dL 1.32  1.34  1.45   Sodium 135 - 145 mmol/L 136  136  136   Potassium 3.5 - 5.1 mmol/L 3.9  3.7  4.2   Chloride 98 - 111 mmol/L 104  101  103   CO2 22 - 32 mmol/L '25  26  23   '$ Calcium 8.9 - 10.3 mg/dL 8.4  8.8  8.4        Latest Ref Rng & Units 06/04/2022   12:55 AM 06/03/2022    12:52 AM 06/02/2022    4:27 AM  CBC  WBC 4.0 - 10.5 K/uL 4.6  5.3  6.2   Hemoglobin 12.0 - 15.0 g/dL 11.0  11.8  10.8   Hematocrit 36.0 - 46.0 % 36.1  40.7  36.7   Platelets 150 - 400 K/uL 255  282  239     DG Chest 1 View  Result Date: 05/31/2022 CLINICAL DATA:  Shortness of breath. EXAM: CHEST  1 VIEW COMPARISON:  March 31, 2022. FINDINGS: Stable cardiomediastinal  silhouette. Stable minimal bibasilar subsegmental atelectasis or scarring is noted. Bony thorax is unremarkable. IMPRESSION: Stable minimal bibasilar subsegmental atelectasis or scarring. Electronically Signed   By: Marijo Conception M.D.   On: 05/31/2022 16:15     Discharge Instructions: Discharge Instructions     Call MD for:  difficulty breathing, headache or visual disturbances   Complete by: As directed    Call MD for:  persistant dizziness or light-headedness   Complete by: As directed    Call MD for:  persistant nausea and vomiting   Complete by: As directed    Call MD for:  severe uncontrolled pain   Complete by: As directed    Diet - low sodium heart healthy   Complete by: As directed    Increase activity slowly   Complete by: As directed        Signed: Drucie Opitz, MD 06/04/2022, 11:31 AM   Pager: [phone]

## 2022-06-02 NOTE — Evaluation (Signed)
Occupational Therapy Evaluation Patient Details Name: Michaela Baker MRN: [id number] DOB: 03-26-1951 Today's Date: 06/02/2022   History of Present Illness 72 y.o. female presents to [facility] hospital on 05/31/2022 with weakness and fatigue for 2 weeks. Pt found to be hypoxic at urgent care, admitted for CHF exacerbation. PMH includes HTN, CHF, PVCs, hypothyroidism.   Clinical Impression   Pt completes selfcare tasks sit to stand with modified independent level at this time.  Oxygen sats during functional mobility and selfcare were 99% on room air.  No further acute care OT needs at this time or follow-up recommended.        Recommendations for follow up therapy are one component of a multi-disciplinary discharge planning process, led by the attending physician.  Recommendations may be updated based on patient status, additional functional criteria and insurance authorization.   Follow Up Recommendations  No OT follow up     Assistance Recommended at Discharge None     Functional Status Assessment  Patient has had a recent decline in their functional status and demonstrates the ability to make significant improvements in function in a reasonable and predictable amount of time.  Equipment Recommendations  None recommended by OT       Precautions / Restrictions Precautions Precautions: Fall Precaution Comments: monitor sats Restrictions Weight Bearing Restrictions: No      Mobility Bed Mobility                    Transfers Overall transfer level: Modified independent   Transfers: Sit to/from Stand Sit to Stand: Modified independent (Device/Increase time)                  Balance Overall balance assessment: Mild deficits observed, not formally tested                                         ADL either performed or assessed with clinical judgement   ADL Overall ADL's : Needs assistance/impaired Eating/Feeding: Independent   Grooming: Wash/dry  hands;Modified independent Grooming Details (indicate cue type and reason): simulated Upper Body Bathing: Sitting;Modified independent Upper Body Bathing Details (indicate cue type and reason): simulated Lower Body Bathing: Sit to/from stand;Modified independent Lower Body Bathing Details (indicate cue type and reason): simulated Upper Body Dressing : Modified independent Upper Body Dressing Details (indicate cue type and reason): simulated Lower Body Dressing: Sit to/from stand;Modified independent Lower Body Dressing Details (indicate cue type and reason): simulated Toilet Transfer: Ambulation;Modified Independent   Toileting- Clothing Manipulation and Hygiene: Sit to/from stand;Modified independent       Functional mobility during ADLs: Modified independent General ADL Comments: Pt currently modified independent without assistive device.  Feel she could beeven safer with use of a RW for support.  Pt already has 3:1, RW, and shower seat.  Provided education on CHF handout with importance of diet, watching sodium and salt intake, exercising, and monitoring her weight.  She voiced understanding.  Oxygen sats at 99% on room air at rest and with functional mobility.     Vision Baseline Vision/History: 0 No visual deficits Ability to See in Adequate Light: 0 Adequate Patient Visual Report: No change from baseline Vision Assessment?: No apparent visual deficits     Perception  NT   Praxis  NT    Pertinent Vitals/Pain Pain Assessment Pain Assessment: No/denies pain     Hand Dominance Right   Extremity/Trunk  Assessment Upper Extremity Assessment Upper Extremity Assessment: Generalized weakness   Lower Extremity Assessment Lower Extremity Assessment: Defer to PT evaluation   Cervical / Trunk Assessment Cervical / Trunk Assessment: Kyphotic (forward trunk flexion)   Communication Communication Communication: No difficulties   Cognition Arousal/Alertness:  Awake/alert Behavior During Therapy: WFL for tasks assessed/performed Overall Cognitive Status: Within Functional Limits for tasks assessed                                                  Home Living Family/patient expects to be discharged to:: Private residence Living Arrangements: Other relatives (granddaughter comes and stays sometimes) Available Help at Discharge: Family;Available PRN/intermittently Type of Home: Apartment Home Access: Level entry     Home Layout: One level     Bathroom Shower/Tub: Teacher, early years/pre: Handicapped height     Home Equipment: Conservation officer, nature (2 wheels);Cane - quad;Shower seat;BSC/3in1          Prior Functioning/Environment Prior Level of Function : Independent/Modified Independent;Driving             Mobility Comments: ambulates without DME ADLs Comments: pt was modified independent for selfcare, mobility, transfers ect.         AM-PAC OT "6 Clicks" Daily Activity     Outcome Measure Help from another person eating meals?: None Help from another person taking care of personal grooming?: None Help from another person toileting, which includes using toliet, bedpan, or urinal?: None Help from another person bathing (including washing, rinsing, drying)?: None Help from another person to put on and taking off regular upper body clothing?: None Help from another person to put on and taking off regular lower body clothing?: None 6 Click Score: 24   End of Session Nurse Communication: Mobility status;Other (comment) (oxygen sats)  Activity Tolerance: Patient tolerated treatment well Patient left: in chair;with call bell/phone within reach                   Time: 1340-1405 OT Time Calculation (min): 25 min Charges:  OT General Charges $OT Visit: 1 Visit OT Evaluation $OT Eval Moderate Complexity: 1 Mod OT Treatments $Self Care/Home Management : 8-22 mins Bailynn Dyk OTR/L 06/02/2022, 2:45  PM

## 2022-06-02 NOTE — Progress Notes (Signed)
Subjective:   Summary: Michaela Baker is a 72 y.o. year old female currently admitted on the IMTS HD#1 for acute on chronic heart failure exacerbation.  Overnight Events: NOE   Pt was seen today bedside. She states she is feeling much better. Her breathing has improved, and she denies any chest pain, nausea, vomiting, or dyspnea. She says at baseline she gets tired when walking long distances. She has been urinating frequently due to the lasix, and has no complaints.   Objective:  Vital signs in last 24 hours: Vitals:   06/02/22 0900 06/02/22 1004 06/02/22 1202 06/02/22 1417  BP: 116/73 128/72 128/72 91/79  Pulse: 63  63 80  Resp: 16  18   Temp:  98.2 F (36.8 C) 98.2 F (36.8 C) 97.7 F (36.5 C)  TempSrc:  Oral Oral Oral  SpO2: 100% 100% 98% 98%  Weight:   94.3 kg   Height:   '5\' 5"'$  (1.651 m)    Supplemental O2: Room Air 98%   Physical Exam:  Constitutional: well-appearing woman, sitting in chair, in no acute distress Cardiovascular: RRR, no murmurs, rubs or gallops Pulmonary/Chest: normal work of breathing on room air, lungs clear to auscultation bilaterally Abdominal: soft, non-tender, non-distended Skin: warm and dry Extremities: upper/lower extremity pulses 2+, no lower extremity edema present  [facility] Weights   06/02/22 1202  Weight: 94.3 kg     Intake/Output Summary (Last 24 hours) at 06/02/2022 1451 Last data filed at 06/02/2022 1332 Gross per 24 hour  Intake 180 ml  Output 1100 ml  Net -920 ml   Net IO Since Admission: -920 mL [06/02/22 1451]  Pertinent Labs:    Latest Ref Rng & Units 06/02/2022    4:27 AM 05/31/2022    3:19 PM 12/31/2021   11:54 AM  CBC  WBC 4.0 - 10.5 K/uL 6.2  4.1    Hemoglobin 12.0 - 15.0 g/dL 10.8  11.4  9.0   Hematocrit 36.0 - 46.0 % 36.7  40.5  28.3   Platelets 150 - 400 K/uL 239  235         Latest Ref Rng & Units 06/02/2022    4:27 AM 05/31/2022    3:19 PM 12/29/2021   11:24 PM  CMP  Glucose 70 - 99 mg/dL 94   108  113   BUN 8 - 23 mg/dL '30  16  25   '$ Creatinine 0.44 - 1.00 mg/dL 1.45  1.44  1.03   Sodium 135 - 145 mmol/L 136  134  139   Potassium 3.5 - 5.1 mmol/L 4.2  3.7  3.9   Chloride 98 - 111 mmol/L 103  101  113   CO2 22 - 32 mmol/L '23  25  23   '$ Calcium 8.9 - 10.3 mg/dL 8.4  8.4  7.7       Assessment/Plan:   Principal Problem:   Acute exacerbation of CHF (congestive heart failure) (HCC) Active Problems:   Hypertension   Hypothyroidism   AKI (acute kidney injury) (HCC)   Obesity (BMI 30-39.9)   Pulmonary hypertension (HCC)   Moderate tricuspid regurgitation   Patient Summary: Michaela Baker is a 72 y.o. with a pertinent PMH of HTN, hypothyroidism, and obesity, who presented with generalized weakness/fatigue and admitted for acute on chronic heart failure exacerbation.    #Acute Exacerbation of CHF  #Pulmonary HTN  #Moderate TR Pt presented with a  two week history of fatigue and lower extremity edema. She went to urgent care and was found to be saturating at 82% on room air. She was then sent here for management. Her last Echo was in 2021 which showed a LVEF of 07%, normal diastolic function, and no RWMA but did show moderate tricuspid regurgitation and pulmonary artery systolic pressure of 62UQ. Unsure what led to her heart failure exacerbation at the moment, however pt shows significant improvement in her breathing, and has not required supplemental oxygen. When ambulating, she did desaturate to 87% towards the end, so will continue diuresis.   Plan:  - Continue IV Lasix '40mg'$  BID  - Strict I/O  - Daily Weights  - Continue PT/OT Eval  - F/U Echo - Replete electrolytes as necessary   #AKI Baseline creatinine is .8-1, creatinine remains the same level as upon admission at 1.45. UA obtained showed mild proteinuria, mild glucosuria, and small HgB. She is urinating well, and has no urinary symptoms. Will monitor creatinine tomorrow with BMP.   Plan:  - BMP  daily  #Hypothyroidism Will continue synthroid 12.49mg   Diet: Heart Healthy IVF: None,None VTE: Enoxaparin Code: Full    Dispo: Anticipated discharge to Home in 2 days pending medical management.   SDrucie Opitz MD PGY-1 Internal Medicine Resident Pager Number [phone] contact the on call pager after 5 pm and on weekends at [phone]

## 2022-06-02 NOTE — Progress Notes (Signed)
slight SOB. 100% on 2 L 96% then 87% stood, for a minute back up to 90% then Therapy walked up to further assess needs.

## 2022-06-02 NOTE — Plan of Care (Signed)

## 2022-06-02 NOTE — ED Notes (Signed)
ED TO INPATIENT HANDOFF REPORT  ED Nurse Name and Phone #: Salvatore Decent Name/Age/Gender Michaela Baker 72 y.o. female Room/Bed: 043C/043C  Code Status   Code Status: Full Code  Home/SNF/Other Home Patient oriented to: self, place, time, and situation Is this baseline? Yes   Triage Complete: Triage complete  Chief Complaint Acute exacerbation of CHF (congestive heart failure) ([place]) [I50.9]  Triage Note Patient BIB [facility] from an urgent care for evaluation of shortness of breath and fatigue that started several days ago. SpO2 94% on room air, lowers to 88% with ambulation. History of CHF, patient is alert, oriented, and in no apparent distress at this time.   Allergies No Known Allergies  Level of Care/Admitting Diagnosis ED Disposition     ED Disposition  Admit   Condition  --   Comment  Hospital Area: [place] [[id number]]  Level of Care: Telemetry Medical [104]  May place patient in observation at [facility] or [place] if equivalent level of care is available:: No  Covid Evaluation: Asymptomatic - no recent exposure (last 10 days) testing not required  Diagnosis: Acute exacerbation of CHF (congestive heart failure) [facility]) [[id number]]  Admitting Physician: Liane Comber  Attending Physician: HATCHER, JEFFREY C [[id number]]          B Medical/Surgery History Past Medical History:  Diagnosis Date   CHF (congestive heart failure) ([place])    Hypertension    Hypothyroidism 03/29/2017   Obesity (BMI 30-39.9)    Premature ventricular contractions    Past Surgical History:  Procedure Laterality Date   ABDOMINAL HYSTERECTOMY     CHOLECYSTECTOMY     COLONOSCOPY WITH PROPOFOL N/A 12/29/2021   Procedure: COLONOSCOPY WITH PROPOFOL;  Surgeon: Otis Brace, MD;  Location: [facility];  Service: Gastroenterology;  Laterality: N/A;   ENDOVENOUS ABLATION SAPHENOUS VEIN W/ LASER Right 01/26/2022   endovenous laser ablation right greater saphenous  vein by Gae Gallop MD   TONSILLECTOMY     TUBAL LIGATION       A IV Location/Drains/Wounds Patient Lines/Drains/Airways Status     Active Line/Drains/Airways     Name Placement date Placement time Site Days   Peripheral IV 05/31/22 20 G Anterior;Distal;Right;Upper Arm 05/31/22  1403  Arm  2   AIRWAYS 12/29/21  0956  -- 155   AIRWAYS 12/29/21  0956  -- 155   Wound / Incision (Open or Dehisced) 03/03/21 Diabetic ulcer Foot Right Wound on heel 03/03/21  1615  Foot  456            Intake/Output Last 24 hours  Intake/Output Summary (Last 24 hours) at 06/02/2022 0726 Last data filed at 06/02/2022 0737 Gross per 24 hour  Intake --  Output 1000 ml  Net -1000 ml    Labs/Imaging Results for orders placed or performed during the hospital encounter of 05/31/22 (from the past 48 hour(s))  Resp panel by RT-PCR (RSV, Flu A&B, Covid) Anterior Nasal Swab     Status: None   Collection Time: 05/31/22  3:03 PM   Specimen: Anterior Nasal Swab  Result Value Ref Range   SARS Coronavirus 2 by RT PCR NEGATIVE NEGATIVE    Comment: (NOTE) SARS-CoV-2 target nucleic acids are NOT DETECTED.  The SARS-CoV-2 RNA is generally detectable in upper respiratory specimens during the acute phase of infection. The lowest concentration of SARS-CoV-2 viral copies this assay can detect is 138 copies/mL. A negative result does not preclude SARS-Cov-2 infection and should not be used as  the sole basis for treatment or other patient management decisions. A negative result may occur with  improper specimen collection/handling, submission of specimen other than nasopharyngeal swab, presence of viral mutation(s) within the areas targeted by this assay, and inadequate number of viral copies(<138 copies/mL). A negative result must be combined with clinical observations, patient history, and epidemiological information. The expected result is Negative.  Fact Sheet for Patients:   EntrepreneurPulse.com.au  Fact Sheet for Healthcare Providers:  IncredibleEmployment.be  This test is no t yet approved or cleared by the [place] FDA and  has been authorized for detection and/or diagnosis of SARS-CoV-2 by FDA under an Emergency Use Authorization (EUA). This EUA will remain  in effect (meaning this test can be used) for the duration of the COVID-19 declaration under Section 564(b)(1) of the Act, 21 U.S.C.section 360bbb-3(b)(1), unless the authorization is terminated  or revoked sooner.       Influenza A by PCR NEGATIVE NEGATIVE   Influenza B by PCR NEGATIVE NEGATIVE    Comment: (NOTE) The Xpert Xpress SARS-CoV-2/FLU/RSV plus assay is intended as an aid in the diagnosis of influenza from Nasopharyngeal swab specimens and should not be used as a sole basis for treatment. Nasal washings and aspirates are unacceptable for Xpert Xpress SARS-CoV-2/FLU/RSV testing.  Fact Sheet for Patients: EntrepreneurPulse.com.au  Fact Sheet for Healthcare Providers: IncredibleEmployment.be  This test is not yet approved or cleared by the [place] FDA and has been authorized for detection and/or diagnosis of SARS-CoV-2 by FDA under an Emergency Use Authorization (EUA). This EUA will remain in effect (meaning this test can be used) for the duration of the COVID-19 declaration under Section 564(b)(1) of the Act, 21 U.S.C. section 360bbb-3(b)(1), unless the authorization is terminated or revoked.     Resp Syncytial Virus by PCR NEGATIVE NEGATIVE    Comment: (NOTE) Fact Sheet for Patients: EntrepreneurPulse.com.au  Fact Sheet for Healthcare Providers: IncredibleEmployment.be  This test is not yet approved or cleared by the [place] FDA and has been authorized for detection and/or diagnosis of SARS-CoV-2 by FDA under an Emergency Use Authorization (EUA).  This EUA will remain in effect (meaning this test can be used) for the duration of the COVID-19 declaration under Section 564(b)(1) of the Act, 21 U.S.C. section 360bbb-3(b)(1), unless the authorization is terminated or revoked.  Performed at [facility], [place] [address], [place], [place] [address]   Basic metabolic panel     Status: Abnormal   Collection Time: 05/31/22  3:19 PM  Result Value Ref Range   Sodium 134 (L) 135 - 145 mmol/L   Potassium 3.7 3.5 - 5.1 mmol/L   Chloride 101 98 - 111 mmol/L   CO2 25 22 - 32 mmol/L   Glucose, Bld 108 (H) 70 - 99 mg/dL    Comment: Glucose reference range applies only to samples taken after fasting for at least 8 hours.   BUN 16 8 - 23 mg/dL   Creatinine, Baker 1.44 (H) 0.44 - 1.00 mg/dL   Calcium 8.4 (L) 8.9 - 10.3 mg/dL   GFR, Estimated 39 (L) >60 mL/min    Comment: (NOTE) Calculated using the CKD-EPI Creatinine Equation (2021)    Anion gap 8 5 - 15    Comment: Performed at [facility] [address] [place], [place] [address]  CBC with Differential     Status: Abnormal   Collection Time: 05/31/22  3:19 PM  Result Value Ref Range   WBC 4.1 4.0 - 10.5  K/uL   RBC 5.32 (H) 3.87 - 5.11 MIL/uL   Hemoglobin 11.4 (L) 12.0 - 15.0 g/dL   HCT 40.5 36.0 - 46.0 %   MCV 76.1 (L) 80.0 - 100.0 fL   MCH 21.4 (L) 26.0 - 34.0 pg   MCHC 28.1 (L) 30.0 - 36.0 g/dL   RDW 22.0 (H) 11.5 - 15.5 %   Platelets 235 150 - 400 K/uL   nRBC 0.0 0.0 - 0.2 %   Neutrophils Relative % 66 %   Neutro Abs 2.7 1.7 - 7.7 K/uL   Lymphocytes Relative 23 %   Lymphs Abs 0.9 0.7 - 4.0 K/uL   Monocytes Relative 9 %   Monocytes Absolute 0.4 0.1 - 1.0 K/uL   Eosinophils Relative 2 %   Eosinophils Absolute 0.1 0.0 - 0.5 K/uL   Basophils Relative 0 %   Basophils Absolute 0.0 0.0 - 0.1 K/uL   WBC Morphology WHITE COUNT CONFIRMED ON SMEAR    RBC Morphology MORPHOLOGY UNREMARKABLE    Smear Review PLATELET COUNT CONFIRMED BY SMEAR    Immature Granulocytes 0 %    Abs Immature Granulocytes 0.01 0.00 - 0.07 K/uL    Comment: Performed at [facility], [address] [place], [place] [address]  Brain natriuretic peptide     Status: Abnormal   Collection Time: 05/31/22  3:19 PM  Result Value Ref Range   B Natriuretic Peptide 157.3 (H) 0.0 - 100.0 pg/mL    Comment: Performed at [facility] [address] [place], [place] [address]  Troponin I (High Sensitivity)     Status: None   Collection Time: 05/31/22  3:19 PM  Result Value Ref Range   Troponin I (High Sensitivity) 10 <18 ng/L    Comment: (NOTE) Elevated high sensitivity troponin I (hsTnI) values and significant  changes across serial measurements may suggest ACS but many other  chronic and acute conditions are known to elevate hsTnI results.  Refer to the "Links" section for chest pain algorithms and additional  guidance. Performed at [facility], [place] [address] [place], [place] [address]   Troponin I (High Sensitivity)     Status: None   Collection Time: 06/01/22  3:57 AM  Result Value Ref Range   Troponin I (High Sensitivity) 6 <18 ng/L    Comment: (NOTE) Elevated high sensitivity troponin I (hsTnI) values and significant  changes across serial measurements may suggest ACS but many other  chronic and acute conditions are known to elevate hsTnI results.  Refer to the "Links" section for chest pain algorithms and additional  guidance. Performed at [facility], [place] [address] [place], [place] [address]   Magnesium     Status: None   Collection Time: 06/01/22 11:30 AM  Result Value Ref Range   Magnesium 2.3 1.7 - 2.4 mg/dL    Comment: Performed at [facility] [address], [place], [place] [address]  T4, free     Status: Abnormal   Collection Time: 06/01/22 11:30 AM  Result Value Ref Range   Free T4 1.35 (H) 0.61 - 1.12 ng/dL    Comment: (NOTE) Biotin ingestion may interfere with free T4 tests. If the results are inconsistent with the TSH  level, previous test results, or the clinical presentation, then consider biotin interference. If needed, order repeat testing after stopping biotin. Performed at [facility], [place] [address], [place], [place] [address]   TSH     Status:  Abnormal   Collection Time: 06/01/22  1:27 PM  Result Value Ref Range   TSH <0.010 (L) 0.350 - 4.500 uIU/mL    Comment: Performed by a 3rd Generation assay with a functional sensitivity of <=0.01 uIU/mL. Performed at [facility], [place] [address], [place], [place] [address]   Hemoglobin A1c     Status: None   Collection Time: 06/01/22  1:27 PM  Result Value Ref Range   Hgb A1c MFr Bld 5.6 4.8 - 5.6 %    Comment: (NOTE)         Prediabetes: 5.7 - 6.4         Diabetes: >6.4         Glycemic control for adults with diabetes: <7.0    Mean Plasma Glucose 114 mg/dL    Comment: (NOTE) Performed At: [facility]  [place], [place] [id number] Rush Farmer MD [id number]   Basic metabolic panel     Status: Abnormal   Collection Time: 06/02/22  4:27 AM  Result Value Ref Range   Sodium 136 135 - 145 mmol/L   Potassium 4.2 3.5 - 5.1 mmol/L   Chloride 103 98 - 111 mmol/L   CO2 23 22 - 32 mmol/L   Glucose, Bld 94 70 - 99 mg/dL    Comment: Glucose reference range applies only to samples taken after fasting for at least 8 hours.   BUN 30 (H) 8 - 23 mg/dL   Creatinine, Baker 1.45 (H) 0.44 - 1.00 mg/dL   Calcium 8.4 (L) 8.9 - 10.3 mg/dL   GFR, Estimated 38 (L) >60 mL/min    Comment: (NOTE) Calculated using the CKD-EPI Creatinine Equation (2021)    Anion gap 10 5 - 15    Comment: Performed at [facility] [address] [place], [place] [address]  CBC     Status: Abnormal   Collection Time: 06/02/22  4:27 AM  Result Value Ref Range   WBC 6.2 4.0 - 10.5 K/uL   RBC 4.83 3.87 - 5.11 MIL/uL   Hemoglobin 10.8 (L) 12.0 - 15.0 g/dL   HCT 36.7 36.0 - 46.0 %   MCV 76.0 (L) 80.0 - 100.0 fL   MCH 22.4 (L) 26.0 - 34.0  pg   MCHC 29.4 (L) 30.0 - 36.0 g/dL   RDW 21.6 (H) 11.5 - 15.5 %   Platelets 239 150 - 400 K/uL   nRBC 0.0 0.0 - 0.2 %    Comment: Performed at [facility], [place] [address], [place], [place] [address]   DG Chest 1 View  Result Date: 05/31/2022 CLINICAL DATA:  Shortness of breath. EXAM: CHEST  1 VIEW COMPARISON:  March 31, 2022. FINDINGS: Stable cardiomediastinal silhouette. Stable minimal bibasilar subsegmental atelectasis or scarring is noted. Bony thorax is unremarkable. IMPRESSION: Stable minimal bibasilar subsegmental atelectasis or scarring. Electronically Signed   By: Marijo Conception M.D.   On: 05/31/2022 16:15    Pending Labs Unresulted Labs (From admission, onward)    None       Vitals/Pain Today's Vitals   06/02/22 0500 06/02/22 0643 06/02/22 0645 06/02/22 0723  BP: 120/63  131/66 126/72  Pulse: 60 (!) 59 (!) 56 61  Resp: '19 15 12 16  '$ Temp:    98.1 F (36.7 C)  TempSrc:    Oral  SpO2: 100% 100% 100% 100%  PainSc:    0-No pain    Isolation Precautions No active isolations  Medications Medications  enoxaparin (LOVENOX)  injection 40 mg (40 mg Subcutaneous Given 06/01/22 1309)  acetaminophen (TYLENOL) tablet 650 mg (has no administration in time range)    Or  acetaminophen (TYLENOL) suppository 650 mg (has no administration in time range)  polyethylene glycol (MIRALAX / GLYCOLAX) packet 17 g (has no administration in time range)  amLODipine (NORVASC) tablet 10 mg (10 mg Oral Given 06/01/22 1311)  aspirin EC tablet 81 mg (81 mg Oral Given 06/01/22 1309)  dapagliflozin propanediol (FARXIGA) tablet 10 mg (10 mg Oral Given 06/01/22 1311)  levothyroxine (SYNTHROID) tablet 12.5 mcg (12.5 mcg Oral Given 06/02/22 0602)  rosuvastatin (CRESTOR) tablet 5 mg (5 mg Oral Given 06/01/22 1309)  thiamine (VITAMIN B1) tablet 100 mg (100 mg Oral Given 06/01/22 1310)  furosemide (LASIX) injection 40 mg (40 mg Intravenous Given 06/01/22 1831)  perflutren lipid microspheres (DEFINITY) IV  suspension (2 mLs Intravenous Given 06/01/22 1239)  furosemide (LASIX) injection 40 mg (40 mg Intravenous Given 06/01/22 0953)  potassium chloride SA (KLOR-CON M) CR tablet 40 mEq (40 mEq Oral Given 06/01/22 1308)    Mobility walks Low fall risk      R Recommendations: See Admitting Provider Note  Report given to:   Additional Notes:

## 2022-06-03 ENCOUNTER — Inpatient Hospital Stay (HOSPITAL_COMMUNITY): Payer: Medicare Other

## 2022-06-03 DIAGNOSIS — I071 Rheumatic tricuspid insufficiency: Secondary | ICD-10-CM | POA: Diagnosis not present

## 2022-06-03 DIAGNOSIS — I272 Pulmonary hypertension, unspecified: Secondary | ICD-10-CM | POA: Diagnosis not present

## 2022-06-03 DIAGNOSIS — I5033 Acute on chronic diastolic (congestive) heart failure: Secondary | ICD-10-CM | POA: Diagnosis not present

## 2022-06-03 LAB — BASIC METABOLIC PANEL
Anion gap: 9 (ref 5–15)
BUN: 29 mg/dL — ABNORMAL HIGH (ref 8–23)
CO2: 26 mmol/L (ref 22–32)
Calcium: 8.8 mg/dL — ABNORMAL LOW (ref 8.9–10.3)
Chloride: 101 mmol/L (ref 98–111)
Creatinine, Ser: 1.34 mg/dL — ABNORMAL HIGH (ref 0.44–1.00)
GFR, Estimated: 42 mL/min — ABNORMAL LOW (ref >60)
Glucose, Bld: 91 mg/dL (ref 70–99)
Potassium: 3.7 mmol/L (ref 3.5–5.1)
Sodium: 136 mmol/L (ref 135–145)

## 2022-06-03 LAB — CBC
HCT: 40.7 % (ref 36.0–46.0)
Hemoglobin: 11.8 g/dL — ABNORMAL LOW (ref 12.0–15.0)
MCH: 21.9 pg — ABNORMAL LOW (ref 26.0–34.0)
MCHC: 29 g/dL — ABNORMAL LOW (ref 30.0–36.0)
MCV: 75.5 fL — ABNORMAL LOW (ref 80.0–100.0)
Platelets: 282 10*3/uL (ref 150–400)
RBC: 5.39 MIL/uL — ABNORMAL HIGH (ref 3.87–5.11)
RDW: 21.9 % — ABNORMAL HIGH (ref 11.5–15.5)
WBC: 5.3 10*3/uL (ref 4.0–10.5)
nRBC: 0 % (ref 0.0–0.2)

## 2022-06-03 LAB — GLUCOSE, CAPILLARY: Glucose-Capillary: 88 mg/dL (ref 70–99)

## 2022-06-03 MED ORDER — METOPROLOL SUCCINATE ER 50 MG PO TB24
50.0000 mg | ORAL_TABLET | Freq: Every day | ORAL | Status: DC
  Administered 2022-06-03 – 2022-06-04 (×2): 50 mg via ORAL
  Filled 2022-06-03 (×2): qty 1

## 2022-06-03 MED ORDER — FUROSEMIDE 10 MG/ML IJ SOLN: 40.0000 mg | Freq: Once | INTRAMUSCULAR | Status: DC

## 2022-06-03 MED ORDER — FUROSEMIDE 10 MG/ML IJ SOLN
40.0000 mg | Freq: Once | INTRAMUSCULAR | Status: AC
  Administered 2022-06-03: 40 mg via INTRAVENOUS
  Filled 2022-06-03: qty 4

## 2022-06-03 MED ORDER — POTASSIUM CHLORIDE CRYS ER 20 MEQ PO TBCR
40.0000 meq | EXTENDED_RELEASE_TABLET | Freq: Once | ORAL | Status: AC
  Administered 2022-06-03: 40 meq via ORAL
  Filled 2022-06-03: qty 2

## 2022-06-03 NOTE — Progress Notes (Signed)
Physical Therapy Treatment Patient Details Name: Michaela Baker MRN: [id number] DOB: 1950/09/15 Today's Date: 06/03/2022   History of Present Illness 72 y.o. female presents to [facility] hospital on 05/31/2022 with weakness and fatigue for 2 weeks. Pt found to be hypoxic at urgent care, admitted for CHF exacerbation. PMH includes HTN, CHF, PVCs, hypothyroidism.    PT Comments    Pt making good progress.  She was able to ambulate 300' without AD or assist, but does still require O2.  Nursing reports sats decreased earlier today to low 80's on RA with ambulation so did not retest on RA, sats were stable on 2 L with activity.  Pt has chronic back pain that leads to antalgic gait but steady.  She has been ambulating in room independently.  Pt feels that her gait and transfers are near baseline. Pt goals met or adequate for d/c from PT.  No further skilled acute PT indicated.     Recommendations for follow up therapy are one component of a multi-disciplinary discharge planning process, led by the attending physician.  Recommendations may be updated based on patient status, additional functional criteria and insurance authorization.  Follow Up Recommendations  No PT follow up     Assistance Recommended at Discharge PRN  Patient can return home with the following     Equipment Recommendations  None recommended by PT    Recommendations for Other Services       Precautions / Restrictions Precautions Precautions: Fall Precaution Comments: monitor sats     Mobility  Bed Mobility Overal bed mobility: Modified Independent                  Transfers Overall transfer level: Modified independent     Sit to Stand: Modified independent (Device/Increase time)                Ambulation/Gait Ambulation/Gait assistance: Modified independent (Device/Increase time) Gait Distance (Feet): 300 Feet Assistive device: None Gait Pattern/deviations: Step-through pattern, Wide base of  support Gait velocity: functional     General Gait Details: Pt has been ambulating on her own in room.  She had supervision during therapy for hallway ambulation. Steady gait , does have progressivly increasing trunk flexion - reports chronic back pain/injury   Stairs             Wheelchair Mobility    Modified Rankin (Stroke Patients Only)       Balance Overall balance assessment: Modified Independent   Sitting balance-Leahy Scale: Normal       Standing balance-Leahy Scale: Good                              Cognition Arousal/Alertness: Awake/alert Behavior During Therapy: WFL for tasks assessed/performed Overall Cognitive Status: Within Functional Limits for tasks assessed                                          Exercises      General Comments General comments (skin integrity, edema, etc.): Pt was on RA with sats 97% rest.  RN reports sats dropped to low 80's earlier today without O2 with ambulation so ambulated on 2 L      Pertinent Vitals/Pain      Home Living  Prior Function            PT Goals (current goals can now be found in the care plan section) Acute Rehab PT Goals PT Goal Formulation: All assessment and education complete, DC therapy Additional Goals Additional Goal #1:  (nearly met) Additional Goal #2:  (nearly met) Progress towards PT goals: Goals met/education completed, patient discharged from PT (met or nearly met; adequate for d/c)    Frequency           PT Plan Current plan remains appropriate    Co-evaluation              AM-PAC PT "6 Clicks" Mobility   Outcome Measure  Help needed turning from your back to your side while in a flat bed without using bedrails?: None Help needed moving from lying on your back to sitting on the side of a flat bed without using bedrails?: None Help needed moving to and from a bed to a chair (including a wheelchair)?:  None Help needed standing up from a chair using your arms (e.g., wheelchair or bedside chair)?: None Help needed to walk in hospital room?: None Help needed climbing 3-5 steps with a railing? : None 6 Click Score: 24    End of Session Equipment Utilized During Treatment: Oxygen Activity Tolerance: Patient tolerated treatment well Patient left: in chair;with call bell/phone within reach Nurse Communication: Mobility status PT Visit Diagnosis: Other abnormalities of gait and mobility (R26.89)     Time: 6629-4765 PT Time Calculation (min) (ACUTE ONLY): 16 min  Charges:  $Gait Training: 8-22 mins                     Abran Richard, PT Acute Rehab [facility] [phone]    Karlton Lemon 06/03/2022, 4:05 PM

## 2022-06-03 NOTE — TOC Initial Note (Signed)
Transition of Care [facility]) - Initial/Assessment Note    Patient Details  Name: Michaela Baker MRN: [id number] Date of Birth: 1951/03/03  [facility] CM/SW Contact:    Zenon Mayo, RN Phone Number: 06/03/2022, 4:46 PM  Clinical Narrative:                 From home with grand daughter, she has a cane and a walker at home, grand daughter will transport her home at dc.  She has PCP, Dr. Ouida Sills, she states [facility] [address] delivers medications to her .  She is for possible late dc today.   Expected Discharge Plan: Home/Self Care Barriers to Discharge: No Barriers Identified   Patient Goals and CMS Choice Patient states their goals for this hospitalization and ongoing recovery are:: return home with grand daughter   Choice offered to / list presented to : NA      Expected Discharge Plan and Services In-house Referral: NA Discharge Planning Services: CM Consult Post Acute Care Choice: NA Living arrangements for the past 2 months: Single Family Home                 DME Arranged: N/A         HH Arranged: NA          Prior Living Arrangements/Services Living arrangements for the past 2 months: Single Family Home Lives with:: Adult Children Patient language and need for interpreter reviewed:: Yes Do you feel safe going back to the place where you live?: Yes      Need for Family Participation in Patient Care: Yes (Comment) Care giver support system in place?: Yes (comment) Current home services: DME (cane and walker) Criminal Activity/Legal Involvement Pertinent to Current Situation/Hospitalization: No - Comment as needed  Activities of Daily Living Home Assistive Devices/Equipment: None ADL Screening (condition at time of admission) Patient's cognitive ability adequate to safely complete daily activities?: Yes Is the patient deaf or have difficulty hearing?: No Does the patient have difficulty seeing, even when wearing  glasses/contacts?: Yes Does the patient have difficulty concentrating, remembering, or making decisions?: Yes Patient able to express need for assistance with ADLs?: Yes Does the patient have difficulty dressing or bathing?: No Independently performs ADLs?: Yes (appropriate for developmental age) Does the patient have difficulty walking or climbing stairs?: No Weakness of Legs: Both Weakness of Arms/Hands: None  Permission Sought/Granted                  Emotional Assessment Appearance:: Appears stated age Attitude/Demeanor/Rapport: Engaged Affect (typically observed): Appropriate Orientation: : Oriented to Self, Oriented to Place, Oriented to  Time, Oriented to Situation Alcohol / Substance Use: Not Applicable Psych Involvement: No (comment)  Admission diagnosis:  Acute exacerbation of CHF (congestive heart failure) ([place]) [I50.9] Acute on chronic congestive heart failure, unspecified heart failure type ([place]) [I50.9] Acute hypoxic respiratory failure ([place]) [J96.01] Patient Active Problem List   Diagnosis Date Noted   Acute hypoxic respiratory failure ([place]) 06/02/2022   Pulmonary hypertension ([place]) 06/01/2022   Moderate tricuspid regurgitation 06/01/2022   GI bleed 12/27/2021   Premature ventricular contractions    Sepsis ([place]) 03/04/2021   AKI (acute kidney injury) ([place]) 03/03/2021   Obesity (BMI 30-39.9) 03/03/2021   Hyponatremia 03/03/2021   Abnormal LFTs 03/03/2021   Cellulitis of right lower extremity 03/03/2021   Dyspnea 03/29/2017   Hypothyroidism 03/29/2017   OSA (obstructive sleep apnea) 01/03/2017   Left knee pain 01/03/2017   MGUS (monoclonal gammopathy of  unknown significance) 07/26/2013   CHF (congestive heart failure) ([place]) 07/22/2013   Hypertension 07/22/2013   Acute exacerbation of CHF (congestive heart failure) ([place]) 07/21/2013   PCP:  [name], FNP Pharmacy:   [place], [place] - [address] [place] [address] Phone: [phone] Fax: [phone]     Social Determinants of Health ([place]) Social History: [place]: No Food Insecurity (06/02/2022)  Housing: Low Risk  (06/02/2022)  Transportation Needs: No Transportation Needs (06/02/2022)  Utilities: Not At Risk (06/02/2022)  Tobacco Use: Low Risk  (06/02/2022)   SDOH Interventions:     Readmission Risk Interventions    06/03/2022    4:43 PM  Readmission Risk Prevention Plan  Transportation Screening Complete  PCP or Specialist Appt within 5-7 Days Complete  Home Care Screening Complete  Medication Review (RN CM) Complete

## 2022-06-03 NOTE — Progress Notes (Signed)
   06/03/22 1100  Mobility  Activity Ambulated with assistance in hallway  Level of Assistance Modified independent, requires aide device or extra time  Assistive Device None  Distance Ambulated (ft) 100 ft  Activity Response Tolerated well  Mobility Referral Yes  $Mobility charge 1 Mobility    Mobility Specialist Progress Note  Pre-Mobility: 94% SpO2 During Mobility: 85-92% SpO2 Post-Mobility: 95% SpO2  Pt was in chair and agreeable. X2 standing breaks d/t SOB. Returned to chair w/ all needs met and call bell in reach.   Lucious Groves Mobility Specialist  Please contact via SecureChat or Rehab office at [place] [facility]  Please contact via [place] or [place] office at [phone]

## 2022-06-03 NOTE — TOC Transition Note (Signed)
Transition of Care [facility]) - CM/SW Discharge Note   Patient Details  Name: Michaela Baker MRN: [id number] Date of Birth: 13-Apr-1951  Transition of Care [facility]) CM/SW Contact:  Zenon Mayo, RN Phone Number: 06/03/2022, 4:47 PM   Clinical Narrative:    From home with grand daughter, she has a cane and a walker at home, grand daughter will transport her home at dc.  She has PCP, Dr. Ouida Sills, she states [place] [address] delivers medications to her .  She is for possible late dc today.     Final next level of care: Home/Self Care Barriers to Discharge: No Barriers Identified   Patient Goals and CMS Choice   Choice offered to / list presented to : NA  Discharge Placement                         Discharge Plan and Services Additional resources added to the After Visit Summary for   In-house Referral: NA Discharge Planning Services: CM Consult Post Acute Care Choice: NA          DME Arranged: N/A         HH Arranged: NA          Social Determinants of Health (SDOH) Interventions SDOH Screenings   Food Insecurity: No Food Insecurity (06/02/2022)  Housing: Low Risk  (06/02/2022)  Transportation Needs: No Transportation Needs (06/02/2022)  Utilities: Not At Risk (06/02/2022)  Tobacco Use: Low Risk  (06/02/2022)     Readmission Risk Interventions    06/03/2022    4:43 PM  Readmission Risk Prevention Plan  Transportation Screening Complete  PCP or Specialist Appt within 5-7 Days Complete  Home Care Screening Complete  Medication Review (RN CM) Complete

## 2022-06-03 NOTE — Progress Notes (Addendum)
Subjective:   Summary: Michaela Baker is a 72 y.o. year old female currently admitted on the IMTS HD#1 for acute on chronic heart failure exacerbation.  Overnight Events: NOE   Pt was seen today while she was sitting in her bedside recliner. She states that yesterday during ambulation she got short of breath, however this morning when going to the restroom she did not have any dyspnea.  She continues to feel better. She denies any chest pain, nausea, vomiting, or dyspnea. She feels as if her legs have decreased in size.  Objective:  Vital signs in last 24 hours: Vitals:   06/03/22 0019 06/03/22 0455 06/03/22 0735 06/03/22 1024  BP: 131/69 110/67 122/76 128/78  Pulse: 65 67 77 87  Resp: '20 20 18 20  '$ Temp: 98 F (36.7 C) 98.4 F (36.9 C) 98.2 F (36.8 C) 98.5 F (36.9 C)  TempSrc: Oral Oral Oral Oral  SpO2: 95% 95% 96% 95%  Weight: 93.9 kg     Height:       Supplemental O2: Room Air 98%   Physical Exam:  Constitutional: well-appearing woman, sitting in chair, in no acute distress Cardiovascular: RRR, no murmurs, rubs or gallops Pulmonary/Chest: normal work of breathing on room air, lungs clear to auscultation bilaterally Abdominal: soft, non-tender, non-distended Skin: warm and dry Extremities: upper/lower extremity pulses 2+, no lower extremity edema present  [facility] Weights   06/02/22 1202 06/03/22 0019  Weight: 94.3 kg 93.9 kg     Intake/Output Summary (Last 24 hours) at 06/03/2022 1504 Last data filed at 06/03/2022 1332 Gross per 24 hour  Intake 957 ml  Output 1100 ml  Net -143 ml    Net IO Since Admission: -1,063 mL [06/03/22 1504]  Pertinent Labs:    Latest Ref Rng & Units 06/03/2022   12:52 AM 06/02/2022    4:27 AM 05/31/2022    3:19 PM  CBC  WBC 4.0 - 10.5 K/uL 5.3  6.2  4.1   Hemoglobin 12.0 - 15.0 g/dL 11.8  10.8  11.4   Hematocrit 36.0 - 46.0 % 40.7  36.7  40.5   Platelets 150 - 400 K/uL 282  239  235        Latest Ref Rng & Units  06/03/2022   12:52 AM 06/02/2022    4:27 AM 05/31/2022    3:19 PM  CMP  Glucose 70 - 99 mg/dL 91  94  108   BUN 8 - 23 mg/dL '29  30  16   '$ Creatinine 0.44 - 1.00 mg/dL 1.34  1.45  1.44   Sodium 135 - 145 mmol/L 136  136  134   Potassium 3.5 - 5.1 mmol/L 3.7  4.2  3.7   Chloride 98 - 111 mmol/L 101  103  101   CO2 22 - 32 mmol/L '26  23  25   '$ Calcium 8.9 - 10.3 mg/dL 8.8  8.4  8.4       Assessment/Plan:   Principal Problem:   Acute exacerbation of CHF (congestive heart failure) (HCC) Active Problems:   Hypertension   Hypothyroidism   AKI (acute kidney injury) (HCC)   Obesity (BMI 30-39.9)   Pulmonary hypertension (HCC)   Moderate tricuspid regurgitation   Acute hypoxic respiratory failure [facility])   Patient Summary: Michaela Baker is a 72 y.o. with a pertinent PMH of HTN, hypothyroidism, and obesity, who presented with generalized weakness/fatigue and  admitted for acute on chronic heart failure exacerbation.    #Acute Exacerbation of CHF  #Pulmonary HTN  #Moderate TR Pt presented with a two week history of fatigue and lower extremity edema. She went to urgent care and was found to be saturating at 82% on room air. She was then sent here for management. Her Echo revealed an EF of 60-65% with normal function of the left ventricle, with no regional wall abnormalities. There is mildly elevated pulmonary artery systolic pressure, similar to her previous echo in 2021. When ambulating with mobility specialist today, she desaturated to 85%. With improvement in kidney function, seems like she is finally getting fluid off her. Output has been decent, with -700 yesterday and -500 today. Current wait is 93.9kg, compared to admission weight of 94.3kg. Will continue with diuresis. EKG review shows no signs of heart block, and pulses have been stable.   Plan:  - Continue IV Lasix '40mg'$  BID, consider switching to PO tomorrow.  - Ambulation saturation test tomorrow.  - Strict I/O  - Daily Weights  -  Continue PT/OT Eval  - F/U Echo - Replete electrolytes as necessary - Will obtain repeat chest xray to evaluate continued desaturation - Will restart metoprolol   #AKI Baseline creatinine is .8-1, creatinine remains the same level as upon admission at 1.45. UA obtained showed mild proteinuria, mild glucosuria, and small HgB. She is urinating well, and has no urinary symptoms. Creatinine improved to 1.34. Will monitor creatinine tomorrow with BMP.   Plan:  - BMP daily  #Hypothyroidism Will continue synthroid 12.51mg   Diet: Heart Healthy IVF: None,None VTE: Enoxaparin Code: Full    Dispo: Anticipated discharge to Home in 2 days pending medical management.   SDrucie Opitz MD PGY-1 Internal Medicine Resident Pager Number [phone] contact the on call pager after 5 pm and on weekends at [phone]

## 2022-06-04 LAB — CBC
HCT: 36.1 % (ref 36.0–46.0)
Hemoglobin: 11 g/dL — ABNORMAL LOW (ref 12.0–15.0)
MCH: 22.4 pg — ABNORMAL LOW (ref 26.0–34.0)
MCHC: 30.5 g/dL (ref 30.0–36.0)
MCV: 73.4 fL — ABNORMAL LOW (ref 80.0–100.0)
Platelets: 255 10*3/uL (ref 150–400)
RBC: 4.92 MIL/uL (ref 3.87–5.11)
RDW: 21.4 % — ABNORMAL HIGH (ref 11.5–15.5)
WBC: 4.6 10*3/uL (ref 4.0–10.5)
nRBC: 0 % (ref 0.0–0.2)

## 2022-06-04 LAB — BASIC METABOLIC PANEL
Anion gap: 7 (ref 5–15)
BUN: 32 mg/dL — ABNORMAL HIGH (ref 8–23)
CO2: 25 mmol/L (ref 22–32)
Calcium: 8.4 mg/dL — ABNORMAL LOW (ref 8.9–10.3)
Chloride: 104 mmol/L (ref 98–111)
Creatinine, Ser: 1.32 mg/dL — ABNORMAL HIGH (ref 0.44–1.00)
GFR, Estimated: 43 mL/min — ABNORMAL LOW (ref >60)
Glucose, Bld: 94 mg/dL (ref 70–99)
Potassium: 3.9 mmol/L (ref 3.5–5.1)
Sodium: 136 mmol/L (ref 135–145)

## 2022-06-04 LAB — GLUCOSE, CAPILLARY: Glucose-Capillary: 96 mg/dL (ref 70–99)

## 2022-06-04 MED ORDER — FUROSEMIDE 40 MG PO TABS: 40.0000 mg | ORAL_TABLET | Freq: Two times a day (BID) | ORAL | 0 refills | Status: DC

## 2022-06-04 MED ORDER — FUROSEMIDE 40 MG PO TABS
40.0000 mg | ORAL_TABLET | Freq: Two times a day (BID) | ORAL | Status: DC
  Administered 2022-06-04: 40 mg via ORAL
  Filled 2022-06-04: qty 1

## 2022-06-04 NOTE — Progress Notes (Signed)
Nsg Discharge Note  Admit Date:  05/31/2022 Discharge date: 06/04/2022   Michaela Baker to be D/C'd Home per MD order.  AVS completed. Patient/caregiver able to verbalize understanding.  Discharge Medication: Allergies as of 06/04/2022   No Known Allergies      Medication List     TAKE these medications    acetaminophen 500 MG tablet Commonly known as: TYLENOL Take 1,000 mg by mouth as needed (pain).   amLODipine 10 MG tablet Commonly known as: NORVASC Take 10 mg by mouth daily.   aspirin EC 81 MG tablet Take 1 tablet (81 mg total) by mouth daily.   dapagliflozin propanediol 10 MG Tabs tablet Commonly known as: FARXIGA Take 10 mg by mouth daily.   Fish Oil 1000 MG Caps Take 2,000 mg by mouth daily.   furosemide 40 MG tablet Commonly known as: LASIX Take 1 tablet (40 mg total) by mouth 2 (two) times daily. What changed: when to take this   hydrALAZINE 50 MG tablet Commonly known as: APRESOLINE Take 50 mg by mouth daily.   levothyroxine 25 MCG tablet Commonly known as: SYNTHROID Take 12.5 mcg by mouth daily.   metoprolol succinate 50 MG 24 hr tablet Commonly known as: TOPROL-XL Take 50 mg by mouth daily. Take with or immediately following a meal.   multivitamin with minerals tablet Take 2 tablets by mouth daily.   mupirocin ointment 2 % Commonly known as: BACTROBAN Apply 1 application to affected area topically 2 times daily. What changed: See the new instructions.   potassium chloride SA 20 MEQ tablet Commonly known as: KLOR-CON M Take 20 mEq by mouth daily.   rosuvastatin 5 MG tablet Commonly known as: CRESTOR Take 5 mg by mouth daily.   thiamine 100 MG tablet Commonly known as: VITAMIN B1 Take 100 mg by mouth daily.        Discharge Assessment: Vitals:   06/04/22 0512 06/04/22 0840  BP:  108/62  Pulse:  68  Resp: 20 18  Temp: 97.9 F (36.6 C) 98.1 F (36.7 C)  SpO2: 92% 93%   Skin clean, dry and intact without evidence of skin break  down, no evidence of skin tears noted. IV catheter discontinued intact. Site without signs and symptoms of complications - no redness or edema noted at insertion site, patient denies c/o pain - only slight tenderness at site.  Dressing with slight pressure applied.  D/c Instructions-Education: Discharge instructions given to patient/family with verbalized understanding. D/c education completed with patient/family including follow up instructions, medication list, d/c activities limitations if indicated, with other d/c instructions as indicated by MD - patient able to verbalize understanding, all questions fully answered. Patient instructed to return to ED, call 911, or call MD for any changes in condition.  Patient escorted via [place], and D/C home via private auto.  Atilano Ina, RN 06/04/2022 12:24 PM

## 2022-06-04 NOTE — Progress Notes (Signed)
SATURATION QUALIFICATIONS: (This note is used to comply with regulatory documentation for home oxygen)  Patient Saturations on Room Air at Rest = 95%  Patient Saturations on Room Air while Ambulating = 90%  Patient Saturations on 0 Liters of oxygen while Ambulating = 90%  Please briefly explain why patient needs home oxygen:    Pt does not require oxygen to ambulate.    Alvis Lemmings, RN 06/04/2022 11:23 AM

## 2022-06-04 NOTE — Discharge Instructions (Signed)
It was a pleasure taking care of you in the hospital. You came in with what seems like heart failure exacerbation, and we gave you some medication to help get the fluid off of you, you were able to walk without a big drop in your oxygen!  It is very important that you follow up with your primary care physician within the next week. It is also important instead of taking the lasix once a day, we want you to take it twice a day to ensure something like this does not happen again.

## 2022-06-08 DIAGNOSIS — L97309 Non-pressure chronic ulcer of unspecified ankle with unspecified severity: Secondary | ICD-10-CM | POA: Insufficient documentation

## 2022-09-23 DIAGNOSIS — B370 Candidal stomatitis: Secondary | ICD-10-CM | POA: Insufficient documentation

## 2022-09-23 DIAGNOSIS — R634 Abnormal weight loss: Secondary | ICD-10-CM | POA: Insufficient documentation

## 2022-09-27 ENCOUNTER — Encounter: Payer: Self-pay | Admitting: Podiatry

## 2022-09-27 ENCOUNTER — Ambulatory Visit (INDEPENDENT_AMBULATORY_CARE_PROVIDER_SITE_OTHER): Payer: 59 | Admitting: Podiatry

## 2022-09-27 VITALS — BP 105/59 | HR 75

## 2022-09-27 DIAGNOSIS — I83013 Varicose veins of right lower extremity with ulcer of ankle: Secondary | ICD-10-CM

## 2022-09-27 DIAGNOSIS — L97311 Non-pressure chronic ulcer of right ankle limited to breakdown of skin: Secondary | ICD-10-CM

## 2022-09-27 MED ORDER — MUPIROCIN 2 % EX OINT: 1.0000 | TOPICAL_OINTMENT | Freq: Two times a day (BID) | CUTANEOUS | 0 refills | Status: DC

## 2022-09-27 NOTE — Progress Notes (Signed)
She presents today chief complaint of a mildly painful area on the plantar aspect of her right heel states that it opened up a couple of weeks ago she has been wrapping it and using the Bactroban ointment.  She states that it seems to be looking better and is improving.  Objective: Vital stable she is alert and oriented x 3.  I very superficial ulcerative lesion plantar aspect of the right heel measuring 2.5 cm in length and 1.0 cm in width.  There is a 0.0 in depth.  Granulation tissue is present epithelialization is occurring I see no signs of infection.  Assessment: Superficial ulcerative lesion almost looks like a skin abrasion.  Plan: Instructed her to keep the area dry and clean and continue a very small layer of Bactroban ointment and I will follow-up with her in 2 weeks.  She will keep this covered daily.

## 2022-10-04 DIAGNOSIS — N179 Acute kidney failure, unspecified: Secondary | ICD-10-CM | POA: Diagnosis present

## 2022-10-12 ENCOUNTER — Ambulatory Visit
Admission: EM | Admit: 2022-10-12 | Discharge: 2022-10-12 | Disposition: A | Discharge status: Home / Self Care | Payer: 59 | Attending: Family Medicine | Admitting: Family Medicine

## 2022-10-12 DIAGNOSIS — R031 Nonspecific low blood-pressure reading: Secondary | ICD-10-CM | POA: Diagnosis not present

## 2022-10-12 NOTE — ED Provider Notes (Signed)
[facility]   [id number] 10/12/22 Arrival Time: 1815  ASSESSMENT & PLAN:  1. Blood pressure decreased      Discharge Instructions      Do not take your blood pressure medications for the next couple of days. If you cannot get in to see your primary care provider in 48 hours you may return here to recheck your blood pressure.    Feeling ok. No symptoms of lower blood pressure. Measurement today not too far from her baseline.  Canceled U/A; pt could not provide urine sample.   Follow-up Information     Schedule an appointment as soon as possible for a visit  with Diamantina Providence, FNP.   Specialty: Nurse Practitioner Why: in 48 hours to recheck your blood pressure. Contact information: [address] [place] [address] [phone]                 Reviewed expectations re: course of current medical issues. Questions answered. Outlined signs and symptoms indicating need for more acute intervention. Patient verbalized understanding. After Visit Summary given.   SUBJECTIVE:  Michaela Baker is a 72 y.o. female who presents with concerns regarding blood pressures. Pt c/o feeling weak earlier today. Says daughter checked her BP at home and it was "low" also c/o lack of appetite. Feeling her normal self now. Feels she is taking her medications as directed. Denies symptoms of chest pain, palpations, orthopnea, nocturnal dyspnea, or LE edema.  Social History   Tobacco Use  Smoking Status Never  Smokeless Tobacco Never      OBJECTIVE:  Vitals:   10/12/22 1830 10/12/22 1832  BP: 96/60 97/64  Pulse: 89   Resp: 16   Temp: 98.1 F (36.7 C)   TempSrc: Oral   SpO2: 93%     General appearance: alert; no distress Eyes: PERRLA; EOMI HENT: normocephalic; atraumatic Neck: supple Lungs: clear to auscultation bilaterally Heart: regular rate and rhythm without murmer Abdomen: soft, non-tender; bowel sounds normal Extremities: no  edema; symmetrical with no gross deformities Skin: warm and dry Psychological: alert and cooperative; normal mood and affect  ECG: Orders placed or performed during the hospital encounter of 10/12/22   ED EKG   ED EKG   EKG 12-Lead   EKG 12-Lead    Labs:  Labs Reviewed  POCT URINALYSIS DIP (MANUAL ENTRY)    No Known Allergies  Past Medical History:  Diagnosis Date   CHF (congestive heart failure) (HCC)    Hypertension    Hypothyroidism 03/29/2017   Obesity (BMI 30-39.9)    Premature ventricular contractions    Social History   Socioeconomic History   Marital status: Married    Spouse name: Not on file   Number of children: 2   Years of education: Not on file   Highest education level: Not on file  Occupational History   Not on file  Tobacco Use   Smoking status: Never   Smokeless tobacco: Never  Vaping Use   Vaping Use: Never used  Substance and Sexual Activity   Alcohol use: No   Drug use: No   Sexual activity: Never  Other Topics Concern   Not on file  Social History Narrative   Not on file   Social Determinants of Health   Financial Resource Strain: Not on file  Food Insecurity: No Food Insecurity (06/02/2022)   Hunger Vital Sign    Worried About Running Out of Food in the Last Year: Never true  Ran Out of Food in the Last Year: Never true  Transportation Needs: No Transportation Needs (06/02/2022)   PRAPARE - Administrator, Civil Service (Medical): No    Lack of Transportation (Non-Medical): No  Physical Activity: Not on file  Stress: Not on file  Social Connections: Not on file  Intimate Partner Violence: Not At Risk (06/02/2022)   Humiliation, Afraid, Rape, and Kick questionnaire    Fear of Current or Ex-Partner: No    Emotionally Abused: No    Physically Abused: No    Sexually Abused: No   Family History  Problem Relation Age of Onset   Diabetes Mother    Heart failure Mother    Kidney failure Mother    Kidney failure Sister     Diabetes Sister    Heart attack Sister    Breast cancer Neg Hx    Past Surgical History:  Procedure Laterality Date   ABDOMINAL HYSTERECTOMY     CHOLECYSTECTOMY     COLONOSCOPY WITH PROPOFOL N/A 12/29/2021   Procedure: COLONOSCOPY WITH PROPOFOL;  Surgeon: Kathi Der, MD;  Location: [facility];  Service: Gastroenterology;  Laterality: N/A;   ENDOVENOUS ABLATION SAPHENOUS VEIN W/ LASER Right 01/26/2022   endovenous laser ablation right greater saphenous vein by Cari Caraway MD   TONSILLECTOMY     TUBAL LIGATION         Mardella Layman, MD 10/12/22 1943

## 2022-10-12 NOTE — ED Triage Notes (Signed)
Pt c/o feeling weak. Says daughter checked her BP at home and it was "low" also c/o lack of appetite.   Loss of appetite started ~ 2 weeks ago BP was just checked today.   Pt not best historian of current medications.

## 2022-10-12 NOTE — Discharge Instructions (Addendum)
Do not take your blood pressure medications for the next couple of days. If you cannot get in to see your primary care provider in 48 hours you may return here to recheck your blood pressure.

## 2022-10-13 ENCOUNTER — Ambulatory Visit: Payer: 59 | Admitting: Podiatry

## 2022-10-13 ENCOUNTER — Telehealth: Payer: Self-pay | Admitting: Podiatry

## 2022-10-13 ENCOUNTER — Ambulatory Visit (INDEPENDENT_AMBULATORY_CARE_PROVIDER_SITE_OTHER): Payer: 59 | Admitting: Podiatry

## 2022-10-13 DIAGNOSIS — E1142 Type 2 diabetes mellitus with diabetic polyneuropathy: Secondary | ICD-10-CM

## 2022-10-13 DIAGNOSIS — L97411 Non-pressure chronic ulcer of right heel and midfoot limited to breakdown of skin: Secondary | ICD-10-CM

## 2022-10-13 NOTE — Progress Notes (Signed)
  Subjective:  Patient ID: Michaela Baker, female    DOB: 1951-05-26,  MRN: [id number]  Chief Complaint  Patient presents with   Wound Check    right heel ulcer. Patient stated the area comes and goes. Patient changes dressing daily, applying Bactroban.     72 y.o. female presents for follow-up of right heel ulceration.  Previous seen by Dr. Al Corpus.  Has been using Bactroban on the heel.  Says it seems to be improving.  Denies much drainage.  Attention directed  Past Medical History:  Diagnosis Date   CHF (congestive heart failure) (HCC)    Hypertension    Hypothyroidism 03/29/2017   Obesity (BMI 30-39.9)    Premature ventricular contractions     No Known Allergies  ROS: Negative except as per HPI above  Objective:  General: AAO x3, NAD  Dermatological: The right plantar heel there is a small circular ulceration present on the plantar central heel.  Very healthy with minimal hyperkeratotic tissue and healthy wound base with granular tissue present.  No malodor erythema or edema no evidence of infection.  Wound measures 1.5 x 0.7 x 0.2 cm postdebridement and probes to subcutaneous fat level.   Vascular:  Dorsalis Pedis artery and Posterior Tibial artery pedal pulses are 2/4 bilateral.  Capillary fill time < 3 sec to all digits.   Neruologic: Grossly diminished via light touch and protective sensation absent  Musculoskeletal: No gross boney pedal deformities bilateral. No pain, crepitus, or limitation noted with foot and ankle range of motion bilateral. Muscular strength 5/5 in all groups tested bilateral.  Gait: Unassisted, Nonantalgic.   No images are attached to the encounter.  Radiographs:  Deferred Assessment:   1. Skin ulcer of right heel, limited to breakdown of skin (HCC)   2. Type 2 diabetes mellitus with diabetic polyneuropathy, unspecified whether long term insulin use (HCC)      Plan:  Patient was evaluated and treated and all questions answered.  # Ulcer of  right heel to subcutaneous fat level -Overall the wound is very healthy and appears to be improving with local wound care and debridements -We discussed the etiology and factors that are a part of the wound healing process.  We also discussed the risk of infection both soft tissue and osteomyelitis from open ulceration.  Discussed the risk of limb loss if this happens or worsens. -Debridement as below. -Dressed with Betadine, DSD. -Continue home dressing changes daily with adhesive style dressing and Bactroban -Continue off-loading with surgical shoe. -Last antibiotics: None indicated at this time -Imaging: Deferred  Procedure: Excisional Debridement of Wound Rationale: Removal of non-viable soft tissue from the wound to promote healing.  Anesthesia: none Post-Debridement Wound Measurements: 1.5 cm x 0.7 cm x 0.2 cm  Type of Debridement: Sharp Excisional Tissue Removed: Non-viable soft tissue Depth of Debridement: subcutaneous tissue. Technique: Sharp excisional debridement to bleeding, viable wound base.  Dressing: Dry, sterile, compression dressing. Disposition: Patient tolerated procedure well.     Return in about 3 weeks (around 11/03/2022) for f/u R heel ulcer.          Corinna Gab, DPM [facility]

## 2022-10-13 NOTE — Telephone Encounter (Signed)
Pts daughter called stating they thought pts appt was at 1045am and when they looked at the calendar it was at 945am and they missed appt. She states pt really needs to be seen.   I added pt to Dr Annamary Rummage this afternoon.

## 2022-10-14 ENCOUNTER — Emergency Department (HOSPITAL_BASED_OUTPATIENT_CLINIC_OR_DEPARTMENT_OTHER): Payer: 59

## 2022-10-14 ENCOUNTER — Emergency Department (HOSPITAL_BASED_OUTPATIENT_CLINIC_OR_DEPARTMENT_OTHER)
Admission: EM | Admit: 2022-10-14 | Discharge: 2022-10-14 | Disposition: A | Discharge status: Home / Self Care | Payer: 59 | Attending: Emergency Medicine | Admitting: Emergency Medicine

## 2022-10-14 ENCOUNTER — Encounter (HOSPITAL_BASED_OUTPATIENT_CLINIC_OR_DEPARTMENT_OTHER): Payer: Self-pay

## 2022-10-14 ENCOUNTER — Ambulatory Visit
Admission: EM | Admit: 2022-10-14 | Discharge: 2022-10-14 | Disposition: A | Discharge status: Home / Self Care | Payer: 59

## 2022-10-14 ENCOUNTER — Other Ambulatory Visit: Payer: Self-pay

## 2022-10-14 DIAGNOSIS — S91301A Unspecified open wound, right foot, initial encounter: Secondary | ICD-10-CM | POA: Insufficient documentation

## 2022-10-14 DIAGNOSIS — R630 Anorexia: Secondary | ICD-10-CM | POA: Diagnosis not present

## 2022-10-14 DIAGNOSIS — E860 Dehydration: Secondary | ICD-10-CM | POA: Diagnosis not present

## 2022-10-14 DIAGNOSIS — Z7982 Long term (current) use of aspirin: Secondary | ICD-10-CM | POA: Insufficient documentation

## 2022-10-14 DIAGNOSIS — R4182 Altered mental status, unspecified: Secondary | ICD-10-CM | POA: Diagnosis not present

## 2022-10-14 DIAGNOSIS — X58XXXA Exposure to other specified factors, initial encounter: Secondary | ICD-10-CM | POA: Insufficient documentation

## 2022-10-14 DIAGNOSIS — R531 Weakness: Secondary | ICD-10-CM | POA: Diagnosis not present

## 2022-10-14 DIAGNOSIS — R944 Abnormal results of kidney function studies: Secondary | ICD-10-CM | POA: Diagnosis not present

## 2022-10-14 DIAGNOSIS — N3000 Acute cystitis without hematuria: Secondary | ICD-10-CM

## 2022-10-14 DIAGNOSIS — M79604 Pain in right leg: Secondary | ICD-10-CM

## 2022-10-14 DIAGNOSIS — Z79899 Other long term (current) drug therapy: Secondary | ICD-10-CM | POA: Insufficient documentation

## 2022-10-14 DIAGNOSIS — Z87891 Personal history of nicotine dependence: Secondary | ICD-10-CM | POA: Diagnosis not present

## 2022-10-14 DIAGNOSIS — S99921A Unspecified injury of right foot, initial encounter: Secondary | ICD-10-CM | POA: Diagnosis present

## 2022-10-14 LAB — CBC
HCT: 33.7 % — ABNORMAL LOW (ref 36.0–46.0)
Hemoglobin: 10.3 g/dL — ABNORMAL LOW (ref 12.0–15.0)
MCH: 24.3 pg — ABNORMAL LOW (ref 26.0–34.0)
MCHC: 30.6 g/dL (ref 30.0–36.0)
MCV: 79.7 fL — ABNORMAL LOW (ref 80.0–100.0)
Platelets: 253 10*3/uL (ref 150–400)
RBC: 4.23 MIL/uL (ref 3.87–5.11)
RDW: 18.5 % — ABNORMAL HIGH (ref 11.5–15.5)
WBC: 6.5 10*3/uL (ref 4.0–10.5)
nRBC: 0 % (ref 0.0–0.2)

## 2022-10-14 LAB — URINALYSIS, W/ REFLEX TO CULTURE (INFECTION SUSPECTED)
Glucose, UA: NEGATIVE mg/dL
Ketones, ur: NEGATIVE mg/dL
Nitrite: NEGATIVE
Protein, ur: 100 mg/dL — AB
Specific Gravity, Urine: 1.015 (ref 1.005–1.030)
pH: 5 (ref 5.0–8.0)

## 2022-10-14 LAB — COMPREHENSIVE METABOLIC PANEL
ALT: 11 U/L (ref 0–44)
AST: 24 U/L (ref 15–41)
Albumin: 2.7 g/dL — ABNORMAL LOW (ref 3.5–5.0)
Alkaline Phosphatase: 35 U/L — ABNORMAL LOW (ref 38–126)
Anion gap: 9 (ref 5–15)
BUN: 42 mg/dL — ABNORMAL HIGH (ref 8–23)
CO2: 17 mmol/L — ABNORMAL LOW (ref 22–32)
Calcium: 9 mg/dL (ref 8.9–10.3)
Chloride: 106 mmol/L (ref 98–111)
Creatinine, Ser: 1.73 mg/dL — ABNORMAL HIGH (ref 0.44–1.00)
GFR, Estimated: 31 mL/min — ABNORMAL LOW (ref >60)
Glucose, Bld: 123 mg/dL — ABNORMAL HIGH (ref 70–99)
Potassium: 4.8 mmol/L (ref 3.5–5.1)
Sodium: 132 mmol/L — ABNORMAL LOW (ref 135–145)
Total Bilirubin: 0.8 mg/dL (ref 0.3–1.2)
Total Protein: 8.9 g/dL — ABNORMAL HIGH (ref 6.5–8.1)

## 2022-10-14 MED ORDER — CEPHALEXIN 500 MG PO CAPS: 500.0000 mg | ORAL_CAPSULE | Freq: Four times a day (QID) | ORAL | 0 refills | Status: DC

## 2022-10-14 MED ORDER — SODIUM CHLORIDE 0.9 % IV BOLUS
1000.0000 mL | Freq: Once | INTRAVENOUS | Status: AC
  Administered 2022-10-14: 1000 mL via INTRAVENOUS

## 2022-10-14 MED ORDER — SODIUM CHLORIDE 0.9 % IV SOLN
1.0000 g | Freq: Once | INTRAVENOUS | Status: AC
  Administered 2022-10-14: 1 g via INTRAVENOUS
  Filled 2022-10-14: qty 10

## 2022-10-14 NOTE — ED Notes (Signed)
Pt given ice water to help encourage urination

## 2022-10-14 NOTE — ED Notes (Addendum)
Reports superficial wound to heel of LLE 2cm x 2cm, no drainage or discharge noted.

## 2022-10-14 NOTE — ED Triage Notes (Signed)
The patients daughter stated the patient has not been acting like herself. She got into the shower with her clothes on yesterday. The patient was seen ar urgent care Wednesday and her blood pressure was low. The patient does have a wound on her right foot. The daughter also stated the patient has been very weak and not eating this week.

## 2022-10-14 NOTE — ED Provider Notes (Signed)
[facility] [place] [facility] AT [facility] Provider Note   CSN: [id number] Arrival date & time: 10/14/22  1715     History  Chief Complaint  Patient presents with   Altered Mental Status    Michaela Baker is a 72 y.o. female.   Altered Mental Status Patient brought in with mental status change.  Reported has had some confusion.  Went into the shower with her close on yesterday.  Seen at urgent care with some mild hypotension 2 days ago.  Had stopped her blood pressure medicine.  Has not been eating much.  Reportedly not much of an appetite.  States food does not taste well.  Former smoker.  No nausea or vomiting.  No diarrhea.  No dysuria. Of note patient has a wound to right heel.  Has had infection of this in the past.  Seen by podiatry yesterday ago and was not overly worried of the wound.     Home Medications Prior to Admission medications   Medication Sig Start Date End Date Taking? Authorizing Provider  cephALEXin (KEFLEX) 500 MG capsule Take 1 capsule (500 mg total) by mouth 4 (four) times daily. 10/14/22  Yes Benjiman Core, MD  acetaminophen (TYLENOL) 500 MG tablet Take 1,000 mg by mouth as needed (pain).    [provider]  amLODipine (NORVASC) 10 MG tablet Take 10 mg by mouth daily. 02/04/20   [provider]  aspirin EC 81 MG EC tablet Take 1 tablet (81 mg total) by mouth daily. 07/26/13   Leatha Gilding, MD  dapagliflozin propanediol (FARXIGA) 10 MG TABS tablet Take 10 mg by mouth daily.    [provider]  furosemide (LASIX) 40 MG tablet Take 1 tablet (40 mg total) by mouth 2 (two) times daily. 06/04/22   Nooruddin, Jason Fila, MD  hydrALAZINE (APRESOLINE) 50 MG tablet Take 50 mg by mouth daily.    [provider]  HYDROcodone-acetaminophen (NORCO/VICODIN) 5-325 MG tablet TAKE 1 TABLET EVERY 4 TO 6 HOURS AS NEEDED FOR PAIN.    [provider]  KERENDIA 10 MG TABS take 1 tablet by mouth daily for chronic kidney  disease    [provider]  levothyroxine (SYNTHROID, LEVOTHROID) 25 MCG tablet Take 12.5 mcg by mouth daily. 02/05/15   [provider]  metoprolol succinate (TOPROL-XL) 50 MG 24 hr tablet Take 50 mg by mouth daily. Take with or immediately following a meal.    [provider]  Multiple Vitamins-Minerals (MULTIVITAMIN WITH MINERALS) tablet Take 2 tablets by mouth daily.    [provider]  mupirocin ointment (BACTROBAN) 2 % Apply 1 Application topically 2 (two) times daily. 09/27/22   Hyatt, Max T, DPM  Omega-3 Fatty Acids (FISH OIL) 1000 MG CAPS Take 2,000 mg by mouth daily.    [provider]  potassium chloride SA (KLOR-CON M) 20 MEQ tablet Take 20 mEq by mouth daily.    [provider]  rosuvastatin (CRESTOR) 5 MG tablet Take 5 mg by mouth daily. 05/07/21   [provider]  thiamine 100 MG tablet Take 100 mg by mouth daily.    [provider]      Allergies    Patient has no known allergies.    Review of Systems   Review of Systems  Physical Exam Updated Vital Signs BP 110/60   Pulse 93   Temp 98.2 F (36.8 C) (Oral)   Resp 16   Ht 5\' 5"  (1.651 m)   Wt 82.1  kg   SpO2 95%   BMI 30.12 kg/m  Physical Exam Vitals and nursing note reviewed.  Eyes:     Pupils: Pupils are equal, round, and reactive to light.  Cardiovascular:     Rate and Rhythm: Regular rhythm.  Pulmonary:     Breath sounds: No wheezing or rhonchi.  Abdominal:     Tenderness: There is no abdominal tenderness.  Musculoskeletal:     Cervical back: Neck supple.     Comments: Right heel wound.  Skin:    General: Skin is warm.  Neurological:     Mental Status: She is alert.    Picture from podiatry yesterday. ED Results / Procedures / Treatments   Labs (all labs ordered are listed, but only abnormal results are displayed) Labs Reviewed  COMPREHENSIVE METABOLIC PANEL - Abnormal; Notable for the following components:      Result Value    Sodium 132 (*)    CO2 17 (*)    Glucose, Bld 123 (*)    BUN 42 (*)    Creatinine, Ser 1.73 (*)    Total Protein 8.9 (*)    Albumin 2.7 (*)    Alkaline Phosphatase 35 (*)    GFR, Estimated 31 (*)    All other components within normal limits  CBC - Abnormal; Notable for the following components:   Hemoglobin 10.3 (*)    HCT 33.7 (*)    MCV 79.7 (*)    MCH 24.3 (*)    RDW 18.5 (*)    All other components within normal limits  URINALYSIS, W/ REFLEX TO CULTURE (INFECTION SUSPECTED) - Abnormal; Notable for the following components:   Hgb urine dipstick TRACE (*)    Bilirubin Urine SMALL (*)    Protein, ur 100 (*)    Leukocytes,Ua SMALL (*)    Bacteria, UA FEW (*)    All other components within normal limits  CBG MONITORING, ED    EKG None  Radiology DG Chest Portable 1 View  Result Date: 10/14/2022 CLINICAL DATA:  Low blood pressures and altered mental status EXAM: PORTABLE CHEST 1 VIEW COMPARISON:  Chest radiograph 06/03/2022 FINDINGS: Stable cardiomediastinal silhouette. Aortic atherosclerotic calcification. Bibasilar atelectasis or scarring. Otherwise no focal consolidation, pleural effusion, or pneumothorax. No displaced rib fractures. IMPRESSION: No active disease. Electronically Signed   By: Minerva Fester M.D.   On: 10/14/2022 18:25   CT Head Wo Contrast  Result Date: 10/14/2022 CLINICAL DATA:  Altered mental status EXAM: CT HEAD WITHOUT CONTRAST TECHNIQUE: Contiguous axial images were obtained from the base of the skull through the vertex without intravenous contrast. RADIATION DOSE REDUCTION: This exam was performed according to the departmental dose-optimization program which includes automated exposure control, adjustment of the mA and/or kV according to patient size and/or use of iterative reconstruction technique. COMPARISON:  CT head 03/03/2021 FINDINGS: Brain: No intracranial hemorrhage, mass effect, or evidence of acute infarct. No hydrocephalus. No extra-axial fluid  collection. Generalized cerebral atrophy. Ill-defined hypoattenuation within the cerebral white matter is nonspecific but consistent with chronic small vessel ischemic disease. Vascular: No hyperdense vessel. Intracranial arterial calcification. Skull: No fracture or focal lesion. Sinuses/Orbits: No acute finding. Mild mucosal thickening right ethmoid air cells. Paranasal sinuses and mastoid air cells are otherwise well aerated. Other: None. IMPRESSION: 1. No evidence of acute intracranial abnormality. Electronically Signed   By: Minerva Fester M.D.   On: 10/14/2022 18:13    Procedures Procedures    Medications Ordered in ED Medications  sodium chloride 0.9 %  bolus 1,000 mL (0 mLs Intravenous Stopped 10/14/22 2312)  cefTRIAXone (ROCEPHIN) 1 g in sodium chloride 0.9 % 100 mL IVPB (0 g Intravenous Stopped 10/14/22 2236)    ED Course/ Medical Decision Making/ A&P                             Medical Decision Making Amount and/or Complexity of Data Reviewed Labs: ordered. Radiology: ordered.  Risk Prescription drug management.   Patient with mental status change.  Confusion.  Reportedly had some headaches.  Decreased oral intake.  Former smoker.  Differential diagnosis is long and does include life-threatening conditions such as brain tumors intracranial hemorrhage infection dehydration.  Will get basic blood work urinalysis and head CT.  Also chest x-ray.  Reviewed urgent care note.  Also reviewed podiatry note.  Does show mildly elevated creatinine.  Likely some dehydration.  Feels better after fluid.  Urine showed potential infection.  Will treat.  Appears stable for discharge home.  Urine culture sent.        Final Clinical Impression(s) / ED Diagnoses Final diagnoses:  Altered mental status, unspecified altered mental status type  Dehydration  Acute cystitis without hematuria    Rx / DC Orders ED Discharge Orders          Ordered    cephALEXin (KEFLEX) 500 MG capsule  4  times daily        10/14/22 2255              Benjiman Core, MD 10/14/22 2341

## 2022-10-14 NOTE — Discharge Instructions (Signed)
Please go to the emergency department as soon as you leave urgent care for further evaluation and management. ?

## 2022-10-14 NOTE — ED Notes (Signed)
Patient transported to CT 

## 2022-10-14 NOTE — ED Provider Notes (Signed)
[facility]    CSN: [id number] Arrival date & time: 10/14/22  1553      History   Chief Complaint Chief Complaint  Patient presents with   Altered Mental Status   Weakness   Headache    HPI Michaela Baker is a 72 y.o. female.   Patient presents with her daughter who provides majority of history.  Reports that she has had altered mental status since the beginning of the week which was a few days prior.  She reports that she took a shower with her clothes on a few days prior and has seemed confused as to where certain items are located in her household.  She also had some generalized weakness as well.  Reports that she has had a decreased appetite and is really only drinking water.  Daughter reports that it appears that she has lost weight but she is not exactly sure how much weight she has lost.  Patient is also complaining of some right leg pain that starts at the anterior thigh and radiates down leg.  Daughter reports that she has a foot ulcer on that right heel and she is concerned for sepsis.  Daughter reports that she has had sepsis related to that right heel ulcer in the past and symptoms were similar.  Was seen at podiatry yesterday and was told that everything appeared to be healing well.  Patient and daughter deny fever, chills, body aches.  She was seen in urgent care approximately 3 days ago for concern for low blood pressure.  MD at urgent care advised to her to stop taking blood pressure medications for a few days and follow-up with PCP or urgent care for recheck.  Daughter reports that she has not been taking her blood pressure medications as instructed.   Altered Mental Status Weakness Headache   Past Medical History:  Diagnosis Date   CHF (congestive heart failure) (HCC)    Hypertension    Hypothyroidism 03/29/2017   Obesity (BMI 30-39.9)    Premature ventricular contractions     Patient Active Problem List   Diagnosis Date Noted   Abnormal weight  loss 09/23/2022   Candidiasis of mouth 09/23/2022   Chronic ulcer of ankle (HCC) 06/08/2022   Acute hypoxic respiratory failure (HCC) 06/02/2022   Pulmonary hypertension (HCC) 06/01/2022   Moderate tricuspid regurgitation 06/01/2022   Anemia 05/16/2022   Chronic kidney disease due to type 2 diabetes mellitus (HCC) 05/16/2022   Diverticulosis of colon 01/10/2022   GI bleed 12/27/2021   Premature ventricular contractions    Open wound of lower leg 05/28/2021   Hyperlipidemia 05/07/2021   Sepsis (HCC) 03/04/2021   AKI (acute kidney injury) (HCC) 03/03/2021   Obesity (BMI 30-39.9) 03/03/2021   Hyponatremia 03/03/2021   Abnormal LFTs 03/03/2021   Cellulitis of right lower extremity 03/03/2021   Type 2 diabetes mellitus (HCC) 11/27/2020   Foot pain 08/16/2019   Dyspnea 03/29/2017   Hypothyroidism 03/29/2017   OSA (obstructive sleep apnea) 01/03/2017   Left knee pain 01/03/2017   Benign essential hypertension 10/02/2015   Generalized edema 10/02/2015   Chronic hypokalemia 06/18/2015   MGUS (monoclonal gammopathy of unknown significance) 07/26/2013   CHF (congestive heart failure) (HCC) 07/22/2013   Hypertension 07/22/2013   Acute exacerbation of CHF (congestive heart failure) (HCC) 07/21/2013    Past Surgical History:  Procedure Laterality Date   ABDOMINAL HYSTERECTOMY     CHOLECYSTECTOMY     COLONOSCOPY WITH PROPOFOL N/A 12/29/2021   Procedure: COLONOSCOPY  WITH PROPOFOL;  Surgeon: Kathi Der, MD;  Location: [facility];  Service: Gastroenterology;  Laterality: N/A;   ENDOVENOUS ABLATION SAPHENOUS VEIN W/ LASER Right 01/26/2022   endovenous laser ablation right greater saphenous vein by Cari Caraway MD   TONSILLECTOMY     TUBAL LIGATION      OB History   No obstetric history on file.      Home Medications    Prior to Admission medications   Medication Sig Start Date End Date Taking? Authorizing Provider  acetaminophen (TYLENOL) 500 MG tablet Take 1,000 mg by  mouth as needed (pain).    [provider]  amLODipine (NORVASC) 10 MG tablet Take 10 mg by mouth daily. 02/04/20   [provider]  aspirin EC 81 MG EC tablet Take 1 tablet (81 mg total) by mouth daily. 07/26/13   Leatha Gilding, MD  dapagliflozin propanediol (FARXIGA) 10 MG TABS tablet Take 10 mg by mouth daily.    [provider]  furosemide (LASIX) 40 MG tablet Take 1 tablet (40 mg total) by mouth 2 (two) times daily. 06/04/22   Nooruddin, Jason Fila, MD  hydrALAZINE (APRESOLINE) 50 MG tablet Take 50 mg by mouth daily.    [provider]  HYDROcodone-acetaminophen (NORCO/VICODIN) 5-325 MG tablet TAKE 1 TABLET EVERY 4 TO 6 HOURS AS NEEDED FOR PAIN.    [provider]  KERENDIA 10 MG TABS take 1 tablet by mouth daily for chronic kidney disease    [provider]  levothyroxine (SYNTHROID, LEVOTHROID) 25 MCG tablet Take 12.5 mcg by mouth daily. 02/05/15   [provider]  metoprolol succinate (TOPROL-XL) 50 MG 24 hr tablet Take 50 mg by mouth daily. Take with or immediately following a meal.    [provider]  Multiple Vitamins-Minerals (MULTIVITAMIN WITH MINERALS) tablet Take 2 tablets by mouth daily.    [provider]  mupirocin ointment (BACTROBAN) 2 % Apply 1 Application topically 2 (two) times daily. 09/27/22   Hyatt, Max T, DPM  Omega-3 Fatty Acids (FISH OIL) 1000 MG CAPS Take 2,000 mg by mouth daily.    [provider]  potassium chloride SA (KLOR-CON M) 20 MEQ tablet Take 20 mEq by mouth daily.    [provider]  rosuvastatin (CRESTOR) 5 MG tablet Take 5 mg by mouth daily. 05/07/21   [provider]  thiamine 100 MG tablet Take 100 mg by mouth daily.    [provider]    Family History Family History  Problem Relation Age of Onset   Diabetes Mother    Heart failure Mother    Kidney failure Mother    Kidney failure Sister    Diabetes Sister    Heart attack Sister    Breast  cancer Neg Hx     Social History Social History   Tobacco Use   Smoking status: Never   Smokeless tobacco: Never  Vaping Use   Vaping Use: Never used  Substance Use Topics   Alcohol use: No   Drug use: No     Allergies   Patient has no known allergies.   Review of Systems Review of Systems Per HPI  Physical Exam Triage Vital Signs ED Triage Vitals  Enc Vitals Group     BP 10/14/22 1607 105/70     Pulse Rate 10/14/22 1607 94     Resp 10/14/22 1607 17     Temp 10/14/22 1607 97.8 F (36.6 C)     Temp Source 10/14/22 1607 Oral  SpO2 10/14/22 1607 98 %     Weight --      Height --      Head Circumference --      Peak Flow --      Pain Score 10/14/22 1625 0     Pain Loc --      Pain Edu? --      Excl. in GC? --    No data found.  Updated Vital Signs BP 105/70 (BP Location: Left Arm)   Pulse 94   Temp 97.8 F (36.6 C) (Oral)   Resp 17   SpO2 98%   Visual Acuity Right Eye Distance:   Left Eye Distance:   Bilateral Distance:    Right Eye Near:   Left Eye Near:    Bilateral Near:     Physical Exam Constitutional:      General: She is not in acute distress.    Appearance: Normal appearance. She is not toxic-appearing or diaphoretic.  HENT:     Head: Normocephalic and atraumatic.  Eyes:     Extraocular Movements: Extraocular movements intact.     Conjunctiva/sclera: Conjunctivae normal.     Pupils: Pupils are equal, round, and reactive to light.  Pulmonary:     Effort: Pulmonary effort is normal.  Musculoskeletal:     Comments: Patient reports tenderness to palpation to anterior right thigh.  Skin:    Comments: Patient has erythema and swelling present to the right heel with 2 open lesions with purulent drainage.  Capillary refill and pulses intact.  Patient can wiggle toes.  Neurological:     General: No focal deficit present.     Mental Status: She is alert and oriented to person, place, and time.     Cranial Nerves: Cranial nerves 2-12 are  intact.     Sensory: Sensation is intact.     Motor: Motor function is intact.     Coordination: Coordination is intact.     Gait: Gait is intact.  Psychiatric:        Mood and Affect: Mood normal.        Behavior: Behavior normal.        Thought Content: Thought content normal.        Judgment: Judgment normal.      UC Treatments / Results  Labs (all labs ordered are listed, but only abnormal results are displayed) Labs Reviewed - No data to display  EKG   Radiology No results found.  Procedures Procedures (including critical care time)  Medications Ordered in UC Medications - No data to display  Initial Impression / Assessment and Plan / UC Course  I have reviewed the triage vital signs and the nursing notes.  Pertinent labs & imaging results that were available during my care of the patient were reviewed by me and considered in my medical decision making (see chart for details).     Given new onset altered mental status, generalized weakness, decreased appetite recommended to patient and daughter that she go to the emergency department to receive a full workup with further evaluation and management given limited resources here in urgent care.  Daughter does have a valid concern for complications related to foot ulcer as it appears slightly infected.  Vital signs and neuroexam stable at discharge.  Agree with daughter transporting her to the ER. Final Clinical Impressions(s) / UC Diagnoses   Final diagnoses:  Altered mental status, unspecified altered mental status type  Decreased appetite  Generalized weakness  Right leg  pain     Discharge Instructions      Please go to the emergency department as soon as you leave urgent care for further evaluation and management.    ED Prescriptions   None    PDMP not reviewed this encounter.   Gustavus Bryant, [place] 10/14/22 1630

## 2022-10-14 NOTE — ED Triage Notes (Addendum)
Pt bib daughter, Pt got in the shower yesterday with her clothes on, has weakness, daughter expresses concerns of pt possible being septic. Pt states she had a headache yesterday and has taken tylenol. Pt also c/o localized rt upper leg pain and decreased appetite. Daughter states the last time she was septic she was not eating, sleeping a lot and losing weight.

## 2022-10-14 NOTE — ED Notes (Signed)
Patient is being discharged from the Urgent Care and sent to the Emergency Department via POV . Per Ervin Knack NP, patient is in need of higher level of care due to altered mental status. Patient is aware and verbalizes understanding of plan of care.  Vitals:   10/14/22 1607  BP: 105/70  Pulse: 94  Resp: 17  Temp: 97.8 F (36.6 C)  SpO2: 98%

## 2022-11-03 ENCOUNTER — Ambulatory Visit (INDEPENDENT_AMBULATORY_CARE_PROVIDER_SITE_OTHER): Payer: 59 | Admitting: Podiatry

## 2022-11-03 DIAGNOSIS — L97411 Non-pressure chronic ulcer of right heel and midfoot limited to breakdown of skin: Secondary | ICD-10-CM | POA: Diagnosis not present

## 2022-11-03 DIAGNOSIS — E1142 Type 2 diabetes mellitus with diabetic polyneuropathy: Secondary | ICD-10-CM

## 2022-11-03 NOTE — Progress Notes (Signed)
  Subjective:  Patient ID: Michaela Baker, female    DOB: 1950/10/18,  MRN: [id number]  Chief Complaint  Patient presents with   Foot Ulcer    Rm 16 Follow up right foot ulcer. Pt states improvement. Ulcer is scabbing and healing.     72 y.o. female presents for follow-up of right heel ulceration.  Patient says the ulcer is scabbing up states it is improving denies any pain.  Denies any drainage redness or swelling.  She has not been putting anything on the ulcer at this point in time  Past Medical History:  Diagnosis Date   CHF (congestive heart failure) (HCC)    Hypertension    Hypothyroidism 03/29/2017   Obesity (BMI 30-39.9)    Premature ventricular contractions     No Known Allergies  ROS: Negative except as per HPI above  Objective:  General: AAO x3, NAD  Dermatological: The right plantar heel there is a small oval-shaped eschar present overlying the plantar central heel at site of prior ulceration.  Upon debridement of the overlying eschar there is no underlying ulceration is fully healed at this time   Vascular:  Dorsalis Pedis artery and Posterior Tibial artery pedal pulses are 2/4 bilateral.  Capillary fill time < 3 sec to all digits.   Neruologic: Grossly diminished via light touch and protective sensation absent  Musculoskeletal: No gross boney pedal deformities bilateral. No pain, crepitus, or limitation noted with foot and ankle range of motion bilateral. Muscular strength 5/5 in all groups tested bilateral.  Gait: Unassisted, Nonantalgic.   No images are attached to the encounter.  Radiographs:  Deferred Assessment:   1. Skin ulcer of right heel, limited to breakdown of skin (HCC)   2. Type 2 diabetes mellitus with diabetic polyneuropathy, unspecified whether long term insulin use (HCC)       Plan:  Patient was evaluated and treated and all questions answered.  # Ulcer of right heel to subcutaneous fat level -Overall the ulceration is healed at this  point in time -Continue to monitor for breakdown of the skin and protect the heel by preventing any pressure especially prolonged pressure on the heel -She is at risk for recurrence of ulceration given the very thin atrophic nature of her skin likely secondary to vascular etiology -Patient will follow-up as needed        Corinna Gab, DPM [facility] / [facility]

## 2022-11-07 ENCOUNTER — Other Ambulatory Visit: Payer: Self-pay

## 2022-11-07 ENCOUNTER — Emergency Department (HOSPITAL_COMMUNITY): Payer: 59

## 2022-11-07 ENCOUNTER — Encounter (HOSPITAL_COMMUNITY): Payer: Self-pay

## 2022-11-07 ENCOUNTER — Emergency Department (HOSPITAL_COMMUNITY)
Admission: EM | Admit: 2022-11-07 | Discharge: 2022-11-08 | Disposition: A | Discharge status: Home / Self Care | Payer: 59 | Source: Home / Self Care | Attending: Emergency Medicine | Admitting: Emergency Medicine

## 2022-11-07 DIAGNOSIS — E1122 Type 2 diabetes mellitus with diabetic chronic kidney disease: Secondary | ICD-10-CM | POA: Insufficient documentation

## 2022-11-07 DIAGNOSIS — I509 Heart failure, unspecified: Secondary | ICD-10-CM | POA: Insufficient documentation

## 2022-11-07 DIAGNOSIS — Z79899 Other long term (current) drug therapy: Secondary | ICD-10-CM | POA: Insufficient documentation

## 2022-11-07 DIAGNOSIS — I130 Hypertensive heart and chronic kidney disease with heart failure and stage 1 through stage 4 chronic kidney disease, or unspecified chronic kidney disease: Secondary | ICD-10-CM | POA: Insufficient documentation

## 2022-11-07 DIAGNOSIS — R079 Chest pain, unspecified: Secondary | ICD-10-CM | POA: Insufficient documentation

## 2022-11-07 DIAGNOSIS — D649 Anemia, unspecified: Secondary | ICD-10-CM | POA: Insufficient documentation

## 2022-11-07 DIAGNOSIS — N189 Chronic kidney disease, unspecified: Secondary | ICD-10-CM | POA: Insufficient documentation

## 2022-11-07 DIAGNOSIS — Z7982 Long term (current) use of aspirin: Secondary | ICD-10-CM | POA: Insufficient documentation

## 2022-11-07 DIAGNOSIS — J690 Pneumonitis due to inhalation of food and vomit: Secondary | ICD-10-CM | POA: Diagnosis not present

## 2022-11-07 LAB — BASIC METABOLIC PANEL
Anion gap: 12 (ref 5–15)
BUN: 47 mg/dL — ABNORMAL HIGH (ref 8–23)
CO2: 13 mmol/L — ABNORMAL LOW (ref 22–32)
Calcium: 9.4 mg/dL (ref 8.9–10.3)
Chloride: 106 mmol/L (ref 98–111)
Creatinine, Ser: 2.05 mg/dL — ABNORMAL HIGH (ref 0.44–1.00)
GFR, Estimated: 25 mL/min — ABNORMAL LOW (ref >60)
Glucose, Bld: 82 mg/dL (ref 70–99)
Potassium: 4.9 mmol/L (ref 3.5–5.1)
Sodium: 131 mmol/L — ABNORMAL LOW (ref 135–145)

## 2022-11-07 LAB — CBC
HCT: 31.8 % — ABNORMAL LOW (ref 36.0–46.0)
Hemoglobin: 9.5 g/dL — ABNORMAL LOW (ref 12.0–15.0)
MCH: 24.8 pg — ABNORMAL LOW (ref 26.0–34.0)
MCHC: 29.9 g/dL — ABNORMAL LOW (ref 30.0–36.0)
MCV: 83 fL (ref 80.0–100.0)
Platelets: 252 10*3/uL (ref 150–400)
RBC: 3.83 MIL/uL — ABNORMAL LOW (ref 3.87–5.11)
RDW: 17.8 % — ABNORMAL HIGH (ref 11.5–15.5)
WBC: 9.3 10*3/uL (ref 4.0–10.5)
nRBC: 0 % (ref 0.0–0.2)

## 2022-11-07 LAB — TROPONIN I (HIGH SENSITIVITY): Troponin I (High Sensitivity): 16 ng/L (ref <18)

## 2022-11-07 MED ORDER — MORPHINE SULFATE (PF) 4 MG/ML IV SOLN
4.0000 mg | Freq: Once | INTRAVENOUS | Status: DC
  Filled 2022-11-07: qty 1

## 2022-11-07 MED ORDER — FENTANYL CITRATE PF 50 MCG/ML IJ SOSY
50.0000 ug | PREFILLED_SYRINGE | Freq: Once | INTRAMUSCULAR | Status: AC
  Administered 2022-11-07: 50 ug via INTRAVENOUS
  Filled 2022-11-07: qty 1

## 2022-11-07 NOTE — ED Provider Notes (Signed)
[place] EMERGENCY DEPARTMENT AT [facility] Provider Note   CSN: [id number] Arrival date & time: 11/07/22  1944     History  Chief Complaint  Patient presents with   Chest Pain    Patient to ED via EMS with complaint of chest pain x 5 hours. Patient reports taking 324 asa at 1900. EMS reports giving patient of NS    Michaela Baker is a 72 y.o. female with past medical history significant for CHF, hypertension, obesity, CKD, diabetes who presents concern for right-sided chest pain for the last 5 hours.  She took 324 mg aspirin at 7 PM.  EMS gave patient 100 mL normal saline, no administration of nitroglycerin.  Patient reports the chest pain is worsening, she denies any ripping, tearing pain, back pain, abdominal pain.  She denies any nausea, vomiting, radiation to the left side of the chest or left arm.  She denies any shortness of breath at this time.  She reports that she does take a fluid pill, has not missed any doses.  HPI     Home Medications Prior to Admission medications   Medication Sig Start Date End Date Taking? Authorizing Provider  acetaminophen (TYLENOL) 500 MG tablet Take 1,000 mg by mouth as needed (pain).    [provider]  amLODipine (NORVASC) 10 MG tablet Take 10 mg by mouth daily. 02/04/20   [provider]  aspirin EC 81 MG EC tablet Take 1 tablet (81 mg total) by mouth daily. 07/26/13   Leatha Gilding, MD  cephALEXin (KEFLEX) 500 MG capsule Take 1 capsule (500 mg total) by mouth 4 (four) times daily. 10/14/22   Benjiman Core, MD  dapagliflozin propanediol (FARXIGA) 10 MG TABS tablet Take 10 mg by mouth daily.    [provider]  furosemide (LASIX) 40 MG tablet Take 1 tablet (40 mg total) by mouth 2 (two) times daily. 06/04/22   Nooruddin, Jason Fila, MD  hydrALAZINE (APRESOLINE) 50 MG tablet Take 50 mg by mouth daily.    [provider]  HYDROcodone-acetaminophen (NORCO/VICODIN) 5-325 MG tablet TAKE 1 TABLET EVERY  4 TO 6 HOURS AS NEEDED FOR PAIN.    [provider]  KERENDIA 10 MG TABS take 1 tablet by mouth daily for chronic kidney disease    [provider]  levothyroxine (SYNTHROID, LEVOTHROID) 25 MCG tablet Take 12.5 mcg by mouth daily. 02/05/15   [provider]  metoprolol succinate (TOPROL-XL) 50 MG 24 hr tablet Take 50 mg by mouth daily. Take with or immediately following a meal.    [provider]  Multiple Vitamins-Minerals (MULTIVITAMIN WITH MINERALS) tablet Take 2 tablets by mouth daily.    [provider]  mupirocin ointment (BACTROBAN) 2 % Apply 1 Application topically 2 (two) times daily. 09/27/22   Hyatt, Max T, DPM  Omega-3 Fatty Acids (FISH OIL) 1000 MG CAPS Take 2,000 mg by mouth daily.    [provider]  potassium chloride SA (KLOR-CON M) 20 MEQ tablet Take 20 mEq by mouth daily.    [provider]  rosuvastatin (CRESTOR) 5 MG tablet Take 5 mg by mouth daily. 05/07/21   [provider]  thiamine 100 MG tablet Take 100 mg by mouth daily.    [provider]      Allergies    Patient has no known allergies.    Review of Systems   Review of Systems  Cardiovascular:  Positive for chest pain.  All other systems reviewed and  are negative.   Physical Exam Updated Vital Signs BP 110/75   Pulse 86   Temp 98 F (36.7 C)   Resp 19   Ht 5\' 5"  (1.651 m)   Wt 82 kg   SpO2 93%   BMI 30.08 kg/m  Physical Exam Vitals and nursing note reviewed.  Constitutional:      General: She is not in acute distress.    Appearance: Normal appearance.  HENT:     Head: Normocephalic and atraumatic.  Eyes:     General:        Right eye: No discharge.        Left eye: No discharge.  Cardiovascular:     Rate and Rhythm: Normal rate and regular rhythm.     Heart sounds: No murmur heard.    No friction rub. No gallop.  Pulmonary:     Effort: Pulmonary effort is normal. Tachypnea present.     Breath sounds: Normal  breath sounds.     Comments: No wheezing, rhonchi, respiratory distress Chest:     Comments: No TTP of chest wall Abdominal:     General: Bowel sounds are normal.     Palpations: Abdomen is soft.  Skin:    General: Skin is warm and dry.     Capillary Refill: Capillary refill takes less than 2 seconds.  Neurological:     Mental Status: She is alert and oriented to person, place, and time.  Psychiatric:        Mood and Affect: Mood normal.        Behavior: Behavior normal.     ED Results / Procedures / Treatments   Labs (all labs ordered are listed, but only abnormal results are displayed) Labs Reviewed  BASIC METABOLIC PANEL - Abnormal; Notable for the following components:      Result Value   Sodium 131 (*)    CO2 13 (*)    BUN 47 (*)    Creatinine, Ser 2.05 (*)    GFR, Estimated 25 (*)    All other components within normal limits  CBC - Abnormal; Notable for the following components:   RBC 3.83 (*)    Hemoglobin 9.5 (*)    HCT 31.8 (*)    MCH 24.8 (*)    MCHC 29.9 (*)    RDW 17.8 (*)    All other components within normal limits  BRAIN NATRIURETIC PEPTIDE  TROPONIN I (HIGH SENSITIVITY)  TROPONIN I (HIGH SENSITIVITY)    EKG EKG Interpretation  Date/Time:  Monday November 07 2022 20:17:02 EDT Ventricular Rate:  82 PR Interval:  193 QRS Duration: 92 QT Interval:  366 QTC Calculation: 428 R Axis:   60 Text Interpretation: Sinus rhythm Abnormal R-wave progression, early transition Confirmed by Tilden Fossa [id number]) on 11/08/2022 1:37:07 AM  Radiology DG Chest 2 View  Result Date: 11/07/2022 CLINICAL DATA:  Chest pain starting today. History of heart disease. EXAM: CHEST - 2 VIEW COMPARISON:  10/14/2022 FINDINGS: Shallow inspiration with atelectasis in the lung bases. Heart size and pulmonary vascularity are normal for technique. Calcified and tortuous aorta. No pleural effusions. No pneumothorax. Degenerative changes in the spine and shoulders. Surgical clips in the  right upper quadrant. IMPRESSION: Shallow inspiration with atelectasis in the lung bases. Electronically Signed   By: Burman Nieves M.D.   On: 11/07/2022 20:32    Procedures Procedures    Medications Ordered in ED Medications  fentaNYL (SUBLIMAZE) injection 50 mcg (50 mcg Intravenous Given 11/07/22 2128)  lactated ringers bolus 500 mL (0 mLs Intravenous Stopped 11/08/22 0225)    ED Course/ Medical Decision Making/ A&P                             Medical Decision Making Amount and/or Complexity of Data Reviewed Labs: ordered. Radiology: ordered.  Risk Prescription drug management.   This patient is a 72 y.o. female  who presents to the ED for concern of chest pain.   Differential diagnoses prior to evaluation: The emergent differential diagnosis includes, but is not limited to,  ACS, AAS, PE, Mallory-Weiss, Boerhaave's, Pneumonia, acute bronchitis, asthma or COPD exacerbation, anxiety, MSK pain or traumatic injury to the chest, acid reflux versus other . This is not an exhaustive differential.   Past Medical History / Co-morbidities / Social History: CHF, hypertension, hypothyroidism, obesity, PVCs, diabetes  Additional history: Chart reviewed. Pertinent results include: Reviewed lab work, imaging from previous emergency department visits, as well as echo from January of this year, 60-65% left ventricular ejection fraction at that time  Physical Exam: Physical exam performed. The pertinent findings include: With an overall well-appearing patient in no acute distress, no significant hypertension, afebrile, with normal heart rate and rhythm on arrival.  Stable oxygen saturation on room air, no acute respiratory distress, no accessory breath sounds.  No significant tenderness to palpation of the chest wall  Lab Tests/Imaging studies: I personally interpreted labs/imaging and the pertinent results include: CBC notable for anemia, hemoglobin 9.5, no significant change from  baseline.  Patient with significantly decreased bicarb, CO2 13, may be secondary to some hyperventilation, no anion gap.  She has mild hyponatremia, sodium 131, BUN 47, creatinine 2.05, fairly stable if slightly worsened from recent baseline, patient with no signs of fluid overload, may be slightly dehydrated from her diuretic.  Independently interpreted plain film chest x-ray which shows no evidence of acute or thoracic abnormality.  I agree with the radiologist interpretation.  Cardiac monitoring: EKG obtained and interpreted by my attending physician which shows: Normal sinus rhythm   9:07 AM Care of Michaela Baker transferred to [facility] and Dr. Madilyn Hook at the end of my shift as the patient will require reassessment once labs/imaging have resulted. Patient presentation, ED course, and plan of care discussed with review of all pertinent labs and imaging. Please see his/her note for further details regarding further ED course and disposition. Plan at time of handoff is patient pending delta troponin, BNP, and reevaluation of her symptoms after pain control.  She had significant improvement on my initial assessment after fentanyl x 1, likely to be stable for discharge with close cardiology follow-up as long as her delta troponin is without elevation. This may be altered or completely changed at the discretion of the oncoming team pending results of further workup.  Final Clinical Impression(s) / ED Diagnoses Final diagnoses:  Nonspecific chest pain    Rx / DC Orders ED Discharge Orders          Ordered    Ambulatory referral to Cardiology        11/08/22 0331              [place] 11/09/22 0981    Gwyneth Sprout, MD 11/10/22 1520

## 2022-11-08 LAB — TROPONIN I (HIGH SENSITIVITY): Troponin I (High Sensitivity): 15 ng/L (ref <18)

## 2022-11-08 LAB — BRAIN NATRIURETIC PEPTIDE: B Natriuretic Peptide: 72.6 pg/mL (ref 0.0–100.0)

## 2022-11-08 MED ORDER — LACTATED RINGERS IV BOLUS
500.0000 mL | Freq: Once | INTRAVENOUS | Status: AC
  Administered 2022-11-08: 500 mL via INTRAVENOUS

## 2022-11-08 NOTE — Discharge Instructions (Addendum)
You were seen in the ER today for evaluation of your chest pain.  Your labs showed that your bicarb was slightly low and your kidney function was slightly increased which is why you received some IV fluids.  Please make sure you follow-up with your primary care doctor for recheck of your labs in the next few days to see that these numbers have improved.  Additionally, I see that you have seen a cardiologist for, recommended she still follow-up with a cardiologist.  If you are not able to, I have included an amatory referral to cardiology for you.  Please make sure you call them to schedule appointment if you not heard from them within the next few days.  If you have any worsening chest pain, shortness of breath, leg swelling, fever, cough, please return to the nearest emergency department for evaluation.  If you have any concerns, new or worsening symptoms, please return to the nearest emergency room for evaluation.  Contact a doctor if: Your chest pain does not go away. You feel depressed. You have a fever. Get help right away if: Your chest pain is worse. You have a cough that gets worse, or you cough up blood. You have very bad (severe) pain in your belly (abdomen). You pass out (faint). You have either of these for no clear reason: Sudden chest discomfort. Sudden discomfort in your arms, back, neck, or jaw. You have shortness of breath at any time. You suddenly start to sweat, or your skin gets clammy. You feel sick to your stomach (nauseous). You throw up (vomit). You suddenly feel lightheaded or dizzy. You feel very weak or tired. Your heart starts to beat fast, or it feels like it is skipping beats. These symptoms may be an emergency. Do not wait to see if the symptoms will go away. Get medical help right away. Call your local emergency services (911 in the [place] Do not drive yourself to the hospital.

## 2022-11-08 NOTE — ED Provider Notes (Signed)
Physical Exam  BP 113/83   Pulse 82   Temp 97.9 F (36.6 C)   Resp (!) 24   Ht 5\' 5"  (1.651 m)   Wt 82 kg   SpO2 90%   BMI 30.08 kg/m   Physical Exam  Procedures  Procedures  ED Course / MDM    Medical Decision Making Amount and/or Complexity of Data Reviewed Labs: ordered. Radiology: ordered.  Risk Prescription drug management.   Accepted handoff at shift change from [facility], [place]. Please see prior provider note for more detail.   Briefly: Patient is 73 y.o. F presenting for evaluation of chest pain  DDX: ACS, CHF, nonspecific chest pain  Plan: follow up on troponin and BNP.  Attending physician from previous shift is already evaluated and agrees with plan to discharge home if troponin and BNP are within normal limits.  I independently reviewed and interpreted the patient's labs.  BMP shows decreased sodium at 131.  Patient does have a decrease in bicarb at 13 however does have a normal anion gap.  Creatinine at 2.05 with a BUN of 47. Creatinine was 1.73 three weeks ago and is now 2.05.  Patient's daughter is at bedside and reports that they recently had an appointment with the PCP and mention something about her increased kidney function but cannot member what it is for.  The patient reports that she is on a medication to protect her kidneys.  She was recently started on a medication that "starts with an M" to help with her appetite.  I am unsure why the patient is having a nonanion gap metabolic acidosis.  Likely from her increasing renal failure.  She does have close PCP follow-up.  They would like to be discharged home.  We have given her some IV fluids to help with rehydration and help improve her kidney function.  Her troponins are flat and her BNP is within normal limits.  She does not appear to be fluid overload.  She reports that her chest pain is subsided after the 50 of fentanyl and she is feeling much better.  She would like to be discharged home.  I  recommended following up with her primary care doctor for reevaluation and recheck of her labs.  Unable to see her labs that she recently had and the patient and family members are unable to access her labs on a patient portal.  I recommended following up to reevaluate her labs to make sure that they are improving.  Recommended staying well-hydrated drinking fluids.  Recommended following with her cardiologist.  We discussed lab and imaging results at bedside.  We discussed strict return precautions of red flag symptoms.  Patient and family member verbalized understanding and agreed with the plan.  Patient is stable and being discharged home in good condition.  I discussed this case with my attending physician who cosigned this note including patient's presenting symptoms, physical exam, and planned diagnostics and interventions. Attending physician stated agreement with plan or made changes to plan which were implemented.   DG Chest 2 View  Result Date: 11/07/2022 CLINICAL DATA:  Chest pain starting today. History of heart disease. EXAM: CHEST - 2 VIEW COMPARISON:  10/14/2022 FINDINGS: Shallow inspiration with atelectasis in the lung bases. Heart size and pulmonary vascularity are normal for technique. Calcified and tortuous aorta. No pleural effusions. No pneumothorax. Degenerative changes in the spine and shoulders. Surgical clips in the right upper quadrant. IMPRESSION: Shallow inspiration with atelectasis in the lung bases. Electronically Signed  By: Burman Nieves M.D.   On: 11/07/2022 20:32   Achille Rich, PA-C 11/08/22 0330    Tilden Fossa, MD 11/08/22 [id number]

## 2022-11-09 ENCOUNTER — Encounter (HOSPITAL_COMMUNITY): Payer: Self-pay

## 2022-11-09 ENCOUNTER — Observation Stay (HOSPITAL_COMMUNITY): Payer: 59

## 2022-11-09 ENCOUNTER — Emergency Department (HOSPITAL_COMMUNITY): Payer: 59

## 2022-11-09 ENCOUNTER — Other Ambulatory Visit: Payer: Self-pay

## 2022-11-09 ENCOUNTER — Inpatient Hospital Stay (HOSPITAL_COMMUNITY)
Admission: EM | Admit: 2022-11-09 | Discharge: 2022-11-13 | DRG: 177 | Disposition: A | Discharge status: Home Health | Payer: 59 | Attending: Internal Medicine | Admitting: Internal Medicine

## 2022-11-09 DIAGNOSIS — E785 Hyperlipidemia, unspecified: Secondary | ICD-10-CM | POA: Diagnosis present

## 2022-11-09 DIAGNOSIS — E872 Acidosis, unspecified: Secondary | ICD-10-CM

## 2022-11-09 DIAGNOSIS — J189 Pneumonia, unspecified organism: Principal | ICD-10-CM

## 2022-11-09 DIAGNOSIS — N1832 Chronic kidney disease, stage 3b: Secondary | ICD-10-CM | POA: Diagnosis present

## 2022-11-09 DIAGNOSIS — E43 Unspecified severe protein-calorie malnutrition: Secondary | ICD-10-CM | POA: Diagnosis present

## 2022-11-09 DIAGNOSIS — R634 Abnormal weight loss: Secondary | ICD-10-CM | POA: Diagnosis present

## 2022-11-09 DIAGNOSIS — J432 Centrilobular emphysema: Secondary | ICD-10-CM | POA: Diagnosis present

## 2022-11-09 DIAGNOSIS — R627 Adult failure to thrive: Secondary | ICD-10-CM | POA: Diagnosis present

## 2022-11-09 DIAGNOSIS — T50995A Adverse effect of other drugs, medicaments and biological substances, initial encounter: Secondary | ICD-10-CM | POA: Diagnosis present

## 2022-11-09 DIAGNOSIS — E1142 Type 2 diabetes mellitus with diabetic polyneuropathy: Secondary | ICD-10-CM | POA: Diagnosis not present

## 2022-11-09 DIAGNOSIS — Z8249 Family history of ischemic heart disease and other diseases of the circulatory system: Secondary | ICD-10-CM

## 2022-11-09 DIAGNOSIS — E861 Hypovolemia: Secondary | ICD-10-CM | POA: Diagnosis present

## 2022-11-09 DIAGNOSIS — Z841 Family history of disorders of kidney and ureter: Secondary | ICD-10-CM

## 2022-11-09 DIAGNOSIS — R531 Weakness: Secondary | ICD-10-CM | POA: Diagnosis present

## 2022-11-09 DIAGNOSIS — J690 Pneumonitis due to inhalation of food and vomit: Principal | ICD-10-CM | POA: Diagnosis present

## 2022-11-09 DIAGNOSIS — G9341 Metabolic encephalopathy: Secondary | ICD-10-CM | POA: Diagnosis present

## 2022-11-09 DIAGNOSIS — E876 Hypokalemia: Secondary | ICD-10-CM | POA: Diagnosis not present

## 2022-11-09 DIAGNOSIS — Z7984 Long term (current) use of oral hypoglycemic drugs: Secondary | ICD-10-CM

## 2022-11-09 DIAGNOSIS — N179 Acute kidney failure, unspecified: Secondary | ICD-10-CM | POA: Diagnosis present

## 2022-11-09 DIAGNOSIS — E039 Hypothyroidism, unspecified: Secondary | ICD-10-CM | POA: Diagnosis present

## 2022-11-09 DIAGNOSIS — Z7982 Long term (current) use of aspirin: Secondary | ICD-10-CM

## 2022-11-09 DIAGNOSIS — K920 Hematemesis: Secondary | ICD-10-CM | POA: Diagnosis present

## 2022-11-09 DIAGNOSIS — K140 Glossitis: Secondary | ICD-10-CM | POA: Diagnosis present

## 2022-11-09 DIAGNOSIS — D631 Anemia in chronic kidney disease: Secondary | ICD-10-CM | POA: Diagnosis present

## 2022-11-09 DIAGNOSIS — E875 Hyperkalemia: Secondary | ICD-10-CM | POA: Diagnosis not present

## 2022-11-09 DIAGNOSIS — R4182 Altered mental status, unspecified: Secondary | ICD-10-CM

## 2022-11-09 DIAGNOSIS — R220 Localized swelling, mass and lump, head: Secondary | ICD-10-CM | POA: Diagnosis not present

## 2022-11-09 DIAGNOSIS — Z9049 Acquired absence of other specified parts of digestive tract: Secondary | ICD-10-CM

## 2022-11-09 DIAGNOSIS — I509 Heart failure, unspecified: Secondary | ICD-10-CM | POA: Diagnosis not present

## 2022-11-09 DIAGNOSIS — Z6830 Body mass index (BMI) 30.0-30.9, adult: Secondary | ICD-10-CM

## 2022-11-09 DIAGNOSIS — I130 Hypertensive heart and chronic kidney disease with heart failure and stage 1 through stage 4 chronic kidney disease, or unspecified chronic kidney disease: Secondary | ICD-10-CM | POA: Diagnosis present

## 2022-11-09 DIAGNOSIS — I10 Essential (primary) hypertension: Secondary | ICD-10-CM | POA: Diagnosis present

## 2022-11-09 DIAGNOSIS — E1122 Type 2 diabetes mellitus with diabetic chronic kidney disease: Secondary | ICD-10-CM | POA: Diagnosis present

## 2022-11-09 DIAGNOSIS — E871 Hypo-osmolality and hyponatremia: Secondary | ICD-10-CM | POA: Diagnosis present

## 2022-11-09 DIAGNOSIS — R6881 Early satiety: Secondary | ICD-10-CM | POA: Diagnosis present

## 2022-11-09 DIAGNOSIS — E8721 Acute metabolic acidosis: Secondary | ICD-10-CM | POA: Diagnosis present

## 2022-11-09 DIAGNOSIS — J984 Other disorders of lung: Secondary | ICD-10-CM | POA: Diagnosis not present

## 2022-11-09 DIAGNOSIS — R079 Chest pain, unspecified: Secondary | ICD-10-CM | POA: Diagnosis present

## 2022-11-09 DIAGNOSIS — E669 Obesity, unspecified: Secondary | ICD-10-CM | POA: Diagnosis present

## 2022-11-09 DIAGNOSIS — R1310 Dysphagia, unspecified: Secondary | ICD-10-CM | POA: Diagnosis present

## 2022-11-09 DIAGNOSIS — Z79899 Other long term (current) drug therapy: Secondary | ICD-10-CM

## 2022-11-09 DIAGNOSIS — N189 Chronic kidney disease, unspecified: Secondary | ICD-10-CM | POA: Diagnosis not present

## 2022-11-09 DIAGNOSIS — R410 Disorientation, unspecified: Secondary | ICD-10-CM

## 2022-11-09 DIAGNOSIS — G4733 Obstructive sleep apnea (adult) (pediatric): Secondary | ICD-10-CM | POA: Diagnosis present

## 2022-11-09 DIAGNOSIS — D509 Iron deficiency anemia, unspecified: Secondary | ICD-10-CM | POA: Diagnosis present

## 2022-11-09 DIAGNOSIS — E119 Type 2 diabetes mellitus without complications: Secondary | ICD-10-CM

## 2022-11-09 DIAGNOSIS — Z8711 Personal history of peptic ulcer disease: Secondary | ICD-10-CM

## 2022-11-09 DIAGNOSIS — Z79890 Hormone replacement therapy (postmenopausal): Secondary | ICD-10-CM

## 2022-11-09 DIAGNOSIS — Z90710 Acquired absence of both cervix and uterus: Secondary | ICD-10-CM

## 2022-11-09 DIAGNOSIS — I5032 Chronic diastolic (congestive) heart failure: Secondary | ICD-10-CM | POA: Diagnosis present

## 2022-11-09 DIAGNOSIS — K224 Dyskinesia of esophagus: Secondary | ICD-10-CM | POA: Diagnosis present

## 2022-11-09 DIAGNOSIS — Z833 Family history of diabetes mellitus: Secondary | ICD-10-CM

## 2022-11-09 LAB — COMPREHENSIVE METABOLIC PANEL
ALT: 10 U/L (ref 0–44)
AST: 20 U/L (ref 15–41)
Albumin: 2.5 g/dL — ABNORMAL LOW (ref 3.5–5.0)
Alkaline Phosphatase: 37 U/L — ABNORMAL LOW (ref 38–126)
Anion gap: 12 (ref 5–15)
BUN: 47 mg/dL — ABNORMAL HIGH (ref 8–23)
CO2: 14 mmol/L — ABNORMAL LOW (ref 22–32)
Calcium: 9.9 mg/dL (ref 8.9–10.3)
Chloride: 108 mmol/L (ref 98–111)
Creatinine, Ser: 1.69 mg/dL — ABNORMAL HIGH (ref 0.44–1.00)
GFR, Estimated: 32 mL/min — ABNORMAL LOW (ref >60)
Glucose, Bld: 112 mg/dL — ABNORMAL HIGH (ref 70–99)
Potassium: 5 mmol/L (ref 3.5–5.1)
Sodium: 134 mmol/L — ABNORMAL LOW (ref 135–145)
Total Bilirubin: 0.6 mg/dL (ref 0.3–1.2)
Total Protein: 8.9 g/dL — ABNORMAL HIGH (ref 6.5–8.1)

## 2022-11-09 LAB — CBC WITH DIFFERENTIAL/PLATELET
Abs Immature Granulocytes: 0.08 10*3/uL — ABNORMAL HIGH (ref 0.00–0.07)
Basophils Absolute: 0 10*3/uL (ref 0.0–0.1)
Basophils Relative: 0 %
Eosinophils Absolute: 0 10*3/uL (ref 0.0–0.5)
Eosinophils Relative: 0 %
HCT: 34.5 % — ABNORMAL LOW (ref 36.0–46.0)
Hemoglobin: 10.6 g/dL — ABNORMAL LOW (ref 12.0–15.0)
Immature Granulocytes: 1 %
Lymphocytes Relative: 6 %
Lymphs Abs: 0.6 10*3/uL — ABNORMAL LOW (ref 0.7–4.0)
MCH: 24.7 pg — ABNORMAL LOW (ref 26.0–34.0)
MCHC: 30.7 g/dL (ref 30.0–36.0)
MCV: 80.2 fL (ref 80.0–100.0)
Monocytes Absolute: 0.7 10*3/uL (ref 0.1–1.0)
Monocytes Relative: 6 %
Neutro Abs: 9.5 10*3/uL — ABNORMAL HIGH (ref 1.7–7.7)
Neutrophils Relative %: 87 %
Platelets: 289 10*3/uL (ref 150–400)
RBC: 4.3 MIL/uL (ref 3.87–5.11)
RDW: 17.8 % — ABNORMAL HIGH (ref 11.5–15.5)
WBC: 10.9 10*3/uL — ABNORMAL HIGH (ref 4.0–10.5)
nRBC: 0 % (ref 0.0–0.2)

## 2022-11-09 LAB — URINALYSIS, ROUTINE W REFLEX MICROSCOPIC
Bilirubin Urine: NEGATIVE
Glucose, UA: 50 mg/dL — AB
Ketones, ur: NEGATIVE mg/dL
Nitrite: NEGATIVE
Protein, ur: 100 mg/dL — AB
RBC / HPF: >50 RBC/hpf (ref 0–5)
Specific Gravity, Urine: 1.013 (ref 1.005–1.030)
pH: 5 (ref 5.0–8.0)

## 2022-11-09 LAB — CBG MONITORING, ED: Glucose-Capillary: 124 mg/dL — ABNORMAL HIGH (ref 70–99)

## 2022-11-09 LAB — PROCALCITONIN: Procalcitonin: 3.87 ng/mL

## 2022-11-09 LAB — HEMOGLOBIN A1C
Hgb A1c MFr Bld: 4.9 % (ref 4.8–5.6)
Mean Plasma Glucose: 93.93 mg/dL

## 2022-11-09 LAB — TSH: TSH: <0.01 u[IU]/mL — ABNORMAL LOW (ref 0.350–4.500)

## 2022-11-09 LAB — LACTIC ACID, PLASMA: Lactic Acid, Venous: 2.2 mmol/L (ref 0.5–1.9)

## 2022-11-09 LAB — TROPONIN I (HIGH SENSITIVITY)
Troponin I (High Sensitivity): 15 ng/L (ref <18)
Troponin I (High Sensitivity): 15 ng/L (ref <18)

## 2022-11-09 MED ORDER — PANTOPRAZOLE SODIUM 40 MG IV SOLR
40.0000 mg | Freq: Once | INTRAVENOUS | Status: AC
  Administered 2022-11-09: 40 mg via INTRAVENOUS
  Filled 2022-11-09: qty 10

## 2022-11-09 MED ORDER — SODIUM CHLORIDE 0.9 % IV SOLN: 500.0000 mg | INTRAVENOUS | Status: DC

## 2022-11-09 MED ORDER — SODIUM CHLORIDE 0.9 % IV SOLN
1.0000 g | Freq: Once | INTRAVENOUS | Status: AC
  Administered 2022-11-09: 1 g via INTRAVENOUS
  Filled 2022-11-09: qty 10

## 2022-11-09 MED ORDER — INSULIN ASPART 100 UNIT/ML IJ SOLN
0.0000 [IU] | Freq: Three times a day (TID) | INTRAMUSCULAR | Status: DC
  Filled 2022-11-09: qty 0.15

## 2022-11-09 MED ORDER — ALUM & MAG HYDROXIDE-SIMETH 200-200-20 MG/5ML PO SUSP
30.0000 mL | Freq: Once | ORAL | Status: AC
  Administered 2022-11-09: 30 mL via ORAL
  Filled 2022-11-09: qty 30

## 2022-11-09 MED ORDER — DIPHENHYDRAMINE HCL 12.5 MG/5ML PO ELIX
25.0000 mg | ORAL_SOLUTION | Freq: Once | ORAL | Status: AC
  Administered 2022-11-09: 25 mg via ORAL
  Filled 2022-11-09: qty 10

## 2022-11-09 MED ORDER — ENOXAPARIN SODIUM 40 MG/0.4ML IJ SOSY
40.0000 mg | PREFILLED_SYRINGE | Freq: Every day | INTRAMUSCULAR | Status: DC
  Administered 2022-11-09 – 2022-11-12 (×4): 40 mg via SUBCUTANEOUS
  Filled 2022-11-09 (×4): qty 0.4

## 2022-11-09 MED ORDER — SODIUM CHLORIDE 0.9 % IV SOLN
500.0000 mg | Freq: Once | INTRAVENOUS | Status: AC
  Administered 2022-11-09: 500 mg via INTRAVENOUS
  Filled 2022-11-09: qty 5

## 2022-11-09 MED ORDER — ROSUVASTATIN CALCIUM 5 MG PO TABS
5.0000 mg | ORAL_TABLET | Freq: Every day | ORAL | Status: DC
  Administered 2022-11-10 – 2022-11-13 (×4): 5 mg via ORAL
  Filled 2022-11-09 (×5): qty 1

## 2022-11-09 MED ORDER — ACETAMINOPHEN 325 MG PO TABS: 650.0000 mg | ORAL_TABLET | Freq: Four times a day (QID) | ORAL | Status: DC | PRN

## 2022-11-09 MED ORDER — LACTATED RINGERS IV BOLUS
1000.0000 mL | Freq: Once | INTRAVENOUS | Status: AC
  Administered 2022-11-09: 1000 mL via INTRAVENOUS

## 2022-11-09 MED ORDER — ONDANSETRON HCL 4 MG/2ML IJ SOLN: 4.0000 mg | Freq: Four times a day (QID) | INTRAMUSCULAR | Status: DC | PRN

## 2022-11-09 MED ORDER — SODIUM CHLORIDE 0.9 % IV SOLN: 2.0000 g | INTRAVENOUS | Status: DC

## 2022-11-09 MED ORDER — SODIUM CHLORIDE 0.9 % IV SOLN
3.0000 g | Freq: Four times a day (QID) | INTRAVENOUS | Status: DC
  Administered 2022-11-09 – 2022-11-13 (×15): 3 g via INTRAVENOUS
  Filled 2022-11-09 (×15): qty 8

## 2022-11-09 MED ORDER — METOPROLOL SUCCINATE ER 50 MG PO TB24
50.0000 mg | ORAL_TABLET | Freq: Every day | ORAL | Status: DC
  Administered 2022-11-10 – 2022-11-13 (×4): 50 mg via ORAL
  Filled 2022-11-09 (×4): qty 1

## 2022-11-09 MED ORDER — ASPIRIN 81 MG PO TBEC
81.0000 mg | DELAYED_RELEASE_TABLET | Freq: Every day | ORAL | Status: DC
  Administered 2022-11-10 – 2022-11-13 (×4): 81 mg via ORAL
  Filled 2022-11-09 (×4): qty 1

## 2022-11-09 MED ORDER — LEVOTHYROXINE SODIUM 25 MCG PO TABS: 12.5000 ug | ORAL_TABLET | Freq: Every day | ORAL | Status: DC

## 2022-11-09 MED ORDER — AMLODIPINE BESYLATE 10 MG PO TABS
10.0000 mg | ORAL_TABLET | Freq: Every day | ORAL | Status: DC
  Administered 2022-11-10 – 2022-11-13 (×3): 10 mg via ORAL
  Filled 2022-11-09 (×4): qty 1

## 2022-11-09 MED ORDER — LACTATED RINGERS IV SOLN: INTRAVENOUS | Status: DC

## 2022-11-09 MED ORDER — METHYLPREDNISOLONE SODIUM SUCC 125 MG IJ SOLR
60.0000 mg | Freq: Once | INTRAMUSCULAR | Status: AC
  Administered 2022-11-09: 60 mg via INTRAVENOUS
  Filled 2022-11-09: qty 2

## 2022-11-09 MED ORDER — DIPHENHYDRAMINE HCL 12.5 MG/5ML PO ELIX
25.0000 mg | ORAL_SOLUTION | Freq: Four times a day (QID) | ORAL | Status: DC | PRN
  Administered 2022-11-09: 25 mg via ORAL
  Filled 2022-11-09: qty 10

## 2022-11-09 MED ORDER — HYDRALAZINE HCL 50 MG PO TABS
50.0000 mg | ORAL_TABLET | Freq: Every day | ORAL | Status: DC
  Administered 2022-11-10 – 2022-11-11 (×2): 50 mg via ORAL
  Filled 2022-11-09 (×4): qty 1

## 2022-11-09 NOTE — Assessment & Plan Note (Signed)
-   CPAP at bedtime 

## 2022-11-09 NOTE — Assessment & Plan Note (Addendum)
Patient's daughter states she was never formally diagnosed with type 2 diabetes.  Will monitor CBG while admitted but hold off on insulin.

## 2022-11-09 NOTE — H&P (Addendum)
History and Physical    Patient: Michaela Baker NWG:[id number] DOB: 11/01/1950 DOA: 11/09/2022 DOS: the patient was seen and examined on 11/09/2022 PCP: Diamantina Providence, FNP  Patient coming from: Home  Chief Complaint:  Chief Complaint  Patient presents with   Chest Pain   HPI: Michaela Baker is a 72 y.o. female with medical history significant of CKD stage IIIa, hypertension, HFpEF, type 2 diabetes (diet-controlled), hypothyroidism, who presents to the ED due to chest pain.  History obtained from both Mrs. Dobler and from her daughter at bedside, Shuntel.  Mrs. Dolby states that yesterday morning, she had gradually progressive right-sided chest pain that was right above her right breast.  Due to that, she went to the ED.  After being sent home the chest pain continued.  In addition, her daughter noticed that she was coughing up bright red-tinged sputum.  She believes that it might have been the Pedialyte that was a similar color that patient drink shortly before.  Afterwards, patient drank a green slur be and shortly thereafter, spit up green phlegm.  Due to this, patient was brought back to the ED.  Prior to coming to the ED, they sprayed some Biotene for dry mouth and noticed that her tongue began to swell afterwards; this was her first time using Biotene.  No nausea, vomiting, diarrhea, abdominal pain, lower extremity swelling, orthopnea.  Mrs. Tegtmeyer states that she has had a very poor appetite for several months now and has been losing weight due to this.  She notes that she feels very full after 1-2 bites of food.  She denies any coughing spells while eating or afterwards.  She denies any difficulty with swallowing.  Per chart review, patient has been seen twice by her PCP in April and then in May for unintentional weight loss and poor p.o. intake, stating she is having difficulty with eating and early satiety.  BMI previously 45, now 30.  ED course: On arrival to the ED, patient was  normotensive at 128/77 with heart rate of 101.  She was saturating at 95% on room air with tachypnea 23/minute.  She was afebrile at 98.6.  Patient desaturated to 88% with ambulation to the restroom.  Initial workup notable for WBC of 10.9, hemoglobin 10.6, sodium 134, bicarb 14, potassium 5.0, BUN 47, creatinine 1.69, and GFR 32.  Troponin negative at 15. Diabetes chest x-ray was obtained that demonstrated new consolidative opacity seen along the right lower lobe new from previous x-ray.  Patient started on azithromycin and ceftriaxone.  TRH contacted for admission.  Review of Systems: As mentioned in the history of present illness. All other systems reviewed and are negative.  Past Medical History:  Diagnosis Date   CHF (congestive heart failure) (HCC)    Hypertension    Hypothyroidism 03/29/2017   Obesity (BMI 30-39.9)    Premature ventricular contractions    Past Surgical History:  Procedure Laterality Date   ABDOMINAL HYSTERECTOMY     CHOLECYSTECTOMY     COLONOSCOPY WITH PROPOFOL N/A 12/29/2021   Procedure: COLONOSCOPY WITH PROPOFOL;  Surgeon: Kathi Der, MD;  Location: [facility];  Service: Gastroenterology;  Laterality: N/A;   ENDOVENOUS ABLATION SAPHENOUS VEIN W/ LASER Right 01/26/2022   endovenous laser ablation right greater saphenous vein by Cari Caraway MD   TONSILLECTOMY     TUBAL LIGATION     Social History:  reports that she has never smoked. She has never used smokeless tobacco. She reports that she does not drink  alcohol and does not use drugs.  No Known Allergies  Family History  Problem Relation Age of Onset   Diabetes Mother    Heart failure Mother    Kidney failure Mother    Kidney failure Sister    Diabetes Sister    Heart attack Sister    Breast cancer Neg Hx     Prior to Admission medications   Medication Sig Start Date End Date Taking? Authorizing Provider  acetaminophen (TYLENOL) 500 MG tablet Take 1,000 mg by mouth as needed (pain).   Yes  [provider]  amLODipine (NORVASC) 10 MG tablet Take 10 mg by mouth daily. 02/04/20  Yes [provider]  aspirin EC 81 MG EC tablet Take 1 tablet (81 mg total) by mouth daily. 07/26/13  Yes Leatha Gilding, MD  dapagliflozin propanediol (FARXIGA) 10 MG TABS tablet Take 10 mg by mouth daily.   Yes [provider]  furosemide (LASIX) 40 MG tablet Take 1 tablet (40 mg total) by mouth 2 (two) times daily. 06/04/22  Yes Nooruddin, Jason Fila, MD  hydrALAZINE (APRESOLINE) 50 MG tablet Take 50 mg by mouth daily.   Yes [provider]  KERENDIA 10 MG TABS take 1 tablet by mouth daily for chronic kidney disease   Yes [provider]  levothyroxine (SYNTHROID, LEVOTHROID) 25 MCG tablet Take 12.5 mcg by mouth daily. 02/05/15  Yes [provider]  megestrol (MEGACE) 40 MG tablet Take 1 tablet by mouth 2 (two) times daily. 10/28/22  Yes [provider]  metoprolol succinate (TOPROL-XL) 50 MG 24 hr tablet Take 50 mg by mouth daily. Take with or immediately following a meal.   Yes [provider]  Multiple Vitamins-Minerals (MULTIVITAMIN WITH MINERALS) tablet Take 2 tablets by mouth daily.   Yes [provider]  potassium chloride SA (KLOR-CON M) 20 MEQ tablet Take 20 mEq by mouth daily.   Yes [provider]  rosuvastatin (CRESTOR) 5 MG tablet Take 5 mg by mouth daily. 05/07/21  Yes [provider]    Physical Exam: Vitals:   11/09/22 1513 11/09/22 1514 11/09/22 1535  BP:  121/65 128/77  Pulse:  100 97  Resp:  20 (!) 23  Temp:  98.6 F (37 C)   TempSrc:  Oral   SpO2:  100% 95%  Height: 5\' 5"  (1.651 m)     Physical Exam Vitals and nursing note reviewed.  Constitutional:      General: She is not in acute distress. HENT:     Head: Normocephalic and atraumatic.     Mouth/Throat:     Mouth: Mucous membranes are dry.     Comments: Tongue swelling noted, however patient is still able to swallow and close her  mouth.  No stridor. Eyes:     Conjunctiva/sclera: Conjunctivae normal.     Pupils: Pupils are equal, round, and reactive to light.  Cardiovascular:     Rate and Rhythm: Normal rate and regular rhythm.     Heart sounds: No murmur heard. Pulmonary:     Effort: Pulmonary effort is normal. No respiratory distress.     Breath sounds: Rales (Right base only with surrounding decreased breath sounds) present. No wheezing or rhonchi.  Abdominal:     General: Bowel sounds are normal. There is no distension.     Palpations: Abdomen is soft.     Tenderness: There is no abdominal tenderness. There is no guarding.     Hernia: No hernia is present.  Musculoskeletal:  Cervical back: Neck supple.     Right lower leg: No edema.     Left lower leg: No edema.  Skin:    General: Skin is warm and dry.  Neurological:     Mental Status: She is alert.     Comments: Patient is alert and oriented x 3.  4/5 strength throughout.  Psychiatric:        Mood and Affect: Mood normal.        Behavior: Behavior normal.    Data Reviewed: CBC with WBC 10.9, hemoglobin 10.6, MCV 80, platelets of 289 CMP with sodium 134, potassium 5.0, bicarb 14, glucose 112, BUN 47, creatinine 1.69, GFR 32, AST 20, ALT 10, alkaline phosphatase 37, albumin 2.5 Troponin negative at 15  EKG personally reviewed.  Sinus rhythm with rate of 100.  Frequent PVCs.  No ischemic appearing ST or T wave changes.  DG Chest Portable 1 View  Result Date: 11/09/2022 CLINICAL DATA:  Chest pain EXAM: PORTABLE CHEST 1 VIEW COMPARISON:  X-ray 11/07/2022 and older FINDINGS: Development of a area of consolidative opacity in the right mid to lower lung. This could be lower lobe. Mild linear opacity left lung base likely scar or atelectasis. No pneumothorax, effusion or edema. Normal cardiopericardial silhouette. Calcified aorta. Overlapping cardiac leads. Degenerative changes seen along the spine and shoulders. IMPRESSION: Consolidative opacity seen  along the right lower lobe new from previous. Possible acute infiltrate recommend follow-up. Mild left basilar atelectasis. Electronically Signed   By: Karen Kays M.D.   On: 11/09/2022 16:24    Results are pending, will review when available.  Assessment and Plan:  Aspiration pneumonia (HCC) Patient is presenting with right-sided chest pain and subsequent spitting up sputum that is tinged the color of what she most recently ate or drink.  This is most concerning for silent aspiration.  Lobar pneumonia seen on chest x-ray that is new compared to x-ray 24 prior.   - Continuous pulse ox telemetry - Continue supplemental oxygen to maintain oxygen saturation above 92% - Continue azithromycin and ceftriaxone.  No indication at this time for anaerobic coverage - CT of the chest without contrast for further characterization - SLP evaluation - Dysphagia diet until evaluated by SLP  Tongue swelling On examination, there is mild tongue swelling noted with no evidence of airway compromise.  Likely an allergic reaction to Biotene that patient used shortly before onset.   - Benadryl 25 mg once - Frequent reassessment - Counseled patient on importance to alert medical team if there is any further worsening - Continuous pulse oximetry  Metabolic acidosis Significant metabolic acidosis noted on CMP, first noted about 1 month ago, but it has worsened.  Etiology is undetermined but differential includes medication induced.  Given mild hyperkalemia, finerenone could be the culprit.  Low suspicion for lactic acidosis.  - Lactic acid pending - Repeat bicarb in the a.m. - Hold home finerenone  Abnormal weight loss Unintentional weight loss with poor p.o. intake over the last several months; per chart review, approximately 25 pound in the last 6 months.  She has been referred to GI with an appointment tomorrow.  Colonoscopy and mammogram completed within the last year.  - Dietitian consultation -  Continue outpatient follow-up with PCP and gastroenterology - Given early satiety, could consider gastroparesis and trial empiric Reglan  Generalized weakness Likely due to poor p.o. intake and deconditioning.  No focal symptoms on exam to suggest CVA  - PT/OT evaluations  Acute kidney injury superimposed on  chronic kidney disease (HCC) Renal function appears to be fluctuating over the last year, however baseline seems to be around 1.3-1.4.  Currently 1.69.  Creatinine was 2.05 on 6/10 with improvement after IV fluids.  Will hold home diuretics and provide gentle IV rehydration.  - Hold home nephrotoxic agents - Gentle IV fluids - Repeat BMP in the a.m. - Bladder scan  AMS (altered mental status) Patient's daughter notes intermittent altered mental status patient seems confused on what she meant to do.  In addition, patient has been sleeping most of the day.  No focal findings on exam at this time to suggest acute CVA.  Likely multifactorial.  - Will start workup with urinalysis and TSH  Type 2 diabetes mellitus (HCC) Per chart review, patient's diabetes is well-controlled with Marcelline Deist with last A1c within normal limits.  - Hold home Farxiga - SSI, moderate  OSA (obstructive sleep apnea) - CPAP at bedtime  Hypertension - Hold home diuretics - Continue home amlodipine and metoprolol  CHF (congestive heart failure) (HCC) Patient appears hypovolemic at this time.  - Hold home diuretics - Daily weights - Strict in and out  Advance Care Planning:   Code Status: Full Code verified by patient with her daughter at bedside  Consults: None  Family Communication: Patient's daughter updated at bedside  Severity of Illness: The appropriate patient status for this patient is OBSERVATION. Observation status is judged to be reasonable and necessary in order to provide the required intensity of service to ensure the patient's safety. The patient's presenting symptoms, physical exam  findings, and initial radiographic and laboratory data in the context of their medical condition is felt to place them at decreased risk for further clinical deterioration. Furthermore, it is anticipated that the patient will be medically stable for discharge from the hospital within 2 midnights of admission.   Author: Verdene Lennert, MD 11/09/2022 7:01 PM  For on call review [email or web address].

## 2022-11-09 NOTE — Assessment & Plan Note (Signed)
Patient appears hypovolemic at this time.  - Hold home diuretics - Daily weights - Strict in and out

## 2022-11-09 NOTE — Assessment & Plan Note (Addendum)
Unintentional weight loss with poor p.o. intake over the last several months; per chart review, approximately 25 pound in the last 6 months.  She has been referred to GI with an appointment tomorrow.  Colonoscopy and mammogram completed within the last year.  - Dietitian consultation - Continue outpatient follow-up with PCP and gastroenterology - Given early satiety, could consider gastroparesis and trial empiric Reglan

## 2022-11-09 NOTE — Assessment & Plan Note (Signed)
Patient's daughter notes intermittent altered mental status patient seems confused on what she meant to do.  In addition, patient has been sleeping most of the day.  No focal findings on exam at this time to suggest acute CVA.  Likely multifactorial.  - Will start workup with urinalysis and TSH

## 2022-11-09 NOTE — Assessment & Plan Note (Signed)
-   Hold home diuretics - Continue home amlodipine and metoprolol

## 2022-11-09 NOTE — Assessment & Plan Note (Signed)
Renal function appears to be fluctuating over the last year, however baseline seems to be around 1.3-1.4.  Currently 1.69.  Creatinine was 2.05 on 6/10 with improvement after IV fluids.  Will hold home diuretics and provide gentle IV rehydration.  - Hold home nephrotoxic agents - Gentle IV fluids - Repeat BMP in the a.m. - Bladder scan

## 2022-11-09 NOTE — Assessment & Plan Note (Signed)
Likely due to poor p.o. intake and deconditioning.  No focal symptoms on exam to suggest CVA  - PT/OT evaluations

## 2022-11-09 NOTE — Progress Notes (Signed)
Pharmacy Antibiotic Note  Michaela Baker is a 72 y.o. female admitted on 11/09/2022 with pneumonia.  Pharmacy has been consulted for Unasyn dosing.  Active Problem(s): CP, poor appetite, weight loss, tongue swelling after using Biotene, metabolic acidosis, AKI, AMS  PMH: CKD stage IIIa, hypertension, HFpEF, type 2 diabetes (diet-controlled), hypothyroidism, CHF, PVCs, OSA,    ID: Aspiration PNA with a large cavitary lesion on CT. - Afebrile, WBC 10.9, Scr 1.69  Unasyn 6/12>>  Plan: Unasyn 3g IV q6hr.    Height: 5\' 5"  (165.1 cm) IBW/kg (Calculated) : 57  Temp (24hrs), Avg:98.6 F (37 C), Min:98.6 F (37 C), Max:98.6 F (37 C)  Recent Labs  Lab 11/07/22 2000 11/09/22 1625 11/09/22 1901  WBC 9.3 10.9*  --   CREATININE 2.05* 1.69*  --   LATICACIDVEN  --   --  2.2*    Estimated Creatinine Clearance: 31.8 mL/min (A) (by C-G formula based on SCr of 1.69 mg/dL (H)).    No Known Allergies  Michaela Baker S. Merilynn Finland, PharmD, BCPS Clinical Staff Pharmacist [email or web address]  Pasty Spillers 11/09/2022 8:32 PM

## 2022-11-09 NOTE — ED Provider Notes (Signed)
[place] [facility] Provider Note   CSN: [id number] Arrival date & time: 11/09/22  1506     History Chief Complaint  Patient presents with   Chest Pain    HPI Michaela Baker is a 72 y.o. female presenting for chief complaint of epigastric pain and hematemesis.  States that she was just sitting when all of a sudden she had had a large amount of bloody material in his mouth. She spat it out.  She denied any coughing leading into it stated felt more like vomiting.  She has had poor p.o. intake for 2 weeks was seen for chest pain and discomfort that was nonspecific.  Has not been improving with anti-inflammatories. Started to localize more to her epigastrium.  She has no appetite and states that as soon as she eats she feels full.  Patient's recorded medical, surgical, social, medication list and allergies were reviewed in the Snapshot window as part of the initial history.   Review of Systems   Review of Systems  Constitutional:  Positive for appetite change and fatigue. Negative for chills and fever.  HENT:  Negative for ear pain and sore throat.   Eyes:  Negative for pain and visual disturbance.  Respiratory:  Negative for cough and shortness of breath.   Cardiovascular:  Negative for chest pain and palpitations.  Gastrointestinal:  Positive for nausea. Negative for abdominal pain and vomiting.  Genitourinary:  Negative for dysuria and hematuria.  Musculoskeletal:  Negative for arthralgias, back pain and neck pain.  Skin:  Negative for color change and rash.  Neurological:  Negative for seizures and syncope.  All other systems reviewed and are negative.   Physical Exam Updated Vital Signs BP 128/77   Pulse 97   Temp 98.6 F (37 C) (Oral)   Resp (!) 23   Ht 5\' 5"  (1.651 m)   SpO2 95%   BMI 30.08 kg/m  Physical Exam Vitals and nursing note reviewed.  Constitutional:      General: She is not in acute distress.    Appearance: She is  well-developed.  HENT:     Head: Normocephalic and atraumatic.  Eyes:     Conjunctiva/sclera: Conjunctivae normal.  Cardiovascular:     Rate and Rhythm: Normal rate and regular rhythm.     Heart sounds: No murmur heard. Pulmonary:     Effort: Pulmonary effort is normal. No respiratory distress.     Breath sounds: Normal breath sounds.  Abdominal:     General: There is no distension.     Palpations: Abdomen is soft.     Tenderness: There is abdominal tenderness. There is no right CVA tenderness or left CVA tenderness.  Musculoskeletal:        General: No swelling or tenderness. Normal range of motion.     Cervical back: Neck supple.  Skin:    General: Skin is warm and dry.  Neurological:     General: No focal deficit present.     Mental Status: She is alert and oriented to person, place, and time. Mental status is at baseline.     Cranial Nerves: No cranial nerve deficit.      ED Course/ Medical Decision Making/ A&P    Procedures Procedures   Medications Ordered in ED Medications  cefTRIAXone (ROCEPHIN) 1 g in sodium chloride 0.9 % 100 mL IVPB (has no administration in time range)  azithromycin (ZITHROMAX) 500 mg in sodium chloride 0.9 % 250 mL IVPB (has no  administration in time range)  pantoprazole (PROTONIX) injection 40 mg (40 mg Intravenous Given 11/09/22 1706)  alum & mag hydroxide-simeth (MAALOX/MYLANTA) 200-200-20 MG/5ML suspension 30 mL (30 mLs Oral Given 11/09/22 1707)  lactated ringers bolus 1,000 mL (1,000 mLs Intravenous New Bag/Given 11/09/22 1707)    Medical Decision Making:    Michaela Baker is a 72 y.o. female who presented to the ED today with multiple complaints detailed above.     Additional history discussed with patient's family/caregivers.  Patient placed on continuous vitals and telemetry monitoring while in ED which was reviewed periodically.   Complete initial physical exam performed, notably the patient  was hemodynamically stable in no acute  distress.  She has no acute complaints.  She is slightly tachycardic in the room.  Mouth exam with no focal pathology.    Reviewed and confirmed nursing documentation for past medical history, family history, social history.    Initial Assessment:   With the patient's presentation of epigastric discomfort, extensive chest pain history which has been difficult to diagnose, interval worsening symptoms, most likely diagnosis is developing peptic ulcer disease.  History of hematemesis today raise concern for possible bleeding event.. Other diagnoses were considered including (but not limited to) pulmonary embolism and hemoptysis however lack of coughing seems less likely.  Volume overload and pulmonary edema causing bloody pulmonary edema however this also seems less likely.  ACS, boorhave also on the differential.. These are considered less likely due to history of present illness and physical exam findings.   This is most consistent with an acute life/limb threatening illness complicated by underlying chronic conditions.  Initial Plan:  Screening labs including CBC and Metabolic panel to evaluate for infectious or metabolic etiology of disease.  Urinalysis with reflex culture ordered to evaluate for UTI or relevant urologic/nephrologic pathology.  CXR to evaluate for structural/infectious intrathoracic pathology.  EKG to evaluate for cardiac pathology. Objective evaluation as below reviewed with plan for close reassessment  Initial Study Results:   Laboratory  All laboratory results reviewed without evidence of clinically relevant pathology.   Exceptions include: CKD, leukocytosis  EKG EKG was reviewed independently. Rate, rhythm, axis, intervals all examined and without medically relevant abnormality. ST segments without concerns for elevations.    Radiology  All images reviewed independently. Agree with radiology report at this time.   DG Chest Portable 1 View  Result Date:  11/09/2022 CLINICAL DATA:  Chest pain EXAM: PORTABLE CHEST 1 VIEW COMPARISON:  X-ray 11/07/2022 and older FINDINGS: Development of a area of consolidative opacity in the right mid to lower lung. This could be lower lobe. Mild linear opacity left lung base likely scar or atelectasis. No pneumothorax, effusion or edema. Normal cardiopericardial silhouette. Calcified aorta. Overlapping cardiac leads. Degenerative changes seen along the spine and shoulders. IMPRESSION: Consolidative opacity seen along the right lower lobe new from previous. Possible acute infiltrate recommend follow-up. Mild left basilar atelectasis. Electronically Signed   By: Karen Kays M.D.   On: 11/09/2022 16:24     Consults:  Case discussed with hospitalist.   Final Assessment and Plan:   Unanticipated right middle lobe opacity and rising white blood cell count.  Clinical history is concerning for developing pneumonia with malaise fatigue and shortness of breath with new oxygen requirement of 2 L.  Consult with hospitalist, start patient on treatment for treat acquired pneumonia with ceftriaxone azithromycin will arrange for admission for further care and management.  Disposition:   Based on the above findings, I believe this  patient is stable for admission.    Patient/family educated about specific findings on our evaluation and explained exact reasons for admission.  Patient/family educated about clinical situation and time was allowed to answer questions.   Admission team communicated with and agreed with need for admission. Patient admitted. Patient ready to move at this time.     Emergency Department Medication Summary:   Medications  cefTRIAXone (ROCEPHIN) 1 g in sodium chloride 0.9 % 100 mL IVPB (has no administration in time range)  azithromycin (ZITHROMAX) 500 mg in sodium chloride 0.9 % 250 mL IVPB (has no administration in time range)  pantoprazole (PROTONIX) injection 40 mg (40 mg Intravenous Given 11/09/22 1706)   alum & mag hydroxide-simeth (MAALOX/MYLANTA) 200-200-20 MG/5ML suspension 30 mL (30 mLs Oral Given 11/09/22 1707)  lactated ringers bolus 1,000 mL (1,000 mLs Intravenous New Bag/Given 11/09/22 1707)         Clinical Impression:  1. Pneumonia of right middle lobe due to infectious organism      Admit   Final Clinical Impression(s) / ED Diagnoses Final diagnoses:  Pneumonia of right middle lobe due to infectious organism    Rx / DC Orders ED Discharge Orders     None         Glyn Ade, MD 11/09/22 1753

## 2022-11-09 NOTE — Assessment & Plan Note (Signed)
On examination, there is mild tongue swelling noted with no evidence of airway compromise.  Likely an allergic reaction to Biotene that patient used shortly before onset.   - Benadryl 25 mg once - Frequent reassessment - Counseled patient on importance to alert medical team if there is any further worsening - Continuous pulse oximetry

## 2022-11-09 NOTE — Assessment & Plan Note (Signed)
Patient is presenting with right-sided chest pain and subsequent spitting up sputum that is tinged the color of what she most recently ate or drink.  This is most concerning for silent aspiration.  Lobar pneumonia seen on chest x-ray that is new compared to x-ray 24 prior.   - Continuous pulse ox telemetry - Continue supplemental oxygen to maintain oxygen saturation above 92% - Continue azithromycin and ceftriaxone.  No indication at this time for anaerobic coverage - CT of the chest without contrast for further characterization - SLP evaluation - Dysphagia diet until evaluated by SLP

## 2022-11-09 NOTE — ED Triage Notes (Signed)
Patient presented to ER for chest pain and coughing blood and decreased appetite.Patient seen for this yesterday at [facility].

## 2022-11-09 NOTE — Progress Notes (Addendum)
  Progress Note  # Tongue Swelling Swelling has continued to persist, mildly worsened despite Benadryl.  No evidence of respiratory compromise or difficulty controlling secretions.  Continue to suspect this is allergic in etiology without evidence of anaphylaxis.  Patient is not on any ACEis.   - Continue Benadryl every 6 hours as tolerated - Solu-Medrol 60 mg once - Transferred to progressive unit for close monitoring - Vital signs every 4 hours  # Aspiration Pneumonia CT of the chest notable for a large cavitary lesion measuring approximately 4.7 cm x 4 x 3 cm with thick walls.  - Switch from ceftriaxone/azithromycin to Unasyn - May benefit from pulmonology versus ID consultation if no improvement - MRSA PCR  Author: Verdene Lennert, MD 11/09/2022 8:11 PM  For on call review [email or web address].

## 2022-11-09 NOTE — Assessment & Plan Note (Signed)
Significant metabolic acidosis noted on CMP, first noted about 1 month ago, but it has worsened.  Etiology is undetermined but differential includes medication induced.  Given mild hyperkalemia, finerenone could be the culprit.  Low suspicion for lactic acidosis.  - Lactic acid pending - Repeat bicarb in the a.m. - Hold home finerenone

## 2022-11-10 ENCOUNTER — Ambulatory Visit: Payer: 59 | Admitting: Internal Medicine

## 2022-11-10 ENCOUNTER — Telehealth: Payer: Self-pay | Admitting: Pulmonary Disease

## 2022-11-10 DIAGNOSIS — J189 Pneumonia, unspecified organism: Secondary | ICD-10-CM | POA: Diagnosis not present

## 2022-11-10 DIAGNOSIS — J984 Other disorders of lung: Secondary | ICD-10-CM | POA: Diagnosis not present

## 2022-11-10 DIAGNOSIS — J690 Pneumonitis due to inhalation of food and vomit: Secondary | ICD-10-CM | POA: Diagnosis not present

## 2022-11-10 LAB — BASIC METABOLIC PANEL
Anion gap: 11 (ref 5–15)
BUN: 44 mg/dL — ABNORMAL HIGH (ref 8–23)
CO2: 16 mmol/L — ABNORMAL LOW (ref 22–32)
Calcium: 9.4 mg/dL (ref 8.9–10.3)
Chloride: 108 mmol/L (ref 98–111)
Creatinine, Ser: 1.62 mg/dL — ABNORMAL HIGH (ref 0.44–1.00)
GFR, Estimated: 34 mL/min — ABNORMAL LOW (ref >60)
Glucose, Bld: 147 mg/dL — ABNORMAL HIGH (ref 70–99)
Potassium: 5.3 mmol/L — ABNORMAL HIGH (ref 3.5–5.1)
Sodium: 135 mmol/L (ref 135–145)

## 2022-11-10 LAB — CBC WITH DIFFERENTIAL/PLATELET
Abs Immature Granulocytes: 0.04 10*3/uL (ref 0.00–0.07)
Basophils Absolute: 0 10*3/uL (ref 0.0–0.1)
Basophils Relative: 0 %
Eosinophils Absolute: 0 10*3/uL (ref 0.0–0.5)
Eosinophils Relative: 0 %
HCT: 33.5 % — ABNORMAL LOW (ref 36.0–46.0)
Hemoglobin: 10.2 g/dL — ABNORMAL LOW (ref 12.0–15.0)
Immature Granulocytes: 1 %
Lymphocytes Relative: 4 %
Lymphs Abs: 0.3 10*3/uL — ABNORMAL LOW (ref 0.7–4.0)
MCH: 24.9 pg — ABNORMAL LOW (ref 26.0–34.0)
MCHC: 30.4 g/dL (ref 30.0–36.0)
MCV: 81.7 fL (ref 80.0–100.0)
Monocytes Absolute: 0.1 10*3/uL (ref 0.1–1.0)
Monocytes Relative: 1 %
Neutro Abs: 7.1 10*3/uL (ref 1.7–7.7)
Neutrophils Relative %: 94 %
Platelets: 267 10*3/uL (ref 150–400)
RBC: 4.1 MIL/uL (ref 3.87–5.11)
RDW: 17.8 % — ABNORMAL HIGH (ref 11.5–15.5)
WBC: 7.6 10*3/uL (ref 4.0–10.5)
nRBC: 0 % (ref 0.0–0.2)

## 2022-11-10 LAB — GLUCOSE, CAPILLARY
Glucose-Capillary: 148 mg/dL — ABNORMAL HIGH (ref 70–99)
Glucose-Capillary: 140 mg/dL — ABNORMAL HIGH (ref 70–99)
Glucose-Capillary: 128 mg/dL — ABNORMAL HIGH (ref 70–99)
Glucose-Capillary: 158 mg/dL — ABNORMAL HIGH (ref 70–99)
Glucose-Capillary: 149 mg/dL — ABNORMAL HIGH (ref 70–99)

## 2022-11-10 LAB — MRSA NEXT GEN BY PCR, NASAL: MRSA by PCR Next Gen: NOT DETECTED

## 2022-11-10 LAB — CULTURE, BLOOD (ROUTINE X 2)
Culture: NO GROWTH
Special Requests: ADEQUATE

## 2022-11-10 LAB — STREP PNEUMONIAE URINARY ANTIGEN: Strep Pneumo Urinary Antigen: NEGATIVE

## 2022-11-10 LAB — T4, FREE: Free T4: 1.52 ng/dL — ABNORMAL HIGH (ref 0.61–1.12)

## 2022-11-10 LAB — HIV ANTIBODY (ROUTINE TESTING W REFLEX): HIV Screen 4th Generation wRfx: NONREACTIVE

## 2022-11-10 MED ORDER — ADULT MULTIVITAMIN W/MINERALS CH
1.0000 | ORAL_TABLET | Freq: Every day | ORAL | Status: DC
  Administered 2022-11-11 – 2022-11-13 (×3): 1 via ORAL
  Filled 2022-11-10 (×3): qty 1

## 2022-11-10 MED ORDER — POLYETHYLENE GLYCOL 3350 17 G PO PACK: 17.0000 g | PACK | Freq: Every day | ORAL | Status: DC | PRN

## 2022-11-10 MED ORDER — FOLIC ACID 1 MG PO TABS
1.0000 mg | ORAL_TABLET | Freq: Every day | ORAL | Status: DC
  Administered 2022-11-10 – 2022-11-13 (×4): 1 mg via ORAL
  Filled 2022-11-10 (×4): qty 1

## 2022-11-10 MED ORDER — SENNOSIDES-DOCUSATE SODIUM 8.6-50 MG PO TABS
1.0000 | ORAL_TABLET | Freq: Two times a day (BID) | ORAL | Status: DC
  Administered 2022-11-10 – 2022-11-13 (×7): 1 via ORAL
  Filled 2022-11-10 (×7): qty 1

## 2022-11-10 MED ORDER — SODIUM CHLORIDE 0.45 % IV SOLN
INTRAVENOUS | Status: DC
  Filled 2022-11-10 (×4): qty 75

## 2022-11-10 MED ORDER — BISACODYL 10 MG RE SUPP: 10.0000 mg | Freq: Every day | RECTAL | Status: DC | PRN

## 2022-11-10 MED ORDER — ENSURE ENLIVE PO LIQD
237.0000 mL | ORAL | Status: DC
  Administered 2022-11-12 – 2022-11-13 (×2): 237 mL via ORAL

## 2022-11-10 NOTE — TOC Initial Note (Addendum)
Transition of Care [facility]) - Initial/Assessment Note    Patient Details  Name: Michaela Baker MRN: [id number] Date of Birth: [birth year]  Transition [facility]) CM/SW Contact:    Howell Rucks, RN Phone Number: 11/10/2022, 8:11 AM  Clinical Narrative:  [facility] consult received for HH/DME needs. PT eval pending, await recommendation. TOC will continue to follow.     -11:08am  Met with pt at bedside to introduce role of TOC/NCM and review for dc needs, PT recommendation for [facility] PT, pt agreeable, no preference, NCM will assist. TOC will continue to follow.      -11:45am [employer] rep-Lynette, accepted for [facility] PT. TOC will continue to follow                 Patient Goals and CMS Choice            Expected Discharge Plan and Services                                              Prior Living Arrangements/Services                       Activities of Daily Living Home Assistive Devices/Equipment: Walker (specify type) ADL Screening (condition at time of admission) Patient's cognitive ability adequate to safely complete daily activities?: Yes Is the patient deaf or have difficulty hearing?: No Does the patient have difficulty seeing, even when wearing glasses/contacts?: No Does the patient have difficulty concentrating, remembering, or making decisions?: No Patient able to express need for assistance with ADLs?: Yes Does the patient have difficulty dressing or bathing?: Yes Independently performs ADLs?: No Communication: Needs assistance Is this a change from baseline?: Change from baseline, expected to last >3 days Dressing (OT): Needs assistance Is this a change from baseline?: Pre-admission baseline Grooming: Needs assistance Is this a change from baseline?: Pre-admission baseline Feeding: Needs assistance Is this a change from baseline?: Pre-admission baseline Bathing: Needs assistance Is this a change from baseline?: Pre-admission baseline Toileting:  Needs assistance Is this a change from baseline?: Pre-admission baseline In/Out Bed: Needs assistance Is this a change from baseline?: Pre-admission baseline Walks in Home: Needs assistance Is this a change from baseline?: Pre-admission baseline Does the patient have difficulty walking or climbing stairs?: Yes Weakness of Legs: Both Weakness of Arms/Hands: Both  Permission Sought/Granted                  Emotional Assessment              Admission diagnosis:  Aspiration pneumonia (HCC) [J69.0] Pneumonia of right middle lobe due to infectious organism [J18.9] Patient Active Problem List   Diagnosis Date Noted   Aspiration pneumonia (HCC) 11/09/2022   Tongue swelling 11/09/2022   Generalized weakness 11/09/2022   Metabolic acidosis 11/09/2022   AMS (altered mental status) 11/09/2022   Acute kidney injury superimposed on chronic kidney disease (HCC) 10/04/2022   Abnormal weight loss 09/23/2022   Candidiasis of mouth 09/23/2022   Chronic ulcer of ankle (HCC) 06/08/2022   Acute hypoxic respiratory failure (HCC) 06/02/2022   Pulmonary hypertension (HCC) 06/01/2022   Moderate tricuspid regurgitation 06/01/2022   Anemia 05/16/2022   Chronic kidney disease due to type 2 diabetes mellitus (HCC) 05/16/2022   Diverticulosis of colon 01/10/2022   GI bleed 12/27/2021   Premature ventricular contractions    Open  wound of lower leg 05/28/2021   Hyperlipidemia 05/07/2021   Sepsis (HCC) 03/04/2021   AKI (acute kidney injury) (HCC) 03/03/2021   Obesity (BMI 30-39.9) 03/03/2021   Hyponatremia 03/03/2021   Abnormal LFTs 03/03/2021   Cellulitis of right lower extremity 03/03/2021   Type 2 diabetes mellitus (HCC) 11/27/2020   Foot pain 08/16/2019   Dyspnea 03/29/2017   Hypothyroidism 03/29/2017   OSA (obstructive sleep apnea) 01/03/2017   Left knee pain 01/03/2017   Benign essential hypertension 10/02/2015   Generalized edema 10/02/2015   Chronic hypokalemia 06/18/2015   MGUS  (monoclonal gammopathy of unknown significance) 07/26/2013   CHF (congestive heart failure) (HCC) 07/22/2013   Hypertension 07/22/2013   Acute exacerbation of CHF (congestive heart failure) (HCC) 07/21/2013   PCP:  Diamantina Providence, FNP Pharmacy:   [facility] [place], [place] - [address] [place] [address] Phone: [phone] Fax: [phone]  [id number] [place], [place] - [address] AT [facility] OF [address] & [address] [facility] [address] Phone: [phone] Fax: [phone]     Social Determinants of Health (SDOH) Social History: SDOH Screenings   Food Insecurity: No Food Insecurity (11/10/2022)  Housing: Low Risk  (11/10/2022)  Transportation Needs: No Transportation Needs (11/10/2022)  Utilities: Not At Risk (11/10/2022)  Tobacco Use: Low Risk  (11/09/2022)   SDOH Interventions:     Readmission Risk Interventions    06/03/2022    4:43 PM  Readmission Risk Prevention Plan  Transportation Screening Complete  PCP or Specialist Appt within 5-7 Days Complete  Home Care Screening Complete  Medication Review (RN CM) Complete

## 2022-11-10 NOTE — Telephone Encounter (Signed)
Needs hospital follow up in 4 to 6 weeks with Dr. Craige Cotta or a nurse practitioner.  Needs a chest xray done at the time of her follow up appointment to assess cavitary pneumonia.

## 2022-11-10 NOTE — Progress Notes (Signed)
Initial Nutrition Assessment  DOCUMENTATION CODES:   Severe malnutrition in context of chronic illness  INTERVENTION:  - Diet per SLP.  - Ensure Plus High Protein po once daily, each supplement provides 350 kcal and 20 grams of protein. - Magic cup BID with meals, each supplement provides 290 kcal and 9 grams of protein - Multivitamin with minerals daily - Monitor weight trends.   NUTRITION DIAGNOSIS:   Severe Malnutrition related to chronic illness as evidenced by moderate fat depletion, severe muscle depletion, energy intake < or equal to 75% for > or equal to 1 month, percent weight loss (27% in 11 months).  GOAL:   Patient will meet greater than or equal to 90% of their needs  MONITOR:   PO intake, Supplement acceptance, Diet advancement, Weight trends  REASON FOR ASSESSMENT:   Consult Assessment of nutrition requirement/status  ASSESSMENT:   72 y.o. female with PMH significant of CKD stage IIIa, HTN, HFpEF, type 2 diabetes (diet-controlled), hypothyroidism, who presented to the ED due to chest pain. Admitted for aspiration pneumonia.  Patient endorses a UBW of 242# and steady weight loss over the past year.  Per EMR, patient weighed at 216#in July and weighed this admission at 157#. This is a 59# or 27% in 11 months, which is significant for the time frame.   Patient attributes weight loss to very poor appetite, taste changes, and very poor intake for almost 1 year. States she eats almost nothing each daily, usually only jello and applesauce. Drinking Ensure but only 1-2 times a week, and not usually all of it.  Takes a multivitamin at home per home meds. Also noted to be prescribed Megace.  Current appetite remains decreased, no intake documented. SLP evaluated patient today and recommended continuing diet as tolerated and undergoing esophagram.  Patient agreeable to receive Ensure and Magic Cup during admission.    Medications reviewed and include: -  Labs  reviewed:  K+ 5.3 Creatinine 1.62 HA1C 4.9   NUTRITION - FOCUSED PHYSICAL EXAM:  Flowsheet Row Most Recent Value  Orbital Region Moderate depletion  Upper Arm Region Mild depletion  Thoracic and Lumbar Region Mild depletion  Buccal Region Moderate depletion  Temple Region Severe depletion  Clavicle Bone Region Severe depletion  Clavicle and Acromion Bone Region Moderate depletion  Scapular Bone Region Unable to assess  Dorsal Hand Moderate depletion  Patellar Region Moderate depletion  Anterior Thigh Region Moderate depletion  Posterior Calf Region Mild depletion  Edema (RD Assessment) None  Hair Reviewed  Eyes Reviewed  Mouth Reviewed  Skin Reviewed  Nails Reviewed       Diet Order:   Diet Order             DIET DYS 3 Room service appropriate? Yes; Fluid consistency: Thin  Diet effective now                   EDUCATION NEEDS:  Education needs have been addressed  Skin:  Skin Assessment: Skin Integrity Issues: Skin Integrity Issues:: Stage II Stage II: Mid Perineum  Last BM:  PTA  Height:  Ht Readings from Last 1 Encounters:  11/09/22 5\' 5"  (1.651 m)   Weight:  Wt Readings from Last 1 Encounters:  11/10/22 71.3 kg     BMI:  Body mass index is 26.14 kg/m.  Estimated Nutritional Needs:  Kcal:  1900-2200 kcals Protein:  85-100 grams Fluid:  >/= 1.9L    Shelle Iron RD, LDN For contact information, refer to [facility].

## 2022-11-10 NOTE — Evaluation (Signed)
Physical Therapy Evaluation Patient Details Name: SKIE VITRANO MRN: [id number] DOB: 1951/02/01 Today's Date: 11/10/2022  History of Present Illness  72 y.o. female who presents to the ED due to chest pain. PMH: CKD stage IIIa, hypertension, HFpEF, type 2 diabetes (diet-controlled), hypothyroidism  Clinical Impression  Pt admitted with above diagnosis.  Pt weaker than her baseline, reports to PT that she amb with a RW some of the time, dtr and grand-dtr assist with ADLs, iADLs.  SpO2=96% on RA and HR in 90s during PT session. Will benefit from PT in acute setting. Pt may need post acute rehab vs post acute rehab in home setting pending progress and family support available    Pt currently with functional limitations due to the deficits listed below (see PT Problem List). Pt will benefit from acute skilled PT to increase their independence and safety with mobility to allow discharge.          Recommendations for follow up therapy are one component of a multi-disciplinary discharge planning process, led by the attending physician.  Recommendations may be updated based on patient status, additional functional criteria and insurance authorization.  Follow Up Recommendations       Assistance Recommended at Discharge Frequent or constant Supervision/Assistance  Patient can return home with the following  A little help with walking and/or transfers;A little help with bathing/dressing/bathroom;Assistance with cooking/housework;Assist for transportation;Help with stairs or ramp for entrance    Equipment Recommendations None recommended by PT  Recommendations for Other Services       Functional Status Assessment Patient has had a recent decline in their functional status and demonstrates the ability to make significant improvements in function in a reasonable and predictable amount of time.     Precautions / Restrictions Precautions Precautions: Fall Restrictions Weight Bearing Restrictions:  No      Mobility  Bed Mobility               General bed mobility comments: in recliner    Transfers Overall transfer level: Needs assistance Equipment used: Rolling walker (2 wheels) Transfers: Sit to/from Stand Sit to Stand: Min guard, Supervision           General transfer comment: cues for hand placement and to power up with LEs    Ambulation/Gait Ambulation/Gait assistance: Min assist, Min guard Gait Distance (Feet): 30 Feet (+15' more) Assistive device: Rolling walker (2 wheels) Gait Pattern/deviations: Trunk flexed, Step-through pattern, Decreased stride length, Knee flexed in stance - left, Knee flexed in stance - right Gait velocity: decr     General Gait Details: verbal and visual cues for proximity to RW and trunk extension; assist end of distance to balance and maneuver RW  Stairs            Wheelchair Mobility    Modified Rankin (Stroke Patients Only)       Balance Overall balance assessment:  (denies any recent falls) Sitting-balance support: Feet supported Sitting balance-Leahy Scale: Good     Standing balance support: Reliant on assistive device for balance, During functional activity, Bilateral upper extremity supported, Single extremity supported, No upper extremity supported Standing balance-Leahy Scale: Fair Standing balance comment: pt is able to stand and manipulate LB garment with close sueprvision, stand and wash hands  with single to no UE support and min.gaurd. reliant on device for gait  Pertinent Vitals/Pain Pain Assessment Pain Assessment: No/denies pain    Home Living Family/patient expects to be discharged to:: Private residence Living Arrangements: Children;Alone Available Help at Discharge: Available 24 hours/day;Other (Comment) (in and out at times, but dtr/grd-dtr available) Type of Home: Apartment Home Access: Level entry       Home Layout: One level Home Equipment:  Agricultural consultant (2 wheels);Cane - quad;Shower seat;BSC/3in1;Grab bars - toilet;Grab bars - tub/shower      Prior Function Prior Level of Function : Needs assist       Physical Assist : ADLs (physical)   ADLs (physical): Bathing;Dressing;IADLs Mobility Comments: ambulates with RW ADLs Comments: pt reports her dtr assists with bathing, dressing, meals, errands     Hand Dominance   Dominant Hand: Right    Extremity/Trunk Assessment   Upper Extremity Assessment Upper Extremity Assessment: Defer to OT evaluation LUE Deficits / Details: 3+/5 shoulder and grip    Lower Extremity Assessment Lower Extremity Assessment: Generalized weakness;RLE deficits/detail RLE Deficits / Details: overall 3+/5 bil LEs; AROM grossly WFL       Communication   Communication: No difficulties  Cognition Arousal/Alertness: Awake/alert Behavior During Therapy: WFL for tasks assessed/performed Overall Cognitive Status: Within Functional Limits for tasks assessed                                 General Comments: requires occasional repetition for safety cues        General Comments      Exercises     Assessment/Plan    PT Assessment Patient needs continued PT services  PT Problem List Decreased strength;Decreased range of motion;Decreased activity tolerance;Decreased balance;Decreased mobility;Decreased knowledge of precautions;Decreased knowledge of use of DME;Decreased safety awareness       PT Treatment Interventions DME instruction;Gait training;Functional mobility training;Therapeutic activities;Patient/family education;Therapeutic exercise    PT Goals (Current goals can be found in the Care Plan section)  Acute Rehab PT Goals PT Goal Formulation: With patient Time For Goal Achievement: 11/24/22 Potential to Achieve Goals: Good    Frequency Min 1X/week     Co-evaluation               AM-PAC PT "6 Clicks" Mobility  Outcome Measure Help needed turning from  your back to your side while in a flat bed without using bedrails?: A Little Help needed moving from lying on your back to sitting on the side of a flat bed without using bedrails?: A Little Help needed moving to and from a bed to a chair (including a wheelchair)?: A Little Help needed standing up from a chair using your arms (e.g., wheelchair or bedside chair)?: A Little Help needed to walk in hospital room?: A Little Help needed climbing 3-5 steps with a railing? : A Lot 6 Click Score: 17    End of Session Equipment Utilized During Treatment: Gait belt Activity Tolerance: Patient tolerated treatment well Patient left: in chair;with call bell/phone within reach;with chair alarm set;Other (comment) (pulmonologist)   PT Visit Diagnosis: Other abnormalities of gait and mobility (R26.89);Difficulty in walking, not elsewhere classified (R26.2)    Time: 8756-4332 PT Time Calculation (min) (ACUTE ONLY): 20 min   Charges:   PT Evaluation $PT Eval Low Complexity: 1 Low          Tawan Degroote, PT  Acute Rehab Dept [facility]) [phone]  11/10/2022   [facility] 11/10/2022, 10:54 AM

## 2022-11-10 NOTE — Progress Notes (Signed)
Asked PT about cpap, she states she does not use one at home, and does not want one.

## 2022-11-10 NOTE — Consult Note (Signed)
NAME:  Michaela Baker, MRN:  [id number], DOB:  04-Sep-1950, LOS: 1 ADMISSION DATE:  11/09/2022, CONSULTATION DATE:  11/10/2022 REFERRING MD:  Dr. Pauletta Browns, [facility], CHIEF COMPLAINT:  chest pain   History of Present Illness:  72 yo female presented to the ER with Rt sided chest pain and coughing.  She has been losing weight recently.  SpO2 while walking in ER was 88%.  CXR showed Rt lung consolidation that wasn't seen on chest imaging from 11/07/22, and she was started on ABx.  CT chest showed mild centrilobular emphysema, RUL consolidation with thick walled cavitation concerning for cavitary pneumonia.  PCCM consulted to assess further.  Pertinent  Medical History  CKD 3a, HTN, HFpEF, DM type 2, Hypothyroidism, HLD  Significant Hospital Events: Including procedures, antibiotic start and stop dates in addition to other pertinent events   6/13 Admit, start Abx  Interim History / Subjective:  She had her teeth pulled in March.  Has trouble swallowing ever since.  She has been getting episodes of coughing while eating.  Cough up blood a few days ago.  Chest pain better.  Not having fever.  No hx of smoking.  No exposure to TB.  Objective   Blood pressure 111/65, pulse 81, temperature 98.8 F (37.1 C), temperature source Oral, resp. rate 20, height 5\' 5"  (1.651 m), weight 71.3 kg, SpO2 100 %.        Intake/Output Summary (Last 24 hours) at 11/10/2022 1003 Last data filed at 11/10/2022 0435 Gross per 24 hour  Intake 742.1 ml  Output --  Net 742.1 ml   Filed Weights   11/10/22 0435  Weight: 71.3 kg    Examination:  General - alert Eyes - pupils reactive ENT - no sinus tenderness, no stridor, edentulous Cardiac - regular rate/rhythm, no murmur Chest - bronchial breath sounds Rt upper lung region Abdomen - soft, non tender, + bowel sounds Extremities - no cyanosis, clubbing, or edema Skin - no rashes Neuro - normal strength, moves extremities, follows commands Psych - normal mood and  behavior Lymphatics - no lymphadenopathy  Assessment & Plan:   Rt upper lobe cavitary pneumonia likely from aspiration pneumonitis. - she will need at least 4 weeks of Abx >> can use augmentin when ready to transition to oral ABx - will need follow up imaging in 4 to 6 weeks as an outpt - need speech assessment of swallowing  Obstructive sleep apnea. - CPAP qhs  Will arrange for pulmonary follow up in 4 to 6 weeks as an outpt.  Please call if additional assistance needed while she is in hospital.  D/w Dr. Pauletta Browns.  Labs   CBC: Recent Labs  Lab 11/07/22 2000 11/09/22 1625 11/10/22 0430  WBC 9.3 10.9* 7.6  NEUTROABS  --  9.5* 7.1  HGB 9.5* 10.6* 10.2*  HCT 31.8* 34.5* 33.5*  MCV 83.0 80.2 81.7  PLT 252 289 267    Basic Metabolic Panel: Recent Labs  Lab 11/07/22 2000 11/09/22 1625 11/10/22 0430  NA 131* 134* 135  K 4.9 5.0 5.3*  CL 106 108 108  CO2 13* 14* 16*  GLUCOSE 82 112* 147*  BUN 47* 47* 44*  CREATININE 2.05* 1.69* 1.62*  CALCIUM 9.4 9.9 9.4   GFR: Estimated Creatinine Clearance: 31.1 mL/min (A) (by C-G formula based on SCr of 1.62 mg/dL (H)). Recent Labs  Lab 11/07/22 2000 11/09/22 1625 11/09/22 1800 11/09/22 1901 11/10/22 0430  PROCALCITON  --   --  3.87  --   --  WBC 9.3 10.9*  --   --  7.6  LATICACIDVEN  --   --   --  2.2*  --     Liver Function Tests: Recent Labs  Lab 11/09/22 1625  AST 20  ALT 10  ALKPHOS 37*  BILITOT 0.6  PROT 8.9*  ALBUMIN 2.5*   No results for input(s): "LIPASE", "AMYLASE" in the last 168 hours. No results for input(s): "AMMONIA" in the last 168 hours.  ABG No results found for: "PHART", "PCO2ART", "PO2ART", "HCO3", "TCO2", "ACIDBASEDEF", "O2SAT"   Coagulation Profile: No results for input(s): "INR", "PROTIME" in the last 168 hours.  Cardiac Enzymes: No results for input(s): "CKTOTAL", "CKMB", "CKMBINDEX", "TROPONINI" in the last 168 hours.  HbA1C: Hgb A1c MFr Bld  Date/Time Value Ref Range Status   11/09/2022 04:25 PM 4.9 4.8 - 5.6 % Final    Comment:    (NOTE) Pre diabetes:          5.7%-6.4%  Diabetes:              >6.4%  Glycemic control for   <7.0% adults with diabetes   06/01/2022 01:27 PM 5.6 4.8 - 5.6 % Final    Comment:    (NOTE)         Prediabetes: 5.7 - 6.4         Diabetes: >6.4         Glycemic control for adults with diabetes: <7.0     CBG: Recent Labs  Lab 11/09/22 2025 11/10/22 0657 11/10/22 0814  GLUCAP 124* 148* 140*    Review of Systems:   Reviewed and negative  Past Medical History:  She,  has a past medical history of CHF (congestive heart failure) (HCC), Hypertension, Hypothyroidism (03/29/2017), Obesity (BMI 30-39.9), and Premature ventricular contractions.   Surgical History:   Past Surgical History:  Procedure Laterality Date   ABDOMINAL HYSTERECTOMY     CHOLECYSTECTOMY     COLONOSCOPY WITH PROPOFOL N/A 12/29/2021   Procedure: COLONOSCOPY WITH PROPOFOL;  Surgeon: Kathi Der, MD;  Location: [facility] ENDOSCOPY;  Service: Gastroenterology;  Laterality: N/A;   ENDOVENOUS ABLATION SAPHENOUS VEIN W/ LASER Right 01/26/2022   endovenous laser ablation right greater saphenous vein by Cari Caraway MD   TONSILLECTOMY     TUBAL LIGATION       Social History:   reports that she has never smoked. She has never used smokeless tobacco. She reports that she does not drink alcohol and does not use drugs.   Family History:  Her family history includes Diabetes in her mother and sister; Heart attack in her sister; Heart failure in her mother; Kidney failure in her mother and sister. There is no history of Breast cancer.   Allergies No Known Allergies   Home Medications  Prior to Admission medications   Medication Sig Start Date End Date Taking? Authorizing Provider  acetaminophen (TYLENOL) 500 MG tablet Take 1,000 mg by mouth as needed (pain).   Yes [provider]  amLODipine (NORVASC) 10 MG tablet Take 10 mg by mouth daily. 02/04/20   Yes [provider]  aspirin EC 81 MG EC tablet Take 1 tablet (81 mg total) by mouth daily. 07/26/13  Yes Leatha Gilding, MD  dapagliflozin propanediol (FARXIGA) 10 MG TABS tablet Take 10 mg by mouth daily.   Yes [provider]  furosemide (LASIX) 40 MG tablet Take 1 tablet (40 mg total) by mouth 2 (two) times daily. 06/04/22  Yes Nooruddin, Jason Fila, MD  hydrALAZINE (APRESOLINE) 50  MG tablet Take 50 mg by mouth daily.   Yes [provider]  KERENDIA 10 MG TABS take 1 tablet by mouth daily for chronic kidney disease   Yes [provider]  levothyroxine (SYNTHROID, LEVOTHROID) 25 MCG tablet Take 12.5 mcg by mouth daily. 02/05/15  Yes [provider]  megestrol (MEGACE) 40 MG tablet Take 1 tablet by mouth 2 (two) times daily. 10/28/22  Yes [provider]  metoprolol succinate (TOPROL-XL) 50 MG 24 hr tablet Take 50 mg by mouth daily. Take with or immediately following a meal.   Yes [provider]  Multiple Vitamins-Minerals (MULTIVITAMIN WITH MINERALS) tablet Take 2 tablets by mouth daily.   Yes [provider]  potassium chloride SA (KLOR-CON M) 20 MEQ tablet Take 20 mEq by mouth daily.   Yes [provider]  rosuvastatin (CRESTOR) 5 MG tablet Take 5 mg by mouth daily. 05/07/21  Yes [provider]     Signature:  Coralyn Helling, MD [place] [facility] Pager - [phone] or [phone] 11/10/2022, 10:52 AM

## 2022-11-10 NOTE — Progress Notes (Signed)
PROGRESS NOTE    Michaela Baker  ZOX:[id number] DOB: December 31, 1950 DOA: 11/09/2022 PCP: Diamantina Providence, FNP   Brief Narrative:  72 y.o. female with medical history significant of CKD stage IIIa, hypertension, HFpEF, type 2 diabetes (diet-controlled), hypothyroidism presented with right-sided chest pain along with cough, poor appetite and recent weight loss.  On presentation, chest x-ray showed right lower lobe consolidative opacity..  She was started on IV antibiotics.  CT chest without contrast showed right upper lobe airspace consolidation with area of thick walled cavitation.  Assessment & Plan:   Right upper lobe cavitary pneumonia likely from aspiration pneumonitis -Currently on Unasyn.  I have consulted pulmonary.  Follow recommendations. -SLP evaluation.  Aspiration precautions -Currently on room air.  Acute kidney injury on CKD stage IIIb Acute metabolic acidosis -Baseline creatinine 1.3-1.4.  Creatinine 1.69 on presentation.  Creatinine 1.62 today.  On IV fluids.  Repeat a.m. labs. -Bicarb 16 today.  Start bicarb drip  Acute metabolic encephalopathy -Possibly from above.  Unclear if the patient has underlying undiagnosed dementia. -Still slow to respond.  Monitor mental status.  Fall precautions. -TSH less than 0.010; T4 slightly elevated at 1.52.  Need to be repeated in the next few weeks.  Check B12 and ammonia levels in AM.  Start empiric folic acid supplementation.  Chronic diastolic heart failure -Currently compensated.  Strict input and output.  Daily weights.  Continue metoprolol and hydralazine.  Diuretics on hold.  Outpatient follow-up with cardiology.  Hyperkalemia -Mild.  Repeat a.m. labs  Hyponatremia -improved  Hypertension -Blood pressure on the lower side.  Continue metoprolol, hydralazine and amlodipine  Diabetes mellitus type 2 -Well-controlled with [place].  Farxiga on hold.  Blood sugars currently stable  Hyperlipidemia Continue statin  Anemia  of chronic disease -From chronic illnesses.  Hemoglobin stable.  Monitor intermittently  Abnormal weight loss Poor oral intake/failure to thrive -Unclear cause.  Colonoscopy and mammogram completed within the last year.  Has been referred to GI as an outpatient. -Outpatient follow-up with PCP and gastroenterology -Nutrition consult  Generalized weakness -PT/OT eval  OSA -Patient apparently does not use CPAP at home.  Outpatient follow-up with PCP  DVT prophylaxis: Lovenox Code Status: Full Family Communication: None at bedside Disposition Plan: Status is: Inpatient Remains inpatient appropriate because: Of severity of illness  Consultants: Pulmonary  Procedures: None  Antimicrobials: Unasyn   Subjective: Patient seen and examined at bedside.  Slow to respond but answers some questions appropriately.  Still short of breath with exertion.  Oral intake is poor.  No fever, vomiting, chest pain reported.  Objective: Vitals:   11/10/22 0435 11/10/22 0730 11/10/22 1008 11/10/22 1230  BP:  111/65  (!) 101/48  Pulse:  81 90 83  Resp:  20  20  Temp:  98.8 F (37.1 C)  (!) 97.5 F (36.4 C)  TempSrc:  Oral  Oral  SpO2:  100%  98%  Weight: 71.3 kg     Height:        Intake/Output Summary (Last 24 hours) at 11/10/2022 1302 Last data filed at 11/10/2022 1140 Gross per 24 hour  Intake 742.1 ml  Output 300 ml  Net 442.1 ml   Filed Weights   11/10/22 0435  Weight: 71.3 kg    Examination:  General exam: Appears calm and comfortable.  Looks chronically ill and deconditioned.  On room air. Respiratory system: Bilateral decreased breath sounds at bases Cardiovascular system: S1 & S2 heard, Rate controlled Gastrointestinal system: Abdomen is nondistended, soft and  nontender. Normal bowel sounds heard. Extremities: No cyanosis, clubbing, edema  Central nervous system: Alert and awake.  Slow to respond.  Poor historian.  No focal neurological deficits. Moving extremities Skin:  No rashes, lesions or ulcers Psychiatry: Flat affect.  Not agitated.   Data Reviewed: I have personally reviewed following labs and imaging studies  CBC: Recent Labs  Lab 11/07/22 2000 11/09/22 1625 11/10/22 0430  WBC 9.3 10.9* 7.6  NEUTROABS  --  9.5* 7.1  HGB 9.5* 10.6* 10.2*  HCT 31.8* 34.5* 33.5*  MCV 83.0 80.2 81.7  PLT 252 289 267   Basic Metabolic Panel: Recent Labs  Lab 11/07/22 2000 11/09/22 1625 11/10/22 0430  NA 131* 134* 135  K 4.9 5.0 5.3*  CL 106 108 108  CO2 13* 14* 16*  GLUCOSE 82 112* 147*  BUN 47* 47* 44*  CREATININE 2.05* 1.69* 1.62*  CALCIUM 9.4 9.9 9.4   GFR: Estimated Creatinine Clearance: 31.1 mL/min (A) (by C-G formula based on SCr of 1.62 mg/dL (H)). Liver Function Tests: Recent Labs  Lab 11/09/22 1625  AST 20  ALT 10  ALKPHOS 37*  BILITOT 0.6  PROT 8.9*  ALBUMIN 2.5*   No results for input(s): "LIPASE", "AMYLASE" in the last 168 hours. No results for input(s): "AMMONIA" in the last 168 hours. Coagulation Profile: No results for input(s): "INR", "PROTIME" in the last 168 hours. Cardiac Enzymes: No results for input(s): "CKTOTAL", "CKMB", "CKMBINDEX", "TROPONINI" in the last 168 hours. BNP (last 3 results) No results for input(s): "PROBNP" in the last 8760 hours. HbA1C: Recent Labs    11/09/22 1625  HGBA1C 4.9   CBG: Recent Labs  Lab 11/09/22 2025 11/10/22 0657 11/10/22 0814 11/10/22 1134  GLUCAP 124* 148* 140* 128*   Lipid Profile: No results for input(s): "CHOL", "HDL", "LDLCALC", "TRIG", "CHOLHDL", "LDLDIRECT" in the last 72 hours. Thyroid Function Tests: Recent Labs    11/09/22 1907  TSH <0.010*  FREET4 1.52*   Anemia Panel: No results for input(s): "VITAMINB12", "FOLATE", "FERRITIN", "TIBC", "IRON", "RETICCTPCT" in the last 72 hours. Sepsis Labs: Recent Labs  Lab 11/09/22 1800 11/09/22 1901  PROCALCITON 3.87  --   LATICACIDVEN  --  2.2*    Recent Results (from the past 240 hour(s))  MRSA Next Gen  by PCR, Nasal     Status: None   Collection Time: 11/09/22  3:22 AM   Specimen: Nasal Mucosa; Nasal Swab  Result Value Ref Range Status   MRSA by PCR Next Gen NOT DETECTED NOT DETECTED Final    Comment: (NOTE) The GeneXpert MRSA Assay (FDA approved for NASAL specimens only), is one component of a comprehensive MRSA colonization surveillance program. It is not intended to diagnose MRSA infection nor to guide or monitor treatment for MRSA infections. Test performance is not FDA approved in patients less than 1 years old. Performed at [facility], [address], [place], [place] [address]   Blood culture (routine x 2)     Status: None (Preliminary result)   Collection Time: 11/09/22  6:00 PM   Specimen: BLOOD RIGHT ARM  Result Value Ref Range Status   Specimen Description   Final    BLOOD RIGHT ARM Performed at [facility], [address], [place], [place] [address]    Special Requests   Final    BOTTLES DRAWN AEROBIC AND ANAEROBIC Blood Culture adequate volume Performed at [facility], [address] [place], [place] [address]    Culture  Final    NO GROWTH < 12 HOURS Performed at [facility], [address] [place], [place] [address]    Report Status PENDING  Incomplete  Blood culture (routine x 2)     Status: None (Preliminary result)   Collection Time: 11/09/22  6:10 PM   Specimen: BLOOD RIGHT ARM  Result Value Ref Range Status   Specimen Description   Final    BLOOD RIGHT ARM Performed at [facility], [address] [place], [place] [address]    Special Requests   Final    BOTTLES DRAWN AEROBIC AND ANAEROBIC Blood Culture adequate volume Performed at [facility], [address], [place], [place] [address]    Culture   Final    NO GROWTH < 12 HOURS Performed at [facility], [address], [place], [place] [address]    Report Status PENDING  Incomplete         Radiology  Studies: CT CHEST WO CONTRAST  Result Date: 11/09/2022 CLINICAL DATA:  Chest pain, hemoptysis. Respiratory illness, nondiagnostic xray EXAM: CT CHEST WITHOUT CONTRAST TECHNIQUE: Multidetector CT imaging of the chest was performed following the standard protocol without IV contrast. RADIATION DOSE REDUCTION: This exam was performed according to the departmental dose-optimization program which includes automated exposure control, adjustment of the mA and/or kV according to patient size and/or use of iterative reconstruction technique. COMPARISON:  Chest x-ray today and 11/07/2022 FINDINGS: Cardiovascular: Heart is normal size. Aorta is normal caliber. Extensive coronary artery calcifications and aortic calcifications. Mediastinum/Nodes: No mediastinal, hilar, or axillary adenopathy. Trachea and esophagus are unremarkable. Right thyroid nodule measures 1.8 cm. Lungs/Pleura: Mild centrilobular emphysema. Consolidation noted in the right upper lobe. Area of thick walled cavitation noted in the anterior right upper lobe. This appears new from chest x-ray 2 days ago and is therefore most compatible cavitary pneumonia. Platelike scarring in the superior segment of the left lower lobe and bibasilar atelectasis or scarring. No effusions. Upper Abdomen: No acute findings Musculoskeletal: Chest wall soft tissues are unremarkable. No acute bony abnormality. IMPRESSION: Airspace consolidation in the right upper lobe with area of thick walled cavitation. Given the rapid onset, this is most concerning for cavitary pneumonia. Recommend follow-up to resolution. Coronary artery disease. Aortic Atherosclerosis (ICD10-I70.0) and Emphysema (ICD10-J43.9). Electronically Signed   By: Charlett Nose M.D.   On: 11/09/2022 19:06   DG Chest Portable 1 View  Result Date: 11/09/2022 CLINICAL DATA:  Chest pain EXAM: PORTABLE CHEST 1 VIEW COMPARISON:  X-ray 11/07/2022 and older FINDINGS: Development of a area of consolidative opacity in the  right mid to lower lung. This could be lower lobe. Mild linear opacity left lung base likely scar or atelectasis. No pneumothorax, effusion or edema. Normal cardiopericardial silhouette. Calcified aorta. Overlapping cardiac leads. Degenerative changes seen along the spine and shoulders. IMPRESSION: Consolidative opacity seen along the right lower lobe new from previous. Possible acute infiltrate recommend follow-up. Mild left basilar atelectasis. Electronically Signed   By: Karen Kays M.D.   On: 11/09/2022 16:24        Scheduled Meds:  amLODipine  10 mg Oral Daily   aspirin EC  81 mg Oral Daily   enoxaparin (LOVENOX) injection  40 mg Subcutaneous QHS   feeding supplement  237 mL Oral Q24H   hydrALAZINE  50 mg Oral Daily   metoprolol succinate  50 mg Oral Daily   rosuvastatin  5 mg Oral Daily   senna-docusate  1 tablet Oral  BID   Continuous Infusions:  ampicillin-sulbactam (UNASYN) IV 3 g (11/10/22 1028)   lactated ringers 75 mL/hr at 11/09/22 2038          Glade Lloyd, MD [facility] 11/10/2022, 1:02 PM

## 2022-11-10 NOTE — Progress Notes (Signed)
Mobility Specialist - Progress Note   11/10/22 1349  Mobility  Activity Ambulated with assistance in hallway  Level of Assistance Standby assist, set-up cues, supervision of patient - no hands on  Assistive Device Front wheel walker  Distance Ambulated (ft) 80 ft  Activity Response Tolerated well  Mobility Referral Yes  $Mobility charge 1 Mobility  Mobility Specialist Start Time (ACUTE ONLY) 0132  Mobility Specialist Stop Time (ACUTE ONLY) 0149  Mobility Specialist Time Calculation (min) (ACUTE ONLY) 17 min   Pt received in recliner and agreeable to mobility after some motivation. When returning to room, pt desat to 88%. Encouraged pursed lip breaths allowing O2 to come back up to 95% on RA. No complaints during session. Pt to recliner after session with all needs met. Chair alarm on.   [facility]

## 2022-11-10 NOTE — Evaluation (Signed)
Occupational Therapy Evaluation Patient Details Name: Michaela Baker MRN: [id number] DOB: [birth year] Today's Date: 11/10/2022   History of Present Illness 72 y.o. female who presents to the ED due to chest pain. PMH: CKD stage IIIa, hypertension, HFpEF, type 2 diabetes (diet-controlled), hypothyroidism   Clinical Impression   Patient is currently requiring assistance with ADLs including up to moderate assist with Lower body ADLs, setup assist with Upper body ADLs,  as well as  minimal assist with bed mobility and minimal assist with functional transfers to BSC/recliner.  Current level of function is a little below patient's typical baseline.  During this evaluation, patient was limited by generalized weakness, impaired activity tolerance, and continued dysarthria from tongue swelling although pt reports much improved, all of which has the potential to impact patient's safety and independence during functional mobility, as well as performance for ADLs.  Patient lives alone but with family nearby,  who are possibly able to provide 24/7 supervision and assistance and already sleep over and stay with pt at night as well as assist her out of bed in the morning.  Patient demonstrates good rehab potential, and should benefit from continued skilled occupational therapy services while in acute care to maximize safety, independence and quality of life at home.  Continued occupational therapy services are recommended.  ?       Recommendations for follow up therapy are one component of a multi-disciplinary discharge planning process, led by the attending physician.  Recommendations may be updated based on patient status, additional functional criteria and insurance authorization.   Assistance Recommended at Discharge    Patient can return home with the following A little help with walking and/or transfers;A little help with bathing/dressing/bathroom;Assist for transportation;Help with stairs or ramp for  entrance;Assistance with cooking/housework;Direct supervision/assist for medications management;Direct supervision/assist for financial management    Functional Status Assessment  Patient has had a recent decline in their functional status and demonstrates the ability to make significant improvements in function in a reasonable and predictable amount of time.  Equipment Recommendations  None recommended by OT    Recommendations for Other Services       Precautions / Restrictions Precautions Precautions: Fall Restrictions Weight Bearing Restrictions: No      Mobility Bed Mobility Overal bed mobility: Needs Assistance Bed Mobility: Supine to Sit     Supine to sit: Min assist, HOB elevated          Transfers                          Balance Overall balance assessment: Needs assistance Sitting-balance support: Feet supported Sitting balance-Leahy Scale: Good     Standing balance support: Reliant on assistive device for balance, During functional activity, Bilateral upper extremity supported Standing balance-Leahy Scale: Poor                             ADL either performed or assessed with clinical judgement   ADL Overall ADL's : Needs assistance/impaired Eating/Feeding: Supervision/ safety   Grooming: Wash/dry hands;Set up;Sitting   Upper Body Bathing: Supervision/ safety;Sitting;Set up   Lower Body Bathing: Minimal assistance;Sitting/lateral leans;Sit to/from stand   Upper Body Dressing : Set up;Sitting   Lower Body Dressing: Moderate assistance;Sitting/lateral leans   Toilet Transfer: Minimal assistance;Rolling walker (2 wheels);Ambulation;Cueing for safety Toilet Transfer Details (indicate cue type and reason): Pt stood from EOB to RW with Min As and ambulated around bed  to recliner. Poor eccentric control requiring Min As for decent and cues for UE reach back. Toileting- Clothing Manipulation and Hygiene: Minimal assistance;Sit  to/from stand;Sitting/lateral lean Toileting - Clothing Manipulation Details (indicate cue type and reason): On pure wick. Denied need to void. Anticipate Min As.     Functional mobility during ADLs: Minimal assistance;Min guard;Rolling walker (2 wheels);Cueing for safety       Vision   Vision Assessment?: No apparent visual deficits Additional Comments: Pt denies acute changes or impairements     Perception     Praxis      Pertinent Vitals/Pain Pain Assessment Pain Assessment: No/denies pain     Hand Dominance Right   Extremity/Trunk Assessment Upper Extremity Assessment Upper Extremity Assessment: LUE deficits/detail;Generalized weakness LUE Deficits / Details: 3+/5 shoulder and grip   Lower Extremity Assessment Lower Extremity Assessment: Defer to PT evaluation       Communication Communication Communication: HOH   Cognition Arousal/Alertness: Awake/alert Behavior During Therapy: WFL for tasks assessed/performed Overall Cognitive Status: Within Functional Limits for tasks assessed                                       General Comments       Exercises     Shoulder Instructions      Home Living Family/patient expects to be discharged to:: Private residence Living Arrangements: Children;Alone (Family stay the night with pt and is tehre frequently during the day. Available 24/7, but typically in and out.) Available Help at Discharge: Available 24 hours/day Type of Home: Apartment Home Access: Level entry     Home Layout: One level     Bathroom Shower/Tub: Chief Strategy Officer: Handicapped height     Home Equipment: Agricultural consultant (2 wheels);Cane - quad;Shower seat;BSC/3in1;Grab bars - toilet;Grab bars - tub/shower          Prior Functioning/Environment Prior Level of Function : Needs assist       Physical Assist : ADLs (physical)   ADLs (physical): IADLs;Dressing;Bathing   ADLs Comments: Pt receives  intermittent BADL assistance from family depending on how she feels any given day. Pt also has family with her as she steps over the tub using a clamp on tub bar. Family performs all IADLs and now provides transportation as pt is not currently driving. Pt manages medication for pt.        OT Problem List: Impaired balance (sitting and/or standing);Decreased activity tolerance;Decreased strength;Cardiopulmonary status limiting activity      OT Treatment/Interventions: Self-care/ADL training;Therapeutic activities;Therapeutic exercise;Energy conservation;Patient/family education;Balance training;DME and/or AE instruction    OT Goals(Current goals can be found in the care plan section) Acute Rehab OT Goals Patient Stated Goal: Gain strength OT Goal Formulation: With patient Time For Goal Achievement: 11/24/22 Potential to Achieve Goals: Good ADL Goals Pt Will Perform Grooming: standing;with supervision (Tolerating 3/3 tasks with VSS) Pt Will Transfer to Toilet: with modified independence;ambulating (Comfort height toilet) Pt Will Perform Toileting - Clothing Manipulation and hygiene: with modified independence;sitting/lateral leans;sit to/from stand (AE training if needed) Pt/caregiver will Perform Home Exercise Program: Increased strength;Both right and left upper extremity;With Supervision Additional ADL Goal #1: Patient will identify at least 3 energy conservation strategies to employ at home in order to maximize function and quality of life and decrease caregiver burden while preventing exacerbation of symptoms and rehospitalization.  OT Frequency: Min 1X/week    Co-evaluation  AM-PAC OT "6 Clicks" Daily Activity     Outcome Measure Help from another person eating meals?: None Help from another person taking care of personal grooming?: A Little Help from another person toileting, which includes using toliet, bedpan, or urinal?: A Little Help from another person bathing  (including washing, rinsing, drying)?: A Little Help from another person to put on and taking off regular upper body clothing?: A Little Help from another person to put on and taking off regular lower body clothing?: A Lot 6 Click Score: 18   End of Session Equipment Utilized During Treatment: Gait belt;Rolling walker (2 wheels) Nurse Communication: Mobility status  Activity Tolerance: Patient tolerated treatment well Patient left: in chair;with call bell/phone within reach;with chair alarm set  OT Visit Diagnosis: Unsteadiness on feet (R26.81);Muscle weakness (generalized) (M62.81)                Time: 1610-9604 OT Time Calculation (min): 25 min Charges:  OT General Charges $OT Visit: 1 Visit OT Evaluation $OT Eval Low Complexity: 1 Low OT Treatments $Self Care/Home Management : 8-22 mins  Victorino Dike, OT Acute Rehab Services Office: [phone] 11/10/2022  Theodoro Clock 11/10/2022, 10:19 AM

## 2022-11-10 NOTE — Evaluation (Signed)
Clinical/Bedside Swallow Evaluation Patient Details  Name: Michaela Baker MRN: [id number] Date of Birth: 05-31-1950  Today's Date: 11/10/2022 Time: SLP Start Time (ACUTE ONLY): 1215 SLP Stop Time (ACUTE ONLY): 1255 SLP Time Calculation (min) (ACUTE ONLY): 40 min  Past Medical History:  Past Medical History:  Diagnosis Date   CHF (congestive heart failure) (HCC)    Hypertension    Hypothyroidism 03/29/2017   Obesity (BMI 30-39.9)    Premature ventricular contractions    Past Surgical History:  Past Surgical History:  Procedure Laterality Date   ABDOMINAL HYSTERECTOMY     CHOLECYSTECTOMY     COLONOSCOPY WITH PROPOFOL N/A 12/29/2021   Procedure: COLONOSCOPY WITH PROPOFOL;  Surgeon: Kathi Der, MD;  Location: [facility];  Service: Gastroenterology;  Laterality: N/A;   ENDOVENOUS ABLATION SAPHENOUS VEIN W/ LASER Right 01/26/2022   endovenous laser ablation right greater saphenous vein by Cari Caraway MD   TONSILLECTOMY     TUBAL LIGATION     HPI:  72 yo female adm with lingual edema, cough and expectoration of bloody secretions.  She was found to have cavitary lesion in her right lung = concerning for cavitation.  She has PMH + for DM *she denies*, CHF, prior smoker 2 PPD for 17 years (quit 1996), generalized weakness and weight loss.  Daughter reports she can get pt to take a few small bites or sips and then she will stop intake.  Admits to significant gustatory changes - saying nothing tastes good.  Left facial asymmetry is baseline per pt and daughter *and prior picture on daughter's cell phone.    Assessment / Plan / Recommendation  Clinical Impression  [name] greeted with her sitting up with chair. She passed 3 ounce Yale water challenge and is observed to have occasional cough/throat clear with liquids.  Nebulous description of dysphagia including difficulty "getting things down" but also severe gustatory changes with pt stating "nothing tastes good".   In addition, pt  with recent reflux episode approx one week ago - that she states was not bad but her 45 year-old great grandson was concerned at that time - causing SLP to question if worse than pt portrays. Her picture of her dysphagia is not clear and she now reports she is uncertain if she actually coughed up the Pedialyte that she had consumed or if it was blood in the tissue PTA. In addition, pt reports remote h/o smoking 2 PPD for 17 years and stopped smoking in 1996.  Recommend pt undergo esophagram for instrumental evaluation but pt states she has not had a BM in one WEEK.  She denies abdomen discomfort.  Recommend continue diet as tolerated pending esophagram. SLP will follow up x1 to assess for indication for SLP intervention.  Pt and daughter informed to MD orders as well as RNs.  Daughter reports pt was to see GI today for an appointment. SLP Visit Diagnosis: Dysphagia, unspecified (R13.10)    Aspiration Risk  Mild aspiration risk    Diet Recommendation Thin liquid   Liquid Administration via: Cup;Straw Medication Administration: Whole meds with liquid Supervision: Patient able to self feed Compensations: Slow rate;Small sips/bites Postural Changes: Seated upright at 90 degrees;Remain upright for at least 30 minutes after po intake    Other  Recommendations Recommended Consults: Consider esophageal assessment Oral Care Recommendations: Oral care BID    Recommendations for follow up therapy are one component of a multi-disciplinary discharge planning process, led by the attending physician.  Recommendations may be updated based on  patient status, additional functional criteria and insurance authorization.  Follow up Recommendations    N/a    Assistance Recommended at Discharge  TBD  Functional Status Assessment Patient has had a recent decline in their functional status and demonstrates the ability to make significant improvements in function in a reasonable and predictable amount of time.   Frequency and Duration min 1 x/week  1 week       Prognosis Prognosis for improved oropharyngeal function: Fair Barriers to Reach Goals: Time post onset      Swallow Study   General Date of Onset: 11/10/22 (poor po for months) HPI: 72 yo female adm with lingual edema, cough and expectoration of bloody secretions.  She was found to have cavitary lesion in her right lung = concerning for cavitation.  She has PMH + for DM *she denies*, CHF, prior smoker 2 PPD for 17 years (quit 1996), generalized weakness and weight loss.  Daughter reports she can get pt to take a few small bites or sips and then she will stop intake.  Admits to significant gustatory changes - saying nothing tastes good.  Left facial asymmetry is baseline per pt and daughter *and prior picture on daughter's cell phone. Type of Study: Bedside Swallow Evaluation Previous Swallow Assessment: none in system Diet Prior to this Study: Dysphagia 3 (mechanical soft);Thin liquids (Level 0) Temperature Spikes Noted: No Respiratory Status: Room air History of Recent Intubation: No Behavior/Cognition: Alert;Cooperative;Pleasant mood Oral Cavity Assessment: Within Functional Limits Oral Care Completed by SLP: No Oral Cavity - Dentition: Edentulous Self-Feeding Abilities: Able to feed self Patient Positioning: Upright in bed Baseline Vocal Quality: Low vocal intensity Volitional Cough: Weak Volitional Swallow: Unable to elicit    Oral/Motor/Sensory Function Overall Oral Motor/Sensory Function: Mild impairment Facial ROM: Reduced left Facial Symmetry: Abnormal symmetry left Facial Strength: Within Functional Limits Facial Sensation: Within Functional Limits Lingual ROM: Within Functional Limits Lingual Symmetry: Within Functional Limits Lingual Strength: Within Functional Limits Lingual Sensation: Within Functional Limits Velum: Within Functional Limits (appears with minimal contusions) Mandible: Within Functional Limits    Ice Chips Ice chips: Not tested   Thin Liquid Thin Liquid: Within functional limits Presentation: Self Fed;Spoon Oral Phase Functional Implications:  (clinically slight delay in swallow) Pharyngeal  Phase Impairments: Throat Clearing - Immediate;Cough - Delayed    Nectar Thick Nectar Thick Liquid: Within functional limits Presentation: Straw;Self Fed Other Comments: minimal delay in swallow, may be compensatory   Honey Thick Honey Thick Liquid: Not tested   Puree Puree: Within functional limits Presentation: Self Fed;Spoon   Solid     Solid:  (slow oral manipulation, pt required extra time - suspect due to both lack of dentition and gustatory changes)      Chales Abrahams 11/10/2022,1:06 PM Rolena Infante, MS [facility] SLP Acute Rehab Services Office [phone]

## 2022-11-11 ENCOUNTER — Inpatient Hospital Stay (HOSPITAL_COMMUNITY): Payer: 59

## 2022-11-11 DIAGNOSIS — J690 Pneumonitis due to inhalation of food and vomit: Secondary | ICD-10-CM | POA: Diagnosis not present

## 2022-11-11 DIAGNOSIS — R634 Abnormal weight loss: Secondary | ICD-10-CM | POA: Diagnosis not present

## 2022-11-11 DIAGNOSIS — E872 Acidosis, unspecified: Secondary | ICD-10-CM | POA: Diagnosis not present

## 2022-11-11 LAB — CBC WITH DIFFERENTIAL/PLATELET
Abs Immature Granulocytes: 0.06 10*3/uL (ref 0.00–0.07)
Basophils Absolute: 0 10*3/uL (ref 0.0–0.1)
Basophils Relative: 0 %
Eosinophils Absolute: 0 10*3/uL (ref 0.0–0.5)
Eosinophils Relative: 0 %
HCT: 27.1 % — ABNORMAL LOW (ref 36.0–46.0)
Hemoglobin: 8.3 g/dL — ABNORMAL LOW (ref 12.0–15.0)
Immature Granulocytes: 1 %
Lymphocytes Relative: 6 %
Lymphs Abs: 0.5 10*3/uL — ABNORMAL LOW (ref 0.7–4.0)
MCH: 24.4 pg — ABNORMAL LOW (ref 26.0–34.0)
MCHC: 30.6 g/dL (ref 30.0–36.0)
MCV: 79.7 fL — ABNORMAL LOW (ref 80.0–100.0)
Monocytes Absolute: 0.6 10*3/uL (ref 0.1–1.0)
Monocytes Relative: 8 %
Neutro Abs: 6.2 10*3/uL (ref 1.7–7.7)
Neutrophils Relative %: 85 %
Platelets: 251 10*3/uL (ref 150–400)
RBC: 3.4 MIL/uL — ABNORMAL LOW (ref 3.87–5.11)
RDW: 17.6 % — ABNORMAL HIGH (ref 11.5–15.5)
WBC: 7.4 10*3/uL (ref 4.0–10.5)
nRBC: 0 % (ref 0.0–0.2)

## 2022-11-11 LAB — BASIC METABOLIC PANEL
Anion gap: 8 (ref 5–15)
BUN: 45 mg/dL — ABNORMAL HIGH (ref 8–23)
CO2: 20 mmol/L — ABNORMAL LOW (ref 22–32)
Calcium: 9.1 mg/dL (ref 8.9–10.3)
Chloride: 109 mmol/L (ref 98–111)
Creatinine, Ser: 1.24 mg/dL — ABNORMAL HIGH (ref 0.44–1.00)
GFR, Estimated: 46 mL/min — ABNORMAL LOW (ref >60)
Glucose, Bld: 140 mg/dL — ABNORMAL HIGH (ref 70–99)
Potassium: 4.2 mmol/L (ref 3.5–5.1)
Sodium: 137 mmol/L (ref 135–145)

## 2022-11-11 LAB — URINE CULTURE: Culture: <10000 — AB

## 2022-11-11 LAB — MAGNESIUM: Magnesium: 1.8 mg/dL (ref 1.7–2.4)

## 2022-11-11 LAB — AMMONIA: Ammonia: 19 umol/L (ref 9–35)

## 2022-11-11 LAB — GLUCOSE, CAPILLARY
Glucose-Capillary: 122 mg/dL — ABNORMAL HIGH (ref 70–99)
Glucose-Capillary: 114 mg/dL — ABNORMAL HIGH (ref 70–99)
Glucose-Capillary: 88 mg/dL (ref 70–99)

## 2022-11-11 LAB — VITAMIN B12: Vitamin B-12: 480 pg/mL (ref 180–914)

## 2022-11-11 NOTE — TOC Progression Note (Addendum)
Transition of Care [facility]) - Progression Note    Patient Details  Name: Michaela Baker MRN: [id number] Date of Birth: 1951/04/25  Transition of [facility]) CM/SW Contact  Howell Rucks, RN Phone Number: 11/11/2022, 9:59 AM  Clinical Narrative:   [facility] consulted for SNF placement, previous PT eval recommendation -HH PT, await updated PT recommendation. TOC will continue to follow.   -11:41am PT- HH PT remains appropriate recommendation. TOC will continue to follow.          Expected Discharge Plan and Services                                               Social Determinants of Health (SDOH) Interventions SDOH Screenings   Food Insecurity: No Food Insecurity (11/10/2022)  Housing: Low Risk  (11/10/2022)  Transportation Needs: No Transportation Needs (11/10/2022)  Utilities: Not At Risk (11/10/2022)  Tobacco Use: Low Risk  (11/09/2022)    Readmission Risk Interventions    11/10/2022   11:07 AM 06/03/2022    4:43 PM  Readmission Risk Prevention Plan  Transportation Screening Complete Complete  PCP or Specialist Appt within 5-7 Days  Complete  PCP or Specialist Appt within 3-5 Days Complete   Home Care Screening  Complete  Medication Review (RN CM)  Complete  HRI or Home Care Consult Complete   Social Work Consult for Recovery Care Planning/Counseling Complete   Palliative Care Screening Not Applicable   Medication Review Oceanographer) Complete

## 2022-11-11 NOTE — Progress Notes (Addendum)
PROGRESS NOTE    Michaela Baker  GEX:[id number] DOB: [birth year] DOA: 11/09/2022 PCP: Diamantina Providence, FNP   Brief Narrative:  72 y.o. female with medical history significant of CKD stage IIIa, hypertension, HFpEF, type 2 diabetes (diet-controlled), hypothyroidism presented with right-sided chest pain along with cough, poor appetite and recent weight loss.  On presentation, chest x-ray showed right lower lobe consolidative opacity..  She was started on IV antibiotics.  CT chest without contrast showed right upper lobe airspace consolidation with area of thick walled cavitation.  Pulmonary was consulted.  Assessment & Plan:   Right upper lobe cavitary pneumonia likely from aspiration pneumonitis -Currently on Unasyn.  Pulmonary recommended at least 4 weeks of antibiotics: Possibly Augmentin on discharge; will need follow-up imaging in 4 to 6 weeks as an outpatient.  Pulmonary signed off on 11/10/2022. -SLP following.  Aspiration precautions -Currently on room air.  Acute kidney injury on CKD stage IIIb Acute metabolic acidosis -Baseline creatinine 1.3-1.4.  Creatinine 1.69 on presentation.  Creatinine 1.24 today.  Currently on bicarb drip.  Bicarbonate 20 today -Monitor.  Acute metabolic encephalopathy -Possibly from above.  Unclear if the patient has underlying undiagnosed dementia. -Still slow to respond.  Monitor mental status.  Fall precautions. -TSH less than 0.010; T4 slightly elevated at 1.52.  Needs to be repeated in the next few weeks.  B12 and ammonia levels normal.  Continue empiric folic acid supplementation.  Possible dysphagia -SLP following.  Esophagram pending.  Chronic diastolic heart failure -Currently compensated.  Strict input and output.  Daily weights.  Continue metoprolol and hydralazine.  Diuretics on hold.  Outpatient follow-up with cardiology.  Hyperkalemia -Improved.  Hyponatremia -improved  Hypertension -Blood pressure on the lower side.  Continue  metoprolol, hydralazine and amlodipine  Diabetes mellitus type 2 -Well-controlled with [place].  Farxiga on hold.  Blood sugars currently stable  Hyperlipidemia -Continue statin  Anemia of chronic disease/microcytic anemia -From chronic illnesses.  Hemoglobin stable.  Monitor intermittently  Abnormal weight loss Poor oral intake/failure to thrive Severe malnutrition -Unclear cause.  Colonoscopy and mammogram completed within the last year.  Has been referred to GI as an outpatient. -Outpatient follow-up with PCP and gastroenterology -Follow nutrition recommendations  Generalized weakness -PT recommends SNF.  TOC consulted.  OSA -Patient apparently does not use CPAP at home.  Outpatient follow-up with PCP  DVT prophylaxis: Lovenox Code Status: Full Family Communication: Spoke to daughter/Shuntel on phone  Disposition Plan: Status is: Inpatient Remains inpatient appropriate because: Of severity of illness.  Possible discharge in 1 to 2 days if clinically improves  Consultants: Pulmonary  Procedures: None  Antimicrobials: Unasyn   Subjective: Patient seen and examined at bedside.  No shortness of breath, chest pain, fever reported.  Still slow to respond and a poor historian.   Objective: Vitals:   11/10/22 1600 11/10/22 2200 11/11/22 0514 11/11/22 0614  BP: (!) 104/58 (!) 97/49  115/60  Pulse: 79 76  72  Resp: 20 17  15   Temp: 98.2 F (36.8 C) 98.6 F (37 C)  99 F (37.2 C)  TempSrc: Oral Oral  Oral  SpO2: 100% 100%  97%  Weight:   72.1 kg   Height:        Intake/Output Summary (Last 24 hours) at 11/11/2022 0738 Last data filed at 11/11/2022 0600 Gross per 24 hour  Intake 1821.65 ml  Output 400 ml  Net 1421.65 ml    Filed Weights   11/10/22 0435 11/11/22 0514  Weight: 71.3 kg 72.1  kg    Examination:  General: Currently on room air.  No acute distress.  Looks chronically ill and deconditioned ENT/neck: No thyromegaly.  JVD is not elevated   respiratory: Decreased breath sounds at bases bilaterally with some crackles; no wheezing  CVS: S1-S2 heard, rate controlled currently Abdominal: Soft, nontender, slightly distended; no organomegaly,  bowel sounds are heard Extremities: Trace lower extremity edema; no cyanosis  CNS: Awake and alert.  Very slow to respond.  Poor historian.  No focal neurologic deficit.  Moves extremities Lymph: No obvious lymphadenopathy Skin: No obvious ecchymosis/lesions  psych: Extremely flat affect.  Currently not agitated. musculoskeletal: No obvious joint swelling/deformity    Data Reviewed: I have personally reviewed following labs and imaging studies  CBC: Recent Labs  Lab 11/07/22 2000 11/09/22 1625 11/10/22 0430 11/11/22 0349  WBC 9.3 10.9* 7.6 7.4  NEUTROABS  --  9.5* 7.1 6.2  HGB 9.5* 10.6* 10.2* 8.3*  HCT 31.8* 34.5* 33.5* 27.1*  MCV 83.0 80.2 81.7 79.7*  PLT 252 289 267 251    Basic Metabolic Panel: Recent Labs  Lab 11/07/22 2000 11/09/22 1625 11/10/22 0430 11/11/22 0349  NA 131* 134* 135 137  K 4.9 5.0 5.3* 4.2  CL 106 108 108 109  CO2 13* 14* 16* 20*  GLUCOSE 82 112* 147* 140*  BUN 47* 47* 44* 45*  CREATININE 2.05* 1.69* 1.62* 1.24*  CALCIUM 9.4 9.9 9.4 9.1  MG  --   --   --  1.8    GFR: Estimated Creatinine Clearance: 40.8 mL/min (A) (by C-G formula based on SCr of 1.24 mg/dL (H)). Liver Function Tests: Recent Labs  Lab 11/09/22 1625  AST 20  ALT 10  ALKPHOS 37*  BILITOT 0.6  PROT 8.9*  ALBUMIN 2.5*    No results for input(s): "LIPASE", "AMYLASE" in the last 168 hours. Recent Labs  Lab 11/11/22 0349  AMMONIA 19   Coagulation Profile: No results for input(s): "INR", "PROTIME" in the last 168 hours. Cardiac Enzymes: No results for input(s): "CKTOTAL", "CKMB", "CKMBINDEX", "TROPONINI" in the last 168 hours. BNP (last 3 results) No results for input(s): "PROBNP" in the last 8760 hours. HbA1C: Recent Labs    11/09/22 1625  HGBA1C 4.9     CBG: Recent Labs  Lab 11/10/22 1134 11/10/22 1634 11/10/22 2128 11/11/22 0524 11/11/22 0726  GLUCAP 128* 158* 149* 122* 114*    Lipid Profile: No results for input(s): "CHOL", "HDL", "LDLCALC", "TRIG", "CHOLHDL", "LDLDIRECT" in the last 72 hours. Thyroid Function Tests: Recent Labs    11/09/22 1907  TSH <0.010*  FREET4 1.52*    Anemia Panel: Recent Labs    11/11/22 0349  VITAMINB12 480   Sepsis Labs: Recent Labs  Lab 11/09/22 1800 11/09/22 1901  PROCALCITON 3.87  --   LATICACIDVEN  --  2.2*     Recent Results (from the past 240 hour(s))  MRSA Next Gen by PCR, Nasal     Status: None   Collection Time: 11/09/22  3:22 AM   Specimen: Nasal Mucosa; Nasal Swab  Result Value Ref Range Status   MRSA by PCR Next Gen NOT DETECTED NOT DETECTED Final    Comment: (NOTE) The GeneXpert MRSA Assay (FDA approved for NASAL specimens only), is one component of a comprehensive MRSA colonization surveillance program. It is not intended to diagnose MRSA infection nor to guide or monitor treatment for MRSA infections. Test performance is not FDA approved in patients less than 102 years old. Performed at [facility],  [address], [place], [place] [address]   Blood culture (routine x 2)     Status: None (Preliminary result)   Collection Time: 11/09/22  6:00 PM   Specimen: BLOOD RIGHT ARM  Result Value Ref Range Status   Specimen Description   Final    BLOOD RIGHT ARM Performed at [facility], [address] [place], [place] [address]    Special Requests   Final    BOTTLES DRAWN AEROBIC AND ANAEROBIC Blood Culture adequate volume Performed at [facility], [address] [place], [place] [address]    Culture   Final    NO GROWTH < 12 HOURS Performed at [facility], [address], [place], [place] [address]    Report Status PENDING  Incomplete  Blood culture (routine x 2)     Status: None (Preliminary result)    Collection Time: 11/09/22  6:10 PM   Specimen: BLOOD RIGHT ARM  Result Value Ref Range Status   Specimen Description   Final    BLOOD RIGHT ARM Performed at [facility], [address] [place], [place] [address]    Special Requests   Final    BOTTLES DRAWN AEROBIC AND ANAEROBIC Blood Culture adequate volume Performed at [facility], [address] [place], [place] [address]    Culture   Final    NO GROWTH < 12 HOURS Performed at [facility], [address] [place], [place] [address]    Report Status PENDING  Incomplete         Radiology Studies: CT CHEST WO CONTRAST  Result Date: 11/09/2022 CLINICAL DATA:  Chest pain, hemoptysis. Respiratory illness, nondiagnostic xray EXAM: CT CHEST WITHOUT CONTRAST TECHNIQUE: Multidetector CT imaging of the chest was performed following the standard protocol without IV contrast. RADIATION DOSE REDUCTION: This exam was performed according to the departmental dose-optimization program which includes automated exposure control, adjustment of the mA and/or kV according to patient size and/or use of iterative reconstruction technique. COMPARISON:  Chest x-ray today and 11/07/2022 FINDINGS: Cardiovascular: Heart is normal size. Aorta is normal caliber. Extensive coronary artery calcifications and aortic calcifications. Mediastinum/Nodes: No mediastinal, hilar, or axillary adenopathy. Trachea and esophagus are unremarkable. Right thyroid nodule measures 1.8 cm. Lungs/Pleura: Mild centrilobular emphysema. Consolidation noted in the right upper lobe. Area of thick walled cavitation noted in the anterior right upper lobe. This appears new from chest x-ray 2 days ago and is therefore most compatible cavitary pneumonia. Platelike scarring in the superior segment of the left lower lobe and bibasilar atelectasis or scarring. No effusions. Upper Abdomen: No acute findings Musculoskeletal: Chest wall soft tissues are unremarkable. No  acute bony abnormality. IMPRESSION: Airspace consolidation in the right upper lobe with area of thick walled cavitation. Given the rapid onset, this is most concerning for cavitary pneumonia. Recommend follow-up to resolution. Coronary artery disease. Aortic Atherosclerosis (ICD10-I70.0) and Emphysema (ICD10-J43.9). Electronically Signed   By: Charlett Nose M.D.   On: 11/09/2022 19:06   DG Chest Portable 1 View  Result Date: 11/09/2022 CLINICAL DATA:  Chest pain EXAM: PORTABLE CHEST 1 VIEW COMPARISON:  X-ray 11/07/2022 and older FINDINGS: Development of a area of consolidative opacity in the right mid to lower lung. This could be lower lobe. Mild linear opacity left lung base likely scar or atelectasis. No pneumothorax, effusion or edema. Normal cardiopericardial silhouette. Calcified aorta. Overlapping cardiac leads. Degenerative changes seen along the spine and shoulders. IMPRESSION: Consolidative opacity seen  along the right lower lobe new from previous. Possible acute infiltrate recommend follow-up. Mild left basilar atelectasis. Electronically Signed   By: Karen Kays M.D.   On: 11/09/2022 16:24        Scheduled Meds:  amLODipine  10 mg Oral Daily   aspirin EC  81 mg Oral Daily   enoxaparin (LOVENOX) injection  40 mg Subcutaneous QHS   feeding supplement  237 mL Oral Q24H   folic acid  1 mg Oral Daily   hydrALAZINE  50 mg Oral Daily   metoprolol succinate  50 mg Oral Daily   multivitamin with minerals  1 tablet Oral Daily   rosuvastatin  5 mg Oral Daily   senna-docusate  1 tablet Oral BID   Continuous Infusions:  ampicillin-sulbactam (UNASYN) IV Stopped (11/11/22 0334)   sodium bicarbonate 75 mEq in sodium chloride 0.45 % 1,075 mL infusion 75 mL/hr at 11/11/22 0600          Glade Lloyd, MD [facility] 11/11/2022, 7:38 AM

## 2022-11-11 NOTE — Progress Notes (Signed)
Mobility Specialist - Progress Note   11/11/22 1512  Mobility  Activity Ambulated with assistance in hallway  Level of Assistance Standby assist, set-up cues, supervision of patient - no hands on  Assistive Device Front wheel walker  Distance Ambulated (ft) 160 ft  Activity Response Tolerated well  Mobility Referral Yes  $Mobility charge 1 Mobility  Mobility Specialist Start Time (ACUTE ONLY) 0253  Mobility Specialist Stop Time (ACUTE ONLY) 0311  Mobility Specialist Time Calculation (min) (ACUTE ONLY) 18 min   Pt received in recliner and agreeable to mobility. No complaints during session. Pt to bed after session with all needs met. Bed alarm on.  [facility]

## 2022-11-11 NOTE — Progress Notes (Signed)
Physical Therapy Treatment Patient Details Name: NISHIKA BACHARACH MRN: [id number] DOB: [birth year] Today's Date: 11/11/2022   History of Present Illness 72 y.o. female who presents to the ED due to chest pain. PMH: CKD stage IIIa, hypertension, HFpEF, type 2 diabetes (diet-controlled), hypothyroidism    PT Comments    Pt is mobilizing well, she feel she can manage at home with assistance from her daughter and granddaughter. She ambulated 120' with RW, no loss of balance. Min assist for sit to stand. She is ready to DC home from a PT standpoint.     Recommendations for follow up therapy are one component of a multi-disciplinary discharge planning process, led by the attending physician.  Recommendations may be updated based on patient status, additional functional criteria and insurance authorization.  Follow Up Recommendations       Assistance Recommended at Discharge Set up Supervision/Assistance  Patient can return home with the following A little help with walking and/or transfers;A little help with bathing/dressing/bathroom;Assistance with cooking/housework;Assist for transportation;Help with stairs or ramp for entrance   Equipment Recommendations  None recommended by PT    Recommendations for Other Services       Precautions / Restrictions Precautions Precautions: Fall Restrictions Weight Bearing Restrictions: No     Mobility  Bed Mobility Overal bed mobility: Needs Assistance Bed Mobility: Supine to Sit     Supine to sit: Min assist, HOB elevated     General bed mobility comments: min A to raise trunk    Transfers Overall transfer level: Needs assistance Equipment used: Rolling walker (2 wheels) Transfers: Sit to/from Stand Sit to Stand: Supervision           General transfer comment: cues for hand placement    Ambulation/Gait Ambulation/Gait assistance: Min guard Gait Distance (Feet): 120 Feet Assistive device: Rolling walker (2 wheels) Gait  Pattern/deviations: Trunk flexed, Step-through pattern, Decreased stride length, Knee flexed in stance - left, Knee flexed in stance - right Gait velocity: decr     General Gait Details: verbal and visual cues for proximity to RW and trunk extension, distance limited by back pain which pt reports is chronic, SpO2 97% on room air walking   Stairs             Wheelchair Mobility    Modified Rankin (Stroke Patients Only)       Balance Overall balance assessment: Modified Independent Sitting-balance support: Feet supported, No upper extremity supported Sitting balance-Leahy Scale: Good     Standing balance support: Reliant on assistive device for balance, During functional activity, Bilateral upper extremity supported Standing balance-Leahy Scale: Fair                              Cognition Arousal/Alertness: Awake/alert Behavior During Therapy: WFL for tasks assessed/performed Overall Cognitive Status: Within Functional Limits for tasks assessed                                 General Comments: requires occasional repetition for safety cues        Exercises      General Comments        Pertinent Vitals/Pain Pain Assessment Pain Assessment: 0-10 Pain Score: 3  Pain Location: back (chronic) Pain Descriptors / Indicators: Aching Pain Intervention(s): Limited activity within patient's tolerance, Monitored during session, Patient requesting pain meds-RN notified, Repositioned    Home Living  Prior Function            PT Goals (current goals can now be found in the care plan section) Acute Rehab PT Goals PT Goal Formulation: With patient Time For Goal Achievement: 11/24/22 Potential to Achieve Goals: Good Progress towards PT goals: Progressing toward goals    Frequency    Min 1X/week      PT Plan Current plan remains appropriate    Co-evaluation              AM-PAC PT "6  Clicks" Mobility   Outcome Measure  Help needed turning from your back to your side while in a flat bed without using bedrails?: None Help needed moving from lying on your back to sitting on the side of a flat bed without using bedrails?: A Little Help needed moving to and from a bed to a chair (including a wheelchair)?: A Little Help needed standing up from a chair using your arms (e.g., wheelchair or bedside chair)?: A Little Help needed to walk in hospital room?: None Help needed climbing 3-5 steps with a railing? : A Little 6 Click Score: 20    End of Session Equipment Utilized During Treatment: Gait belt Activity Tolerance: Patient tolerated treatment well Patient left: in chair;with call bell/phone within reach;with chair alarm set Nurse Communication: Mobility status PT Visit Diagnosis: Other abnormalities of gait and mobility (R26.89);Difficulty in walking, not elsewhere classified (R26.2)     Time: 1610-9604 PT Time Calculation (min) (ACUTE ONLY): 33 min  Charges:  $Gait Training: 8-22 mins $Therapeutic Activity: 8-22 mins                     Ralene Bathe Kistler PT 11/11/2022  Acute Rehabilitation Services  Office [phone]

## 2022-11-11 NOTE — Progress Notes (Signed)
Speech Language Pathology Treatment: Dysphagia  Patient Details Name: JOLIANA AHOLA MRN: [id number] DOB: 06/08/1950 Today's Date: 11/11/2022 Time: 0865-7846 SLP Time Calculation (min) (ACUTE ONLY): 20 min  Assessment / Plan / Recommendation Clinical Impression  Pt seen for follow-up swallow tx/education provided re: swallow function/satiety.  Pt stated she "wants to eat now and has eaten a lot" when SLP entered room.  Esophagram indicated mild esophageal dysmotility, but no aspiration of thin liquids/no evidence of GERD, only laryngeal penetration with successive swallows of thin.  Pt exhibited a delayed cough with multiple swallows of this potentially d/t laryngeal invasion as she drank a large volume of thin liquids via straw.  Regular/puree consistency observed also with multiple swallows/effortful swallow encouraged prn, but no overt s/sx of aspiration observed.  Discussed esophageal precautions with pt/family and need for following these during consumption of po's and pt/family in agreement.  Discussed small bites/sips, slow rate and upright positioning during and after meals.  Multiple swallows and liquid wash may also be beneficial for pt during meals.  Recommend progressing diet to Dysphagia 3/thin liquids with esophageal/aspiration precautions in place.  ST will s/o in acute setting.    HPI HPI: 72 yo female adm with lingual edema, cough and expectoration of bloody secretions. She was found to have cavitary lesion in her right lung = concerning for cavitation. She has PMH + for DM *she denies*, CHF, prior smoker 2 PPD for 17 years (quit 1996), generalized weakness and weight loss. Daughter reports she can get pt to take a few small bites or sips and then she will stop intake. Admits to significant gustatory changes - saying nothing tastes good. Left facial asymmetry is baseline per pt and daughter.  ST recommended thin liquids with f/u esophagram.  ST f/u to discuss results of esophagram as related  to swallowing function.      SLP Plan  All goals met      Recommendations for follow up therapy are one component of a multi-disciplinary discharge planning process, led by the attending physician.  Recommendations may be updated based on patient status, additional functional criteria and insurance authorization.    Recommendations  Diet recommendations: Dysphagia 3 (mechanical soft);Thin liquid Liquids provided via: Cup;Straw Medication Administration: Whole meds with puree (or as tolerated) Supervision: Patient able to self feed;Intermittent supervision to cue for compensatory strategies Compensations: Slow rate;Small sips/bites;Multiple dry swallows after each bite/sip;Follow solids with liquid;Effortful swallow Postural Changes and/or Swallow Maneuvers: Seated upright 90 degrees;Upright 30-60 min after meal                  Oral care BID   Intermittent Supervision/Assistance Dysphagia, unspecified (R13.10)     All goals met     Pat Everlene Cunning,M.S., CCC-SLP  11/11/2022, 3:52 PM

## 2022-11-11 NOTE — Telephone Encounter (Signed)
Scheduled for HFU 7/15 with Beth. CXR indicated in notes. Reminder mailed. Nothing further needed.

## 2022-11-12 DIAGNOSIS — N189 Chronic kidney disease, unspecified: Secondary | ICD-10-CM | POA: Diagnosis not present

## 2022-11-12 DIAGNOSIS — N179 Acute kidney failure, unspecified: Secondary | ICD-10-CM | POA: Diagnosis not present

## 2022-11-12 DIAGNOSIS — J690 Pneumonitis due to inhalation of food and vomit: Secondary | ICD-10-CM | POA: Diagnosis not present

## 2022-11-12 DIAGNOSIS — R634 Abnormal weight loss: Secondary | ICD-10-CM | POA: Diagnosis not present

## 2022-11-12 LAB — GLUCOSE, CAPILLARY
Glucose-Capillary: 91 mg/dL (ref 70–99)
Glucose-Capillary: 124 mg/dL — ABNORMAL HIGH (ref 70–99)
Glucose-Capillary: 102 mg/dL — ABNORMAL HIGH (ref 70–99)
Glucose-Capillary: 92 mg/dL (ref 70–99)

## 2022-11-12 LAB — BASIC METABOLIC PANEL
Anion gap: 7 (ref 5–15)
BUN: 34 mg/dL — ABNORMAL HIGH (ref 8–23)
CO2: 23 mmol/L (ref 22–32)
Calcium: 8.7 mg/dL — ABNORMAL LOW (ref 8.9–10.3)
Chloride: 108 mmol/L (ref 98–111)
Creatinine, Ser: 1.08 mg/dL — ABNORMAL HIGH (ref 0.44–1.00)
GFR, Estimated: 55 mL/min — ABNORMAL LOW (ref >60)
Glucose, Bld: 96 mg/dL (ref 70–99)
Potassium: 3.2 mmol/L — ABNORMAL LOW (ref 3.5–5.1)
Sodium: 138 mmol/L (ref 135–145)

## 2022-11-12 LAB — CULTURE, BLOOD (ROUTINE X 2): Special Requests: ADEQUATE

## 2022-11-12 LAB — MAGNESIUM: Magnesium: 1.7 mg/dL (ref 1.7–2.4)

## 2022-11-12 LAB — LEGIONELLA PNEUMOPHILA SEROGP 1 UR AG: L. pneumophila Serogp 1 Ur Ag: NEGATIVE

## 2022-11-12 MED ORDER — POTASSIUM CHLORIDE CRYS ER 20 MEQ PO TBCR
40.0000 meq | EXTENDED_RELEASE_TABLET | ORAL | Status: AC
  Administered 2022-11-12 (×2): 40 meq via ORAL
  Filled 2022-11-12 (×2): qty 2

## 2022-11-12 NOTE — Progress Notes (Signed)
Mobility Specialist - Progress Note   11/12/22 1511  Mobility  Activity Ambulated with assistance in hallway  Level of Assistance Contact guard assist, steadying assist  Assistive Device Front wheel walker  Distance Ambulated (ft) 150 ft  Range of Motion/Exercises Active Assistive  Activity Response Tolerated well  Mobility Referral Yes  $Mobility charge 1 Mobility  Mobility Specialist Start Time (ACUTE ONLY) 1500  Mobility Specialist Stop Time (ACUTE ONLY) 1510  Mobility Specialist Time Calculation (min) (ACUTE ONLY) 10 min   Pt was found on recliner chair and agreeable to ambulate. Grew fatigued with session and at EOS returned to bed with needs met. Call bell in reach and daughter in room. NT notified.  Billey Chang Mobility Specialist

## 2022-11-12 NOTE — Progress Notes (Signed)
   11/12/22 2132  BiPAP/CPAP/SIPAP  Reason BIPAP/CPAP not in use Non-compliant (she said she doest wear anything at home)

## 2022-11-12 NOTE — Progress Notes (Signed)
Michaela NOTE    CANON ORIORDAN  ZOX:[id number] DOB: [birth year] DOA: 11/09/2022 PCP: Diamantina Providence, FNP   Brief Narrative:  72 y.o. female with medical history significant of CKD stage IIIa, hypertension, HFpEF, type 2 diabetes (diet-controlled), hypothyroidism presented with right-sided chest pain along with cough, poor appetite and recent weight loss.  On presentation, chest x-ray showed right lower lobe consolidative opacity..  She was started on IV antibiotics.  CT chest without contrast showed right upper lobe airspace consolidation with area of thick walled cavitation.  Pulmonary was consulted.  Assessment & Plan:   Right upper lobe cavitary pneumonia likely from aspiration pneumonitis -Currently on Unasyn.  Continue the same for today.  Pulmonary recommended at least 4 weeks of antibiotics: Possibly Augmentin on discharge; will need follow-up imaging in 4 to 6 weeks as an outpatient.  Pulmonary signed off on 11/10/2022. -SLP following.  Aspiration precautions -Currently on room air.  Acute kidney injury on CKD stage IIIb Acute metabolic acidosis -Baseline creatinine 1.3-1.4.  Creatinine 1.69 on presentation.  Creatinine 1. 08 today.  Currently on bicarb drip.  Bicarbonate 23 today.  DC bicarb drip. -Monitor.  Hypokalemia -Replace.  Repeat a.m. labs  Acute metabolic encephalopathy -Possibly from above.  Unclear if the patient has underlying undiagnosed dementia. -Still slow to respond.  Monitor mental status.  Fall precautions. -TSH less than 0.010; T4 slightly elevated at 1.52.  Needs to be repeated in the next few weeks.  B12 and ammonia levels normal.  Continue empiric folic acid supplementation.  Possible dysphagia -SLP following.  Esophagram showed mild esophageal dysmotility; no esophageal mucosal abnormalities.  Outpatient follow-up with GI. -Continue diet as per SLP recommendations  Chronic diastolic heart failure -Currently compensated.  Strict input and output.   Daily weights.  Continue metoprolol and hydralazine.  Diuretics on hold.  Outpatient follow-up with cardiology.  Hyperkalemia -Improved.  Hyponatremia -improved  Hypertension -Blood pressure on the lower side.  Continue metoprolol, hydralazine and amlodipine  Diabetes mellitus type 2 -Well-controlled with [place].  Farxiga on hold.  Blood sugars currently stable  Hyperlipidemia -Continue statin  Anemia of chronic disease/microcytic anemia -From chronic illnesses.  Hemoglobin stable.  Monitor intermittently  Abnormal weight loss Poor oral intake/failure to thrive Severe malnutrition -Unclear cause.  Colonoscopy and mammogram completed within the last year.  Has been referred to GI as an outpatient. -Outpatient follow-up with PCP and gastroenterology -Follow nutrition recommendations  Generalized weakness -PT recommends home health PT.  TOC following.  OSA -Patient apparently does not use CPAP at home.  Outpatient follow-up with PCP  DVT prophylaxis: Lovenox Code Status: Full Family Communication: Spoke to daughter/Shuntel on phone on 11/11/2022 Disposition Plan: Status is: Inpatient Remains inpatient appropriate because: Of severity of illness.  Possible discharge in 1 to 2 days if clinically improves  Consultants: Pulmonary  Procedures: Baker  Antimicrobials: Unasyn   Subjective: Patient seen and examined at bedside.  Oral intake is still poor.  Poor historian.  No fever, vomiting, seizures reported.   Objective: Vitals:   11/11/22 1400 11/11/22 2137 11/12/22 0452 11/12/22 0513  BP: 109/75 111/61  (!) 103/59  Pulse: 77 68  65  Resp: 16 20  18   Temp: 98.1 F (36.7 C) 99.1 F (37.3 C)  98.8 F (37.1 C)  TempSrc: Oral Oral  Oral  SpO2: 100% 96%  99%  Weight:   72.2 kg   Height:        Intake/Output Summary (Last 24 hours) at 11/12/2022 8119 Last data filed  at 11/12/2022 0700 Gross per 24 hour  Intake 2565.28 ml  Output 450 ml  Net 2115.28 ml    Filed  Weights   11/10/22 0435 11/11/22 0514 11/12/22 0452  Weight: 71.3 kg 72.1 kg 72.2 kg    Examination:  General: No distress.  Still on room air.  Looks chronically ill and deconditioned ENT/neck: No neck masses palpable or JVD elevation noted  respiratory: Bilateral decreased breath sounds at bases with scattered crackles  CVS: Rate mostly controlled; S1 and S2 are heard  abdominal: Soft, nontender, distended mildly; no organomegaly,  bowel sounds are heard normally Extremities: No clubbing; mild lower extremity edema present  CNS: Alert and awake.  Still very slow to respond.  Poor historian.  No focal neurologic deficit.  Able to move extremities  lymph: No palpable lymphadenopathy Skin: No obvious rashes/petechiae  psych: Not agitated currently.  Very flat affect.   Musculoskeletal: No obvious joint erythema/tenderness   Data Reviewed: I have personally reviewed following labs and imaging studies  CBC: Recent Labs  Lab 11/07/22 2000 11/09/22 1625 11/10/22 0430 11/11/22 0349  WBC 9.3 10.9* 7.6 7.4  NEUTROABS  --  9.5* 7.1 6.2  HGB 9.5* 10.6* 10.2* 8.3*  HCT 31.8* 34.5* 33.5* 27.1*  MCV 83.0 80.2 81.7 79.7*  PLT 252 289 267 251    Basic Metabolic Panel: Recent Labs  Lab 11/07/22 2000 11/09/22 1625 11/10/22 0430 11/11/22 0349 11/12/22 0431  NA 131* 134* 135 137 138  K 4.9 5.0 5.3* 4.2 3.2*  CL 106 108 108 109 108  CO2 13* 14* 16* 20* 23  GLUCOSE 82 112* 147* 140* 96  BUN 47* 47* 44* 45* 34*  CREATININE 2.05* 1.69* 1.62* 1.24* 1.08*  CALCIUM 9.4 9.9 9.4 9.1 8.7*  MG  --   --   --  1.8 1.7    GFR: Estimated Creatinine Clearance: 46.9 mL/min (A) (by C-G formula based on SCr of 1.08 mg/dL (H)). Liver Function Tests: Recent Labs  Lab 11/09/22 1625  AST 20  ALT 10  ALKPHOS 37*  BILITOT 0.6  PROT 8.9*  ALBUMIN 2.5*    No results for input(s): "LIPASE", "AMYLASE" in the last 168 hours. Recent Labs  Lab 11/11/22 0349  AMMONIA 19    Coagulation  Profile: No results for input(s): "INR", "PROTIME" in the last 168 hours. Cardiac Enzymes: No results for input(s): "CKTOTAL", "CKMB", "CKMBINDEX", "TROPONINI" in the last 168 hours. BNP (last 3 results) No results for input(s): "PROBNP" in the last 8760 hours. HbA1C: Recent Labs    11/09/22 1625  HGBA1C 4.9    CBG: Recent Labs  Lab 11/10/22 2128 11/11/22 0524 11/11/22 0726 11/11/22 2133 11/12/22 0742  GLUCAP 149* 122* 114* 88 91    Lipid Profile: No results for input(s): "CHOL", "HDL", "LDLCALC", "TRIG", "CHOLHDL", "LDLDIRECT" in the last 72 hours. Thyroid Function Tests: Recent Labs    11/09/22 1907  TSH <0.010*  FREET4 1.52*    Anemia Panel: Recent Labs    11/11/22 0349  VITAMINB12 480    Sepsis Labs: Recent Labs  Lab 11/09/22 1800 11/09/22 1901  PROCALCITON 3.87  --   LATICACIDVEN  --  2.2*     Recent Results (from the past 240 hour(s))  MRSA Next Gen by PCR, Nasal     Status: Baker   Collection Time: 11/09/22  3:22 AM   Specimen: Nasal Mucosa; Nasal Swab  Result Value Ref Range Status   MRSA by PCR Next Gen NOT DETECTED NOT DETECTED Final  Comment: (NOTE) The GeneXpert MRSA Assay (FDA approved for NASAL specimens only), is one component of a comprehensive MRSA colonization surveillance program. It is not intended to diagnose MRSA infection nor to guide or monitor treatment for MRSA infections. Test performance is not FDA approved in patients less than 88 years old. Performed at [facility], [address], [place], [place] [address]   Blood culture (routine x 2)     Status: Baker (Preliminary result)   Collection Time: 11/09/22  6:00 PM   Specimen: BLOOD RIGHT ARM  Result Value Ref Range Status   Specimen Description   Final    BLOOD RIGHT ARM Performed at [facility], [address], [place], [place] [address]    Special Requests   Final    BOTTLES DRAWN AEROBIC AND ANAEROBIC Blood Culture adequate  volume Performed at [facility], [address], [place], [place] [address]    Culture   Final    NO GROWTH 2 DAYS Performed at [facility], [address], [place], [place] [address]    Report Status PENDING  Incomplete  Blood culture (routine x 2)     Status: Baker (Preliminary result)   Collection Time: 11/09/22  6:10 PM   Specimen: BLOOD RIGHT ARM  Result Value Ref Range Status   Specimen Description   Final    BLOOD RIGHT ARM Performed at [facility], [address] [place], [place] [address]    Special Requests   Final    BOTTLES DRAWN AEROBIC AND ANAEROBIC Blood Culture adequate volume Performed at [facility], [address] [place], [place] [address]    Culture   Final    NO GROWTH 2 DAYS Performed at [facility], [address], [place], [place] [address]    Report Status PENDING  Incomplete  Urine Culture (for pregnant, neutropenic or urologic patients or patients with an indwelling urinary catheter)     Status: Abnormal   Collection Time: 11/10/22  4:36 AM   Specimen: Urine, Clean Catch  Result Value Ref Range Status   Specimen Description   Final    URINE, CLEAN CATCH Performed at [facility], [address], [place], [place] [address]    Special Requests   Final    Baker Performed at [facility], [address], [place], [place] [address]    Culture (A)  Final    <10,000 COLONIES/mL INSIGNIFICANT GROWTH Performed at [facility], [address], [place], [place] [address]    Report Status 11/11/2022 FINAL  Final         Radiology Studies: DG ESOPHAGUS W SINGLE CM (SOL OR THIN BA)  Result Date: 11/11/2022 CLINICAL DATA:  72 year old female admitted for pneumonia. Patient referred for fluoroscopic esophagram study for dysphagia and suspected aspiration pneumonia. EXAM: ESOPHAGUS/BARIUM SWALLOW/TABLET STUDY TECHNIQUE: Single contrast examination was  performed using thin liquid barium. This exam was performed by Alex Gardener, NP, and was supervised and interpreted by Carey Bullocks, MD. FLUOROSCOPY: Radiation Exposure Index (as provided by the fluoroscopic device): 13.20 mGy Kerma COMPARISON:  Chest CT 11/09/2022 FINDINGS: Limited exam due to patient's inability to stand or position independently on fluoroscopy table. Swallowing: Vestibular penetration of thin barium on sequential swallows. No aspiration observed. Pharynx: Unremarkable. Esophagus: No mucosal abnormalities are identified. Esophageal motility: Mild esophageal dysmotility with to and fro movement and stasis of barium on swallows. Hiatal Hernia: Baker significant.  Gastroesophageal reflux: No gastroesophageal reflux observed with provocation maneuvers. Ingested 13mm barium tablet: 13 mm barium tablet passed normally. Other: Baker. IMPRESSION: Limited exam due to patient's inability to stand or position independently on fluoroscopy table. 1. Vestibular penetration of thin barium without aspiration. 2. Mild esophageal dysmotility. 3. No esophageal mucosal abnormalities. Electronically Signed   By: Carey Bullocks M.D.   On: 11/11/2022 09:33        Scheduled Meds:  amLODipine  10 mg Oral Daily   aspirin EC  81 mg Oral Daily   enoxaparin (LOVENOX) injection  40 mg Subcutaneous QHS   feeding supplement  237 mL Oral Q24H   folic acid  1 mg Oral Daily   hydrALAZINE  50 mg Oral Daily   metoprolol succinate  50 mg Oral Daily   multivitamin with minerals  1 tablet Oral Daily   rosuvastatin  5 mg Oral Daily   senna-docusate  1 tablet Oral BID   Continuous Infusions:  ampicillin-sulbactam (UNASYN) IV Stopped (11/12/22 0328)   sodium bicarbonate 75 mEq in sodium chloride 0.45 % 1,075 mL infusion 50 mL/hr at 11/12/22 0700          Glade Lloyd, MD [facility] 11/12/2022, 8:03 AM

## 2022-11-12 NOTE — Progress Notes (Signed)
   11/11/22 2300  BiPAP/CPAP/SIPAP  Reason BIPAP/CPAP not in use Non-compliant (Pt. stated she does not wear, noted in chart.)

## 2022-11-13 DIAGNOSIS — R531 Weakness: Secondary | ICD-10-CM | POA: Diagnosis not present

## 2022-11-13 DIAGNOSIS — N179 Acute kidney failure, unspecified: Secondary | ICD-10-CM | POA: Diagnosis not present

## 2022-11-13 DIAGNOSIS — J690 Pneumonitis due to inhalation of food and vomit: Secondary | ICD-10-CM | POA: Diagnosis not present

## 2022-11-13 DIAGNOSIS — E872 Acidosis, unspecified: Secondary | ICD-10-CM | POA: Diagnosis not present

## 2022-11-13 LAB — BASIC METABOLIC PANEL
Anion gap: 7 (ref 5–15)
BUN: 8 mg/dL (ref 8–23)
CO2: 24 mmol/L (ref 22–32)
Calcium: 8.5 mg/dL — ABNORMAL LOW (ref 8.9–10.3)
Chloride: 99 mmol/L (ref 98–111)
Creatinine, Ser: 0.77 mg/dL (ref 0.44–1.00)
GFR, Estimated: >60 mL/min (ref >60)
Glucose, Bld: 120 mg/dL — ABNORMAL HIGH (ref 70–99)
Potassium: 4.2 mmol/L (ref 3.5–5.1)
Sodium: 130 mmol/L — ABNORMAL LOW (ref 135–145)

## 2022-11-13 LAB — MAGNESIUM: Magnesium: 2.1 mg/dL (ref 1.7–2.4)

## 2022-11-13 MED ORDER — AMOXICILLIN-POT CLAVULANATE 875-125 MG PO TABS: 1.0000 | ORAL_TABLET | Freq: Two times a day (BID) | ORAL | 0 refills | Status: AC

## 2022-11-13 MED ORDER — FOLIC ACID 1 MG PO TABS: 1.0000 mg | ORAL_TABLET | Freq: Every day | ORAL | 0 refills | Status: DC

## 2022-11-13 NOTE — TOC Transition Note (Signed)
Transition of Care [facility]) - CM/SW Discharge Note   Patient Details  Name: Michaela Baker MRN: [id number] Date of Birth: 1951-01-18  [facility] of Care [facility] CM/SW Contact:  Lavenia Atlas, RN Phone Number: 11/13/2022, 10:26 AM   Clinical Narrative:   Per chart review patient is set up with [facility] for HHPT. This RNCM notified Lynette with [facility] to advise patient being discharged today for HHPT/OT services, updated AVS.  No additional TOC needs at this time.    Final next level of care: Home w Home Health Services Barriers to Discharge: No Barriers Identified   Patient Goals and CMS Choice CMS Medicare.gov Compare Post Acute Care list provided to:: Patient Choice offered to / list presented to : Patient  Discharge Placement                         Discharge Plan and Services Additional resources added to the After Visit Summary for                  DME Arranged: N/A DME Agency: NA       HH Arranged: PT HH Agency: [facility] Date [facility] Agency Contacted: 11/13/22 Time HH Agency Contacted: 1026 Representative spoke with at [facility] Agency: Haywood Lasso  Social Determinants of Health (SDOH) Interventions SDOH Screenings   Food Insecurity: No Food Insecurity (11/10/2022)  Housing: Low Risk  (11/10/2022)  Transportation Needs: No Transportation Needs (11/10/2022)  Utilities: Not At Risk (11/10/2022)  Tobacco Use: Low Risk  (11/09/2022)     Readmission Risk Interventions    11/10/2022   11:07 AM 06/03/2022    4:43 PM  Readmission Risk Prevention Plan  Transportation Screening Complete Complete  PCP or Specialist Appt within 5-7 Days  Complete  PCP or Specialist Appt within 3-5 Days Complete   Home Care Screening  Complete  Medication Review (RN CM)  Complete  HRI or Home Care Consult Complete   Social Work Consult for Recovery Care Planning/Counseling Complete   Palliative Care Screening Not Applicable   Medication Review Oceanographer) Complete

## 2022-11-13 NOTE — Discharge Summary (Signed)
Physician Discharge Summary  Michaela Baker RUE:[id number] DOB: Nov 21, 1950 DOA: 11/09/2022  PCP: Diamantina Providence, FNP  Admit date: 11/09/2022 Discharge date: 11/13/2022  Admitted From: Home Disposition: Home  Recommendations for Outpatient Follow-up:  Follow up with PCP in 1 week with repeat CBC/BMP Outpatient follow-up with pulmonary Follow up in ED if symptoms worsen or new appear   Home Health: Home health PT/OT Equipment/Devices: None  Discharge Condition: Stable CODE STATUS: Full Diet recommendation: Heart healthy.  Diet as per SLP recommendations.  Brief/Interim Summary: 72 y.o. female with medical history significant of CKD stage IIIa, hypertension, HFpEF, type 2 diabetes (diet-controlled), hypothyroidism presented with right-sided chest pain along with cough, poor appetite and recent weight loss.  On presentation, chest x-ray showed right lower lobe consolidative opacity..  She was started on IV antibiotics.  CT chest without contrast showed right upper lobe airspace consolidation with area of thick walled cavitation.  Pulmonary was consulted: Recommended 4 weeks of antibiotics at least and signed off.  Outpatient follow-up with pulmonary.  Patient feels much better and wants to go home today.  Her oral intake is slightly improving.  She will be discharged home today on oral Augmentin.  Outpatient follow-up with PCP and pulmonary.    Discharge Diagnoses:   Right upper lobe cavitary pneumonia likely from aspiration pneumonitis -Currently on Unasyn.  Pulmonary recommended at least 4 weeks of antibiotics: Possibly Augmentin on discharge; will need follow-up imaging in 4 to 6 weeks as an outpatient.  Pulmonary signed off on 11/10/2022. -SLP following.  Aspiration precautions.  Diet as per SLP recommendations -Currently on room air. -Patient feels much better and wants to go home today.  Her oral intake is slightly improving.  She will be discharged home today on oral Augmentin.   Outpatient follow-up with PCP and pulmonary.   Acute kidney injury on CKD stage IIIb Acute metabolic acidosis -Baseline creatinine 1.3-1.4.  Creatinine 1.69 on presentation.  Creatinine 0.77 today.  Off of bicarb drip.  Bicarbonate 24 today.  Outpatient follow-up.  Hypokalemia -Improved  Acute metabolic encephalopathy -Possibly from above.  Unclear if the patient has underlying undiagnosed dementia. -Still slow to respond.  Mental status has much improved. -TSH less than 0.010; T4 slightly elevated at 1.52.  Needs to be repeated in the next few weeks.  B12 and ammonia levels normal.  Continue empiric folic acid supplementation.   Possible dysphagia -SLP following.  Esophagram showed mild esophageal dysmotility; no esophageal mucosal abnormalities.  Outpatient follow-up with GI. -Continue diet as per SLP recommendations   Chronic diastolic heart failure -Currently compensated.  Continue diet and fluid restriction.  Continue metoprolol and hydralazine.  Diuretics on hold till reevaluation by PCP and/or cardiology.  Resume Farxiga   hyperkalemia -Improved.   Hyponatremia -Mild.  Outpatient follow-up.   Hypertension -Blood pressure stable.  Continue metoprolol, hydralazine and amlodipine.  Hold Lasix.   Diabetes mellitus type 2 -Well-controlled with [place].  Marcelline Deist to be resumed on discharge.  Blood sugars currently stable.  Outpatient follow-up   Hyperlipidemia -Continue statin   Anemia of chronic disease/microcytic anemia -From chronic illnesses.  Hemoglobin stable.  Monitor intermittently as an outpatient   Abnormal weight loss Poor oral intake/failure to thrive Severe malnutrition -Unclear cause.  Colonoscopy and mammogram completed within the last year.  Has been referred to GI as an outpatient. -Outpatient follow-up with PCP and gastroenterology -Follow nutrition recommendations   Generalized weakness -PT/OT recommend home health PT/OT.  TOC following.    OSA -Patient apparently does  not use CPAP at home.  Outpatient follow-up with PCP  Discharge Instructions  Discharge Instructions     Diet - low sodium heart healthy   Complete by: As directed    Increase activity slowly   Complete by: As directed    No wound care   Complete by: As directed       Allergies as of 11/13/2022   No Known Allergies      Medication List     STOP taking these medications    furosemide 40 MG tablet Commonly known as: LASIX   potassium chloride SA 20 MEQ tablet Commonly known as: KLOR-CON M       TAKE these medications    acetaminophen 500 MG tablet Commonly known as: TYLENOL Take 1,000 mg by mouth as needed (pain).   amLODipine 10 MG tablet Commonly known as: NORVASC Take 10 mg by mouth daily.   amoxicillin-clavulanate 875-125 MG tablet Commonly known as: AUGMENTIN Take 1 tablet by mouth 2 (two) times daily for 25 days.   aspirin EC 81 MG tablet Take 1 tablet (81 mg total) by mouth daily.   dapagliflozin propanediol 10 MG Tabs tablet Commonly known as: FARXIGA Take 10 mg by mouth daily.   folic acid 1 MG tablet Commonly known as: FOLVITE Take 1 tablet (1 mg total) by mouth daily.   hydrALAZINE 50 MG tablet Commonly known as: APRESOLINE Take 50 mg by mouth daily.   Kerendia 10 MG Tabs Generic drug: Finerenone take 1 tablet by mouth daily for chronic kidney disease   levothyroxine 25 MCG tablet Commonly known as: SYNTHROID Take 12.5 mcg by mouth daily.   megestrol 40 MG tablet Commonly known as: MEGACE Take 1 tablet by mouth 2 (two) times daily.   metoprolol succinate 50 MG 24 hr tablet Commonly known as: TOPROL-XL Take 50 mg by mouth daily. Take with or immediately following a meal.   multivitamin with minerals tablet Take 2 tablets by mouth daily.   rosuvastatin 5 MG tablet Commonly known as: CRESTOR Take 5 mg by mouth daily.        Follow-up Information     Diamantina Providence, FNP. Schedule an  appointment as soon as possible for a visit in 1 week(s).   Specialty: Nurse Practitioner Contact information: [address] [place] [address] [phone]                No Known Allergies  Consultations: Pulmonary   Procedures/Studies: DG ESOPHAGUS W SINGLE CM (SOL OR THIN BA)  Result Date: 11/11/2022 CLINICAL DATA:  72 year old female admitted for pneumonia. Patient referred for fluoroscopic esophagram study for dysphagia and suspected aspiration pneumonia. EXAM: ESOPHAGUS/BARIUM SWALLOW/TABLET STUDY TECHNIQUE: Single contrast examination was performed using thin liquid barium. This exam was performed by Alex Gardener, NP, and was supervised and interpreted by Carey Bullocks, MD. FLUOROSCOPY: Radiation Exposure Index (as provided by the fluoroscopic device): 13.20 mGy Kerma COMPARISON:  Chest CT 11/09/2022 FINDINGS: Limited exam due to patient's inability to stand or position independently on fluoroscopy table. Swallowing: Vestibular penetration of thin barium on sequential swallows. No aspiration observed. Pharynx: Unremarkable. Esophagus: No mucosal abnormalities are identified. Esophageal motility: Mild esophageal dysmotility with to and fro movement and stasis of barium on swallows. Hiatal Hernia: None significant. Gastroesophageal reflux: No gastroesophageal reflux observed with provocation maneuvers. Ingested 13mm barium tablet: 13 mm barium tablet passed normally. Other: None. IMPRESSION: Limited exam due to patient's inability to stand or position independently on fluoroscopy table. 1.  Vestibular penetration of thin barium without aspiration. 2. Mild esophageal dysmotility. 3. No esophageal mucosal abnormalities. Electronically Signed   By: Carey Bullocks M.D.   On: 11/11/2022 09:33   CT CHEST WO CONTRAST  Result Date: 11/09/2022 CLINICAL DATA:  Chest pain, hemoptysis. Respiratory illness, nondiagnostic xray EXAM: CT CHEST WITHOUT CONTRAST TECHNIQUE:  Multidetector CT imaging of the chest was performed following the standard protocol without IV contrast. RADIATION DOSE REDUCTION: This exam was performed according to the departmental dose-optimization program which includes automated exposure control, adjustment of the mA and/or kV according to patient size and/or use of iterative reconstruction technique. COMPARISON:  Chest x-ray today and 11/07/2022 FINDINGS: Cardiovascular: Heart is normal size. Aorta is normal caliber. Extensive coronary artery calcifications and aortic calcifications. Mediastinum/Nodes: No mediastinal, hilar, or axillary adenopathy. Trachea and esophagus are unremarkable. Right thyroid nodule measures 1.8 cm. Lungs/Pleura: Mild centrilobular emphysema. Consolidation noted in the right upper lobe. Area of thick walled cavitation noted in the anterior right upper lobe. This appears new from chest x-ray 2 days ago and is therefore most compatible cavitary pneumonia. Platelike scarring in the superior segment of the left lower lobe and bibasilar atelectasis or scarring. No effusions. Upper Abdomen: No acute findings Musculoskeletal: Chest wall soft tissues are unremarkable. No acute bony abnormality. IMPRESSION: Airspace consolidation in the right upper lobe with area of thick walled cavitation. Given the rapid onset, this is most concerning for cavitary pneumonia. Recommend follow-up to resolution. Coronary artery disease. Aortic Atherosclerosis (ICD10-I70.0) and Emphysema (ICD10-J43.9). Electronically Signed   By: Charlett Nose M.D.   On: 11/09/2022 19:06   DG Chest Portable 1 View  Result Date: 11/09/2022 CLINICAL DATA:  Chest pain EXAM: PORTABLE CHEST 1 VIEW COMPARISON:  X-ray 11/07/2022 and older FINDINGS: Development of a area of consolidative opacity in the right mid to lower lung. This could be lower lobe. Mild linear opacity left lung base likely scar or atelectasis. No pneumothorax, effusion or edema. Normal cardiopericardial  silhouette. Calcified aorta. Overlapping cardiac leads. Degenerative changes seen along the spine and shoulders. IMPRESSION: Consolidative opacity seen along the right lower lobe new from previous. Possible acute infiltrate recommend follow-up. Mild left basilar atelectasis. Electronically Signed   By: Karen Kays M.D.   On: 11/09/2022 16:24   DG Chest 2 View  Result Date: 11/07/2022 CLINICAL DATA:  Chest pain starting today. History of heart disease. EXAM: CHEST - 2 VIEW COMPARISON:  10/14/2022 FINDINGS: Shallow inspiration with atelectasis in the lung bases. Heart size and pulmonary vascularity are normal for technique. Calcified and tortuous aorta. No pleural effusions. No pneumothorax. Degenerative changes in the spine and shoulders. Surgical clips in the right upper quadrant. IMPRESSION: Shallow inspiration with atelectasis in the lung bases. Electronically Signed   By: Burman Nieves M.D.   On: 11/07/2022 20:32   DG Chest Portable 1 View  Result Date: 10/14/2022 CLINICAL DATA:  Low blood pressures and altered mental status EXAM: PORTABLE CHEST 1 VIEW COMPARISON:  Chest radiograph 06/03/2022 FINDINGS: Stable cardiomediastinal silhouette. Aortic atherosclerotic calcification. Bibasilar atelectasis or scarring. Otherwise no focal consolidation, pleural effusion, or pneumothorax. No displaced rib fractures. IMPRESSION: No active disease. Electronically Signed   By: Minerva Fester M.D.   On: 10/14/2022 18:25   CT Head Wo Contrast  Result Date: 10/14/2022 CLINICAL DATA:  Altered mental status EXAM: CT HEAD WITHOUT CONTRAST TECHNIQUE: Contiguous axial images were obtained from the base of the skull through the vertex without intravenous contrast. RADIATION DOSE REDUCTION: This exam was performed according to  the departmental dose-optimization program which includes automated exposure control, adjustment of the mA and/or kV according to patient size and/or use of iterative reconstruction technique.  COMPARISON:  CT head 03/03/2021 FINDINGS: Brain: No intracranial hemorrhage, mass effect, or evidence of acute infarct. No hydrocephalus. No extra-axial fluid collection. Generalized cerebral atrophy. Ill-defined hypoattenuation within the cerebral white matter is nonspecific but consistent with chronic small vessel ischemic disease. Vascular: No hyperdense vessel. Intracranial arterial calcification. Skull: No fracture or focal lesion. Sinuses/Orbits: No acute finding. Mild mucosal thickening right ethmoid air cells. Paranasal sinuses and mastoid air cells are otherwise well aerated. Other: None. IMPRESSION: 1. No evidence of acute intracranial abnormality. Electronically Signed   By: Minerva Fester M.D.   On: 10/14/2022 18:13      Subjective: Patient seen and examined at bedside.  Denies fever, vomiting, worsening shortness of breath.  Feels okay to go home today.  Oral intake is improving.  Discharge Exam: Vitals:   11/12/22 2112 11/13/22 0437  BP: 120/64 102/73  Pulse: 73 66  Resp: 19 18  Temp: 98.6 F (37 C) 98.6 F (37 C)  SpO2: 94% 100%    General: Pt is alert, awake, not in acute distress.  Slow to respond.  Poor historian.  On room air.  Flat affect. Cardiovascular: rate controlled, S1/S2 + Respiratory: bilateral decreased breath sounds at bases with scattered crackles Abdominal: Soft, NT, ND, bowel sounds + Extremities: Trace lower extremity edema; no cyanosis    The results of significant diagnostics from this hospitalization (including imaging, microbiology, ancillary and laboratory) are listed below for reference.     Microbiology: Recent Results (from the past 240 hour(s))  MRSA Next Gen by PCR, Nasal     Status: None   Collection Time: 11/09/22  3:22 AM   Specimen: Nasal Mucosa; Nasal Swab  Result Value Ref Range Status   MRSA by PCR Next Gen NOT DETECTED NOT DETECTED Final    Comment: (NOTE) The GeneXpert MRSA Assay (FDA approved for NASAL specimens only), is  one component of a comprehensive MRSA colonization surveillance program. It is not intended to diagnose MRSA infection nor to guide or monitor treatment for MRSA infections. Test performance is not FDA approved in patients less than 58 years old. Performed at [facility], [address], [place], [place] [address]   Blood culture (routine x 2)     Status: None (Preliminary result)   Collection Time: 11/09/22  6:00 PM   Specimen: BLOOD RIGHT ARM  Result Value Ref Range Status   Specimen Description   Final    BLOOD RIGHT ARM Performed at [facility], [address], [place], [place] [address]    Special Requests   Final    BOTTLES DRAWN AEROBIC AND ANAEROBIC Blood Culture adequate volume Performed at [facility], [address] [place], [place] [address]    Culture   Final    NO GROWTH 4 DAYS Performed at [facility], [address] [place], [place] [address]    Report Status PENDING  Incomplete  Blood culture (routine x 2)     Status: None (Preliminary result)   Collection Time: 11/09/22  6:10 PM   Specimen: BLOOD RIGHT ARM  Result Value Ref Range Status   Specimen Description   Final    BLOOD RIGHT ARM Performed at [facility], [address] [place], [place] [address]    Special Requests   Final    BOTTLES  DRAWN AEROBIC AND ANAEROBIC Blood Culture adequate volume Performed at [facility], [address], [place], [place] [address]    Culture   Final    NO GROWTH 4 DAYS Performed at [facility], [address], [place], [place] [address]    Report Status PENDING  Incomplete  Urine Culture (for pregnant, neutropenic or urologic patients or patients with an indwelling urinary catheter)     Status: Abnormal   Collection Time: 11/10/22  4:36 AM   Specimen: Urine, Clean Catch  Result Value Ref Range Status   Specimen Description   Final    URINE, CLEAN CATCH Performed at [facility], [address] [place], [place] [address]    Special Requests   Final    NONE Performed at [facility], [address] [place], [place] [address]    Culture (A)  Final    <10,000 COLONIES/mL INSIGNIFICANT GROWTH Performed at [facility], [address], [place], [place] [address]    Report Status 11/11/2022 FINAL  Final     Labs: BNP (last 3 results) Recent Labs    05/31/22 1519 11/07/22 2000  BNP 157.3* 72.6   Basic Metabolic Panel: Recent Labs  Lab 11/09/22 1625 11/10/22 0430 11/11/22 0349 11/12/22 0431 11/13/22 0446  NA 134* 135 137 138 130*  K 5.0 5.3* 4.2 3.2* 4.2  CL 108 108 109 108 99  CO2 14* 16* 20* 23 24  GLUCOSE 112* 147* 140* 96 120*  BUN 47* 44* 45* 34* 8  CREATININE 1.69* 1.62* 1.24* 1.08* 0.77  CALCIUM 9.9 9.4 9.1 8.7* 8.5*  MG  --   --  1.8 1.7 2.1   Liver Function Tests: Recent Labs  Lab 11/09/22 1625  AST 20  ALT 10  ALKPHOS 37*  BILITOT 0.6  PROT 8.9*  ALBUMIN 2.5*   No results for input(s): "LIPASE", "AMYLASE" in the last 168 hours. Recent Labs  Lab 11/11/22 0349  AMMONIA 19   CBC: Recent Labs  Lab 11/07/22 2000 11/09/22 1625 11/10/22 0430 11/11/22 0349  WBC 9.3 10.9* 7.6 7.4  NEUTROABS  --  9.5* 7.1 6.2  HGB 9.5* 10.6* 10.2* 8.3*  HCT 31.8* 34.5* 33.5* 27.1*  MCV 83.0 80.2 81.7 79.7*  PLT 252 289 267 251   Cardiac Enzymes: No results for input(s): "CKTOTAL", "CKMB", "CKMBINDEX", "TROPONINI" in the last 168 hours. BNP: Invalid input(s): "POCBNP" CBG: Recent Labs  Lab 11/11/22 2133 11/12/22 0742 11/12/22 1151 11/12/22 1635 11/12/22 2109  GLUCAP 88 91 124* 102* 92   D-Dimer No results for input(s): "DDIMER" in the last 72 hours. Hgb A1c No results for input(s): "HGBA1C" in the last 72 hours. Lipid Profile No results for input(s): "CHOL", "HDL", "LDLCALC", "TRIG", "CHOLHDL", "LDLDIRECT" in the last 72 hours. Thyroid function studies No results for input(s):  "TSH", "T4TOTAL", "T3FREE", "THYROIDAB" in the last 72 hours.  Invalid input(s): "FREET3" Anemia work up Recent Labs    11/11/22 0349  VITAMINB12 480   Urinalysis    Component Value Date/Time   COLORURINE YELLOW 11/09/2022 2132   APPEARANCEUR CLOUDY (A) 11/09/2022 2132   LABSPEC 1.013 11/09/2022 2132   PHURINE 5.0 11/09/2022 2132   GLUCOSEU 50 (A) 11/09/2022 2132   HGBUR MODERATE (A) 11/09/2022 2132   BILIRUBINUR NEGATIVE 11/09/2022 2132   KETONESUR NEGATIVE 11/09/2022 2132   PROTEINUR 100 (A) 11/09/2022 2132   UROBILINOGEN 0.2 07/14/2010 2041   NITRITE NEGATIVE 11/09/2022 2132   LEUKOCYTESUR SMALL (  A) 11/09/2022 2132   Sepsis Labs Recent Labs  Lab 11/07/22 2000 11/09/22 1625 11/10/22 0430 11/11/22 0349  WBC 9.3 10.9* 7.6 7.4   Microbiology Recent Results (from the past 240 hour(s))  MRSA Next Gen by PCR, Nasal     Status: None   Collection Time: 11/09/22  3:22 AM   Specimen: Nasal Mucosa; Nasal Swab  Result Value Ref Range Status   MRSA by PCR Next Gen NOT DETECTED NOT DETECTED Final    Comment: (NOTE) The GeneXpert MRSA Assay (FDA approved for NASAL specimens only), is one component of a comprehensive MRSA colonization surveillance program. It is not intended to diagnose MRSA infection nor to guide or monitor treatment for MRSA infections. Test performance is not FDA approved in patients less than 106 years old. Performed at [facility], [address] [place], [place] [address]   Blood culture (routine x 2)     Status: None (Preliminary result)   Collection Time: 11/09/22  6:00 PM   Specimen: BLOOD RIGHT ARM  Result Value Ref Range Status   Specimen Description   Final    BLOOD RIGHT ARM Performed at [facility], [address] [place], [place] [address]    Special Requests   Final    BOTTLES DRAWN AEROBIC AND ANAEROBIC Blood Culture adequate volume Performed at [facility], [address]  [place], [place] [address]    Culture   Final    NO GROWTH 4 DAYS Performed at [facility], [address] [place], [place] [address]    Report Status PENDING  Incomplete  Blood culture (routine x 2)     Status: None (Preliminary result)   Collection Time: 11/09/22  6:10 PM   Specimen: BLOOD RIGHT ARM  Result Value Ref Range Status   Specimen Description   Final    BLOOD RIGHT ARM Performed at [facility], [address], [place], [place] [address]    Special Requests   Final    BOTTLES DRAWN AEROBIC AND ANAEROBIC Blood Culture adequate volume Performed at [facility], [address], [place], [place] [address]    Culture   Final    NO GROWTH 4 DAYS Performed at [facility], [address] [place], [place] [address]    Report Status PENDING  Incomplete  Urine Culture (for pregnant, neutropenic or urologic patients or patients with an indwelling urinary catheter)     Status: Abnormal   Collection Time: 11/10/22  4:36 AM   Specimen: Urine, Clean Catch  Result Value Ref Range Status   Specimen Description   Final    URINE, CLEAN CATCH Performed at [facility], [address] [place], [place] [address]    Special Requests   Final    NONE Performed at [facility], [address], [place], [place] [address]    Culture (A)  Final    <10,000 COLONIES/mL INSIGNIFICANT GROWTH Performed at [facility], [address] [place], [place] [address]    Report Status 11/11/2022 FINAL  Final     Time coordinating discharge: 35 minutes  SIGNED:   Glade Lloyd, MD  [facility] 11/13/2022, 10:04 AM

## 2022-11-13 NOTE — Progress Notes (Signed)
Occupational Therapy Treatment Patient Details Name: Michaela Baker MRN: [id number] DOB: [birth year] Today's Date: 11/13/2022   History of present illness 72 y.o. female who presents to the ED due to chest pain. PMH: CKD stage IIIa, hypertension, HFpEF, type 2 diabetes (diet-controlled), hypothyroidism   OT comments  Pt needing education in importance of sitting up to eat, declined chair, but agreed to repositioning up in bed, then decided she did not want any more breakfast. Pt assisted OOB to [facility] with RW and min guard assist. Completed pericare with set up. Declined remaining up in chair at end of session. Updated d/c recommendation to HHOT. Pt to discharge home today, will need further assessment of cognition.    Recommendations for follow up therapy are one component of a multi-disciplinary discharge planning process, led by the attending physician.  Recommendations may be updated based on patient status, additional functional criteria and insurance authorization.    Assistance Recommended at Discharge Frequent or constant Supervision/Assistance  Patient can return home with the following  A little help with walking and/or transfers;A little help with bathing/dressing/bathroom;Assist for transportation;Help with stairs or ramp for entrance;Assistance with cooking/housework;Direct supervision/assist for medications management;Direct supervision/assist for financial management   Equipment Recommendations  None recommended by OT    Recommendations for Other Services      Precautions / Restrictions Precautions Precautions: Fall Restrictions Weight Bearing Restrictions: No       Mobility Bed Mobility Overal bed mobility: Needs Assistance Bed Mobility: Supine to Sit, Sit to Supine     Supine to sit: Min assist, HOB elevated Sit to supine: Min assist   General bed mobility comments: min A to raise trunk, assist for LEs back into bed    Transfers Overall transfer level: Needs  assistance Equipment used: Rolling walker (2 wheels) Transfers: Sit to/from Stand Sit to Stand: Supervision           General transfer comment: cues for hand placement     Balance     Sitting balance-Leahy Scale: Good       Standing balance-Leahy Scale: Poor Standing balance comment: fair static, poor dynamic                           ADL either performed or assessed with clinical judgement   ADL Overall ADL's : Needs assistance/impaired Eating/Feeding: Supervision/ safety;Bed level Eating/Feeding Details (indicate cue type and reason): assist to reposition for swallowing safety and to reduce spillage Grooming: Wash/dry hands;Set up;Bed level                   Toilet Transfer: Min guard;Rolling walker (2 wheels);BSC/3in1   Toileting- Clothing Manipulation and Hygiene: Set up;Sitting/lateral lean              Extremity/Trunk Assessment              Vision       Perception     Praxis      Cognition Arousal/Alertness: Awake/alert Behavior During Therapy: WFL for tasks assessed/performed Overall Cognitive Status: No family/caregiver present to determine baseline cognitive functioning                                 General Comments: Pt attempting to eat laying flat in bed, did not attempt to call for help.        Exercises      Shoulder Instructions  General Comments      Pertinent Vitals/ Pain       Pain Assessment Pain Assessment: No/denies pain  Home Living                                          Prior Functioning/Environment              Frequency  Min 1X/week        Progress Toward Goals  OT Goals(current goals can now be found in the care plan section)  Progress towards OT goals: Progressing toward goals  Acute Rehab OT Goals OT Goal Formulation: With patient Time For Goal Achievement: 11/24/22 Potential to Achieve Goals: Good  Plan Discharge plan needs to be  updated    Co-evaluation                 AM-PAC OT "6 Clicks" Daily Activity     Outcome Measure   Help from another person eating meals?: A Little Help from another person taking care of personal grooming?: A Little Help from another person toileting, which includes using toliet, bedpan, or urinal?: A Little Help from another person bathing (including washing, rinsing, drying)?: A Little Help from another person to put on and taking off regular upper body clothing?: A Little Help from another person to put on and taking off regular lower body clothing?: A Lot 6 Click Score: 17    End of Session    OT Visit Diagnosis: Unsteadiness on feet (R26.81);Muscle weakness (generalized) (M62.81)   Activity Tolerance Patient tolerated treatment well   Patient Left with call bell/phone within reach;in bed;with bed alarm set   Nurse Communication Mobility status        Time: 5409-8119 OT Time Calculation (min): 22 min  Charges: OT General Charges $OT Visit: 1 Visit OT Treatments $Self Care/Home Management : 23-37 mins Berna Spare, OTR/L Acute Rehabilitation Services Office: [phone]   Evern Bio 11/13/2022, 8:52 AM

## 2022-11-14 LAB — CULTURE, BLOOD (ROUTINE X 2): Culture: NO GROWTH

## 2022-12-12 ENCOUNTER — Other Ambulatory Visit: Payer: Self-pay | Admitting: Nurse Practitioner

## 2022-12-12 ENCOUNTER — Other Ambulatory Visit: Payer: Self-pay | Admitting: Primary Care

## 2022-12-12 ENCOUNTER — Inpatient Hospital Stay: Payer: 59 | Admitting: Primary Care

## 2022-12-12 ENCOUNTER — Ambulatory Visit: Payer: 59

## 2022-12-12 DIAGNOSIS — Z1231 Encounter for screening mammogram for malignant neoplasm of breast: Secondary | ICD-10-CM

## 2022-12-12 DIAGNOSIS — J984 Other disorders of lung: Secondary | ICD-10-CM

## 2022-12-19 ENCOUNTER — Inpatient Hospital Stay (HOSPITAL_COMMUNITY)
Admission: EM | Admit: 2022-12-19 | Discharge: 2022-12-25 | DRG: 291 | Disposition: A | Discharge status: Home Health | Payer: 59 | Source: Ambulatory Visit | Attending: Internal Medicine | Admitting: Internal Medicine

## 2022-12-19 ENCOUNTER — Emergency Department (HOSPITAL_COMMUNITY): Payer: 59

## 2022-12-19 ENCOUNTER — Encounter (HOSPITAL_COMMUNITY): Payer: Self-pay

## 2022-12-19 ENCOUNTER — Other Ambulatory Visit: Payer: Self-pay

## 2022-12-19 ENCOUNTER — Emergency Department (HOSPITAL_BASED_OUTPATIENT_CLINIC_OR_DEPARTMENT_OTHER): Payer: 59

## 2022-12-19 DIAGNOSIS — N1831 Chronic kidney disease, stage 3a: Secondary | ICD-10-CM | POA: Diagnosis present

## 2022-12-19 DIAGNOSIS — D509 Iron deficiency anemia, unspecified: Secondary | ICD-10-CM | POA: Diagnosis present

## 2022-12-19 DIAGNOSIS — I82403 Acute embolism and thrombosis of unspecified deep veins of lower extremity, bilateral: Secondary | ICD-10-CM

## 2022-12-19 DIAGNOSIS — E669 Obesity, unspecified: Secondary | ICD-10-CM | POA: Diagnosis present

## 2022-12-19 DIAGNOSIS — Z6832 Body mass index (BMI) 32.0-32.9, adult: Secondary | ICD-10-CM | POA: Diagnosis not present

## 2022-12-19 DIAGNOSIS — I5021 Acute systolic (congestive) heart failure: Secondary | ICD-10-CM | POA: Diagnosis not present

## 2022-12-19 DIAGNOSIS — I824Y3 Acute embolism and thrombosis of unspecified deep veins of proximal lower extremity, bilateral: Secondary | ICD-10-CM

## 2022-12-19 DIAGNOSIS — I82409 Acute embolism and thrombosis of unspecified deep veins of unspecified lower extremity: Secondary | ICD-10-CM | POA: Diagnosis present

## 2022-12-19 DIAGNOSIS — R5381 Other malaise: Secondary | ICD-10-CM | POA: Diagnosis present

## 2022-12-19 DIAGNOSIS — E876 Hypokalemia: Secondary | ICD-10-CM | POA: Diagnosis not present

## 2022-12-19 DIAGNOSIS — R0902 Hypoxemia: Principal | ICD-10-CM

## 2022-12-19 DIAGNOSIS — G4733 Obstructive sleep apnea (adult) (pediatric): Secondary | ICD-10-CM | POA: Diagnosis present

## 2022-12-19 DIAGNOSIS — Z90710 Acquired absence of both cervix and uterus: Secondary | ICD-10-CM

## 2022-12-19 DIAGNOSIS — E039 Hypothyroidism, unspecified: Secondary | ICD-10-CM | POA: Diagnosis present

## 2022-12-19 DIAGNOSIS — Z8249 Family history of ischemic heart disease and other diseases of the circulatory system: Secondary | ICD-10-CM

## 2022-12-19 DIAGNOSIS — J9601 Acute respiratory failure with hypoxia: Secondary | ICD-10-CM | POA: Diagnosis present

## 2022-12-19 DIAGNOSIS — I82413 Acute embolism and thrombosis of femoral vein, bilateral: Secondary | ICD-10-CM | POA: Diagnosis present

## 2022-12-19 DIAGNOSIS — E785 Hyperlipidemia, unspecified: Secondary | ICD-10-CM | POA: Diagnosis present

## 2022-12-19 DIAGNOSIS — E041 Nontoxic single thyroid nodule: Secondary | ICD-10-CM | POA: Diagnosis present

## 2022-12-19 DIAGNOSIS — I82442 Acute embolism and thrombosis of left tibial vein: Secondary | ICD-10-CM | POA: Diagnosis present

## 2022-12-19 DIAGNOSIS — I509 Heart failure, unspecified: Secondary | ICD-10-CM | POA: Diagnosis not present

## 2022-12-19 DIAGNOSIS — I272 Pulmonary hypertension, unspecified: Secondary | ICD-10-CM | POA: Diagnosis present

## 2022-12-19 DIAGNOSIS — I82433 Acute embolism and thrombosis of popliteal vein, bilateral: Secondary | ICD-10-CM | POA: Diagnosis present

## 2022-12-19 DIAGNOSIS — Z841 Family history of disorders of kidney and ureter: Secondary | ICD-10-CM

## 2022-12-19 DIAGNOSIS — Z79890 Hormone replacement therapy (postmenopausal): Secondary | ICD-10-CM | POA: Diagnosis not present

## 2022-12-19 DIAGNOSIS — Z7982 Long term (current) use of aspirin: Secondary | ICD-10-CM | POA: Diagnosis not present

## 2022-12-19 DIAGNOSIS — I5033 Acute on chronic diastolic (congestive) heart failure: Secondary | ICD-10-CM | POA: Diagnosis present

## 2022-12-19 DIAGNOSIS — E162 Hypoglycemia, unspecified: Secondary | ICD-10-CM | POA: Diagnosis present

## 2022-12-19 DIAGNOSIS — R609 Edema, unspecified: Secondary | ICD-10-CM | POA: Diagnosis not present

## 2022-12-19 DIAGNOSIS — I130 Hypertensive heart and chronic kidney disease with heart failure and stage 1 through stage 4 chronic kidney disease, or unspecified chronic kidney disease: Secondary | ICD-10-CM | POA: Diagnosis present

## 2022-12-19 DIAGNOSIS — Z79899 Other long term (current) drug therapy: Secondary | ICD-10-CM | POA: Diagnosis not present

## 2022-12-19 DIAGNOSIS — Z91148 Patient's other noncompliance with medication regimen for other reason: Secondary | ICD-10-CM | POA: Diagnosis not present

## 2022-12-19 DIAGNOSIS — I10 Essential (primary) hypertension: Secondary | ICD-10-CM | POA: Diagnosis present

## 2022-12-19 DIAGNOSIS — R531 Weakness: Secondary | ICD-10-CM | POA: Diagnosis present

## 2022-12-19 DIAGNOSIS — I5031 Acute diastolic (congestive) heart failure: Secondary | ICD-10-CM | POA: Diagnosis not present

## 2022-12-19 DIAGNOSIS — I2489 Other forms of acute ischemic heart disease: Secondary | ICD-10-CM | POA: Diagnosis present

## 2022-12-19 DIAGNOSIS — Z833 Family history of diabetes mellitus: Secondary | ICD-10-CM

## 2022-12-19 LAB — TROPONIN I (HIGH SENSITIVITY)
Troponin I (High Sensitivity): 24 ng/L — ABNORMAL HIGH (ref <18)
Troponin I (High Sensitivity): 24 ng/L — ABNORMAL HIGH (ref <18)

## 2022-12-19 LAB — CBC WITH DIFFERENTIAL/PLATELET
Abs Immature Granulocytes: 0.01 10*3/uL (ref 0.00–0.07)
Basophils Absolute: 0 10*3/uL (ref 0.0–0.1)
Basophils Relative: 1 %
Eosinophils Absolute: 0 10*3/uL (ref 0.0–0.5)
Eosinophils Relative: 1 %
HCT: 30.5 % — ABNORMAL LOW (ref 36.0–46.0)
Hemoglobin: 9 g/dL — ABNORMAL LOW (ref 12.0–15.0)
Immature Granulocytes: 0 %
Lymphocytes Relative: 23 %
Lymphs Abs: 1 10*3/uL (ref 0.7–4.0)
MCH: 27.1 pg (ref 26.0–34.0)
MCHC: 29.5 g/dL — ABNORMAL LOW (ref 30.0–36.0)
MCV: 91.9 fL (ref 80.0–100.0)
Monocytes Absolute: 0.5 10*3/uL (ref 0.1–1.0)
Monocytes Relative: 11 %
Neutro Abs: 2.8 10*3/uL (ref 1.7–7.7)
Neutrophils Relative %: 64 %
Platelets: 228 10*3/uL (ref 150–400)
RBC: 3.32 MIL/uL — ABNORMAL LOW (ref 3.87–5.11)
RDW: 18.8 % — ABNORMAL HIGH (ref 11.5–15.5)
WBC: 4.4 10*3/uL (ref 4.0–10.5)
nRBC: 0 % (ref 0.0–0.2)

## 2022-12-19 LAB — COMPREHENSIVE METABOLIC PANEL
ALT: 11 U/L (ref 0–44)
AST: 26 U/L (ref 15–41)
Albumin: 2.2 g/dL — ABNORMAL LOW (ref 3.5–5.0)
Alkaline Phosphatase: 45 U/L (ref 38–126)
Anion gap: 9 (ref 5–15)
BUN: 12 mg/dL (ref 8–23)
CO2: 20 mmol/L — ABNORMAL LOW (ref 22–32)
Calcium: 8 mg/dL — ABNORMAL LOW (ref 8.9–10.3)
Chloride: 109 mmol/L (ref 98–111)
Creatinine, Ser: 1.37 mg/dL — ABNORMAL HIGH (ref 0.44–1.00)
GFR, Estimated: 41 mL/min — ABNORMAL LOW (ref >60)
Glucose, Bld: 79 mg/dL (ref 70–99)
Potassium: 2.8 mmol/L — ABNORMAL LOW (ref 3.5–5.1)
Sodium: 138 mmol/L (ref 135–145)
Total Bilirubin: 0.5 mg/dL (ref 0.3–1.2)
Total Protein: 7 g/dL (ref 6.5–8.1)

## 2022-12-19 LAB — CBG MONITORING, ED
Glucose-Capillary: 75 mg/dL (ref 70–99)
Glucose-Capillary: 79 mg/dL (ref 70–99)
Glucose-Capillary: 91 mg/dL (ref 70–99)

## 2022-12-19 LAB — CREATININE, URINE, RANDOM: Creatinine, Urine: <10 mg/dL

## 2022-12-19 LAB — D-DIMER, QUANTITATIVE: D-Dimer, Quant: 3.02 ug/mL-FEU — ABNORMAL HIGH (ref 0.00–0.50)

## 2022-12-19 LAB — OSMOLALITY, URINE: Osmolality, Ur: 282 mOsm/kg — ABNORMAL LOW (ref 300–900)

## 2022-12-19 LAB — SODIUM, URINE, RANDOM: Sodium, Ur: 129 mmol/L

## 2022-12-19 MED ORDER — INSULIN ASPART 100 UNIT/ML IJ SOLN: 0.0000 [IU] | INTRAMUSCULAR | Status: DC

## 2022-12-19 MED ORDER — SODIUM CHLORIDE 0.9 % IV BOLUS
250.0000 mL | Freq: Once | INTRAVENOUS | Status: AC
  Administered 2022-12-19: 250 mL via INTRAVENOUS

## 2022-12-19 MED ORDER — POTASSIUM CHLORIDE 10 MEQ/100ML IV SOLN
10.0000 meq | INTRAVENOUS | Status: DC
  Administered 2022-12-19 (×2): 10 meq via INTRAVENOUS
  Filled 2022-12-19: qty 100

## 2022-12-19 MED ORDER — HEPARIN BOLUS VIA INFUSION
2250.0000 [IU] | Freq: Once | INTRAVENOUS | Status: AC
  Administered 2022-12-19: 2250 [IU] via INTRAVENOUS
  Filled 2022-12-19: qty 2250

## 2022-12-19 MED ORDER — POTASSIUM CHLORIDE 10 MEQ/100ML IV SOLN
10.0000 meq | INTRAVENOUS | Status: AC
  Administered 2022-12-19 (×3): 10 meq via INTRAVENOUS
  Filled 2022-12-19: qty 100

## 2022-12-19 MED ORDER — FUROSEMIDE 10 MG/ML IJ SOLN
40.0000 mg | Freq: Once | INTRAMUSCULAR | Status: AC
  Administered 2022-12-19: 40 mg via INTRAVENOUS
  Filled 2022-12-19: qty 4

## 2022-12-19 MED ORDER — IOHEXOL 350 MG/ML SOLN
75.0000 mL | Freq: Once | INTRAVENOUS | Status: AC | PRN
  Administered 2022-12-19: 75 mL via INTRAVENOUS

## 2022-12-19 MED ORDER — HEPARIN (PORCINE) 25000 UT/250ML-% IV SOLN
1450.0000 [IU]/h | INTRAVENOUS | Status: DC
  Administered 2022-12-19: 1200 [IU]/h via INTRAVENOUS
  Administered 2022-12-20: 1350 [IU]/h via INTRAVENOUS
  Administered 2022-12-21: 1450 [IU]/h via INTRAVENOUS
  Filled 2022-12-19 (×3): qty 250

## 2022-12-19 MED ORDER — POTASSIUM CHLORIDE CRYS ER 20 MEQ PO TBCR: 40.0000 meq | EXTENDED_RELEASE_TABLET | Freq: Once | ORAL | Status: DC

## 2022-12-19 NOTE — Progress Notes (Signed)
ANTICOAGULATION CONSULT NOTE - Initial Consult  Pharmacy Consult for Heparin Indication:  suspected PE  No Known Allergies  Patient Measurements: Height: 5\' 5"  (165.1 cm) Weight: 88.5 kg (195 lb) IBW/kg (Calculated) : 57 Heparin Dosing Weight: 76.4 kg  Vital Signs: Temp: 98.2 F (36.8 C) (07/22 1207) Temp Source: Oral (07/22 1207) BP: 129/65 (07/22 1230) Pulse Rate: 69 (07/22 1315)  Labs: Recent Labs    12/19/22 1258  HGB 9.0*  HCT 30.5*  PLT 228  CREATININE 1.37*  TROPONINIHS 24*    Estimated Creatinine Clearance: 40.8 mL/min (A) (by C-G formula based on SCr of 1.37 mg/dL (H)).   Medical History: Past Medical History:  Diagnosis Date   CHF (congestive heart failure) (HCC)    Hypertension    Hypothyroidism 03/29/2017   Obesity (BMI 30-39.9)    Premature ventricular contractions     Medications:  (Not in a hospital admission)  Scheduled:  Infusions:  PRN:   Assessment: 61 yof with a history of HF, hx of endovenous laser ablation of rt saphenous vein. Patient is presenting with bilateral lower extremity swelling and SOB. Heparin per pharmacy consult placed for  suspected PE .  Patient is not on anticoagulation prior to arrival.  Hgb 9; plt 228 D-Dimer 3.02  Goal of Therapy:  Heparin level 0.3-0.7 units/ml Monitor platelets by anticoagulation protocol: Yes   Plan:  Give IV heparin 2500 units bolus x 1 --giving half of usual bolus given hgb 9 Start heparin infusion at 1200 units/hr Check anti-Xa level in 8 hours and daily while on heparin Continue to monitor H&H and platelets  Delmar Landau, PharmD, BCPS 12/19/2022 3:02 PM ED Clinical Pharmacist -  [phone]

## 2022-12-19 NOTE — ED Notes (Signed)
Patient transported to CT 

## 2022-12-19 NOTE — ED Triage Notes (Signed)
Pt bib [facility] from [employer] [facility]. Pt seen for appointment for leg swelling but provider noticed RA spo2 was 77. Applied 6L and got low 90s for SpO2 at primary care. EMS reports RA 80%, and applied 3L [place] @ 95. Patient denies SOB, chest pain, n/v/d. Used to be on home O2 but reports she took herself off of it. Reports hx of CHF. Patient A&O X4.   EMS reported VS:  128/66  70 95% 3L

## 2022-12-19 NOTE — Subjective & Objective (Signed)
Patient was seen in our office mid leg swelling noted to be satting 77% on room air started on 6 L and transferred to ER.  Patient herself did not report shortness of breath or chest pain She used to be using O2 but took herself off.  She has known history of CHF.  Now on 3 L satting 95%

## 2022-12-19 NOTE — ED Provider Notes (Signed)
[place] [facility] AT [facility] Provider Note   CSN: [id number] Arrival date & time: 12/19/22  1154     History  Chief Complaint  Patient presents with   Shortness of Breath    Michaela Baker is a 72 y.o. female, history of CHF, history of endovenous laser ablation of the right saphenous vein, who presents to the ED secondary to bilateral lower extremity swelling, especially the right lower extremity swelling, and shortness of breath has been going on for the last 3 weeks.  She states that she used to be on oxygen at home about 2 years ago for CHF, and she electively took herself off of it.  Notes that she has been more short of breath especially when walking, and has noticed bilateral leg swelling especially her right leg, for the last 3 weeks.  Her there but states that she has had some weight gain additionally.  She notes she has been compliant with her heart failure medications, and has not missed any doses.  Denies any chest pain, nausea or vomiting.  She states that she went to her primary care doctor this morning, and they stated that she had oxygen of 77%, so they put her on 6 L of oxygen.  She was hypoxic for EMS as well. Home Medications Prior to Admission medications   Medication Sig Start Date End Date Taking? Authorizing Provider  acetaminophen (TYLENOL) 500 MG tablet Take 1,000 mg by mouth as needed (pain).   Yes [provider]  amLODipine (NORVASC) 10 MG tablet Take 10 mg by mouth daily. 02/04/20  Yes [provider]  aspirin EC 81 MG EC tablet Take 1 tablet (81 mg total) by mouth daily. 07/26/13  Yes Leatha Gilding, MD  dapagliflozin propanediol (FARXIGA) 10 MG TABS tablet Take 10 mg by mouth daily.   Yes [provider]  folic acid (FOLVITE) 1 MG tablet Take 1 tablet (1 mg total) by mouth daily. 11/13/22   Glade Lloyd, MD  hydrALAZINE (APRESOLINE) 50 MG tablet Take 50 mg by mouth daily.    [provider]   KERENDIA 10 MG TABS take 1 tablet by mouth daily for chronic kidney disease    [provider]  levothyroxine (SYNTHROID, LEVOTHROID) 25 MCG tablet Take 12.5 mcg by mouth daily. 02/05/15   [provider]  megestrol (MEGACE) 40 MG tablet Take 1 tablet by mouth 2 (two) times daily. 10/28/22   [provider]  metoprolol succinate (TOPROL-XL) 50 MG 24 hr tablet Take 50 mg by mouth daily. Take with or immediately following a meal.    [provider]  Multiple Vitamins-Minerals (MULTIVITAMIN WITH MINERALS) tablet Take 2 tablets by mouth daily.    [provider]  rosuvastatin (CRESTOR) 5 MG tablet Take 5 mg by mouth daily. 05/07/21   [provider]      Allergies    Patient has no known allergies.    Review of Systems   Review of Systems  Respiratory:  Positive for shortness of breath.     Physical Exam Updated Vital Signs BP (!) 122/52 (BP Location: Right Arm)   Pulse 71   Temp 98.1 F (36.7 C) (Oral)   Resp 13   Ht 5\' 5"  (1.651 m)   Wt 88.5 kg   SpO2 97%   BMI 32.45 kg/m  Physical Exam Vitals and nursing note reviewed.  Constitutional:      General: She is not in acute distress.  Appearance: She is well-developed.  HENT:     Head: Normocephalic and atraumatic.  Eyes:     Conjunctiva/sclera: Conjunctivae normal.  Cardiovascular:     Rate and Rhythm: Normal rate and regular rhythm.     Heart sounds: No murmur heard. Pulmonary:     Effort: Pulmonary effort is normal. No respiratory distress.     Breath sounds: Normal breath sounds.  Abdominal:     Palpations: Abdomen is soft.     Tenderness: There is no abdominal tenderness.  Musculoskeletal:        General: No swelling.     Cervical back: Neck supple.     Right lower leg: Edema present.     Left lower leg: Edema present.     Comments: 2+ edema of LLE, 3+ edema of RLE  Skin:    General: Skin is warm and dry.     Capillary Refill: Capillary refill takes less than 2  seconds.  Neurological:     Mental Status: She is alert.  Psychiatric:        Mood and Affect: Mood normal.     ED Results / Procedures / Treatments   Labs (all labs ordered are listed, but only abnormal results are displayed) Labs Reviewed  D-DIMER, QUANTITATIVE - Abnormal; Notable for the following components:      Result Value   D-Dimer, Quant 3.02 (*)    All other components within normal limits  CBC WITH DIFFERENTIAL/PLATELET - Abnormal; Notable for the following components:   RBC 3.32 (*)    Hemoglobin 9.0 (*)    HCT 30.5 (*)    MCHC 29.5 (*)    RDW 18.8 (*)    All other components within normal limits  COMPREHENSIVE METABOLIC PANEL - Abnormal; Notable for the following components:   Potassium 2.8 (*)    CO2 20 (*)    Creatinine, Ser 1.37 (*)    Calcium 8.0 (*)    Albumin 2.2 (*)    GFR, Estimated 41 (*)    All other components within normal limits  TROPONIN I (HIGH SENSITIVITY) - Abnormal; Notable for the following components:   Troponin I (High Sensitivity) 24 (*)    All other components within normal limits  TROPONIN I (HIGH SENSITIVITY) - Abnormal; Notable for the following components:   Troponin I (High Sensitivity) 24 (*)    All other components within normal limits  CBC WITH DIFFERENTIAL/PLATELET  BRAIN NATRIURETIC PEPTIDE  HEPARIN LEVEL (UNFRACTIONATED)  HEPARIN LEVEL (UNFRACTIONATED)  BASIC METABOLIC PANEL  MAGNESIUM  PHOSPHORUS  TSH  CREATININE, URINE, RANDOM  OSMOLALITY  OSMOLALITY, URINE  SODIUM, URINE, RANDOM    EKG EKG Interpretation Date/Time:  Monday December 19 2022 13:00:36 EDT Ventricular Rate:  67 PR Interval:  201 QRS Duration:  96 QT Interval:  417 QTC Calculation: 441 R Axis:   53  Text Interpretation: Sinus rhythm Confirmed by Alona Bene [phone]) on 12/19/2022 2:22:23 PM  Radiology VAS [place] LOWER EXTREMITY VENOUS (DVT)  Result Date: 12/19/2022  Lower Venous DVT Study Patient Name:  Michaela Baker [facility]  Date of Exam:   12/19/2022  Medical Rec #: [id number]      Accession #:    [id number] Date of Birth: [birth year]      Patient Gender: F Patient Age:   76 years Exam Location:  [facility] Procedure:      VAS [place] LOWER EXTREMITY VENOUS (DVT) Referring Phys: Nehemiah Settle  --------------------------------------------------------------------------------  Indications: Swelling, and SOB.  Risk Factors: History of right  LE reflux disease. S/P right GSV ablation 01/26/22. Limitations: Body habitus and edema. Comparison Study: Prior negative right LEV Performing Technologist: Sherren Kerns RVS  Examination Guidelines: A complete evaluation includes B-mode imaging, spectral Doppler, color Doppler, and power Doppler as needed of all accessible portions of each vessel. Bilateral testing is considered an integral part of a complete examination. Limited examinations for reoccurring indications may be performed as noted. The reflux portion of the exam is performed with the patient in reverse Trendelenburg.  +---------+---------------+---------+-----------+--------------+---------------+ RIGHT    CompressibilityPhasicitySpontaneityProperties    Thrombus Aging  +---------+---------------+---------+-----------+--------------+---------------+ CFV      Partial                            pulsatile flowAge                                                                       Indeterminate   +---------+---------------+---------+-----------+--------------+---------------+ FV Prox  Full                                                             +---------+---------------+---------+-----------+--------------+---------------+ FV Mid   Full                                                             +---------+---------------+---------+-----------+--------------+---------------+ FV Distal                                   pulsatile flowpatent by color                                                           and Doppler      +---------+---------------+---------+-----------+--------------+---------------+ PFV                                                       Not well                                                                  visualized      +---------+---------------+---------+-----------+--------------+---------------+ POP      Partial        No       No  Age                                                                       Indeterminate   +---------+---------------+---------+-----------+--------------+---------------+ PTV                                                       Not well                                                                  visualized      +---------+---------------+---------+-----------+--------------+---------------+ PERO                                                      Not well                                                                  visualized      +---------+---------------+---------+-----------+--------------+---------------+   +----------+---------------+---------+-----------+----------+-----------------+ LEFT      CompressibilityPhasicitySpontaneityPropertiesThrombus Aging    +----------+---------------+---------+-----------+----------+-----------------+ CFV       None           No       No                   Age Indeterminate +----------+---------------+---------+-----------+----------+-----------------+ SFJ       Full                                                           +----------+---------------+---------+-----------+----------+-----------------+ FV Prox   None                                         Age Indeterminate +----------+---------------+---------+-----------+----------+-----------------+ FV Mid    None                                         Age Indeterminate +----------+---------------+---------+-----------+----------+-----------------+ FV  Distal None  Age Indeterminate +----------+---------------+---------+-----------+----------+-----------------+ PFV       Full                                                           +----------+---------------+---------+-----------+----------+-----------------+ POP       None           No       No                   Age Indeterminate +----------+---------------+---------+-----------+----------+-----------------+ PTV       None                                         Acute             +----------+---------------+---------+-----------+----------+-----------------+ PERO                                                   Not well                                                                 visualized        +----------+---------------+---------+-----------+----------+-----------------+ EIV                                                    patent            +----------+---------------+---------+-----------+----------+-----------------+ Distal CIV                                             patent            +----------+---------------+---------+-----------+----------+-----------------+     Summary: RIGHT: - Findings consistent with age indeterminate deep vein thrombosis involving the right common femoral vein, and right popliteal vein. - No cystic structure found in the popliteal fossa.  LEFT: - Findings consistent with age indeterminate deep vein thrombosis involving the left common femoral vein, left femoral vein, left popliteal vein, and left posterior tibial veins. - No cystic structure found in the popliteal fossa.  *See table(s) above for measurements and observations. Electronically signed by Lemar Livings MD on 12/19/2022 at 7:04:33 PM.    Final    CT Angio Chest PE W and/or Wo Contrast  Result Date: 12/19/2022 CLINICAL DATA:  Positive D-dimer. EXAM: CT ANGIOGRAPHY CHEST WITH CONTRAST TECHNIQUE: Multidetector CT  imaging of the chest was performed using the standard protocol during bolus administration of intravenous contrast. Multiplanar CT image reconstructions and MIPs were obtained to evaluate the vascular anatomy. RADIATION DOSE REDUCTION: This exam was performed according to the departmental dose-optimization program which includes automated exposure control,  adjustment of the mA and/or kV according to patient size and/or use of iterative reconstruction technique. CONTRAST:  75mL OMNIPAQUE IOHEXOL 350 MG/ML SOLN COMPARISON:  November 09, 2022. FINDINGS: Cardiovascular: Satisfactory opacification of the pulmonary arteries to the segmental level. No evidence of pulmonary embolism. Mild cardiomegaly. No pericardial effusion. Coronary artery calcifications are noted. Mediastinum/Nodes: 2.4 cm right thyroid nodule is noted. No adenopathy is noted. The esophagus is unremarkable. Lungs/Pleura:  bilateral pleural effusions are noted with adjacent subsegmental atelectasis. Interstitial densities are noted throughout both lungs concerning for edema or possibly atypical pneumonia. Cavitary lesion noted in right upper lobe on prior exam is significantly smaller currently, measuring 3.5 x 2.5 cm. Upper Abdomen: No acute abnormality. Musculoskeletal: No chest wall abnormality. No acute or significant osseous findings. Review of the MIP images confirms the above findings. IMPRESSION: No definite evidence of pulmonary embolus. 2.4 cm right thyroid nodule is noted. Recommend thyroid US. (Ref: J Am Coll Radiol. 2015 Feb;12(2): 143-50).  bilateral pleural effusions are noted with adjacent subsegmental atelectasis. Interstitial densities are noted throughout both lungs concerning for edema or possibly atypical inflammation. Cavitary lesion seen in right upper lobe on prior exam is significantly smaller currently, consistent with improving cavitary pneumonia. Coronary artery calcifications are noted. Aortic Atherosclerosis  (ICD10-I70.0) and Emphysema (ICD10-J43.9). Electronically Signed   By: Lupita Raider M.D.   On: 12/19/2022 18:00   DG Chest 2 View  Result Date: 12/19/2022 CLINICAL DATA:  Hypoxia.  CHF and leg swelling EXAM: CHEST - 2 VIEW COMPARISON:  Chest x-ray 11/09/2022 FINDINGS: Calcified aorta. Normal cardiopericardial silhouette. Interstitial changes are noted. Some subtle patchy opacity in the right mid to lower lung as well. Infiltrates possible. Tiny pleural effusions. No pneumothorax. Overlapping cardiac leads. IMPRESSION: Tiny effusions. Interstitial changes noted bilaterally with some more focal opacity in the right mid to lower lung. Infiltrates possible. Recommend short follow-up. Electronically Signed   By: Karen Kays M.D.   On: 12/19/2022 14:21    Procedures .Critical Care  Performed by: Pete Pelt, PA Authorized by: Pete Pelt, PA   Critical care provider statement:    Critical care time (minutes):  30   Critical care was necessary to treat or prevent imminent or life-threatening deterioration of the following conditions:  Respiratory failure (hypokalemia)   Critical care was time spent personally by me on the following activities:  Ordering and performing treatments and interventions, ordering and review of laboratory studies, ordering and review of radiographic studies, re-evaluation of patient's condition, review of old charts, examination of patient and obtaining history from patient or surrogate   Care discussed with: admitting provider       Medications Ordered in ED Medications  heparin ADULT infusion 100 units/mL (25000 units/259mL) (1,200 Units/hr Intravenous New Bag/Given 12/19/22 1638)  potassium chloride 10 mEq in 100 mL IVPB (has no administration in time range)  potassium chloride SA (KLOR-CON M) CR tablet 40 mEq (has no administration in time range)  furosemide (LASIX) injection 40 mg (has no administration in time range)  heparin bolus via infusion 2,250 Units  (2,250 Units Intravenous Bolus from Bag 12/19/22 1638)  sodium chloride 0.9 % bolus 250 mL (0 mLs Intravenous Stopped 12/19/22 1720)  iohexol (OMNIPAQUE) 350 MG/ML injection 75 mL (75 mLs Intravenous Contrast Given 12/19/22 1737)    ED Course/ Medical Decision Making/ A&P  Medical Decision Making Patient is a 72 year old female, here for swelling 3 weeks.  She is requiring 6 L O2, or else she is 82 percent on room air.  Will obtain bilateral lower extremity ultrasound to evaluate for DVT, as well as D-dimer, to evaluate for PE as she is alert and moderate risk.  We will obtain a chest x-ray, troponin.  Amount and/or Complexity of Data Reviewed Labs: ordered.    Details: K is 2.8, elevated D-dimer at 3+ Radiology: ordered.    Details: Multiple findings of DVTs of bilateral lower extremities, CTA shows no evidence of PE, but has interstitial findings of bilateral lungs concerning for edema versus atypical inflammation ECG/medicine tests:  Decision-making details documented in ED Course. Discussion of management or test interpretation with external provider(s): Patient found to have multiple DVTs, started on heparin, does have pulses however.  Additionally CTA was negative for PE, however has interstitial edema I suspect, given that she has absence of any kind of infectious symptoms.  Will start her on Lasix, potassium for her K of 2.8.  Admitted to hospitalist Dr. Adela Glimpse.  Risk Prescription drug management. Decision regarding hospitalization.    Final Clinical Impression(s) / ED Diagnoses Final diagnoses:  Hypoxia  Deep vein thrombosis (DVT) of both lower extremities, unspecified chronicity, unspecified vein (HCC)  Hypokalemia  Acute on chronic congestive heart failure, unspecified heart failure type [facility])    Rx / DC Orders ED Discharge Orders     None         Pete Pelt, PA 12/19/22 2002    Maia Plan, MD 12/30/22 [phone]

## 2022-12-19 NOTE — H&P (Signed)
Michaela Baker XLK:[id number] DOB: 1950/11/09 DOA: 12/19/2022     PCP: Diamantina Providence, FNP   Outpatient Specialists: * NONE CARDS:  has upcoming appointment   Pulmonary   Dr.Sood   GI  Dr.  Levora Angel   Patient arrived to ER on 12/19/22 at 1154 Referred by Attending Long, Michaela Repress, MD   Patient coming from:    home Lives With family    Chief Complaint:   Chief Complaint  Patient presents with   Shortness of Breath    HPI: Michaela Baker is a 72 y.o. female with medical history significant of CHF, CKD, hypothyroidism,   Kidney ge folic acid deficiency, hyperlipidemia, CKD,  history of aspiration pneumonia, HTN, OSA  Presented with   lower extremity edema Patient was seen in our office mid leg swelling noted to be satting 77% on room air started on 6 L and transferred to ER.  Patient herself did not report shortness of breath or chest pain She used to be using O2 but took herself off.  She has known history of CHF.  Now on 3 L satting 95%      Denies significant ETOH intake   Does not smoke   Lab Results  Component Value Date   SARSCOV2NAA NEGATIVE 05/31/2022   SARSCOV2NAA NEGATIVE 03/03/2021      Regarding pertinent Chronic problems:     Hyperlipidemia -  on statins Crestor     HTN on Norvasc hydralazine Toprol   chronic CHF diastolic  - last echo Jan 2024 Recent Results (from the past 36644 hour(s))  ECHOCARDIOGRAM COMPLETE   Collection Time: 06/01/22 12:39 PM  Result Value   BP 124/69   Single Plane A4C EF 60.8   S' Lateral 3.30   Area-P 1/2 2.06   Single Plane A2C EF 57.6   Calc EF 59.6   MV M vel 3.59   MV Peak grad 51.6   Narrative      ECHOCARDIOGRAM REPORT        1. Left ventricular ejection fraction, by estimation, is 60 to 65%. The left ventricle has normal function. The left ventricle has no regional wall motion abnormalities. Left ventricular diastolic parameters are indeterminate.  2. Right ventricular systolic function is normal. The  right ventricular size is normal. There is mildly elevated pulmonary artery systolic pressure. The estimated right ventricular systolic pressure is 40.7 mmHg.  3. The mitral valve is grossly normal. Mild mitral valve regurgitation. No evidence of mitral stenosis.  4. The aortic valve is normal in structure. Aortic valve regurgitation is not visualized. No aortic stenosis is present.          DM2 pt denies          Hypothyroidism:  Lab Results  Component Value Date   TSH <0.010 (L) 11/09/2022   on synthroid   obesity-   BMI Readings from Last 1 Encounters:  12/19/22 32.45 kg/m       OSA - not on CPAP    CKD stage IIIa- baseline Cr 1.1 Estimated Creatinine Clearance: 40.8 mL/min (A) (by C-G formula based on SCr of 1.37 mg/dL (H)).  Lab Results  Component Value Date   CREATININE 1.37 (H) 12/19/2022   CREATININE 0.77 11/13/2022   CREATININE 1.08 (H) 11/12/2022   Chronic anemia - baseline hg Hemoglobin & Hematocrit  Recent Labs    11/10/22 0430 11/11/22 0349 12/19/22 1258  HGB 10.2* 8.3* 9.0*   Iron/TIBC/Ferritin/ %Sat    Component Value Date/Time  IRON 38 12/27/2021 2053   TIBC 217 (L) 12/27/2021 2053   FERRITIN 39 12/27/2021 2053   IRONPCTSAT 18 12/27/2021 2053     While in ER:    Doppler found new [id number] started on heparin CTA showed pleural effusions    Lab Orders         CBC with Differential         D-dimer, quantitative         Brain natriuretic peptide         CBC with Differential/Platelet         Comprehensive metabolic panel         Heparin level (unfractionated)         Heparin level (unfractionated)       CXR - Tiny effusions. Interstitial changes noted bilaterally with some more focal opacity in the right mid to lower lung. Infiltrates possible.      CTA chest - No definite evidence of pulmonary embolus.   2.4 cm right thyroid nodule is noted. Recommend thyroid US. (Ref: J Am Coll Radiol. 2015 Feb;12(2): 143-50).   Small bilateral  pleural effusions are noted with adjacent subsegmental atelectasis.   Interstitial densities are noted throughout both lungs concerning for edema or possibly atypical inflammation. Cavitary lesion seen in right upper lobe on prior exam is significantly smaller currently, consistent with improving cavitary pneumonia.   Coronary artery calcifications are noted.  Following Medications were ordered in ER: Medications  heparin ADULT infusion 100 units/mL (25000 units/278mL) (1,200 Units/hr Intravenous New Bag/Given 12/19/22 1638)  potassium chloride 10 mEq in 100 mL IVPB (has no administration in time range)  potassium chloride SA (KLOR-CON M) CR tablet 40 mEq (has no administration in time range)  heparin bolus via infusion 2,250 Units (2,250 Units Intravenous Bolus from Bag 12/19/22 1638)  sodium chloride 0.9 % bolus 250 mL (0 mLs Intravenous Stopped 12/19/22 1720)  iohexol (OMNIPAQUE) 350 MG/ML injection 75 mL (75 mLs Intravenous Contrast Given 12/19/22 1737)       ED Triage Vitals  Encounter Vitals Group     BP 12/19/22 1207 (!) 111/43     Systolic BP Percentile --      Diastolic BP Percentile --      Pulse Rate 12/19/22 1207 70     Resp 12/19/22 1207 (!) 24     Temp 12/19/22 1207 98.2 F (36.8 C)     Temp Source 12/19/22 1207 Oral     SpO2 12/19/22 1205 (!) 82 %     Weight 12/19/22 1208 195 lb (88.5 kg)     Height 12/19/22 1208 5\' 5"  (1.651 m)     Head Circumference --      Peak Flow --      Pain Score 12/19/22 1208 0     Pain Loc --      Pain Education --      Exclude from Growth Chart --   JXBJ(47)@     _________________________________________ Significant initial  Findings: Abnormal Labs Reviewed  D-DIMER, QUANTITATIVE - Abnormal; Notable for the following components:      Result Value   D-Dimer, Quant 3.02 (*)    All other components within normal limits  CBC WITH DIFFERENTIAL/PLATELET - Abnormal; Notable for the following components:   RBC 3.32 (*)    Hemoglobin 9.0  (*)    HCT 30.5 (*)    MCHC 29.5 (*)    RDW 18.8 (*)    All other components within normal limits  COMPREHENSIVE METABOLIC  PANEL - Abnormal; Notable for the following components:   Potassium 2.8 (*)    CO2 20 (*)    Creatinine, Ser 1.37 (*)    Calcium 8.0 (*)    Albumin 2.2 (*)    GFR, Estimated 41 (*)    All other components within normal limits  TROPONIN I (HIGH SENSITIVITY) - Abnormal; Notable for the following components:   Troponin I (High Sensitivity) 24 (*)    All other components within normal limits  TROPONIN I (HIGH SENSITIVITY) - Abnormal; Notable for the following components:   Troponin I (High Sensitivity) 24 (*)    All other components within normal limits    _________________________ Troponin  ordered Cardiac Panel (last 3 results) Recent Labs    12/19/22 1258 12/19/22 1545  TROPONINIHS 24* 24*     ECG: Ordered Personally reviewed and interpreted by me showing: HR : 67 Rhythm:  SR,  , no evidence of ischemic changes QTC 441  BNP (last 3 results) Recent Labs    05/31/22 1519 11/07/22 2000  BNP 157.3* 72.6     COVID-19 Labs  Recent Labs    12/19/22 1310  DDIMER 3.02*    Lab Results  Component Value Date   SARSCOV2NAA NEGATIVE 05/31/2022   SARSCOV2NAA NEGATIVE 03/03/2021    The recent clinical data is shown below. Vitals:   12/19/22 1611 12/19/22 1612 12/19/22 1612 12/19/22 1630  BP: (!) 144/75   135/74  Pulse: 67  70 68  Resp: (!) 23  (!) 26 (!) 23  Temp:  97.7 F (36.5 C)    TempSrc:  Oral    SpO2: 100%  100% 98%  Weight:      Height:        WBC     Component Value Date/Time   WBC 4.4 12/19/2022 1258   LYMPHSABS 1.0 12/19/2022 1258   LYMPHSABS 2.3 01/02/2012 0950   MONOABS 0.5 12/19/2022 1258   EOSABS 0.0 12/19/2022 1258   EOSABS 0.1 01/02/2012 0950   BASOSABS 0.0 12/19/2022 1258   BASOSABS 0.0 01/02/2012 0950    Procalcitonin 3      UA  ordered    Results for orders placed or performed during the hospital  encounter of 11/09/22  MRSA Next Gen by PCR, Nasal     Status: None   Collection Time: 11/09/22  3:22 AM   Specimen: Nasal Mucosa; Nasal Swab  Result Value Ref Range Status   MRSA by PCR Next Gen NOT DETECTED NOT DETECTED Final    Comment: (NOTE) The GeneXpert MRSA Assay (FDA approved for NASAL specimens only), is one component of a comprehensive MRSA colonization surveillance program. It is not intended to diagnose MRSA infection nor to guide or monitor treatment for MRSA infections. Test performance is not FDA approved in patients less than 65 years old. Performed at [facility], [address] [place], [place] [address]   Blood culture (routine x 2)     Status: None   Collection Time: 11/09/22  6:00 PM   Specimen: BLOOD RIGHT ARM  Result Value Ref Range Status   Specimen Description   Final    BLOOD RIGHT ARM Performed at [facility], [address], [place], [place] [address]    Special Requests   Final    BOTTLES DRAWN AEROBIC AND ANAEROBIC Blood Culture adequate volume Performed at [facility], [address], [place], [place] [address]    Culture   Final    NO  GROWTH 5 DAYS Performed at [facility], [address] [place], [place] [address]    Report Status 11/14/2022 FINAL  Final  Blood culture (routine x 2)     Status: None   Collection Time: 11/09/22  6:10 PM   Specimen: BLOOD RIGHT ARM  Result Value Ref Range Status   Specimen Description   Final    BLOOD RIGHT ARM Performed at [facility], [address] [place], [place] [address]    Special Requests   Final    BOTTLES DRAWN AEROBIC AND ANAEROBIC Blood Culture adequate volume Performed at [facility], [address] [place], [place] [address]    Culture   Final    NO GROWTH 5 DAYS Performed at [facility], [address] [place], [place] [address]    Report Status 11/14/2022 FINAL  Final  Urine Culture (for  pregnant, neutropenic or urologic patients or patients with an indwelling urinary catheter)     Status: Abnormal   Collection Time: 11/10/22  4:36 AM   Specimen: Urine, Clean Catch  Result Value Ref Range Status   Specimen Description   Final    URINE, CLEAN CATCH Performed at [facility], [address], [place], [place] [address]    Special Requests   Final    NONE Performed at [facility], [address], [place], [place] [address]    Culture (A)  Final    <10,000 COLONIES/mL INSIGNIFICANT GROWTH Performed at [facility], [address] [place], [place] [address]    Report Status 11/11/2022 FINAL  Final    __________________________________________________________ Recent Labs  Lab 12/19/22 1258 12/19/22 2333  NA 138 139  K 2.8* 2.5*  CO2 20* 22  GLUCOSE 79 99  BUN 12 11  CREATININE 1.37* 1.18*  CALCIUM 8.0* 8.0*  MG  --  1.6*  PHOS  --  2.5    Cr   up from baseline see below Lab Results  Component Value Date   CREATININE 1.18 (H) 12/19/2022   CREATININE 1.37 (H) 12/19/2022   CREATININE 0.77 11/13/2022    Recent Labs  Lab 12/19/22 1258  AST 26  ALT 11  ALKPHOS 45  BILITOT 0.5  PROT 7.0  ALBUMIN 2.2*   Lab Results  Component Value Date   CALCIUM 8.0 (L) 12/19/2022   PHOS 3.6 07/25/2013    Plt: Lab Results  Component Value Date   PLT 228 12/19/2022    Recent Labs  Lab 12/19/22 1258  WBC 4.4  NEUTROABS 2.8  HGB 9.0*  HCT 30.5*  MCV 91.9  PLT 228    HG/HCT   Down   from baseline see below    Component Value Date/Time   HGB 9.0 (L) 12/19/2022 1258   HGB 15.6 01/02/2012 0950   HCT 30.5 (L) 12/19/2022 1258   HCT 47.9 (H) 01/02/2012 0950   MCV 91.9 12/19/2022 1258   MCV 83 01/02/2012 0950    _______________________________________________ Hospitalist was called for admission for   Hypoxia  Deep vein thrombosis (DVT)   Hypokalemia diastolic CHF exacerbation    The following Work up has  been ordered so far:  Orders Placed This Encounter  Procedures   DG Chest 2 View   CT Angio Chest PE W and/or Wo Contrast   CBC with Differential   D-dimer, quantitative   Brain natriuretic peptide   CBC with Differential/Platelet   Comprehensive metabolic panel  Heparin level (unfractionated)   Heparin level (unfractionated)   heparin per pharmacy consult   Consult for [facility] Admission   ED EKG   EKG 12-Lead   VAS [place] LOWER EXTREMITY VENOUS (DVT)     OTHER Significant initial  Findings:  labs showing:     DM  labs:  HbA1C: Recent Labs    06/01/22 1327 11/09/22 1625  HGBA1C 5.6 4.9       CBG (last 3)  No results for input(s): "GLUCAP" in the last 72 hours.        Cultures:    Component Value Date/Time   SDES  11/10/2022 0436    URINE, CLEAN CATCH Performed at [facility], [address] [place], [place] [address]    SPECREQUEST  11/10/2022 0865    NONE Performed at [facility], [address], [place], [place] [address]    CULT (A) 11/10/2022 0436    <10,000 COLONIES/mL INSIGNIFICANT GROWTH Performed at [facility], [address] [place], [place] [address]    REPTSTATUS 11/11/2022 FINAL 11/10/2022 0436     Radiological Exams on Admission: VAS [place] LOWER EXTREMITY VENOUS (DVT)  Result Date: 12/19/2022  Lower Venous DVT Study Patient Name:  TASHAYLA THERIEN [facility]  Date of Exam:   12/19/2022 Medical Rec #: [id number]      Accession #:    [id number] Date of Birth: December 29, 1950      Patient Gender: F Patient Age:   26 years Exam Location:  [facility] Procedure:      VAS [place] LOWER EXTREMITY VENOUS (DVT) Referring Phys: Nehemiah Settle SMALL --------------------------------------------------------------------------------  Indications: Swelling, and SOB.  Risk Factors: History of right LE reflux disease. S/P right GSV ablation 01/26/22. Limitations: Body habitus and edema. Comparison Study: Prior negative right LEV  Performing Technologist: Sherren Kerns RVS  Examination Guidelines: A complete evaluation includes B-mode imaging, spectral Doppler, color Doppler, and power Doppler as needed of all accessible portions of each vessel. Bilateral testing is considered an integral part of a complete examination. Limited examinations for reoccurring indications may be performed as noted. The reflux portion of the exam is performed with the patient in reverse Trendelenburg.  +---------+---------------+---------+-----------+--------------+---------------+ RIGHT    CompressibilityPhasicitySpontaneityProperties    Thrombus Aging  +---------+---------------+---------+-----------+--------------+---------------+ CFV      Partial                            pulsatile flowAge                                                                       Indeterminate   +---------+---------------+---------+-----------+--------------+---------------+ FV Prox  Full                                                             +---------+---------------+---------+-----------+--------------+---------------+ FV Mid   Full                                                             +---------+---------------+---------+-----------+--------------+---------------+  FV Distal                                   pulsatile flowpatent by color                                                           and Doppler     +---------+---------------+---------+-----------+--------------+---------------+ PFV                                                       Not well                                                                  visualized      +---------+---------------+---------+-----------+--------------+---------------+ POP      Partial        No       No                       Age                                                                       Indeterminate    +---------+---------------+---------+-----------+--------------+---------------+ PTV                                                       Not well                                                                  visualized      +---------+---------------+---------+-----------+--------------+---------------+ PERO                                                      Not well  visualized      +---------+---------------+---------+-----------+--------------+---------------+   +----------+---------------+---------+-----------+----------+-----------------+ LEFT      CompressibilityPhasicitySpontaneityPropertiesThrombus Aging    +----------+---------------+---------+-----------+----------+-----------------+ CFV       None           No       No                   Age Indeterminate +----------+---------------+---------+-----------+----------+-----------------+ SFJ       Full                                                           +----------+---------------+---------+-----------+----------+-----------------+ FV Prox   None                                         Age Indeterminate +----------+---------------+---------+-----------+----------+-----------------+ FV Mid    None                                         Age Indeterminate +----------+---------------+---------+-----------+----------+-----------------+ FV Distal None                                         Age Indeterminate +----------+---------------+---------+-----------+----------+-----------------+ PFV       Full                                                           +----------+---------------+---------+-----------+----------+-----------------+ POP       None           No       No                   Age Indeterminate +----------+---------------+---------+-----------+----------+-----------------+ PTV       None                                          Acute             +----------+---------------+---------+-----------+----------+-----------------+ PERO                                                   Not well                                                                 visualized        +----------+---------------+---------+-----------+----------+-----------------+ EIV  patent            +----------+---------------+---------+-----------+----------+-----------------+ Distal CIV                                             patent            +----------+---------------+---------+-----------+----------+-----------------+     Summary: RIGHT: - Findings consistent with age indeterminate deep vein thrombosis involving the right common femoral vein, and right popliteal vein. - No cystic structure found in the popliteal fossa.  LEFT: - Findings consistent with age indeterminate deep vein thrombosis involving the left common femoral vein, left femoral vein, left popliteal vein, and left posterior tibial veins. - No cystic structure found in the popliteal fossa.  *See table(s) above for measurements and observations. Electronically signed by Lemar Livings MD on 12/19/2022 at 7:04:33 PM.    Final    CT Angio Chest PE W and/or Wo Contrast  Result Date: 12/19/2022 CLINICAL DATA:  Positive D-dimer. EXAM: CT ANGIOGRAPHY CHEST WITH CONTRAST TECHNIQUE: Multidetector CT imaging of the chest was performed using the standard protocol during bolus administration of intravenous contrast. Multiplanar CT image reconstructions and MIPs were obtained to evaluate the vascular anatomy. RADIATION DOSE REDUCTION: This exam was performed according to the departmental dose-optimization program which includes automated exposure control, adjustment of the mA and/or kV according to patient size and/or use of iterative reconstruction technique. CONTRAST:  75mL OMNIPAQUE IOHEXOL  350 MG/ML SOLN COMPARISON:  November 09, 2022. FINDINGS: Cardiovascular: Satisfactory opacification of the pulmonary arteries to the segmental level. No evidence of pulmonary embolism. Mild cardiomegaly. No pericardial effusion. Coronary artery calcifications are noted. Mediastinum/Nodes: 2.4 cm right thyroid nodule is noted. No adenopathy is noted. The esophagus is unremarkable. Lungs/Pleura: Small bilateral pleural effusions are noted with adjacent subsegmental atelectasis. Interstitial densities are noted throughout both lungs concerning for edema or possibly atypical pneumonia. Cavitary lesion noted in right upper lobe on prior exam is significantly smaller currently, measuring 3.5 x 2.5 cm. Upper Abdomen: No acute abnormality. Musculoskeletal: No chest wall abnormality. No acute or significant osseous findings. Review of the MIP images confirms the above findings. IMPRESSION: No definite evidence of pulmonary embolus. 2.4 cm right thyroid nodule is noted. Recommend thyroid US. (Ref: J Am Coll Radiol. 2015 Feb;12(2): 143-50). Small bilateral pleural effusions are noted with adjacent subsegmental atelectasis. Interstitial densities are noted throughout both lungs concerning for edema or possibly atypical inflammation. Cavitary lesion seen in right upper lobe on prior exam is significantly smaller currently, consistent with improving cavitary pneumonia. Coronary artery calcifications are noted. Aortic Atherosclerosis (ICD10-I70.0) and Emphysema (ICD10-J43.9). Electronically Signed   By: Lupita Raider M.D.   On: 12/19/2022 18:00   DG Chest 2 View  Result Date: 12/19/2022 CLINICAL DATA:  Hypoxia.  CHF and leg swelling EXAM: CHEST - 2 VIEW COMPARISON:  Chest x-ray 11/09/2022 FINDINGS: Calcified aorta. Normal cardiopericardial silhouette. Interstitial changes are noted. Some subtle patchy opacity in the right mid to lower lung as well. Infiltrates possible. Tiny pleural effusions. No pneumothorax. Overlapping  cardiac leads. IMPRESSION: Tiny effusions. Interstitial changes noted bilaterally with some more focal opacity in the right mid to lower lung. Infiltrates possible. Recommend short follow-up. Electronically Signed   By: Karen Kays M.D.   On: 12/19/2022 14:21   _______________________________________________________________________________________________________ Latest  Blood pressure 135/74, pulse 68, temperature 97.7 F (36.5 C), temperature source Oral, resp. rate Marland Kitchen)  23, height 5\' 5"  (1.651 m), weight 88.5 kg, SpO2 98%.   Vitals  labs and radiology finding personally reviewed  Review of Systems:    Pertinent positives include:  shortness of breath at rest.  dyspnea on exertion, Bilateral lower extremity swelling  Constitutional:  No weight loss, night sweats, Fevers, chills, fatigue, weight loss  HEENT:  No headaches, Difficulty swallowing,Tooth/dental problems,Sore throat,  No sneezing, itching, ear ache, nasal congestion, post nasal drip,  Cardio-vascular:  No chest pain, Orthopnea, PND, anasarca, dizziness, palpitations.no  GI:  No heartburn, indigestion, abdominal pain, nausea, vomiting, diarrhea, change in bowel habits, loss of appetite, melena, blood in stool, hematemesis Resp:  no  No excess mucus, no productive cough, No non-productive cough, No coughing up of blood.No change in color of mucus.No wheezing. Skin:  no rash or lesions. No jaundice GU:  no dysuria, change in color of urine, no urgency or frequency. No straining to urinate.  No flank pain.  Musculoskeletal:  No joint pain or no joint swelling. No decreased range of motion. No back pain.  Psych:  No change in mood or affect. No depression or anxiety. No memory loss.  Neuro: no localizing neurological complaints, no tingling, no weakness, no double vision, no gait abnormality, no slurred speech, no confusion  All systems reviewed and apart from HOPI all are  negative _______________________________________________________________________________________________ Past Medical History:   Past Medical History:  Diagnosis Date   CHF (congestive heart failure) (HCC)    Hypertension    Hypothyroidism 03/29/2017   Obesity (BMI 30-39.9)    Premature ventricular contractions       Past Surgical History:  Procedure Laterality Date   ABDOMINAL HYSTERECTOMY     CHOLECYSTECTOMY     COLONOSCOPY WITH PROPOFOL N/A 12/29/2021   Procedure: COLONOSCOPY WITH PROPOFOL;  Surgeon: Kathi Der, MD;  Location: [facility];  Service: Gastroenterology;  Laterality: N/A;   ENDOVENOUS ABLATION SAPHENOUS VEIN W/ LASER Right 01/26/2022   endovenous laser ablation right greater saphenous vein by Cari Caraway MD   TONSILLECTOMY     TUBAL LIGATION      Social History:  Ambulatory   walker      reports that she has never smoked. She has never used smokeless tobacco. She reports that she does not drink alcohol and does not use drugs.    Family History:   Family History  Problem Relation Age of Onset   Diabetes Mother    Heart failure Mother    Kidney failure Mother    Kidney failure Sister    Diabetes Sister    Heart attack Sister    Breast cancer Neg Hx    ______________________________________________________________________________________________ Allergies: No Known Allergies   Prior to Admission medications   Medication Sig Start Date End Date Taking? Authorizing Provider  acetaminophen (TYLENOL) 500 MG tablet Take 1,000 mg by mouth as needed (pain).   Yes [provider]  amLODipine (NORVASC) 10 MG tablet Take 10 mg by mouth daily. 02/04/20  Yes [provider]  aspirin EC 81 MG EC tablet Take 1 tablet (81 mg total) by mouth daily. 07/26/13  Yes Leatha Gilding, MD  dapagliflozin propanediol (FARXIGA) 10 MG TABS tablet Take 10 mg by mouth daily.   Yes [provider]  folic acid (FOLVITE) 1 MG tablet Take 1  tablet (1 mg total) by mouth daily. 11/13/22   Glade Lloyd, MD  hydrALAZINE (APRESOLINE) 50 MG tablet Take 50 mg by mouth daily.    [provider]  KERENDIA 10 MG TABS take 1 tablet by mouth daily for chronic kidney disease    [provider]  levothyroxine (SYNTHROID, LEVOTHROID) 25 MCG tablet Take 12.5 mcg by mouth daily. 02/05/15   [provider]  megestrol (MEGACE) 40 MG tablet Take 1 tablet by mouth 2 (two) times daily. 10/28/22   [provider]  metoprolol succinate (TOPROL-XL) 50 MG 24 hr tablet Take 50 mg by mouth daily. Take with or immediately following a meal.    [provider]  Multiple Vitamins-Minerals (MULTIVITAMIN WITH MINERALS) tablet Take 2 tablets by mouth daily.    [provider]  rosuvastatin (CRESTOR) 5 MG tablet Take 5 mg by mouth daily. 05/07/21   [provider]    ___________________________________________________________________________________________________ Physical Exam:    12/19/2022    4:30 PM 12/19/2022    4:12 PM 12/19/2022    4:11 PM  Vitals with BMI  Systolic 135  144  Diastolic 74  75  Pulse 68 70 67     1. General:  in No  Acute distress   Chronically ill   -appearing 2. Psychological: Alert and   Oriented 3. Head/ENT: Dry Mucous Membranes                          Head Non traumatic, neck supple                           Poor Dentition 4. SKIN:  normal Skin turgor,  Skin clean Dry and intact no rash    5. Heart: Regular rate and rhythm no  Murmur, no Rub or gallop 6. Lungs  no wheezes some crackles   7. Abdomen: Soft,  non-tender, Non distended bowel sounds present 8. Lower extremities: no clubbing, cyanosis, no  edema 9. Neurologically Grossly intact, moving all 4 extremities equally  10. MSK: Normal range of motion    Chart has been reviewed  ______________________________________________________________________________________________  Assessment/Plan  72 y.o. female  with medical history significant of CHF, CKD, hypothyroidism,   Kidney ge folic acid deficiency, hyperlipidemia, CKD,  history of aspiration pneumonia, HTN, OSA   Admitted for   Hypoxia  Deep vein thrombosis (DVT) of both lower extremities,  Hypokalemia, diastolic  CHF exacerbation     Present on Admission:  Acute diastolic CHF (congestive heart failure) (HCC)  Hypertension  Hypothyroidism  OSA (obstructive sleep apnea)  Acute hypoxic respiratory failure (HCC)  DVT (deep venous thrombosis) (HCC)  Obesity (BMI 30-39.9)  Pulmonary hypertension (HCC)  Chronic hypokalemia  Hyperlipidemia  Hypoglycemia    Acute diastolic CHF (congestive heart failure) (HCC) - Pt diagnosed with CHF based on presence of the following: OA,  Pulmonary edema on CXR, and   bilateral leg edema, DOE,   With noted response to IV diuretic in ER  admit on telemetry,  cycle cardiac enzymes, Troponin    obtain serial ECG  to evaluate for ischemia as a cause of heart failure  monitor daily weight:  Filed Weights   12/19/22 1208  Weight: 88.5 kg   Last BNP BNP (last 3 results) Recent Labs    05/31/22 1519 11/07/22 2000 12/19/22 2333  BNP 157.3* 72.6 649.5*      diurese with IV lasix and monitor orthostatics and creatinine to avoid over diuresis.  Order echogram to evaluate EF and valves     Hypertension Resume home meds when BP allows  Hypothyroidism TSH low  Check T3  t4 level Hold synthroid  OSA (obstructive sleep apnea) Pt states doe snot use CPAP  Acute hypoxic respiratory failure (HCC)  this patient has acute respiratory failure with Hypoxia  as documented by the presence of following: O2 saturatio< 90% on RA   Likely due to:  CHF exacerbation,   Provide O2 therapy and titrate as needed  Continuous pulse ox   check Pulse ox with ambulation prior to discharge   may need  TC consult for home O2 set up     DVT (deep venous thrombosis) (HCC) Cont on heparin  CTA neg for  PE  Obesity (BMI 30-39.9) Nutrition consult as an out pt  Pulmonary hypertension (HCC) Chronic repeat echo  Chronic hypokalemia Will replace, check magnesium and replace as needed  Hyperlipidemia Continue Crestor 5 mg po q day  Hypoglycemia Hold farxiga  CBG q 2h     Other plan as per orders.  DVT prophylaxis:  heparin    Code Status:    Code Status: Prior FULL CODE as per patient  I had personally discussed CODE STATUS with patient   ACP none   Family Communication:   Family not at  Bedside    Diet  heart healthy   Disposition Plan:    To home once workup is complete and patient is stable   Following barriers for discharge:                            Electrolytes corrected                                                    Diabetes care coordinator                   Consults called: none If ECHO significantly changed may need cardiology consult in AM   Admission status:  ED Disposition     ED Disposition  Admit   Condition  --   Comment  Hospital Area: [facility] [[id number]]  Level of Care: Telemetry Cardiac [[id number]]  May admit patient to [facility] or [facility] if equivalent level of care is available:: No  Covid Evaluation: Asymptomatic - no recent exposure (last 10 days) testing not required  Diagnosis: Acute diastolic CHF (congestive heart failure) [facility]) [[id number]]  Admitting Physician: Therisa Doyne [[id number]]  Attending Physician: Therisa Doyne [[id number]]  Certification:: I certify this patient will need inpatient services for at least 2 midnights  Estimated Length of Stay: 2           inpatient     I Expect 2 midnight stay secondary to severity of patient's current illness need for inpatient interventions justified by the following:  hemodynamic instability despite optimal treatment ( hypoxia )  Severe lab/radiological/exam abnormalities including:    and extensive comorbidities including:  substance abuse    CHF Morbid  Obesity    That are currently affecting medical management.   I expect  patient to be hospitalized for 2 midnights requiring inpatient medical care.  Patient is at high risk for adverse outcome (such as loss of life or disability) if not treated.  Indication for inpatient stay as follows:    New or worsening hypoxia    Need for  IV fluids, IV heparin   Level of care  tele  For   24H      Dustee Bottenfield 12/20/2022, 3:10 AM    [facility]     after 2 AM please page floor coverage PA If 7AM-7PM, please contact the day team taking care of the patient using [email or web address]

## 2022-12-20 ENCOUNTER — Other Ambulatory Visit (HOSPITAL_COMMUNITY): Payer: Self-pay

## 2022-12-20 ENCOUNTER — Inpatient Hospital Stay (HOSPITAL_COMMUNITY): Payer: 59

## 2022-12-20 DIAGNOSIS — I82409 Acute embolism and thrombosis of unspecified deep veins of unspecified lower extremity: Secondary | ICD-10-CM | POA: Diagnosis present

## 2022-12-20 DIAGNOSIS — E785 Hyperlipidemia, unspecified: Secondary | ICD-10-CM

## 2022-12-20 DIAGNOSIS — E876 Hypokalemia: Secondary | ICD-10-CM | POA: Diagnosis not present

## 2022-12-20 DIAGNOSIS — E039 Hypothyroidism, unspecified: Secondary | ICD-10-CM

## 2022-12-20 DIAGNOSIS — E162 Hypoglycemia, unspecified: Secondary | ICD-10-CM | POA: Diagnosis present

## 2022-12-20 DIAGNOSIS — I5021 Acute systolic (congestive) heart failure: Secondary | ICD-10-CM

## 2022-12-20 DIAGNOSIS — I824Y3 Acute embolism and thrombosis of unspecified deep veins of proximal lower extremity, bilateral: Secondary | ICD-10-CM | POA: Diagnosis not present

## 2022-12-20 DIAGNOSIS — I5031 Acute diastolic (congestive) heart failure: Secondary | ICD-10-CM | POA: Diagnosis not present

## 2022-12-20 LAB — BASIC METABOLIC PANEL
Anion gap: 9 (ref 5–15)
Anion gap: 8 (ref 5–15)
BUN: 11 mg/dL (ref 8–23)
BUN: 11 mg/dL (ref 8–23)
CO2: 22 mmol/L (ref 22–32)
CO2: 21 mmol/L — ABNORMAL LOW (ref 22–32)
Calcium: 8 mg/dL — ABNORMAL LOW (ref 8.9–10.3)
Calcium: 7.8 mg/dL — ABNORMAL LOW (ref 8.9–10.3)
Chloride: 108 mmol/L (ref 98–111)
Chloride: 111 mmol/L (ref 98–111)
Creatinine, Ser: 1.18 mg/dL — ABNORMAL HIGH (ref 0.44–1.00)
Creatinine, Ser: 1.13 mg/dL — ABNORMAL HIGH (ref 0.44–1.00)
GFR, Estimated: 49 mL/min — ABNORMAL LOW (ref >60)
GFR, Estimated: 52 mL/min — ABNORMAL LOW (ref >60)
Glucose, Bld: 99 mg/dL (ref 70–99)
Glucose, Bld: 81 mg/dL (ref 70–99)
Potassium: 2.5 mmol/L — CL (ref 3.5–5.1)
Potassium: 3.2 mmol/L — ABNORMAL LOW (ref 3.5–5.1)
Sodium: 139 mmol/L (ref 135–145)
Sodium: 140 mmol/L (ref 135–145)

## 2022-12-20 LAB — CBG MONITORING, ED
Glucose-Capillary: 70 mg/dL (ref 70–99)
Glucose-Capillary: 79 mg/dL (ref 70–99)
Glucose-Capillary: 80 mg/dL (ref 70–99)
Glucose-Capillary: 97 mg/dL (ref 70–99)

## 2022-12-20 LAB — CBC
HCT: 30.9 % — ABNORMAL LOW (ref 36.0–46.0)
Hemoglobin: 8.9 g/dL — ABNORMAL LOW (ref 12.0–15.0)
MCH: 25.4 pg — ABNORMAL LOW (ref 26.0–34.0)
MCHC: 28.8 g/dL — ABNORMAL LOW (ref 30.0–36.0)
MCV: 88.3 fL (ref 80.0–100.0)
Platelets: 241 10*3/uL (ref 150–400)
RBC: 3.5 MIL/uL — ABNORMAL LOW (ref 3.87–5.11)
RDW: 18.7 % — ABNORMAL HIGH (ref 11.5–15.5)
WBC: 5.4 10*3/uL (ref 4.0–10.5)
nRBC: 0 % (ref 0.0–0.2)

## 2022-12-20 LAB — OSMOLALITY: Osmolality: 305 mOsm/kg — ABNORMAL HIGH (ref 275–295)

## 2022-12-20 LAB — BRAIN NATRIURETIC PEPTIDE
B Natriuretic Peptide: 649.5 pg/mL — ABNORMAL HIGH (ref 0.0–100.0)
B Natriuretic Peptide: 549 pg/mL — ABNORMAL HIGH (ref 0.0–100.0)

## 2022-12-20 LAB — RETICULOCYTES
Immature Retic Fract: 21.8 % — ABNORMAL HIGH (ref 2.3–15.9)
RBC.: 3.3 MIL/uL — ABNORMAL LOW (ref 3.87–5.11)
Retic Count, Absolute: 99.3 10*3/uL (ref 19.0–186.0)
Retic Ct Pct: 3 % (ref 0.4–3.1)

## 2022-12-20 LAB — COMPREHENSIVE METABOLIC PANEL
ALT: 10 U/L (ref 0–44)
AST: 19 U/L (ref 15–41)
Albumin: 2.1 g/dL — ABNORMAL LOW (ref 3.5–5.0)
Alkaline Phosphatase: 46 U/L (ref 38–126)
Anion gap: 8 (ref 5–15)
BUN: 9 mg/dL (ref 8–23)
CO2: 24 mmol/L (ref 22–32)
Calcium: 8.1 mg/dL — ABNORMAL LOW (ref 8.9–10.3)
Chloride: 109 mmol/L (ref 98–111)
Creatinine, Ser: 1.38 mg/dL — ABNORMAL HIGH (ref 0.44–1.00)
GFR, Estimated: 41 mL/min — ABNORMAL LOW (ref >60)
Glucose, Bld: 93 mg/dL (ref 70–99)
Potassium: 3.2 mmol/L — ABNORMAL LOW (ref 3.5–5.1)
Sodium: 141 mmol/L (ref 135–145)
Total Bilirubin: 0.5 mg/dL (ref 0.3–1.2)
Total Protein: 7.2 g/dL (ref 6.5–8.1)

## 2022-12-20 LAB — ECHOCARDIOGRAM COMPLETE
Area-P 1/2: 2.65 cm2
Calc EF: 60.6 %
Height: 65 in
S' Lateral: 3.55 cm
Single Plane A2C EF: 61.8 %
Single Plane A4C EF: 60.7 %
Weight: 3120 oz

## 2022-12-20 LAB — MAGNESIUM
Magnesium: 1.6 mg/dL — ABNORMAL LOW (ref 1.7–2.4)
Magnesium: 2 mg/dL (ref 1.7–2.4)
Magnesium: 1.9 mg/dL (ref 1.7–2.4)

## 2022-12-20 LAB — HEPARIN LEVEL (UNFRACTIONATED)
Heparin Unfractionated: 0.23 IU/mL — ABNORMAL LOW (ref 0.30–0.70)
Heparin Unfractionated: 0.31 IU/mL (ref 0.30–0.70)
Heparin Unfractionated: 0.38 IU/mL (ref 0.30–0.70)

## 2022-12-20 LAB — FERRITIN: Ferritin: 35 ng/mL (ref 11–307)

## 2022-12-20 LAB — IRON AND TIBC
Iron: 14 ug/dL — ABNORMAL LOW (ref 28–170)
Saturation Ratios: 7 % — ABNORMAL LOW (ref 10.4–31.8)
TIBC: 196 ug/dL — ABNORMAL LOW (ref 250–450)
UIBC: 182 ug/dL

## 2022-12-20 LAB — VITAMIN B12: Vitamin B-12: 834 pg/mL (ref 180–914)

## 2022-12-20 LAB — T4, FREE: Free T4: 1.13 ng/dL — ABNORMAL HIGH (ref 0.61–1.12)

## 2022-12-20 LAB — HEMOGLOBIN A1C
Hgb A1c MFr Bld: 4.1 % — ABNORMAL LOW (ref 4.8–5.6)
Mean Plasma Glucose: 70.97 mg/dL

## 2022-12-20 LAB — PHOSPHORUS
Phosphorus: 2.5 mg/dL (ref 2.5–4.6)
Phosphorus: 2.4 mg/dL — ABNORMAL LOW (ref 2.5–4.6)

## 2022-12-20 LAB — FOLATE: Folate: 38.7 ng/mL (ref >5.9)

## 2022-12-20 LAB — TSH: TSH: <0.01 u[IU]/mL — ABNORMAL LOW (ref 0.350–4.500)

## 2022-12-20 MED ORDER — SODIUM CHLORIDE 0.9% FLUSH
3.0000 mL | Freq: Two times a day (BID) | INTRAVENOUS | Status: DC
  Administered 2022-12-21 – 2022-12-25 (×7): 3 mL via INTRAVENOUS

## 2022-12-20 MED ORDER — METOPROLOL SUCCINATE ER 50 MG PO TB24
50.0000 mg | ORAL_TABLET | Freq: Every day | ORAL | Status: DC
  Administered 2022-12-20 – 2022-12-25 (×6): 50 mg via ORAL
  Filled 2022-12-20 (×6): qty 1

## 2022-12-20 MED ORDER — ROSUVASTATIN CALCIUM 5 MG PO TABS
5.0000 mg | ORAL_TABLET | Freq: Every day | ORAL | Status: DC
  Administered 2022-12-20 – 2022-12-25 (×6): 5 mg via ORAL
  Filled 2022-12-20 (×6): qty 1

## 2022-12-20 MED ORDER — MAGNESIUM SULFATE 2 GM/50ML IV SOLN
2.0000 g | Freq: Once | INTRAVENOUS | Status: AC
  Administered 2022-12-20: 2 g via INTRAVENOUS
  Filled 2022-12-20: qty 50

## 2022-12-20 MED ORDER — ACETAMINOPHEN 650 MG RE SUPP: 650.0000 mg | Freq: Four times a day (QID) | RECTAL | Status: DC | PRN

## 2022-12-20 MED ORDER — POTASSIUM CHLORIDE CRYS ER 20 MEQ PO TBCR
40.0000 meq | EXTENDED_RELEASE_TABLET | Freq: Once | ORAL | Status: AC
  Administered 2022-12-20: 40 meq via ORAL
  Filled 2022-12-20: qty 2

## 2022-12-20 MED ORDER — ACETAMINOPHEN 325 MG PO TABS
650.0000 mg | ORAL_TABLET | Freq: Four times a day (QID) | ORAL | Status: DC | PRN
  Administered 2022-12-20 – 2022-12-21 (×2): 650 mg via ORAL
  Filled 2022-12-20 (×2): qty 2

## 2022-12-20 MED ORDER — ONDANSETRON HCL 4 MG PO TABS: 4.0000 mg | ORAL_TABLET | Freq: Four times a day (QID) | ORAL | Status: DC | PRN

## 2022-12-20 MED ORDER — HEPARIN BOLUS VIA INFUSION
1100.0000 [IU] | Freq: Once | INTRAVENOUS | Status: AC
  Administered 2022-12-20: 1100 [IU] via INTRAVENOUS
  Filled 2022-12-20: qty 1100

## 2022-12-20 MED ORDER — SODIUM CHLORIDE 0.9 % IV SOLN: 250.0000 mL | INTRAVENOUS | Status: DC | PRN

## 2022-12-20 MED ORDER — ONDANSETRON HCL 4 MG/2ML IJ SOLN: 4.0000 mg | Freq: Four times a day (QID) | INTRAMUSCULAR | Status: DC | PRN

## 2022-12-20 MED ORDER — FUROSEMIDE 10 MG/ML IJ SOLN: 40.0000 mg | Freq: Every day | INTRAMUSCULAR | Status: DC

## 2022-12-20 MED ORDER — HYDROCODONE-ACETAMINOPHEN 5-325 MG PO TABS: 1.0000 | ORAL_TABLET | ORAL | Status: DC | PRN

## 2022-12-20 MED ORDER — FUROSEMIDE 10 MG/ML IJ SOLN
40.0000 mg | Freq: Every day | INTRAMUSCULAR | Status: DC
  Administered 2022-12-21: 40 mg via INTRAVENOUS
  Filled 2022-12-20: qty 4

## 2022-12-20 MED ORDER — SODIUM CHLORIDE 0.9% FLUSH
3.0000 mL | INTRAVENOUS | Status: DC | PRN
  Administered 2022-12-23: 3 mL via INTRAVENOUS

## 2022-12-20 MED ORDER — POTASSIUM CHLORIDE 10 MEQ/100ML IV SOLN
10.0000 meq | INTRAVENOUS | Status: AC
  Administered 2022-12-20 (×4): 10 meq via INTRAVENOUS
  Filled 2022-12-20 (×4): qty 100

## 2022-12-20 MED ORDER — FUROSEMIDE 10 MG/ML IJ SOLN
40.0000 mg | Freq: Two times a day (BID) | INTRAMUSCULAR | Status: DC
  Administered 2022-12-20: 40 mg via INTRAVENOUS
  Filled 2022-12-20: qty 4

## 2022-12-20 MED ORDER — POTASSIUM PHOSPHATES 15 MMOLE/5ML IV SOLN
30.0000 mmol | Freq: Once | INTRAVENOUS | Status: AC
  Administered 2022-12-20: 30 mmol via INTRAVENOUS
  Filled 2022-12-20: qty 10

## 2022-12-20 NOTE — Assessment & Plan Note (Signed)
Resume home meds when BP allows

## 2022-12-20 NOTE — Assessment & Plan Note (Signed)
TSH low  Check T3 t4 level Hold synthroid

## 2022-12-20 NOTE — TOC Benefit Eligibility Note (Signed)
Pharmacy Patient Advocate Encounter  Insurance verification completed.    The patient is insured through [facility] Medicare Part D  Ran test claim for Eliquis 5 mg and the current 30 day co-pay is $0.00.  Ran test claim for Xarelto 20 mg and the current 30 day co-pay is $0.00.  This test claim was processed through [facility]- copay amounts may vary at other pharmacies due to pharmacy/plan contracts, or as the patient moves through the different stages of their insurance plan.    Roland Earl, CPHT Pharmacy Patient Advocate Specialist [facility] Patient Advocate Team Direct Number: [phone]  Fax: [phone]

## 2022-12-20 NOTE — Progress Notes (Signed)
ANTICOAGULATION CONSULT NOTE - Follow Up  Pharmacy Consult for Heparin Indication:  suspected PE >>bilateral DVTs  No Known Allergies  Patient Measurements: Height: 5\' 5"  (165.1 cm) Weight: 88.5 kg (195 lb) IBW/kg (Calculated) : 57 Heparin Dosing Weight: 76.4 kg  Vital Signs: Temp: 98.4 F (36.9 C) (07/23 0235) Temp Source: Oral (07/23 0235) BP: 119/62 (07/23 0530) Pulse Rate: 71 (07/23 0530)  Labs: Recent Labs    12/19/22 1258 12/19/22 1545 12/19/22 2333 12/20/22 0444  HGB 9.0*  --   --   --   HCT 30.5*  --   --   --   PLT 228  --   --   --   HEPARINUNFRC  --   --  0.23* 0.38  CREATININE 1.37*  --  1.18*  --   TROPONINIHS 24* 24*  --   --     Estimated Creatinine Clearance: 47.4 mL/min (A) (by C-G formula based on SCr of 1.18 mg/dL (H)).   Medical History: Past Medical History:  Diagnosis Date   CHF (congestive heart failure) (HCC)    Hypertension    Hypothyroidism 03/29/2017   Obesity (BMI 30-39.9)    Premature ventricular contractions     Medications:  (Not in a hospital admission)  Scheduled:   insulin aspart  0-9 Units Subcutaneous Q4H   potassium chloride  40 mEq Oral Once   Infusions:   heparin 1,350 Units/hr (12/20/22 0048)   potassium chloride 10 mEq (12/20/22 0447)   PRN:   Assessment: 71 yof with a history of HF, hx of endovenous laser ablation of rt saphenous vein. Patient is not on anticoagulation prior to arrival. Patient is presenting with bilateral lower extremity swelling and SOB. CTA showed no evidence of PE, but has bilateral DVTs.Heparin per pharmacy consult placed for VTE treatment.  D-Dimer 3.02, Hgb 9; plt 228 Heparin level therapeutic at 0.38 this morning, 5h after dose adjustment.   Goal of Therapy:  Heparin level 0.3-0.7 units/ml Monitor platelets by anticoagulation protocol: Yes   Plan:  Continue heparin at 1350 units/hr units/hr to maintain therapeutic level Collect confirmatory level in 8h, then daily while on  heparin Continue to monitor H&H and platelets  Rennis Petty, PharmD PGY1 Pharmacy Resident 12/20/2022 6:03 AM

## 2022-12-20 NOTE — Assessment & Plan Note (Signed)
this patient has acute respiratory failure with Hypoxia   as documented by the presence of following: O2 saturatio< 90% on RA Likely due to:   CHF exacerbation, Provide O2 therapy and titrate as needed  Continuous pulse ox  check Pulse ox with ambulation prior to discharge  may need  TC consult for home O2 set up    

## 2022-12-20 NOTE — ED Notes (Signed)
ED TO INPATIENT HANDOFF REPORT  ED Nurse Name and Phone #: Theophilus Bones [phone]  S Name/Age/Gender Michaela Baker 72 y.o. female Room/Bed: 044C/044C  Code Status   Code Status: Full Code  Home/SNF/Other Home Patient oriented to: self, place, time, and situation Is this baseline? Yes   Triage Complete: Triage complete  Chief Complaint Acute diastolic CHF (congestive heart failure) (HCC) [I50.31]  Triage Note Pt bib [facility] from [employer] [facility]. Pt seen for appointment for leg swelling but provider noticed RA spo2 was 77. Applied 6L and got low 90s for SpO2 at primary care. EMS reports RA 80%, and applied 3L [place] @ 95. Patient denies SOB, chest pain, n/v/d. Used to be on home O2 but reports she took herself off of it. Reports hx of CHF. Patient A&O X4.   EMS reported VS:  128/66  70 95% 3L   Allergies No Known Allergies  Level of Care/Admitting Diagnosis ED Disposition     ED Disposition  Admit   Condition  --   Comment  Hospital Area: [facility] [[id number]]  Level of Care: Telemetry Cardiac [[id number]]  May admit patient to [facility] or [facility] if equivalent level of care is available:: No  Covid Evaluation: Asymptomatic - no recent exposure (last 10 days) testing not required  Diagnosis: Acute diastolic CHF (congestive heart failure) [facility]) [[id number]]  Admitting Physician: Therisa Doyne [[id number]]  Attending Physician: Therisa Doyne [[id number]]  Certification:: I certify this patient will need inpatient services for at least 2 midnights  Estimated Length of Stay: 2          B Medical/Surgery History Past Medical History:  Diagnosis Date   CHF (congestive heart failure) (HCC)    Hypertension    Hypothyroidism 03/29/2017   Obesity (BMI 30-39.9)    Premature ventricular contractions    Past Surgical History:  Procedure Laterality Date   ABDOMINAL HYSTERECTOMY     CHOLECYSTECTOMY     COLONOSCOPY WITH PROPOFOL N/A  12/29/2021   Procedure: COLONOSCOPY WITH PROPOFOL;  Surgeon: Kathi Der, MD;  Location: [facility];  Service: Gastroenterology;  Laterality: N/A;   ENDOVENOUS ABLATION SAPHENOUS VEIN W/ LASER Right 01/26/2022   endovenous laser ablation right greater saphenous vein by Cari Caraway MD   TONSILLECTOMY     TUBAL LIGATION       A IV Location/Drains/Wounds Patient Lines/Drains/Airways Status     Active Line/Drains/Airways     Name Placement date Placement time Site Days   Peripheral IV 11/12/22 20 G 1.88" Anterior;Right Forearm 11/12/22  1822  Forearm  38   Peripheral IV 12/19/22 20 G 1.88" Anterior;Left Forearm 12/19/22  1600  Forearm  1   Peripheral IV 12/19/22 20 G 1.88" Left Antecubital 12/19/22  1615  Antecubital  1   Pressure Injury 11/10/22 Perineum Mid Stage 2 -  Partial thickness loss of dermis presenting as a shallow open injury with a red, pink wound bed without slough. MASD 11/10/22  0336  -- 40            Intake/Output Last 24 hours  Intake/Output Summary (Last 24 hours) at 12/20/2022 1007 Last data filed at 12/20/2022 6295 Gross per 24 hour  Intake 610.04 ml  Output 1225 ml  Net -614.96 ml    Labs/Imaging Results for orders placed or performed during the hospital encounter of 12/19/22 (from the past 48 hour(s))  CBC with Differential/Platelet     Status: Abnormal   Collection Time: 12/19/22 12:58 PM  Result Value Ref Range   WBC 4.4 4.0 - 10.5 K/uL   RBC 3.32 (L) 3.87 - 5.11 MIL/uL   Hemoglobin 9.0 (L) 12.0 - 15.0 g/dL   HCT 40.9 (L) 81.1 - 91.4 %   MCV 91.9 80.0 - 100.0 fL   MCH 27.1 26.0 - 34.0 pg   MCHC 29.5 (L) 30.0 - 36.0 g/dL   RDW 78.2 (H) 95.6 - 21.3 %   Platelets 228 150 - 400 K/uL   nRBC 0.0 0.0 - 0.2 %   Neutrophils Relative % 64 %   Neutro Abs 2.8 1.7 - 7.7 K/uL   Lymphocytes Relative 23 %   Lymphs Abs 1.0 0.7 - 4.0 K/uL   Monocytes Relative 11 %   Monocytes Absolute 0.5 0.1 - 1.0 K/uL   Eosinophils Relative 1 %   Eosinophils  Absolute 0.0 0.0 - 0.5 K/uL   Basophils Relative 1 %   Basophils Absolute 0.0 0.0 - 0.1 K/uL   Immature Granulocytes 0 %   Abs Immature Granulocytes 0.01 0.00 - 0.07 K/uL    Comment: Performed at [facility], [address] [place], [place] [address]  Comprehensive metabolic panel     Status: Abnormal   Collection Time: 12/19/22 12:58 PM  Result Value Ref Range   Sodium 138 135 - 145 mmol/L   Potassium 2.8 (L) 3.5 - 5.1 mmol/L    Comment: REPEATED TO VERIFY   Chloride 109 98 - 111 mmol/L   CO2 20 (L) 22 - 32 mmol/L   Glucose, Bld 79 70 - 99 mg/dL    Comment: Glucose reference range applies only to samples taken after fasting for at least 8 hours.   BUN 12 8 - 23 mg/dL   Creatinine, Ser 8.46 (H) 0.44 - 1.00 mg/dL   Calcium 8.0 (L) 8.9 - 10.3 mg/dL   Total Protein 7.0 6.5 - 8.1 g/dL   Albumin 2.2 (L) 3.5 - 5.0 g/dL   AST 26 15 - 41 U/L   ALT 11 0 - 44 U/L   Alkaline Phosphatase 45 38 - 126 U/L   Total Bilirubin 0.5 0.3 - 1.2 mg/dL   GFR, Estimated 41 (L) >60 mL/min    Comment: (NOTE) Calculated using the CKD-EPI Creatinine Equation (2021)    Anion gap 9 5 - 15    Comment: Performed at [facility], [address] [place], [place] [address]  Troponin I (High Sensitivity)     Status: Abnormal   Collection Time: 12/19/22 12:58 PM  Result Value Ref Range   Troponin I (High Sensitivity) 24 (H) <18 ng/L    Comment: (NOTE) Elevated high sensitivity troponin I (hsTnI) values and significant  changes across serial measurements may suggest ACS but many other  chronic and acute conditions are known to elevate hsTnI results.  Refer to the "Links" section for chest pain algorithms and additional  guidance. Performed at [facility], [address] [place], [place] [address]   D-dimer, quantitative     Status: Abnormal   Collection Time: 12/19/22  1:10 PM  Result Value Ref Range   D-Dimer, Quant 3.02 (H) 0.00 - 0.50 ug/mL-FEU    Comment: (NOTE) At the  manufacturer cut-off value of 0.5 g/mL FEU, this assay has a negative predictive value of 95-100%.This assay is intended for use in conjunction with a clinical pretest probability (PTP) assessment model to exclude pulmonary embolism (PE) and deep venous thrombosis (DVT) in outpatients suspected of PE or DVT.  Results should be correlated with clinical presentation. Performed at [facility], [address] [place], [place] [address]   Troponin I (High Sensitivity)     Status: Abnormal   Collection Time: 12/19/22  3:45 PM  Result Value Ref Range   Troponin I (High Sensitivity) 24 (H) <18 ng/L    Comment: (NOTE) Elevated high sensitivity troponin I (hsTnI) values and significant  changes across serial measurements may suggest ACS but many other  chronic and acute conditions are known to elevate hsTnI results.  Refer to the "Links" section for chest pain algorithms and additional  guidance. Performed at [facility], [address], [place], [place] [address]   CBG monitoring, ED     Status: None   Collection Time: 12/19/22  8:53 PM  Result Value Ref Range   Glucose-Capillary 79 70 - 99 mg/dL    Comment: Glucose reference range applies only to samples taken after fasting for at least 8 hours.  Creatinine, urine, random     Status: None   Collection Time: 12/19/22  9:12 PM  Result Value Ref Range   Creatinine, Urine <10 mg/dL    Comment: Performed at [facility], [address], [place], [place] [address]  Osmolality, urine     Status: Abnormal   Collection Time: 12/19/22  9:12 PM  Result Value Ref Range   Osmolality, Ur 282 (L) 300 - 900 mOsm/kg    Comment: Performed at [facility], [address], [place], [place] [address]  Sodium, urine, random     Status: None   Collection Time: 12/19/22  9:12 PM  Result Value Ref Range   Sodium, Ur 129 mmol/L    Comment: Performed at [facility], [address] [place], [place] [address]  CBG monitoring,  ED     Status: None   Collection Time: 12/19/22  9:13 PM  Result Value Ref Range   Glucose-Capillary 75 70 - 99 mg/dL    Comment: Glucose reference range applies only to samples taken after fasting for at least 8 hours.  CBG monitoring, ED     Status: None   Collection Time: 12/19/22  9:59 PM  Result Value Ref Range   Glucose-Capillary 91 70 - 99 mg/dL    Comment: Glucose reference range applies only to samples taken after fasting for at least 8 hours.  Brain natriuretic peptide     Status: Abnormal   Collection Time: 12/19/22 11:33 PM  Result Value Ref Range   B Natriuretic Peptide 649.5 (H) 0.0 - 100.0 pg/mL    Comment: Performed at [facility], [address] [place], [place] [address]  Heparin level (unfractionated)     Status: Abnormal   Collection Time: 12/19/22 11:33 PM  Result Value Ref Range   Heparin Unfractionated 0.23 (L) 0.30 - 0.70 IU/mL    Comment: (NOTE) The clinical reportable range upper limit is being lowered to >1.10 to align with the FDA approved guidance for the current laboratory assay.  If heparin results are below expected values, and patient dosage has  been confirmed, suggest follow up testing of antithrombin III levels. Performed at [facility], [address] [place], [place] [address]   Basic metabolic panel     Status: Abnormal   Collection Time: 12/19/22 11:33 PM  Result Value Ref Range   Sodium 139 135 - 145 mmol/L   Potassium 2.5 (LL) 3.5 - 5.1 mmol/L  Comment: CRITICAL RESULT CALLED TO, READ BACK BY AND VERIFIED WITH WHITLEY, J. RN @ 0110 12/20/22 JBUTLER   Chloride 108 98 - 111 mmol/L   CO2 22 22 - 32 mmol/L   Glucose, Bld 99 70 - 99 mg/dL    Comment: Glucose reference range applies only to samples taken after fasting for at least 8 hours.   BUN 11 8 - 23 mg/dL   Creatinine, Ser 8.29 (H) 0.44 - 1.00 mg/dL   Calcium 8.0 (L) 8.9 - 10.3 mg/dL   GFR, Estimated 49 (L) >60 mL/min    Comment: (NOTE) Calculated using the CKD-EPI  Creatinine Equation (2021)    Anion gap 9 5 - 15    Comment: Performed at [facility], [address], [place], [place] [address]  Magnesium     Status: Abnormal   Collection Time: 12/19/22 11:33 PM  Result Value Ref Range   Magnesium 1.6 (L) 1.7 - 2.4 mg/dL    Comment: Performed at [facility], [address] [place], [place] [address]  Phosphorus     Status: None   Collection Time: 12/19/22 11:33 PM  Result Value Ref Range   Phosphorus 2.5 2.5 - 4.6 mg/dL    Comment: Performed at [facility], [address] [place], [place] [address]  TSH     Status: Abnormal   Collection Time: 12/19/22 11:33 PM  Result Value Ref Range   TSH <0.010 (L) 0.350 - 4.500 uIU/mL    Comment: Performed by a 3rd Generation assay with a functional sensitivity of <=0.01 uIU/mL. Performed at [facility], [address], [place], [place] [address]   Osmolality     Status: Abnormal   Collection Time: 12/19/22 11:33 PM  Result Value Ref Range   Osmolality 305 (H) 275 - 295 mOsm/kg    Comment: Performed at [facility], [address], [place], [place] [address]  Hemoglobin A1c     Status: Abnormal   Collection Time: 12/19/22 11:33 PM  Result Value Ref Range   Hgb A1c MFr Bld 4.1 (L) 4.8 - 5.6 %    Comment: (NOTE) Pre diabetes:          5.7%-6.4%  Diabetes:              >6.4%  Glycemic control for   <7.0% adults with diabetes    Mean Plasma Glucose 70.97 mg/dL    Comment: Performed at [facility], [address], [place], [place] [address]  Ferritin     Status: None   Collection Time: 12/19/22 11:33 PM  Result Value Ref Range   Ferritin 35 11 - 307 ng/mL    Comment: Performed at [facility], [address] [place], [place] [address]  Reticulocytes     Status: Abnormal   Collection Time: 12/19/22 11:33 PM  Result Value Ref Range   Retic Ct Pct 3.0 0.4 - 3.1 %   RBC. 3.30 (L) 3.87 - 5.11 MIL/uL   Retic Count, Absolute 99.3 19.0 - 186.0 K/uL    Immature Retic Fract 21.8 (H) 2.3 - 15.9 %    Comment: Performed at [facility], [address], [place], [place] [address]  T4, free     Status: Abnormal   Collection Time: 12/19/22 11:33 PM  Result Value Ref Range   Free T4 1.13 (H) 0.61 - 1.12 ng/dL    Comment: (NOTE) Biotin ingestion may interfere with free T4  tests. If the results are inconsistent with the TSH level, previous test results, or the clinical presentation, then consider biotin interference. If needed, order repeat testing after stopping biotin. Performed at [facility], [address] [place], [place] [address]   CBG monitoring, ED     Status: None   Collection Time: 12/20/22 12:14 AM  Result Value Ref Range   Glucose-Capillary 97 70 - 99 mg/dL    Comment: Glucose reference range applies only to samples taken after fasting for at least 8 hours.  CBG monitoring, ED     Status: None   Collection Time: 12/20/22  3:41 AM  Result Value Ref Range   Glucose-Capillary 80 70 - 99 mg/dL    Comment: Glucose reference range applies only to samples taken after fasting for at least 8 hours.  Heparin level (unfractionated)     Status: None   Collection Time: 12/20/22  4:44 AM  Result Value Ref Range   Heparin Unfractionated 0.38 0.30 - 0.70 IU/mL    Comment: (NOTE) The clinical reportable range upper limit is being lowered to >1.10 to align with the FDA approved guidance for the current laboratory assay.  If heparin results are below expected values, and patient dosage has  been confirmed, suggest follow up testing of antithrombin III levels. Performed at [facility], [address] [place], [place] [address]   Vitamin B12     Status: None   Collection Time: 12/20/22  4:44 AM  Result Value Ref Range   Vitamin B-12 834 180 - 914 pg/mL    Comment: (NOTE) This assay is not validated for testing neonatal or myeloproliferative syndrome specimens for Vitamin B12 levels. Performed at [facility]  Lab, [address], [place], [place] [address]   Folate     Status: None   Collection Time: 12/20/22  4:44 AM  Result Value Ref Range   Folate 38.7 >5.9 ng/mL    Comment: Performed at [facility], [address] [place], [place] [address]  Iron and TIBC     Status: Abnormal   Collection Time: 12/20/22  4:44 AM  Result Value Ref Range   Iron 14 (L) 28 - 170 ug/dL   TIBC 536 (L) 644 - 034 ug/dL   Saturation Ratios 7 (L) 10.4 - 31.8 %   UIBC 182 ug/dL    Comment: Performed at [facility], [address] [place], [place] [address]  CBG monitoring, ED     Status: None   Collection Time: 12/20/22  8:08 AM  Result Value Ref Range   Glucose-Capillary 70 70 - 99 mg/dL    Comment: Glucose reference range applies only to samples taken after fasting for at least 8 hours.   Comment 1 Document in Chart   Basic metabolic panel     Status: Abnormal   Collection Time: 12/20/22  9:00 AM  Result Value Ref Range   Sodium 140 135 - 145 mmol/L   Potassium 3.2 (L) 3.5 - 5.1 mmol/L   Chloride 111 98 - 111 mmol/L   CO2 21 (L) 22 - 32 mmol/L   Glucose, Bld 81 70 - 99 mg/dL    Comment: Glucose reference range applies only to samples taken after fasting for at least 8 hours.   BUN 11 8 - 23 mg/dL   Creatinine, Ser 5.63 (H) 0.44 - 1.00 mg/dL   Calcium 7.8 (L) 8.9 - 10.3 mg/dL   GFR, Estimated 52 (L) >60  mL/min    Comment: (NOTE) Calculated using the CKD-EPI Creatinine Equation (2021)    Anion gap 8 5 - 15    Comment: Performed at [facility], [address], [place], [place] [address]  Magnesium     Status: None   Collection Time: 12/20/22  9:00 AM  Result Value Ref Range   Magnesium 2.0 1.7 - 2.4 mg/dL    Comment: Performed at [facility], [address], [place], [place] [address]  CBG monitoring, ED     Status: None   Collection Time: 12/20/22  9:40 AM  Result Value Ref Range   Glucose-Capillary 79 70 - 99 mg/dL    Comment: Glucose reference range applies only to  samples taken after fasting for at least 8 hours.   Comment 1 Notify RN    Comment 2 Document in Chart    VAS [place] LOWER EXTREMITY VENOUS (DVT)  Result Date: 12/19/2022  Lower Venous DVT Study Patient Name:  Michaela Baker [facility]  Date of Exam:   12/19/2022 Medical Rec #: [id number]      Accession #:    [id number] Date of Birth: [birth year]      Patient Gender: F Patient Age:   59 years Exam Location:  [facility] Procedure:      VAS [place] LOWER EXTREMITY VENOUS (DVT) Referring Phys: Nehemiah Settle SMALL --------------------------------------------------------------------------------  Indications: Swelling, and SOB.  Risk Factors: History of right LE reflux disease. S/P right GSV ablation 01/26/22. Limitations: Body habitus and edema. Comparison Study: Prior negative right LEV Performing Technologist: Sherren Kerns RVS  Examination Guidelines: A complete evaluation includes B-mode imaging, spectral Doppler, color Doppler, and power Doppler as needed of all accessible portions of each vessel. Bilateral testing is considered an integral part of a complete examination. Limited examinations for reoccurring indications may be performed as noted. The reflux portion of the exam is performed with the patient in reverse Trendelenburg.  +---------+---------------+---------+-----------+--------------+---------------+ RIGHT    CompressibilityPhasicitySpontaneityProperties    Thrombus Aging  +---------+---------------+---------+-----------+--------------+---------------+ CFV      Partial                            pulsatile flowAge                                                                       Indeterminate   +---------+---------------+---------+-----------+--------------+---------------+ FV Prox  Full                                                             +---------+---------------+---------+-----------+--------------+---------------+ FV Mid   Full                                                              +---------+---------------+---------+-----------+--------------+---------------+ FV Distal  pulsatile flowpatent by color                                                           and Doppler     +---------+---------------+---------+-----------+--------------+---------------+ PFV                                                       Not well                                                                  visualized      +---------+---------------+---------+-----------+--------------+---------------+ POP      Partial        No       No                       Age                                                                       Indeterminate   +---------+---------------+---------+-----------+--------------+---------------+ PTV                                                       Not well                                                                  visualized      +---------+---------------+---------+-----------+--------------+---------------+ PERO                                                      Not well                                                                  visualized      +---------+---------------+---------+-----------+--------------+---------------+   +----------+---------------+---------+-----------+----------+-----------------+ LEFT      CompressibilityPhasicitySpontaneityPropertiesThrombus Aging    +----------+---------------+---------+-----------+----------+-----------------+ CFV       None  No       No                   Age Indeterminate +----------+---------------+---------+-----------+----------+-----------------+ SFJ       Full                                                           +----------+---------------+---------+-----------+----------+-----------------+ FV Prox   None                                         Age  Indeterminate +----------+---------------+---------+-----------+----------+-----------------+ FV Mid    None                                         Age Indeterminate +----------+---------------+---------+-----------+----------+-----------------+ FV Distal None                                         Age Indeterminate +----------+---------------+---------+-----------+----------+-----------------+ PFV       Full                                                           +----------+---------------+---------+-----------+----------+-----------------+ POP       None           No       No                   Age Indeterminate +----------+---------------+---------+-----------+----------+-----------------+ PTV       None                                         Acute             +----------+---------------+---------+-----------+----------+-----------------+ PERO                                                   Not well                                                                 visualized        +----------+---------------+---------+-----------+----------+-----------------+ EIV                                                    patent            +----------+---------------+---------+-----------+----------+-----------------+  Distal CIV                                             patent            +----------+---------------+---------+-----------+----------+-----------------+     Summary: RIGHT: - Findings consistent with age indeterminate deep vein thrombosis involving the right common femoral vein, and right popliteal vein. - No cystic structure found in the popliteal fossa.  LEFT: - Findings consistent with age indeterminate deep vein thrombosis involving the left common femoral vein, left femoral vein, left popliteal vein, and left posterior tibial veins. - No cystic structure found in the popliteal fossa.  *See table(s) above for measurements and  observations. Electronically signed by Lemar Livings MD on 12/19/2022 at 7:04:33 PM.    Final    CT Angio Chest PE W and/or Wo Contrast  Result Date: 12/19/2022 CLINICAL DATA:  Positive D-dimer. EXAM: CT ANGIOGRAPHY CHEST WITH CONTRAST TECHNIQUE: Multidetector CT imaging of the chest was performed using the standard protocol during bolus administration of intravenous contrast. Multiplanar CT image reconstructions and MIPs were obtained to evaluate the vascular anatomy. RADIATION DOSE REDUCTION: This exam was performed according to the departmental dose-optimization program which includes automated exposure control, adjustment of the mA and/or kV according to patient size and/or use of iterative reconstruction technique. CONTRAST:  75mL OMNIPAQUE IOHEXOL 350 MG/ML SOLN COMPARISON:  November 09, 2022. FINDINGS: Cardiovascular: Satisfactory opacification of the pulmonary arteries to the segmental level. No evidence of pulmonary embolism. Mild cardiomegaly. No pericardial effusion. Coronary artery calcifications are noted. Mediastinum/Nodes: 2.4 cm right thyroid nodule is noted. No adenopathy is noted. The esophagus is unremarkable. Lungs/Pleura: Small bilateral pleural effusions are noted with adjacent subsegmental atelectasis. Interstitial densities are noted throughout both lungs concerning for edema or possibly atypical pneumonia. Cavitary lesion noted in right upper lobe on prior exam is significantly smaller currently, measuring 3.5 x 2.5 cm. Upper Abdomen: No acute abnormality. Musculoskeletal: No chest wall abnormality. No acute or significant osseous findings. Review of the MIP images confirms the above findings. IMPRESSION: No definite evidence of pulmonary embolus. 2.4 cm right thyroid nodule is noted. Recommend thyroid US. (Ref: J Am Coll Radiol. 2015 Feb;12(2): 143-50). Small bilateral pleural effusions are noted with adjacent subsegmental atelectasis. Interstitial densities are noted throughout both lungs  concerning for edema or possibly atypical inflammation. Cavitary lesion seen in right upper lobe on prior exam is significantly smaller currently, consistent with improving cavitary pneumonia. Coronary artery calcifications are noted. Aortic Atherosclerosis (ICD10-I70.0) and Emphysema (ICD10-J43.9). Electronically Signed   By: Lupita Raider M.D.   On: 12/19/2022 18:00   DG Chest 2 View  Result Date: 12/19/2022 CLINICAL DATA:  Hypoxia.  CHF and leg swelling EXAM: CHEST - 2 VIEW COMPARISON:  Chest x-ray 11/09/2022 FINDINGS: Calcified aorta. Normal cardiopericardial silhouette. Interstitial changes are noted. Some subtle patchy opacity in the right mid to lower lung as well. Infiltrates possible. Tiny pleural effusions. No pneumothorax. Overlapping cardiac leads. IMPRESSION: Tiny effusions. Interstitial changes noted bilaterally with some more focal opacity in the right mid to lower lung. Infiltrates possible. Recommend short follow-up. Electronically Signed   By: Karen Kays M.D.   On: 12/19/2022 14:21    Pending Labs Unresulted Labs (From admission, onward)     Start     Ordered   12/20/22 1300  Heparin level (unfractionated)  Once-Timed,   TIMED  Question:  Specimen collection method  Answer:  IV Team=IV Team collect   12/20/22 1610   12/20/22 0500  Heparin level (unfractionated)  Daily,   R      12/19/22 1506   12/20/22 0000  T3  Once,   AD        12/20/22 0000   12/19/22 1224  CBC with Differential  Once,   STAT        12/19/22 1224   Signed and Held  Magnesium  Tomorrow morning,   R       Question:  Specimen collection method  Answer:  IV Team=IV Team collect   Signed and Held   Signed and Held  Phosphorus  Tomorrow morning,   R       Question:  Specimen collection method  Answer:  IV Team=IV Team collect   Signed and Held   Signed and Held  Comprehensive metabolic panel  Tomorrow morning,   R       Question Answer Comment  Specimen collection method IV Team=IV Team collect    Release to patient Immediate      Signed and Held   Signed and Held  CBC  Tomorrow morning,   R       Question Answer Comment  Specimen collection method IV Team=IV Team collect   Release to patient Immediate      Signed and Held   Signed and Held  Brain natriuretic peptide  (Heart Failure)  Add-on,   R       Question:  Specimen collection method  Answer:  IV Team=IV Team collect   Signed and Held            Vitals/Pain Today's Vitals   12/20/22 0800 12/20/22 0830 12/20/22 0951 12/20/22 1000  BP: 122/63 123/64 132/70 134/63  Pulse: 67 72 76 75  Resp: 18 (!) 31 13 (!) 26  Temp:   99.1 F (37.3 C)   TempSrc:   Oral   SpO2: 97% 97% 96% 98%  Weight:      Height:      PainSc:        Isolation Precautions No active isolations  Medications Medications  heparin ADULT infusion 100 units/mL (25000 units/256mL) (1,350 Units/hr Intravenous New Bag/Given 12/20/22 1003)  potassium chloride SA (KLOR-CON M) CR tablet 40 mEq (40 mEq Oral Not Given 12/19/22 2325)  furosemide (LASIX) injection 40 mg (40 mg Intravenous Given 12/20/22 1000)  heparin bolus via infusion 2,250 Units (2,250 Units Intravenous Bolus from Bag 12/19/22 1638)  sodium chloride 0.9 % bolus 250 mL (0 mLs Intravenous Stopped 12/19/22 1720)  potassium chloride 10 mEq in 100 mL IVPB (0 mEq Intravenous Stopped 12/19/22 2321)  iohexol (OMNIPAQUE) 350 MG/ML injection 75 mL (75 mLs Intravenous Contrast Given 12/19/22 1737)  furosemide (LASIX) injection 40 mg (40 mg Intravenous Given 12/19/22 2003)  heparin bolus via infusion 1,100 Units (1,100 Units Intravenous Bolus from Bag 12/20/22 0050)  magnesium sulfate IVPB 2 g 50 mL (0 g Intravenous Stopped 12/20/22 0342)  potassium chloride SA (KLOR-CON M) CR tablet 40 mEq (40 mEq Oral Given 12/20/22 0235)  potassium chloride 10 mEq in 100 mL IVPB (0 mEq Intravenous Stopped 12/20/22 0711)    Mobility walks     Focused Assessments Pulmonary Assessment Handoff:  Lung sounds:  Bilateral Breath Sounds: Clear L Breath Sounds: Clear R Breath Sounds: Clear O2 Device: Room Air O2 Flow Rate (L/min): 3 L/min    R Recommendations: See Admitting Provider Note  Report given to:   Additional Notes:

## 2022-12-20 NOTE — Assessment & Plan Note (Signed)
Nutrition consult as an out pt

## 2022-12-20 NOTE — Progress Notes (Signed)
[facility]                                                                               Michaela Baker, is a 72 y.o. female, DOB - 1950/06/07, [id number] Admit date - 12/19/2022    Outpatient Primary MD for the patient is Diamantina Providence, FNP  LOS - 1  days    Brief summary    72 y.o. female with medical history significant of CHF, CKD, hypothyroidism,   Kidney ge folic acid deficiency, hyperlipidemia, CKD,  history of aspiration pneumonia, HTN, OSA presented with   lower extremity edema and sob.    Assessment & Plan   Acute on chronic  diastolic CHF BNP of 649, minimally elevated troponins.  Started the patient on IV lasix 40 mg daily, .  Echocardiogram ordered and pending. Pt non compliance to meds.  Good diuresis of 1225 ml after a dose of lasix.  Strict intake and output.  Daily weights. Continue with BB.  Check ambulating oxygen levels in am.  Therapy evaluations ordered.     Hypokalemia:  Replaced and repeat in am.     Acute bilateral DVT:  Started on IV heparin.  Venous duplex showed bilateral DVT.    Iron deficiency anemia:  Plan for IV iron infusion prior to discharge.    Elevated troponins probably from demand ischemia from CHF.   Body mass index is 31.99 kg/m. Obesity.    Hypothyroidism; Follow thyroid panel and dose synthroid.       Hyperlipidemia;  Resume statin.       RN Pressure Injury Documentation: Pressure Injury 11/10/22 Perineum Mid Stage 2 -  Partial thickness loss of dermis presenting as a shallow open injury with a red, pink wound bed without slough. MASD (Active)  11/10/22 0336  Location: Perineum  Location Orientation: Mid  Staging: Stage 2 -  Partial thickness loss of dermis presenting as a shallow open injury with a red, pink wound bed without slough.  Wound Description (Comments): MASD  Present on Admission: Yes     Estimated body mass index is 32.45 kg/m as calculated from the following:    Height as of this encounter: 5\' 5"  (1.651 m).   Weight as of this encounter: 88.5 kg.  Code Status: full code.  DVT Prophylaxis:  heparin.    Level of Care: Level of care: Telemetry Cardiac Family Communication:none at bedside.   Disposition Plan:     Remains inpatient appropriate:  IV lasix.   Procedures:  Echo Venous duplex.   Consultants:   None.   Antimicrobials:   Anti-infectives (From admission, onward)    None        Medications  Scheduled Meds:  furosemide  40 mg Intravenous BID   insulin aspart  0-9 Units Subcutaneous Q4H   potassium chloride  40 mEq Oral Once   Continuous Infusions:  heparin 1,350 Units/hr (12/20/22 0625)   PRN Meds:.    Subjective:   Michaela Baker was seen and examined today.  Reports feeling much better, and weaned off oxygen.   Objective:   Vitals:   12/20/22 0615 12/20/22 0620 12/20/22 0625 12/20/22 0630  BP:  129/62  Pulse: 72 72 73 72  Resp: 18 17 20 20   Temp:    98.2 F (36.8 C)  TempSrc:    Oral  SpO2: 100% 100% 100% 100%  Weight:      Height:        Intake/Output Summary (Last 24 hours) at 12/20/2022 0941 Last data filed at 12/20/2022 0711 Gross per 24 hour  Intake 610.04 ml  Output 1225 ml  Net -614.96 ml   Filed Weights   12/19/22 1208  Weight: 88.5 kg     Exam General exam: Appears calm and comfortable  Respiratory system: Clear to auscultation. Respiratory effort normal. Cardiovascular system: S1 & S2 heard, RRR. No JVD,  Gastrointestinal system: Abdomen is nondistended, soft and nontender.  Central nervous system: Alert and oriented. No focal neurological deficits. Extremities: pedal edema.  Skin: No rashes,  Psychiatry:mood is appropriate.    Data Reviewed:  I have personally reviewed following labs and imaging studies   CBC Lab Results  Component Value Date   WBC 4.4 12/19/2022   RBC 3.30 (L) 12/19/2022   HGB 9.0 (L) 12/19/2022   HCT 30.5 (L) 12/19/2022   MCV 91.9 12/19/2022   MCH  27.1 12/19/2022   PLT 228 12/19/2022   MCHC 29.5 (L) 12/19/2022   RDW 18.8 (H) 12/19/2022   LYMPHSABS 1.0 12/19/2022   MONOABS 0.5 12/19/2022   EOSABS 0.0 12/19/2022   BASOSABS 0.0 12/19/2022     Last metabolic panel Lab Results  Component Value Date   NA 139 12/19/2022   K 2.5 (LL) 12/19/2022   CL 108 12/19/2022   CO2 22 12/19/2022   BUN 11 12/19/2022   CREATININE 1.18 (H) 12/19/2022   GLUCOSE 99 12/19/2022   GFRNONAA 49 (L) 12/19/2022   GFRAA >60 03/29/2017   CALCIUM 8.0 (L) 12/19/2022   PHOS 2.5 12/19/2022   PROT 7.0 12/19/2022   ALBUMIN 2.2 (L) 12/19/2022   BILITOT 0.5 12/19/2022   ALKPHOS 45 12/19/2022   AST 26 12/19/2022   ALT 11 12/19/2022   ANIONGAP 9 12/19/2022    CBG (last 3)  Recent Labs    12/20/22 0014 12/20/22 0341 12/20/22 0808  GLUCAP 97 80 70      Coagulation Profile: No results for input(s): "INR", "PROTIME" in the last 168 hours.   Radiology Studies: VAS [place] LOWER EXTREMITY VENOUS (DVT)  Result Date: 12/19/2022  Lower Venous DVT Study Patient Name:  Michaela Baker  Date of Exam:   12/19/2022 Medical Rec #: [id number]      Accession #:    [id number] Date of Birth: [birth year]      Patient Gender: F Patient Age:   31 years Exam Location:  [facility] Procedure:      VAS [place] LOWER EXTREMITY VENOUS (DVT) Referring Phys: Nehemiah Settle SMALL --------------------------------------------------------------------------------  Indications: Swelling, and SOB.  Risk Factors: History of right LE reflux disease. S/P right GSV ablation 01/26/22. Limitations: Body habitus and edema. Comparison Study: Prior negative right LEV Performing Technologist: Sherren Kerns RVS  Examination Guidelines: A complete evaluation includes B-mode imaging, spectral Doppler, color Doppler, and power Doppler as needed of all accessible portions of each vessel. Bilateral testing is considered an integral part of a complete examination. Limited examinations for reoccurring indications may be  performed as noted. The reflux portion of the exam is performed with the patient in reverse Trendelenburg.  +---------+---------------+---------+-----------+--------------+---------------+ RIGHT    CompressibilityPhasicitySpontaneityProperties    Thrombus Aging  +---------+---------------+---------+-----------+--------------+---------------+ CFV      Partial  pulsatile flowAge                                                                       Indeterminate   +---------+---------------+---------+-----------+--------------+---------------+ FV Prox  Full                                                             +---------+---------------+---------+-----------+--------------+---------------+ FV Mid   Full                                                             +---------+---------------+---------+-----------+--------------+---------------+ FV Distal                                   pulsatile flowpatent by color                                                           and Doppler     +---------+---------------+---------+-----------+--------------+---------------+ PFV                                                       Not well                                                                  visualized      +---------+---------------+---------+-----------+--------------+---------------+ POP      Partial        No       No                       Age                                                                       Indeterminate   +---------+---------------+---------+-----------+--------------+---------------+ PTV  Not well                                                                  visualized      +---------+---------------+---------+-----------+--------------+---------------+ PERO                                                       Not well                                                                  visualized      +---------+---------------+---------+-----------+--------------+---------------+   +----------+---------------+---------+-----------+----------+-----------------+ LEFT      CompressibilityPhasicitySpontaneityPropertiesThrombus Aging    +----------+---------------+---------+-----------+----------+-----------------+ CFV       None           No       No                   Age Indeterminate +----------+---------------+---------+-----------+----------+-----------------+ SFJ       Full                                                           +----------+---------------+---------+-----------+----------+-----------------+ FV Prox   None                                         Age Indeterminate +----------+---------------+---------+-----------+----------+-----------------+ FV Mid    None                                         Age Indeterminate +----------+---------------+---------+-----------+----------+-----------------+ FV Distal None                                         Age Indeterminate +----------+---------------+---------+-----------+----------+-----------------+ PFV       Full                                                           +----------+---------------+---------+-----------+----------+-----------------+ POP       None           No       No                   Age Indeterminate +----------+---------------+---------+-----------+----------+-----------------+ PTV       None  Acute             +----------+---------------+---------+-----------+----------+-----------------+ PERO                                                   Not well                                                                 visualized        +----------+---------------+---------+-----------+----------+-----------------+ EIV                                                     patent            +----------+---------------+---------+-----------+----------+-----------------+ Distal CIV                                             patent            +----------+---------------+---------+-----------+----------+-----------------+     Summary: RIGHT: - Findings consistent with age indeterminate deep vein thrombosis involving the right common femoral vein, and right popliteal vein. - No cystic structure found in the popliteal fossa.  LEFT: - Findings consistent with age indeterminate deep vein thrombosis involving the left common femoral vein, left femoral vein, left popliteal vein, and left posterior tibial veins. - No cystic structure found in the popliteal fossa.  *See table(s) above for measurements and observations. Electronically signed by Lemar Livings MD on 12/19/2022 at 7:04:33 PM.    Final    CT Angio Chest PE W and/or Wo Contrast  Result Date: 12/19/2022 CLINICAL DATA:  Positive D-dimer. EXAM: CT ANGIOGRAPHY CHEST WITH CONTRAST TECHNIQUE: Multidetector CT imaging of the chest was performed using the standard protocol during bolus administration of intravenous contrast. Multiplanar CT image reconstructions and MIPs were obtained to evaluate the vascular anatomy. RADIATION DOSE REDUCTION: This exam was performed according to the departmental dose-optimization program which includes automated exposure control, adjustment of the mA and/or kV according to patient size and/or use of iterative reconstruction technique. CONTRAST:  75mL OMNIPAQUE IOHEXOL 350 MG/ML SOLN COMPARISON:  November 09, 2022. FINDINGS: Cardiovascular: Satisfactory opacification of the pulmonary arteries to the segmental level. No evidence of pulmonary embolism. Mild cardiomegaly. No pericardial effusion. Coronary artery calcifications are noted. Mediastinum/Nodes: 2.4 cm right thyroid nodule is noted. No adenopathy is noted. The esophagus is unremarkable.  Lungs/Pleura: Small bilateral pleural effusions are noted with adjacent subsegmental atelectasis. Interstitial densities are noted throughout both lungs concerning for edema or possibly atypical pneumonia. Cavitary lesion noted in right upper lobe on prior exam is significantly smaller currently, measuring 3.5 x 2.5 cm. Upper Abdomen: No acute abnormality. Musculoskeletal: No chest wall abnormality. No acute or significant osseous findings. Review of the MIP images confirms the above findings. IMPRESSION: No definite evidence of pulmonary embolus. 2.4 cm right thyroid nodule is noted. Recommend thyroid US. (Ref: J Am Coll Radiol. 2015  Feb;12(2): 143-50). Small bilateral pleural effusions are noted with adjacent subsegmental atelectasis. Interstitial densities are noted throughout both lungs concerning for edema or possibly atypical inflammation. Cavitary lesion seen in right upper lobe on prior exam is significantly smaller currently, consistent with improving cavitary pneumonia. Coronary artery calcifications are noted. Aortic Atherosclerosis (ICD10-I70.0) and Emphysema (ICD10-J43.9). Electronically Signed   By: Lupita Raider M.D.   On: 12/19/2022 18:00   DG Chest 2 View  Result Date: 12/19/2022 CLINICAL DATA:  Hypoxia.  CHF and leg swelling EXAM: CHEST - 2 VIEW COMPARISON:  Chest x-ray 11/09/2022 FINDINGS: Calcified aorta. Normal cardiopericardial silhouette. Interstitial changes are noted. Some subtle patchy opacity in the right mid to lower lung as well. Infiltrates possible. Tiny pleural effusions. No pneumothorax. Overlapping cardiac leads. IMPRESSION: Tiny effusions. Interstitial changes noted bilaterally with some more focal opacity in the right mid to lower lung. Infiltrates possible. Recommend short follow-up. Electronically Signed   By: Karen Kays M.D.   On: 12/19/2022 14:21       Kathlen Mody M.D. [facility] 12/20/2022, 9:41 AM  Available via Epic secure chat 7am-7pm After 7 pm,  please refer to night coverage provider listed on amion.

## 2022-12-20 NOTE — Progress Notes (Signed)
Heart Failure Navigator Progress Note  Assessed for Heart & Vascular TOC clinic readiness.  Patient does not meet criteria due to [facility] patient.   Navigator will sign off at this time.    Dawn Fields, BSN, RN Heart Failure Nurse Navigator Secure Chat Only   

## 2022-12-20 NOTE — Assessment & Plan Note (Signed)
Continue Crestor 5 mg po q day

## 2022-12-20 NOTE — Assessment & Plan Note (Signed)
Chronic repeat echo

## 2022-12-20 NOTE — Progress Notes (Signed)
Echocardiogram 2D Echocardiogram has been performed.  Warren Lacy Sherree Shankman RDCS 12/20/2022, 9:32 AM

## 2022-12-20 NOTE — Evaluation (Signed)
Physical Therapy Evaluation Patient Details Name: Michaela Baker MRN: [id number] DOB: Jul 03, 1950 Today's Date: 12/20/2022  History of Present Illness  Patient is 72 y.o. female presented from PCP for LE edema and hypoxia. PMH significant for HTN, CHF, PVCs, CKD, HLD, OSA, hypothyroidism. CT negative for PE, age indeterminate DVT's noted in bil LE's.   Clinical Impression  Michaela Baker is 72 y.o. female admitted with above HPI and diagnosis. Patient is currently limited by functional impairments below (see PT problem list). Patient lives with granddaughter and is mod independent with RW at baseline. Currently pt is limited by decreased activity tolerance and requires min guard/assist for transfers and gait. Pt desaturating on 3L/min into 70's with gait and required 6L/min to recover >92% with seated rest. Patient will benefit from continued skilled PT interventions to address impairments and progress independence with mobility. Acute PT will follow and progress as able.         Assistance Recommended at Discharge Frequent or constant Supervision/Assistance  If plan is discharge home, recommend the following:  Can travel by private vehicle  A little help with walking and/or transfers;A little help with bathing/dressing/bathroom;Assistance with cooking/housework;Assist for transportation;Help with stairs or ramp for entrance        Equipment Recommendations Rollator (4 wheels) (TBD pendign progress)  Recommendations for Other Services       Functional Status Assessment Patient has had a recent decline in their functional status and demonstrates the ability to make significant improvements in function in a reasonable and predictable amount of time.     Precautions / Restrictions Precautions Precautions: Fall Restrictions Weight Bearing Restrictions: No      Mobility  Bed Mobility Overal bed mobility: Needs Assistance Bed Mobility: Supine to Sit     Supine to sit: Min guard, HOB  elevated     General bed mobility comments: guarding with use of bed features.    Transfers Overall transfer level: Needs assistance Equipment used: Rolling walker (2 wheels) Transfers: Sit to/from Stand Sit to Stand: Min guard, Min assist           General transfer comment: min guard for sit<>stand from EOB. pt using UE at EOB to power up and steady with hand transition to RW.    Ambulation/Gait Ambulation/Gait assistance: Min guard Gait Distance (Feet): 50 Feet Assistive device: Rolling walker (2 wheels) Gait Pattern/deviations: Step-through pattern, Decreased step length - right, Decreased step length - left, Decreased stride length, Trunk flexed Gait velocity: decr     General Gait Details: cues to maintain safe position to RW with turns, assist to manage RW. Pt wiht no buckling, posture flexed with short low steps throughout.  Stairs            Wheelchair Mobility     Tilt Bed    Modified Rankin (Stroke Patients Only)       Balance Overall balance assessment: Needs assistance Sitting-balance support: Feet supported Sitting balance-Leahy Scale: Good     Standing balance support: Bilateral upper extremity supported, During functional activity, Reliant on assistive device for balance Standing balance-Leahy Scale: Poor                               Pertinent Vitals/Pain Pain Assessment Pain Assessment: No/denies pain    Home Living Family/patient expects to be discharged to:: Private residence Living Arrangements: Other relatives (granddaughter and her son) Available Help at Discharge: Available 24 hours/day;Other (Comment) (in and out  at times, but dtr/grd-dtr available) Type of Home: Apartment Home Access: Level entry       Home Layout: One level Home Equipment: Agricultural consultant (2 wheels);Cane - quad;Shower seat;BSC/3in1;Grab bars - toilet;Grab bars - tub/shower      Prior Function Prior Level of Function : Needs assist        Physical Assist : ADLs (physical)   ADLs (physical): Bathing;Dressing;IADLs Mobility Comments: ambulates with RW ADLs Comments: pt reports her dtr assists with bathing, dressing, meals, errands     Hand Dominance   Dominant Hand: Right    Extremity/Trunk Assessment   Upper Extremity Assessment Upper Extremity Assessment: Overall WFL for tasks assessed;Defer to OT evaluation    Lower Extremity Assessment Lower Extremity Assessment: Generalized weakness    Cervical / Trunk Assessment Cervical / Trunk Assessment: Normal;Other exceptions Cervical / Trunk Exceptions: habitus  Communication   Communication: No difficulties  Cognition Arousal/Alertness: Awake/alert Behavior During Therapy: WFL for tasks assessed/performed Overall Cognitive Status: Within Functional Limits for tasks assessed                                          General Comments General comments (skin integrity, edema, etc.): pt on 3L/min at start of session SpO2 86-89%. Pt desat to ~70's% during gait required 6L/min to recover and seated rest. SpO2 recovered to 93% on 6L/min. Pt returned to 3L and unable to maintain sats above 88%, increased to 4L and Rn at bedside.    Exercises Other Exercises Other Exercises: educated on pursed lip breathing to recover as pt desats during and following gait   Assessment/Plan    PT Assessment Patient needs continued PT services  PT Problem List Decreased strength;Decreased activity tolerance;Decreased balance;Decreased mobility;Decreased knowledge of use of DME;Decreased safety awareness;Decreased knowledge of precautions;Cardiopulmonary status limiting activity;Obesity       PT Treatment Interventions DME instruction;Wheelchair mobility training;Patient/family education;Cognitive remediation;Neuromuscular re-education;Balance training;Therapeutic exercise;Therapeutic activities;Functional mobility training;Stair training;Gait training    PT Goals  (Current goals can be found in the Care Plan section)  Acute Rehab PT Goals Patient Stated Goal: not be so SOB PT Goal Formulation: With patient Time For Goal Achievement: 01/03/23 Potential to Achieve Goals: Good    Frequency Min 1X/week     Co-evaluation               AM-PAC PT "6 Clicks" Mobility  Outcome Measure Help needed turning from your back to your side while in a flat bed without using bedrails?: A Little Help needed moving from lying on your back to sitting on the side of a flat bed without using bedrails?: A Little Help needed moving to and from a bed to a chair (including a wheelchair)?: A Little Help needed standing up from a chair using your arms (e.g., wheelchair or bedside chair)?: A Little Help needed to walk in hospital room?: A Little Help needed climbing 3-5 steps with a railing? : Total 6 Click Score: 16    End of Session Equipment Utilized During Treatment: Gait belt;Oxygen Activity Tolerance: Patient tolerated treatment well Patient left: in chair;with call bell/phone within reach;with chair alarm set;with nursing/sitter in room Nurse Communication: Mobility status PT Visit Diagnosis: Muscle weakness (generalized) (M62.81);Other abnormalities of gait and mobility (R26.89);Difficulty in walking, not elsewhere classified (R26.2)    Time: 1610-9604 PT Time Calculation (min) (ACUTE ONLY): 28 min   Charges:   PT Evaluation $PT Eval  Moderate Complexity: 1 Mod PT Treatments $Gait Training: 8-22 mins PT General Charges $$ ACUTE PT VISIT: 1 Visit         Wynn Maudlin, DPT Acute Rehabilitation Services Office [phone]  12/20/22 4:36 PM

## 2022-12-20 NOTE — ED Notes (Signed)
Patient  having testing done.

## 2022-12-20 NOTE — Progress Notes (Signed)
ANTICOAGULATION CONSULT NOTE - Follow Up  Pharmacy Consult for Heparin Indication:  suspected PE >>bilateral DVTs  No Known Allergies  Patient Measurements: Height: 5\' 5"  (165.1 cm) Weight: 88.5 kg (195 lb) IBW/kg (Calculated) : 57 Heparin Dosing Weight: 76.4 kg  Vital Signs: Temp: 98.1 F (36.7 C) (07/22 1958) Temp Source: Oral (07/22 1958) BP: 114/55 (07/23 0000) Pulse Rate: 75 (07/23 0000)  Labs: Recent Labs    12/19/22 1258 12/19/22 1545 12/19/22 2333  HGB 9.0*  --   --   HCT 30.5*  --   --   PLT 228  --   --   HEPARINUNFRC  --   --  0.23*  CREATININE 1.37*  --   --   TROPONINIHS 24* 24*  --     Estimated Creatinine Clearance: 40.8 mL/min (A) (by C-G formula based on SCr of 1.37 mg/dL (H)).   Medical History: Past Medical History:  Diagnosis Date   CHF (congestive heart failure) (HCC)    Hypertension    Hypothyroidism 03/29/2017   Obesity (BMI 30-39.9)    Premature ventricular contractions     Medications:  (Not in a hospital admission)  Scheduled:   insulin aspart  0-9 Units Subcutaneous Q4H   potassium chloride  40 mEq Oral Once   Infusions:   heparin 1,200 Units/hr (12/19/22 1638)   PRN:   Assessment: 71 yof with a history of HF, hx of endovenous laser ablation of rt saphenous vein. Patient is presenting with bilateral lower extremity swelling and SOB. Heparin per pharmacy consult placed for  suspected PE .  Patient is not on anticoagulation prior to arrival. CTA showed no evidence of PE, but has bilateral DVTs.   Hgb 9; plt 228 D-Dimer 3.02  7/22 PM: heparin level returned at 0.23 (subtherapeutic) on 1200 units/h. Per RN, no signs/symptoms of bleeding or issues with the heparin infusion running.  Goal of Therapy:  Heparin level 0.3-0.7 units/ml Monitor platelets by anticoagulation protocol: Yes   Plan:  Give small IV heparin 1100 units bolus x 1 given Hgb 9 Increase heparin infusion to 1350 units/hr Check anti-Xa level in 8 hours and  daily while on heparin Continue to monitor H&H and platelets  Arabella Merles, PharmD. Clinical Pharmacist 12/20/2022 12:32 AM

## 2022-12-20 NOTE — ED Notes (Signed)
Patient was given a cup of coffee. 

## 2022-12-20 NOTE — Progress Notes (Signed)
ANTICOAGULATION CONSULT NOTE - Follow Up  Pharmacy Consult for Heparin Indication: bilateral DVTs  No Known Allergies  Patient Measurements: Height: 5\' 5"  (165.1 cm) Weight: 87.2 kg (192 lb 3.9 oz) IBW/kg (Calculated) : 57 Heparin Dosing Weight: 76.4 kg  Vital Signs: Temp: 99.4 F (37.4 C) (07/23 1517) Temp Source: Oral (07/23 1517) BP: 125/51 (07/23 1517) Pulse Rate: 84 (07/23 1517)  Labs: Recent Labs    12/19/22 1258 12/19/22 1545 12/19/22 2333 12/20/22 0444 12/20/22 0900 12/20/22 1456  HGB 9.0*  --   --   --   --  8.9*  HCT 30.5*  --   --   --   --  30.9*  PLT 228  --   --   --   --  241  HEPARINUNFRC  --   --  0.23* 0.38  --  0.31  CREATININE 1.37*  --  1.18*  --  1.13* 1.38*  TROPONINIHS 24* 24*  --   --   --   --     Estimated Creatinine Clearance: 40.2 mL/min (A) (by C-G formula based on SCr of 1.38 mg/dL (H)).   Medical History: Past Medical History:  Diagnosis Date   CHF (congestive heart failure) (HCC)    Hypertension    Hypothyroidism 03/29/2017   Obesity (BMI 30-39.9)    Premature ventricular contractions     Medications:  Medications Prior to Admission  Medication Sig Dispense Refill Last Dose   acetaminophen (TYLENOL) 500 MG tablet Take 1,000 mg by mouth as needed (pain).   12/19/2022   amLODipine (NORVASC) 10 MG tablet Take 10 mg by mouth daily.   12/19/2022   aspirin EC 81 MG EC tablet Take 1 tablet (81 mg total) by mouth daily. 30 tablet 1 12/19/2022   dapagliflozin propanediol (FARXIGA) 10 MG TABS tablet Take 10 mg by mouth daily.   12/19/2022   folic acid (FOLVITE) 1 MG tablet Take 1 tablet (1 mg total) by mouth daily. 30 tablet 0 12/19/2022   furosemide (LASIX) 40 MG tablet Take 40 mg by mouth 2 (two) times daily.   Past Week   hydrALAZINE (APRESOLINE) 50 MG tablet Take 50 mg by mouth daily.   12/19/2022   KERENDIA 10 MG TABS take 1 tablet by mouth daily for chronic kidney disease   12/19/2022   levothyroxine (SYNTHROID, LEVOTHROID) 25 MCG  tablet Take 12.5 mcg by mouth daily.  5 12/19/2022   metoprolol succinate (TOPROL-XL) 50 MG 24 hr tablet Take 50 mg by mouth daily. Take with or immediately following a meal.   12/19/2022   Multiple Vitamins-Minerals (MULTIVITAMIN WITH MINERALS) tablet Take 2 tablets by mouth daily.   Past Week   rosuvastatin (CRESTOR) 5 MG tablet Take 5 mg by mouth daily.   12/19/2022   Scheduled:   [START ON 12/21/2022] furosemide  40 mg Intravenous Daily   metoprolol succinate  50 mg Oral Daily   potassium chloride  40 mEq Oral Once   rosuvastatin  5 mg Oral Daily   sodium chloride flush  3 mL Intravenous Q12H   Infusions:   sodium chloride     heparin 1,350 Units/hr (12/20/22 1003)   potassium PHOSPHATE IVPB (in mmol)     PRN: sodium chloride, acetaminophen **OR** acetaminophen, HYDROcodone-acetaminophen, ondansetron **OR** ondansetron (ZOFRAN) IV, sodium chloride flush  Assessment: 72 yo F with a history of HF, hx of endovenous laser ablation of rt saphenous vein. Patient is not on anticoagulation prior to arrival. Patient is presenting with bilateral lower  extremity swelling and ShOB. CTA showed no evidence of PE, but noted to have  bilateral DVTs.  Pharmacy was consulted to dose heparin for VTE treatment.  Heparin level 0.31 on 1350 units/hr, down from AM level of 0.38.  Will empirically increase rate to target mid range of goal.  Goal of Therapy:  Heparin level 0.3-0.7 units/ml Monitor platelets by anticoagulation protocol: Yes   Plan:  Increase heparin to 1450 units/hr units/hr  Next heparin level with AM labs. Continue to monitor H&H and platelets   [employer], Pharm.D., BCPS Clinical Pharmacist  **Pharmacist phone directory can be found on [email or web address] listed under [facility].  12/20/2022 4:23 PM

## 2022-12-20 NOTE — Assessment & Plan Note (Signed)
Will replace, check magnesium and replace as needed

## 2022-12-20 NOTE — Assessment & Plan Note (Signed)
Hold farxiga  CBG q 2h

## 2022-12-20 NOTE — Assessment & Plan Note (Signed)
-   Pt diagnosed with CHF based on presence of the following: OA,  Pulmonary edema on CXR, and   bilateral leg edema, DOE,   With noted response to IV diuretic in ER  admit on telemetry,  cycle cardiac enzymes, Troponin    obtain serial ECG  to evaluate for ischemia as a cause of heart failure  monitor daily weight:  Filed Weights   12/19/22 1208  Weight: 88.5 kg   Last BNP BNP (last 3 results) Recent Labs    05/31/22 1519 11/07/22 2000 12/19/22 2333  BNP 157.3* 72.6 649.5*      diurese with IV lasix and monitor orthostatics and creatinine to avoid over diuresis.  Order echogram to evaluate EF and valves

## 2022-12-20 NOTE — Assessment & Plan Note (Signed)
Pt states doe snot use CPAP

## 2022-12-20 NOTE — Assessment & Plan Note (Signed)
Cont on heparin  CTA neg for PE

## 2022-12-21 DIAGNOSIS — E876 Hypokalemia: Secondary | ICD-10-CM | POA: Diagnosis not present

## 2022-12-21 DIAGNOSIS — I5031 Acute diastolic (congestive) heart failure: Secondary | ICD-10-CM | POA: Diagnosis not present

## 2022-12-21 DIAGNOSIS — J9601 Acute respiratory failure with hypoxia: Secondary | ICD-10-CM

## 2022-12-21 DIAGNOSIS — I824Y3 Acute embolism and thrombosis of unspecified deep veins of proximal lower extremity, bilateral: Secondary | ICD-10-CM | POA: Diagnosis not present

## 2022-12-21 LAB — BASIC METABOLIC PANEL
Anion gap: 11 (ref 5–15)
BUN: 10 mg/dL (ref 8–23)
CO2: 21 mmol/L — ABNORMAL LOW (ref 22–32)
Calcium: 8.2 mg/dL — ABNORMAL LOW (ref 8.9–10.3)
Chloride: 108 mmol/L (ref 98–111)
Creatinine, Ser: 1.34 mg/dL — ABNORMAL HIGH (ref 0.44–1.00)
GFR, Estimated: 42 mL/min — ABNORMAL LOW (ref >60)
Glucose, Bld: 112 mg/dL — ABNORMAL HIGH (ref 70–99)
Potassium: 3 mmol/L — ABNORMAL LOW (ref 3.5–5.1)
Sodium: 140 mmol/L (ref 135–145)

## 2022-12-21 LAB — CBC
HCT: 31.8 % — ABNORMAL LOW (ref 36.0–46.0)
Hemoglobin: 9.2 g/dL — ABNORMAL LOW (ref 12.0–15.0)
MCH: 26.3 pg (ref 26.0–34.0)
MCHC: 28.9 g/dL — ABNORMAL LOW (ref 30.0–36.0)
MCV: 90.9 fL (ref 80.0–100.0)
Platelets: 238 10*3/uL (ref 150–400)
RBC: 3.5 MIL/uL — ABNORMAL LOW (ref 3.87–5.11)
RDW: 18.7 % — ABNORMAL HIGH (ref 11.5–15.5)
WBC: 3.5 10*3/uL — ABNORMAL LOW (ref 4.0–10.5)
nRBC: 0 % (ref 0.0–0.2)

## 2022-12-21 LAB — T3: T3, Total: 102 ng/dL (ref 71–180)

## 2022-12-21 LAB — PHOSPHORUS: Phosphorus: 3.4 mg/dL (ref 2.5–4.6)

## 2022-12-21 MED ORDER — APIXABAN 5 MG PO TABS
10.0000 mg | ORAL_TABLET | Freq: Two times a day (BID) | ORAL | Status: DC
  Administered 2022-12-21 – 2022-12-25 (×9): 10 mg via ORAL
  Filled 2022-12-21 (×9): qty 2

## 2022-12-21 MED ORDER — POTASSIUM CHLORIDE CRYS ER 20 MEQ PO TBCR
40.0000 meq | EXTENDED_RELEASE_TABLET | ORAL | Status: AC
  Administered 2022-12-21 (×2): 40 meq via ORAL
  Filled 2022-12-21 (×2): qty 2

## 2022-12-21 MED ORDER — ZIPRASIDONE MESYLATE 20 MG IM SOLR
20.0000 mg | Freq: Once | INTRAMUSCULAR | Status: AC
  Administered 2022-12-21: 20 mg via INTRAMUSCULAR
  Filled 2022-12-21: qty 20

## 2022-12-21 MED ORDER — APIXABAN 5 MG PO TABS: 5.0000 mg | ORAL_TABLET | Freq: Two times a day (BID) | ORAL | Status: DC

## 2022-12-21 MED ORDER — IRON SUCROSE 500 MG IVPB - SIMPLE MED
500.0000 mg | Freq: Every day | INTRAVENOUS | Status: AC
  Administered 2022-12-21 – 2022-12-22 (×2): 500 mg via INTRAVENOUS
  Filled 2022-12-21 (×2): qty 275

## 2022-12-21 NOTE — Progress Notes (Addendum)
Physical Therapy Treatment Patient Details Name: AYLENE ACOFF MRN: [id number] DOB: Nov 17, 1950 Today's Date: 12/21/2022   History of Present Illness Patient is 72 y.o. female presented from PCP for LE edema and hypoxia. PMH significant for HTN, CHF, PVCs, CKD, HLD, OSA, hypothyroidism. CT negative for PE, age indeterminate DVT's noted in bil LE's.    PT Comments  Pt in just in bed on arrive, agreed to ambulation as has yet to be in the halls today.  No hands on assist needed to get to EOB.  Min guard for safety and stability while pt stood and pulled up undergarment.  Initial min assist, but then min guard with RW for 61feet in the hall.  Sats unable to maintain any higher than 85% on 6-8+L O2 [place].  EHR 120's bpm.     Assistance Recommended at Discharge Frequent or constant Supervision/Assistance  If plan is discharge home, recommend the following:  Can travel by private vehicle    A little help with walking and/or transfers;A little help with bathing/dressing/bathroom;Assistance with cooking/housework;Assist for transportation;Help with stairs or ramp for entrance      Equipment Recommendations  Rollator (4 wheels)    Recommendations for Other Services       Precautions / Restrictions Precautions Precautions: Fall Restrictions Weight Bearing Restrictions: No     Mobility  Bed Mobility Overal bed mobility: Needs Assistance Bed Mobility: Supine to Sit     Supine to sit: Min guard, HOB elevated     General bed mobility comments: minguard for safety    Transfers Overall transfer level: Needs assistance Equipment used: Rolling walker (2 wheels) Transfers: Sit to/from Stand Sit to Stand: Min guard           General transfer comment: pt used hands for boost and then manipulated and pulled up undergarment without assist.    Ambulation/Gait Ambulation/Gait assistance: Min guard Gait Distance (Feet): 75 Feet Assistive device: Rolling walker (2 wheels) Gait  Pattern/deviations: Step-through pattern, Decreased step length - right, Decreased step length - left, Decreased stride length, Trunk flexed Gait velocity: decr Gait velocity interpretation: <1.31 ft/sec, indicative of household ambulator   General Gait Details: cues for proximity to RW and posture.  Sats on 8+ L [place] dropping to low of 80%.  85% at best.  EHR into the low 120 bpm in compensation.\   Stairs             Wheelchair Mobility     Tilt Bed    Modified Rankin (Stroke Patients Only)       Balance Overall balance assessment: Needs assistance Sitting-balance support: Feet supported Sitting balance-Leahy Scale: Good     Standing balance support: During functional activity, No upper extremity supported, Single extremity supported Standing balance-Leahy Scale: Fair                              Cognition Arousal/Alertness: Awake/alert Behavior During Therapy: WFL for tasks assessed/performed Overall Cognitive Status: Within Functional Limits for tasks assessed                                          Exercises Other Exercises Other Exercises: coaching for efficient breathing    General Comments General comments (skin integrity, edema, etc.): 93% on 3L at rest.  6L unable oto maintain >85% 8+L as progressed to maintain 80-83%  HR in tnto the low 120's.      Pertinent Vitals/Pain      Home Living Family/patient expects to be discharged to:: Private residence Living Arrangements: Other relatives (granddaughter and her son) Available Help at Discharge: Available 24 hours/day;Other (Comment) (in and out at times, but dtr/grd-dtr available) Type of Home: Apartment Home Access: Level entry       Home Layout: One level Home Equipment: Agricultural consultant (2 wheels);Cane - quad;Shower seat;BSC/3in1;Grab bars - toilet;Grab bars - tub/shower      Prior Function            PT Goals (current goals can now be found in the care plan  section) Acute Rehab PT Goals PT Goal Formulation: With patient Time For Goal Achievement: 01/03/23 Potential to Achieve Goals: Good Progress towards PT goals: Progressing toward goals    Frequency    Min 3X/week      PT Plan Current plan remains appropriate    Co-evaluation              AM-PAC PT "6 Clicks" Mobility   Outcome Measure  Help needed turning from your back to your side while in a flat bed without using bedrails?: A Little Help needed moving from lying on your back to sitting on the side of a flat bed without using bedrails?: A Little Help needed moving to and from a bed to a chair (including a wheelchair)?: A Little Help needed standing up from a chair using your arms (e.g., wheelchair or bedside chair)?: A Little Help needed to walk in hospital room?: A Little Help needed climbing 3-5 steps with a railing? : A Lot 6 Click Score: 17    End of Session   Activity Tolerance: Patient tolerated treatment well Patient left: in bed;with call bell/phone within reach;with family/visitor present;Other (comment) (had just returned to bed.) Nurse Communication: Mobility status PT Visit Diagnosis: Difficulty in walking, not elsewhere classified (R26.2);Muscle weakness (generalized) (M62.81);Other abnormalities of gait and mobility (R26.89)     Time: 1540-1601 PT Time Calculation (min) (ACUTE ONLY): 21 min  Charges:    $Gait Training: 8-22 mins PT General Charges $$ ACUTE PT VISIT: 1 Visit                     12/21/2022  Jacinto Halim., PT Acute Rehabilitation Services [phone]  (office)   Eliseo Gum Aiyonna Lucado 12/21/2022, 4:16 PM

## 2022-12-21 NOTE — Evaluation (Signed)
Occupational Therapy Evaluation Patient Details Name: Michaela Baker MRN: [id number] DOB: Apr 11, 1951 Today's Date: 12/21/2022   History of Present Illness Patient is 72 y.o. female presented from PCP for LE edema and hypoxia. PMH significant for HTN, CHF, PVCs, CKD, HLD, OSA, hypothyroidism. CT negative for PE, age indeterminate DVT's noted in bil LE's.   Clinical Impression   Pt reports PTA, she was living at home with support from her daughter and granddaughter. Pt currently requires minguard-minA for toileting, LB dressing and functional mobility at RW level. Pt on 6lnc upon arrival, SpO2 89% at rest, with exertion on 6ln pt desat to 84% required 10lnc to rebound SpO2 >88% wiht exertion. Returned to 6lnc at rest and SpO2 92%. Will continue to follow acutely. Recommend d/c home with support from family and HHOT services.      Recommendations for follow up therapy are one component of a multi-disciplinary discharge planning process, led by the attending physician.  Recommendations may be updated based on patient status, additional functional criteria and insurance authorization.   Assistance Recommended at Discharge Intermittent Supervision/Assistance  Patient can return home with the following A little help with walking and/or transfers;A little help with bathing/dressing/bathroom    Functional Status Assessment  Patient has had a recent decline in their functional status and demonstrates the ability to make significant improvements in function in a reasonable and predictable amount of time.  Equipment Recommendations  BSC/3in1    Recommendations for Other Services       Precautions / Restrictions Precautions Precautions: Fall Restrictions Weight Bearing Restrictions: No      Mobility Bed Mobility Overal bed mobility: Needs Assistance Bed Mobility: Supine to Sit     Supine to sit: Min guard, HOB elevated     General bed mobility comments: minguard for safety     Transfers Overall transfer level: Needs assistance Equipment used: Rolling walker (2 wheels) Transfers: Sit to/from Stand Sit to Stand: Min guard, Min assist           General transfer comment: minA to boost into standing, cues for safe hand placement      Balance Overall balance assessment: Needs assistance Sitting-balance support: Feet supported Sitting balance-Leahy Scale: Good     Standing balance support: Single extremity supported, During functional activity Standing balance-Leahy Scale: Poor                             ADL either performed or assessed with clinical judgement   ADL Overall ADL's : Needs assistance/impaired Eating/Feeding: Set up;Sitting   Grooming: Min guard;Standing   Upper Body Bathing: Set up;Sitting   Lower Body Bathing: Min guard;Sit to/from stand   Upper Body Dressing : Set up;Sitting   Lower Body Dressing: Minimal assistance;Sit to/from stand Lower Body Dressing Details (indicate cue type and reason): assist for donning/doffing socks Toilet Transfer: Min guard;Ambulation;BSC/3in1;Rolling walker (2 wheels) Toilet Transfer Details (indicate cue type and reason): utilized BSC due to urgency, pt requiring minguard for safety and line management Toileting- Clothing Manipulation and Hygiene: Min guard;Sit to/from stand       Functional mobility during ADLs: Min guard;Rolling walker (2 wheels) General ADL Comments: minguard primarily during functional mobility with prn minA for sit<>stand from lower surfaces. Pt on 6lnc upon arrival, SpO2 89% at rest, with exertion on 6ln pt desat to 84% required 10lnc to rebound SpO2 >88% wiht exertion. Returned to 6lnc at rest and SpO2 92%     Vision  Perception     Praxis      Pertinent Vitals/Pain Pain Assessment Pain Assessment: No/denies pain     Hand Dominance Right   Extremity/Trunk Assessment Upper Extremity Assessment Upper Extremity Assessment: Overall WFL for  tasks assessed   Lower Extremity Assessment Lower Extremity Assessment: Generalized weakness   Cervical / Trunk Assessment Cervical / Trunk Assessment: Normal;Other exceptions Cervical / Trunk Exceptions: habitus   Communication Communication Communication: No difficulties   Cognition Arousal/Alertness: Awake/alert Behavior During Therapy: WFL for tasks assessed/performed Overall Cognitive Status: Within Functional Limits for tasks assessed                                       General Comments       Exercises Exercises: Other exercises Other Exercises Other Exercises: educated on pursed lip breathing to recover as pt desats during and following gait   Shoulder Instructions      Home Living Family/patient expects to be discharged to:: Private residence Living Arrangements: Other relatives (granddaughter and her son) Available Help at Discharge: Available 24 hours/day;Other (Comment) (in and out at times, but dtr/grd-dtr available) Type of Home: Apartment Home Access: Level entry     Home Layout: One level     Bathroom Shower/Tub: Chief Strategy Officer: Handicapped height Bathroom Accessibility: Yes   Home Equipment: Agricultural consultant (2 wheels);Cane - quad;Shower seat;BSC/3in1;Grab bars - toilet;Grab bars - tub/shower          Prior Functioning/Environment Prior Level of Function : Needs assist       Physical Assist : ADLs (physical)   ADLs (physical): Bathing;Dressing;IADLs Mobility Comments: ambulates with RW ADLs Comments: pt is able to get in/out of tub with assistance from her daughter        OT Problem List: Decreased activity tolerance;Impaired balance (sitting and/or standing);Decreased safety awareness;Cardiopulmonary status limiting activity      OT Treatment/Interventions: Self-care/ADL training;Therapeutic exercise;Energy conservation;Patient/family education;Balance training    OT Goals(Current goals can be found  in the care plan section) Acute Rehab OT Goals Patient Stated Goal: to go home OT Goal Formulation: With patient Time For Goal Achievement: 01/04/23 Potential to Achieve Goals: Good ADL Goals Pt Will Perform Grooming: with set-up;standing Pt Will Transfer to Toilet: with set-up;ambulating Additional ADL Goal #1: Pt will demonstrate independence with 3 energy conservation strategies during ADL and functional mobility.  OT Frequency: Min 2X/week    Co-evaluation              AM-PAC OT "6 Clicks" Daily Activity     Outcome Measure Help from another person eating meals?: A Little Help from another person taking care of personal grooming?: A Little Help from another person toileting, which includes using toliet, bedpan, or urinal?: A Little Help from another person bathing (including washing, rinsing, drying)?: A Little Help from another person to put on and taking off regular upper body clothing?: A Little Help from another person to put on and taking off regular lower body clothing?: A Little 6 Click Score: 18   End of Session Equipment Utilized During Treatment: Rolling walker (2 wheels);Gait belt;Oxygen Nurse Communication: Mobility status  Activity Tolerance: Patient tolerated treatment well Patient left: in chair;with call bell/phone within reach;with chair alarm set  OT Visit Diagnosis: Unsteadiness on feet (R26.81);Other abnormalities of gait and mobility (R26.89);Muscle weakness (generalized) (M62.81)  Time: 1610-9604 OT Time Calculation (min): 29 min Charges:  OT General Charges $OT Visit: 1 Visit OT Evaluation $OT Eval Low Complexity: 1 Low OT Treatments $Self Care/Home Management : 8-22 mins  Rosey Bath OTR/L Acute Rehabilitation Services Office: [phone]   Rebeca Alert 12/21/2022, 10:27 AM

## 2022-12-21 NOTE — Progress Notes (Signed)
New [place] placed on pt. Pt had previously ripped apart the H[place] tubing, and was struggling to rip apart the IV tubing. After recovering to adequate o2 levels, pt much more oriented and able to distinguish that she is in the hospital and needs medications for her blood clots.   Pt swears that she has been asleep all night. Previously, after removing [place], pt had torn off gown, thrown blankets, took out purewick and thrown it across the room, and pulled all monitor cords.

## 2022-12-21 NOTE — Progress Notes (Addendum)
Pt declines care and lab draws   0605: Pt states that she knows that she is in the hospital and is getting heparin for blood clots. However, she is confronting "How do you know where I live" and repeatedly telling staff "Get out of my house"  Pt continues to attempt to remove nasal cannula, o2 sensor, and lines

## 2022-12-21 NOTE — Plan of Care (Signed)
  Problem: Coping: Goal: Ability to adjust to condition or change in health will improve Outcome: Progressing   Problem: Fluid Volume: Goal: Ability to maintain a balanced intake and output will improve Outcome: Progressing   Problem: Health Behavior/Discharge Planning: Goal: Ability to identify and utilize available resources and services will improve Outcome: Progressing Goal: Ability to manage health-related needs will improve Outcome: Progressing   Problem: Education: Goal: Ability to demonstrate management of disease process will improve Outcome: Progressing Goal: Ability to verbalize understanding of medication therapies will improve Outcome: Progressing   Problem: Activity: Goal: Capacity to carry out activities will improve Outcome: Progressing   Problem: Safety: Goal: Non-violent Restraint(s) Outcome: Progressing

## 2022-12-21 NOTE — Discharge Instructions (Addendum)
Information on my medicine - ELIQUIS (apixaban)  This medication education was reviewed with me or my healthcare representative as part of my discharge preparation.    Why was Eliquis prescribed for you? Eliquis was prescribed for you to reduce the risk of a blood clot forming that can cause a stroke if you have a medical condition called atrial fibrillation (a type of irregular heartbeat).  What do You need to know about Eliquis ? Take your Eliquis TWICE DAILY - one tablet in the morning and one tablet in the evening with or without food. If you have difficulty swallowing the tablet whole please discuss with your pharmacist how to take the medication safely.  Take Eliquis exactly as prescribed by your doctor and DO NOT stop taking Eliquis without talking to the doctor who prescribed the medication.  Stopping may increase your risk of developing a stroke.  Refill your prescription before you run out.  After discharge, you should have regular check-up appointments with your healthcare provider that is prescribing your Eliquis.  In the future your dose may need to be changed if your kidney function or weight changes by a significant amount or as you get older.  What do you do if you miss a dose? If you miss a dose, take it as soon as you remember on the same day and resume taking twice daily.  Do not take more than one dose of ELIQUIS at the same time to make up a missed dose.  Important Safety Information A possible side effect of Eliquis is bleeding. You should call your healthcare provider right away if you experience any of the following: Bleeding from an injury or your nose that does not stop. Unusual colored urine (red or dark brown) or unusual colored stools (red or black). Unusual bruising for unknown reasons. A serious fall or if you hit your head (even if there is no bleeding).  Some medicines may interact with Eliquis and might increase your risk of bleeding or clotting  while on Eliquis. To help avoid this, consult your healthcare provider or pharmacist prior to using any new prescription or non-prescription medications, including herbals, vitamins, non-steroidal anti-inflammatory drugs (NSAIDs) and supplements.  This website has more information on Eliquis (apixaban): http://www.eliquis.com/eliquis/home  ======================================================  Deep Vein Thrombosis    Deep vein thrombosis (DVT) is a condition in which a blood clot forms in a deep vein, such as a lower leg, thigh, or arm vein. A clot is blood that has thickened into a gel or solid. This condition is dangerous. It can lead to serious and even life-threatening complications if the clot travels to the lungs and causes a blockage (pulmonary embolism). It can also damage veins in the leg. This can result in leg pain, swelling, discoloration, and sores (post-thrombotic syndrome).  What are the causes? This condition may be caused by: A slowdown of blood flow. Damage to a vein. A condition that causes blood to clot more easily, such as an inherited clotting disorder.  What increases the risk? The following factors may make you more likely to develop this condition: Being overweight. Being older, especially over age 28. Sitting or lying down for more than four hours. Being in the hospital. Lack of physical activity (sedentary lifestyle). Pregnancy, being in childbirth, or having recently given birth. Taking medicines that contain estrogen, such as medicines to prevent pregnancy. Smoking. A history of any of the following: Blood clots or a blood clotting disease. Peripheral vascular disease. Inflammatory bowel disease. Cancer. Heart  disease. Genetic conditions that affect how your blood clots, such as Factor V Leiden mutation. Neurological diseases that affect your legs (leg paresis). A recent injury, such as a car accident. Major or lengthy surgery. A central line placed  inside a large vein.  What are the signs or symptoms? Symptoms of this condition include: Swelling, pain, or tenderness in an arm or leg. Warmth, redness, or discoloration in an arm or leg. If the clot is in your leg, symptoms may be more noticeable or worse when you stand or walk. Some people may not develop any symptoms.  How is this diagnosed? This condition is diagnosed with: A medical history and physical exam. Tests, such as: Blood tests. These are done to check how well your blood clots. Ultrasound. This is done to check for clots. Venogram. For this test, contrast dye is injected into a vein and X-rays are taken to check for any clots  How is this treated? Treatment for this condition depends on: The cause of your DVT. Your risk for bleeding or developing more clots. Any other medical conditions that you have. Treatment may include: Taking a blood thinner (anticoagulant). This type of medicine prevents clots from forming. It may be taken by mouth, injected under the skin, or injected through an IV (catheter). Injecting clot-dissolving medicines into the affected vein (catheter-directed thrombolysis). Having surgery. Surgery may be done to: Remove the clot. Place a filter in a large vein to catch blood clots before they reach the lungs. Some treatments may be continued for up to six months.  Follow these instructions at home: If you are taking blood thinners: Take the medicine exactly as told by your health care provider. Some blood thinners need to be taken at the same time every day. Do not skip a dose. Talk with your health care provider before you take any medicines that contain aspirin or NSAIDs. These medicines increase your risk for dangerous bleeding. Ask your health care provider about foods and drugs that could change the way the medicine works (may interact). Avoid those things if your health care provider tells you to do so. Blood thinners can cause easy bruising  and may make it difficult to stop bleeding. Because of this: Be very careful when using knives, scissors, or other sharp objects. Use an electric razor instead of a blade. Avoid activities that could cause injury or bruising, and follow instructions about how to prevent falls. Wear a medical alert bracelet or carry a card that lists what medicines you take.  General instructions Take over-the-counter and prescription medicines only as told by your health care provider. Return to your normal activities as told by your health care provider. Ask your health care provider what activities are safe for you. Wear compression stockings if recommended by your health care provider. Keep all follow-up visits as told by your health care provider. This is important.  How is this prevented? To lower your risk of developing this condition again: For 30 or more minutes every day, do an activity that: Involves moving your arms and legs. Increases your heart rate. When traveling for longer than four hours: Exercise your arms and legs every hour. Drink plenty of water. Avoid drinking alcohol. Avoid sitting or lying for a long time without moving your legs. If you have surgery or you are hospitalized, ask about ways to prevent blood clots. These may include taking frequent walks or using anticoagulants. Stay at a healthy weight. If you are a woman who is older  than age 19, avoid unnecessary use of medicines that contain estrogen, such as some birth control pills. Do not use any products that contain nicotine or tobacco, such as cigarettes and e-cigarettes. This is especially important if you take estrogen medicines. If you need help quitting, ask your health care provider.  Contact a health care provider if: You miss a dose of your blood thinner. Your menstrual period is heavier than usual. You have unusual bruising.  Get help right away if: You have: New or increased pain, swelling, or redness in an  arm or leg. Numbness or tingling in an arm or leg. Shortness of breath. Chest pain. A rapid or irregular heartbeat. A severe headache or confusion. A cut that will not stop bleeding. There is blood in your vomit, stool, or urine. You have a serious fall or accident, or you hit your head. You feel light-headed or dizzy. You cough up blood.  These symptoms may represent a serious problem that is an emergency. Do not wait to see if the symptoms will go away. Get medical help right away. Call your local emergency services (911 in the [place] Do not drive yourself to the hospital. Summary Deep vein thrombosis (DVT) is a condition in which a blood clot forms in a deep vein, such as a lower leg, thigh, or arm vein. Symptoms can include swelling, warmth, pain, and redness in your leg or arm. This condition may be treated with a blood thinner (anticoagulant medicine), medicine that is injected to dissolve blood clots,compression stockings, or surgery. If you are prescribed blood thinners, take them exactly as told. This information is not intended to replace advice given to you by your health care provider. Make sure you discuss any questions you have with your health care provider. Document Revised: 04/28/2017 Document Reviewed: 10/14/2016 Elsevier Patient Education  [place].

## 2022-12-21 NOTE — Progress Notes (Signed)
ANTICOAGULATION CONSULT NOTE - Follow Up  Pharmacy Consult for Heparin >> apixaban Indication:  suspected PE >>bilateral DVTs  No Known Allergies  Patient Measurements: Height: 5\' 5"  (165.1 cm) Weight: 86.1 kg (189 lb 13.1 oz) IBW/kg (Calculated) : 57 Heparin Dosing Weight: 76.4 kg  Vital Signs: BP: 129/64 (07/24 0725) Pulse Rate: 99 (07/24 0553)  Labs: Recent Labs    12/19/22 1258 12/19/22 1545 12/19/22 2333 12/20/22 0444 12/20/22 0900 12/20/22 1456  HGB 9.0*  --   --   --   --  8.9*  HCT 30.5*  --   --   --   --  30.9*  PLT 228  --   --   --   --  241  HEPARINUNFRC  --   --  0.23* 0.38  --  0.31  CREATININE 1.37*  --  1.18*  --  1.13* 1.38*  TROPONINIHS 24* 24*  --   --   --   --     Estimated Creatinine Clearance: 39.9 mL/min (A) (by C-G formula based on SCr of 1.38 mg/dL (H)).   Medical History: Past Medical History:  Diagnosis Date   CHF (congestive heart failure) (HCC)    Hypertension    Hypothyroidism 03/29/2017   Obesity (BMI 30-39.9)    Premature ventricular contractions     Medications:  Medications Prior to Admission  Medication Sig Dispense Refill Last Dose   acetaminophen (TYLENOL) 500 MG tablet Take 1,000 mg by mouth as needed (pain).   12/19/2022   amLODipine (NORVASC) 10 MG tablet Take 10 mg by mouth daily.   12/19/2022   aspirin EC 81 MG EC tablet Take 1 tablet (81 mg total) by mouth daily. 30 tablet 1 12/19/2022   dapagliflozin propanediol (FARXIGA) 10 MG TABS tablet Take 10 mg by mouth daily.   12/19/2022   folic acid (FOLVITE) 1 MG tablet Take 1 tablet (1 mg total) by mouth daily. 30 tablet 0 12/19/2022   furosemide (LASIX) 40 MG tablet Take 40 mg by mouth 2 (two) times daily.   Past Week   hydrALAZINE (APRESOLINE) 50 MG tablet Take 50 mg by mouth daily.   12/19/2022   KERENDIA 10 MG TABS take 1 tablet by mouth daily for chronic kidney disease   12/19/2022   levothyroxine (SYNTHROID, LEVOTHROID) 25 MCG tablet Take 12.5 mcg by mouth daily.  5  12/19/2022   metoprolol succinate (TOPROL-XL) 50 MG 24 hr tablet Take 50 mg by mouth daily. Take with or immediately following a meal.   12/19/2022   Multiple Vitamins-Minerals (MULTIVITAMIN WITH MINERALS) tablet Take 2 tablets by mouth daily.   Past Week   rosuvastatin (CRESTOR) 5 MG tablet Take 5 mg by mouth daily.   12/19/2022   Scheduled:   furosemide  40 mg Intravenous Daily   metoprolol succinate  50 mg Oral Daily   potassium chloride  40 mEq Oral Once   rosuvastatin  5 mg Oral Daily   sodium chloride flush  3 mL Intravenous Q12H   Infusions:   sodium chloride     heparin 1,450 Units/hr (12/21/22 0714)   PRN: sodium chloride, acetaminophen **OR** acetaminophen, HYDROcodone-acetaminophen, ondansetron **OR** ondansetron (ZOFRAN) IV, sodium chloride flush  Assessment: 71 yof with a history of HF, hx of endovenous laser ablation of rt saphenous vein. Patient is not on anticoagulation prior to arrival. Patient is presenting with bilateral lower extremity swelling and SOB. CTA showed no evidence of PE, but has bilateral DVTs.Heparin per pharmacy consult placed for VTE  treatment.  Patient refused heparin level draw this morning. Spoke with provider and will transition to apixaban at this time due to safety concerns with heparin monitoring. No DDIs identified.  Goal of Therapy:  Monitor platelets by anticoagulation protocol: Yes   Plan:  Stop heparin infusion Start apixaban 10 mg BID x7 days then 5 mg BID thereafter Monitor for signs/symptoms of bleeding  Romie Minus, PharmD PGY1 Pharmacy Resident  Please check AMION for all [facility] Pharm[facility] phone numbers After 10:00 PM, call Main Pharmacy [phone]
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
## 2022-12-21 NOTE — Progress Notes (Signed)
Pt coughed up scant dark red sputum, MD notified. Will cont to monitor.

## 2022-12-21 NOTE — Progress Notes (Signed)
Initial Nutrition Assessment  DOCUMENTATION CODES:   Not applicable  INTERVENTION:  Continue to encourage optimal nutritional intake Snacks TID between meals  MVI with minerals daily  NUTRITION DIAGNOSIS:   Increased nutrient needs related to acute illness as evidenced by estimated needs.  GOAL:   Patient will meet greater than or equal to 90% of their needs  MONITOR:   PO intake, Supplement acceptance, Weight trends, Labs, I & O's  REASON FOR ASSESSMENT:   Consult Assessment of nutrition requirement/status  ASSESSMENT:   Pt admitted with SOB d/t acute diastolic CHF. PMH significant for CHF, hypothyroidism, HLD, folic acid deficiency, CKD, HTN, OSA.  Pt sitting up in bedside chair, with daughter present at bedside.   Pt was discharged with a 30 day supply of megace after last admission. Since then her appetite has been wonderful. She is no longer taking megace but continues with a great appetite. Pt does not follow any specific diet however tries to minimize sodium intake.   Prior to the last month, pt had a very poor appetite and was eating very minimally for about 2 months. She had tried nutrition supplements during that time but did not enjoy them.   Meal completions: 7/23: 95% dinner  Pt reports weighing about 300 lbs throughout her life. About 3 months ago her weight declined to about 131 lbs. Her daughter suspects this is related to poor appetite/PO intake in addition to some fluid loss.   Reviewed weight history on file. Last admission (1 month ago), pt's weight varied during admission from 68.1-72.2 kg. Current weight is increased from last admission. It is likely this is d/t increased PO intake, in addition to BLE edema. 1 year ago, pt's weight was documented to be 98.4 kg. Suspect pt has had true dry weight loss over the last year per report from pt and her daughter, however it is difficult to quantify d/t presence of edema and unknown EDW.   Edema: moderate  pitting RLE, mild pitting LLE  Medications: lasix 40mg  daily, IV heparin  Labs (7/23): potassium 3.2, Cr 1.38, Phos 2.4, GFR 41  UOP: 1.1L x12 hours I/O's: - since admit  NUTRITION - FOCUSED PHYSICAL EXAM:  Flowsheet Row Most Recent Value  Orbital Region No depletion  Upper Arm Region No depletion  Thoracic and Lumbar Region No depletion  Buccal Region No depletion  Temple Region Mild depletion  Clavicle Bone Region Moderate depletion  Clavicle and Acromion Bone Region Moderate depletion  Scapular Bone Region Moderate depletion  Dorsal Hand Moderate depletion  Patellar Region No depletion  Anterior Thigh Region No depletion  Posterior Calf Region No depletion  Edema (RD Assessment) Moderate  [RLE>LLE]  Hair Reviewed  Eyes Reviewed  Mouth Other (Comment)  [edentulous]  Skin Reviewed  Nails Reviewed       Diet Order:   Diet Order             Diet Heart Room service appropriate? Yes; Fluid consistency: Thin  Diet effective now                   EDUCATION NEEDS:   Education needs have been addressed  Skin:  Skin Assessment: Reviewed RN Assessment  Last BM:  7/23  Height:   Ht Readings from Last 1 Encounters:  12/20/22 5\' 5"  (1.651 m)    Weight:   Wt Readings from Last 1 Encounters:  12/21/22 86.1 kg    Ideal Body Weight:  56.8 kg  BMI:  Body mass index  is 31.59 kg/m.  Estimated Nutritional Needs:   Kcal:  1500-1700  Protein:  75-90g  Fluid:  >/=1.5L  Drusilla Kanner, RDN, LDN Clinical Nutrition

## 2022-12-21 NOTE — Progress Notes (Addendum)
PROGRESS NOTE    AIJALON DEMURO  VWU:[id number] DOB: 10-04-1950 DOA: 12/19/2022 PCP: Diamantina Providence, FNP    Brief Narrative:  72 y.o. female with past medical history significant of CHF, hypothyroidism, folic acid deficiency, hyperlipidemia, CKD,  history of aspiration pneumonia, HTN, OSA presented to hospital with a 1 day history of lower extremity edema and shortness of breath.  Patient had initially presented to the office where she was noted to be hypoxic with pulse ox of 77% on room air.  In the ED, chest x-ray showed tiny effusions.  CTA of the chest that did not show any evidence of pulmonary embolism, 2.4 cm right thyroid nodule, small bilateral pleural effusions, interstitial densities..  Patient denied any chest pain.  She used to be on oxygen in the past but had not been using it.     Assessment and plan.    Acute hypoxic respiratory failure secondary to acute on chronic  diastolic CHF  Patient had a pulse ox of 88%  on room air on presentation and had  dyspnea and shortness of breath.  Currently, on 6 L of oxygen by nasal cannula.  Will continue to wean as able.  BNP of 549 on presentation., patient had minimally elevated troponins.  On IV Lasix 40 milligrams daily.  Patient is noncompliant with medications as outpatient.  Continue strict intake and output charting, daily weights.   Pulse ox.  Continue beta-blocker.   2D echocardiogram on 12/20/2022 showed LV ejection fraction of 60 to 65% with no regional wall motion abnormality but grade 1 diastolic dysfunction. On farxiga at home on hold now.  Continue beta-blocker.  Patient is negative balance for 373 ml but had good urinary output at 1100 ml. PT OT recommend home health on discharge.   Hypokalemia:  Potassium today after replacement was 3.0.  Will give her 40 mg of potassium x 2.  Check levels in AM.  Hypomagnesemia.  Was replenished.  Latest magnesium of 1.9.  Mild hypophosphatemia.  Received IV potassium phosphate  yesterday.  Repeat Phos  level today at 3.4.  Check phosphate level in AM.  Acute bilateral DVT:  Venous duplex showed bilateral DVT.  Has been started on heparin drip.  Will transition to Eliquis at this time.  CTA of the chest without PE.  Denies overt pain in the legs.   Iron deficiency anemia:  Hemoglobin of 8.9.  Will consider IV iron infusion while in the hospital.  Elevated troponins probably from demand ischemia from CHF.  Will closely monitor.  Continue diuretics and beta-blockers.  Obesity.  Body mass index is 31.99 kg/m.Patient would benefit from weight loss as outpatient.    Hypothyroidism; TSH less than 0.01, free T41.1 and elevated, total T3 within normal range.  Hold off with Synthroid for now.  Patient takes 12.5 mcg at home.    Hyperlipidemia;  Continue Crestor.  Hypertension. Latest blood pressure of 129/64.Patient is on amlodipine, hydralazine and metoprolol at home.  Will continue to hold.      DVT prophylaxis:  apixaban (ELIQUIS) tablet 10 mg  apixaban (ELIQUIS) tablet 5 mg   Code Status:     Code Status: Full Code  Disposition: Home with home health in 1 to 2 days, pending clinical improvement..  Status is: Inpatient  Remains inpatient appropriate because: Status post decompensated heart failure, electrolyte imbalance, pending clinical improvement, IV diuretics   Family Communication:  spoke with the patient's daughter at bedside.  Consultants:  None  Procedures:  None  Antimicrobials:  None  Anti-infectives (From admission, onward)    None      Subjective: Today, patient was seen and examined at bedside.  Patient states that she feels a little better today with breathing but still has some shortness of breath especially on exertion.  Objective: Vitals:   12/21/22 0607 12/21/22 0725 12/21/22 1000 12/21/22 1156  BP:  129/64  118/68  Pulse:    73  Resp: 19 20  (!) 23  Temp:   99.6 F (37.6 C) 98 F (36.7 C)  TempSrc:   Oral Oral   SpO2: 96%   100%  Weight:      Height:        Intake/Output Summary (Last 24 hours) at 12/21/2022 1404 Last data filed at 12/21/2022 0700 Gross per 24 hour  Intake 1341.05 ml  Output 1100 ml  Net 241.05 ml   Filed Weights   12/19/22 1208 12/20/22 1402 12/21/22 0500  Weight: 88.5 kg 87.2 kg 86.1 kg    Physical Examination: Body mass index is 31.59 kg/m.  General: Obese built, not in obvious distress, on 6 L of oxygen by nasal cannula. HENT:   No scleral pallor or icterus noted. Oral mucosa is moist.  Chest:   Diminished breath sounds bilaterally.  Crackles noted over the bases. CVS: S1 &S2 heard. No murmur.  Regular rate and rhythm. Abdomen: Soft, nontender, nondistended.  Bowel sounds are heard.   Extremities: No cyanosis, clubbing trace bilateral lower extremity edema.  Peripheral pulses are palpable. Psych: Alert, awake and oriented, normal mood CNS:  No cranial nerve deficits.  Power equal in all extremities.   Skin: Warm and dry.  No rashes noted.  Data Reviewed:   CBC: Recent Labs  Lab 12/19/22 1258 12/20/22 1456 12/21/22 0956  WBC 4.4 5.4 3.5*  NEUTROABS 2.8  --   --   HGB 9.0* 8.9* 9.2*  HCT 30.5* 30.9* 31.8*  MCV 91.9 88.3 90.9  PLT 228 241 238    Basic Metabolic Panel: Recent Labs  Lab 12/19/22 1258 12/19/22 2333 12/20/22 0900 12/20/22 1456 12/21/22 0956  NA 138 139 140 141 140  K 2.8* 2.5* 3.2* 3.2* 3.0*  CL 109 108 111 109 108  CO2 20* 22 21* 24 21*  GLUCOSE 79 99 81 93 112*  BUN 12 11 11 9 10   CREATININE 1.37* 1.18* 1.13* 1.38* 1.34*  CALCIUM 8.0* 8.0* 7.8* 8.1* 8.2*  MG  --  1.6* 2.0 1.9  --   PHOS  --  2.5  --  2.4* 3.4    Liver Function Tests: Recent Labs  Lab 12/19/22 1258 12/20/22 1456  AST 26 19  ALT 11 10  ALKPHOS 45 46  BILITOT 0.5 0.5  PROT 7.0 7.2  ALBUMIN 2.2* 2.1*     Radiology Studies: ECHOCARDIOGRAM COMPLETE  Result Date: 12/20/2022    ECHOCARDIOGRAM REPORT   Patient Name:   ADAMARIE IZZO [facility] Date of Exam:  12/20/2022 Medical Rec #:  [id number]     Height:       65.0 in Accession #:    [id number]    Weight:       195.0 lb Date of Birth:  1950/08/02     BSA:          1.957 m Patient Age:    71 years      BP:           124/62 mmHg Patient Gender: F  HR:           72 bpm. Exam Location:  Inpatient Procedure: 2D Echo, Color Doppler and Cardiac Doppler Indications:    I50.21 Acute systolic (congestive) heart failure  History:        Patient has prior history of Echocardiogram examinations, most                 recent 06/02/2022. CHF; Risk Factors:Hypertension, Diabetes,                 Dyslipidemia and Sleep Apnea.  Sonographer:    Irving Burton Senior RDCS Referring Phys: Consepcion Hearing ANASTASSIA DOUTOVA IMPRESSIONS  1. Left ventricular ejection fraction, by estimation, is 60 to 65%. Left ventricular ejection fraction by 2D MOD biplane is 60.6 %. The left ventricle has normal function. The left ventricle has no regional wall motion abnormalities. Left ventricular diastolic parameters are consistent with Grade I diastolic dysfunction (impaired relaxation).  2. Right ventricular systolic function is normal. The right ventricular size is mildly enlarged. There is severely elevated pulmonary artery systolic pressure. The estimated right ventricular systolic pressure is 61.0 mmHg.  3. The mitral valve is degenerative. Mild mitral valve regurgitation. No evidence of mitral stenosis.  4. Tricuspid valve regurgitation is mild to moderate.  5. The aortic valve is tricuspid. Aortic valve regurgitation is trivial. No aortic stenosis is present.  6. The inferior vena cava is dilated in size with <50% respiratory variability, suggesting right atrial pressure of 15 mmHg. Comparison(s): Prior images reviewed side by side. There is increase in estimated pulmonary artery pressure and mean right atrial pressure, but there appears to be no change in mean left atrial pressure. FINDINGS  Left Ventricle: Left ventricular ejection fraction, by estimation,  is 60 to 65%. Left ventricular ejection fraction by 2D MOD biplane is 60.6 %. The left ventricle has normal function. The left ventricle has no regional wall motion abnormalities. The left ventricular internal cavity size was normal in size. There is borderline concentric left ventricular hypertrophy. Left ventricular diastolic parameters are consistent with Grade I diastolic dysfunction (impaired relaxation). Normal left ventricular filling pressure. Right Ventricle: The right ventricular size is mildly enlarged. No increase in right ventricular wall thickness. Right ventricular systolic function is normal. There is severely elevated pulmonary artery systolic pressure. The tricuspid regurgitant velocity is 3.39 m/s, and with an assumed right atrial pressure of 15 mmHg, the estimated right ventricular systolic pressure is 61.0 mmHg. Left Atrium: Left atrial size was normal in size. Right Atrium: Right atrial size was normal in size. Pericardium: There is no evidence of pericardial effusion. Mitral Valve: The mitral valve is degenerative in appearance. There is mild thickening of the mitral valve leaflet(s). Mild mitral valve regurgitation. No evidence of mitral valve stenosis. Tricuspid Valve: The tricuspid valve is normal in structure. Tricuspid valve regurgitation is mild to moderate. No evidence of tricuspid stenosis. Aortic Valve: The aortic valve is tricuspid. Aortic valve regurgitation is trivial. No aortic stenosis is present. Pulmonic Valve: The pulmonic valve was normal in structure. Pulmonic valve regurgitation is trivial. No evidence of pulmonic stenosis. Aorta: The aortic root and ascending aorta are structurally normal, with no evidence of dilitation. Venous: The inferior vena cava is dilated in size with less than 50% respiratory variability, suggesting right atrial pressure of 15 mmHg. IAS/Shunts: No atrial level shunt detected by color flow Doppler.  LEFT VENTRICLE PLAX 2D  Biplane EF (MOD) LVIDd:         4.70 cm         LV Biplane EF:   Left LVIDs:         3.55 cm                          ventricular LV PW:         1.00 cm                          ejection LV IVS:        1.00 cm                          fraction by LVOT diam:     2.20 cm                          2D MOD LV SV:         92                               biplane is LV SV Index:   47                               60.6 %. LVOT Area:     3.80 cm                                Diastology                                LV e' medial:    6.85 cm/s LV Volumes (MOD)               LV E/e' medial:  9.5 LV vol d, MOD    87.2 ml       LV e' lateral:   7.83 cm/s A2C:                           LV E/e' lateral: 8.3 LV vol d, MOD    92.3 ml A4C: LV vol s, MOD    33.3 ml A2C: LV vol s, MOD    36.3 ml A4C: LV SV MOD A2C:   53.9 ml LV SV MOD A4C:   92.3 ml LV SV MOD BP:    54.6 ml RIGHT VENTRICLE RV S prime:     13.10 cm/s TAPSE (M-mode): 2.1 cm LEFT ATRIUM             Index        RIGHT ATRIUM           Index LA diam:        3.70 cm 1.89 cm/m   RA Area:     13.60 cm LA Vol (A2C):   72.9 ml 37.25 ml/m  RA Volume:   27.50 ml  14.05 ml/m LA Vol (A4C):   57.4 ml 29.33 ml/m LA Biplane Vol: 65.3 ml 33.37 ml/m  AORTIC VALVE LVOT Vmax:   112.00 cm/s LVOT Vmean:  80.800 cm/s LVOT VTI:    0.243 m  AORTA Ao Root diam:  3.10 cm Ao Asc diam:  3.60 cm MITRAL VALVE               TRICUSPID VALVE MV Area (PHT): 2.65 cm    TR Peak grad:   46.0 mmHg MV E velocity: 65.10 cm/s  TR Vmax:        339.00 cm/s MV A velocity: 82.70 cm/s MV E/A ratio:  0.79        SHUNTS                            Systemic VTI:  0.24 m                            Systemic Diam: 2.20 cm Rachelle Hora Croitoru MD Electronically signed by Thurmon Fair MD Signature Date/Time: 12/20/2022/10:57:48 AM    Final    VAS [place] LOWER EXTREMITY VENOUS (DVT)  Result Date: 12/19/2022  Lower Venous DVT Study Patient Name:  AYLEE LITTRELL [facility]  Date of Exam:   12/19/2022 Medical Rec #: [id number]       Accession #:    [id number] Date of Birth: [birth year]      Patient Gender: F Patient Age:   12 years Exam Location:  [facility] Procedure:      VAS [place] LOWER EXTREMITY VENOUS (DVT) Referring Phys: Nehemiah Settle SMALL --------------------------------------------------------------------------------  Indications: Swelling, and SOB.  Risk Factors: History of right LE reflux disease. S/P right GSV ablation 01/26/22. Limitations: Body habitus and edema. Comparison Study: Prior negative right LEV Performing Technologist: Sherren Kerns RVS  Examination Guidelines: A complete evaluation includes B-mode imaging, spectral Doppler, color Doppler, and power Doppler as needed of all accessible portions of each vessel. Bilateral testing is considered an integral part of a complete examination. Limited examinations for reoccurring indications may be performed as noted. The reflux portion of the exam is performed with the patient in reverse Trendelenburg.  +---------+---------------+---------+-----------+--------------+---------------+ RIGHT    CompressibilityPhasicitySpontaneityProperties    Thrombus Aging  +---------+---------------+---------+-----------+--------------+---------------+ CFV      Partial                            pulsatile flowAge                                                                       Indeterminate   +---------+---------------+---------+-----------+--------------+---------------+ FV Prox  Full                                                             +---------+---------------+---------+-----------+--------------+---------------+ FV Mid   Full                                                             +---------+---------------+---------+-----------+--------------+---------------+ FV Distal  pulsatile flowpatent by color                                                           and Doppler      +---------+---------------+---------+-----------+--------------+---------------+ PFV                                                       Not well                                                                  visualized      +---------+---------------+---------+-----------+--------------+---------------+ POP      Partial        No       No                       Age                                                                       Indeterminate   +---------+---------------+---------+-----------+--------------+---------------+ PTV                                                       Not well                                                                  visualized      +---------+---------------+---------+-----------+--------------+---------------+ PERO                                                      Not well                                                                  visualized      +---------+---------------+---------+-----------+--------------+---------------+   +----------+---------------+---------+-----------+----------+-----------------+ LEFT      CompressibilityPhasicitySpontaneityPropertiesThrombus Aging    +----------+---------------+---------+-----------+----------+-----------------+ CFV       None  No       No                   Age Indeterminate +----------+---------------+---------+-----------+----------+-----------------+ SFJ       Full                                                           +----------+---------------+---------+-----------+----------+-----------------+ FV Prox   None                                         Age Indeterminate +----------+---------------+---------+-----------+----------+-----------------+ FV Mid    None                                         Age Indeterminate +----------+---------------+---------+-----------+----------+-----------------+ FV Distal  None                                         Age Indeterminate +----------+---------------+---------+-----------+----------+-----------------+ PFV       Full                                                           +----------+---------------+---------+-----------+----------+-----------------+ POP       None           No       No                   Age Indeterminate +----------+---------------+---------+-----------+----------+-----------------+ PTV       None                                         Acute             +----------+---------------+---------+-----------+----------+-----------------+ PERO                                                   Not well                                                                 visualized        +----------+---------------+---------+-----------+----------+-----------------+ EIV                                                    patent            +----------+---------------+---------+-----------+----------+-----------------+  Distal CIV                                             patent            +----------+---------------+---------+-----------+----------+-----------------+     Summary: RIGHT: - Findings consistent with age indeterminate deep vein thrombosis involving the right common femoral vein, and right popliteal vein. - No cystic structure found in the popliteal fossa.  LEFT: - Findings consistent with age indeterminate deep vein thrombosis involving the left common femoral vein, left femoral vein, left popliteal vein, and left posterior tibial veins. - No cystic structure found in the popliteal fossa.  *See table(s) above for measurements and observations. Electronically signed by Lemar Livings MD on 12/19/2022 at 7:04:33 PM.    Final    CT Angio Chest PE W and/or Wo Contrast  Result Date: 12/19/2022 CLINICAL DATA:  Positive D-dimer. EXAM: CT ANGIOGRAPHY CHEST WITH CONTRAST TECHNIQUE: Multidetector CT imaging  of the chest was performed using the standard protocol during bolus administration of intravenous contrast. Multiplanar CT image reconstructions and MIPs were obtained to evaluate the vascular anatomy. RADIATION DOSE REDUCTION: This exam was performed according to the departmental dose-optimization program which includes automated exposure control, adjustment of the mA and/or kV according to patient size and/or use of iterative reconstruction technique. CONTRAST:  75mL OMNIPAQUE IOHEXOL 350 MG/ML SOLN COMPARISON:  November 09, 2022. FINDINGS: Cardiovascular: Satisfactory opacification of the pulmonary arteries to the segmental level. No evidence of pulmonary embolism. Mild cardiomegaly. No pericardial effusion. Coronary artery calcifications are noted. Mediastinum/Nodes: 2.4 cm right thyroid nodule is noted. No adenopathy is noted. The esophagus is unremarkable. Lungs/Pleura: Small bilateral pleural effusions are noted with adjacent subsegmental atelectasis. Interstitial densities are noted throughout both lungs concerning for edema or possibly atypical pneumonia. Cavitary lesion noted in right upper lobe on prior exam is significantly smaller currently, measuring 3.5 x 2.5 cm. Upper Abdomen: No acute abnormality. Musculoskeletal: No chest wall abnormality. No acute or significant osseous findings. Review of the MIP images confirms the above findings. IMPRESSION: No definite evidence of pulmonary embolus. 2.4 cm right thyroid nodule is noted. Recommend thyroid US. (Ref: J Am Coll Radiol. 2015 Feb;12(2): 143-50). Small bilateral pleural effusions are noted with adjacent subsegmental atelectasis. Interstitial densities are noted throughout both lungs concerning for edema or possibly atypical inflammation. Cavitary lesion seen in right upper lobe on prior exam is significantly smaller currently, consistent with improving cavitary pneumonia. Coronary artery calcifications are noted. Aortic Atherosclerosis (ICD10-I70.0) and  Emphysema (ICD10-J43.9). Electronically Signed   By: Lupita Raider M.D.   On: 12/19/2022 18:00      LOS: 2 days     Joycelyn Das, MD [facility] Available via Epic secure chat 7am-7pm After these hours, please refer to coverage provider listed on [email or web address] 12/21/2022, 2:04 PM

## 2022-12-21 NOTE — TOC Initial Note (Signed)
Transition of Care [facility]) - Initial/Assessment Note    Patient Details  Name: Michaela Baker MRN: [id number] Date of Birth: December 17, 1950  Transition of Care [facility]) CM/SW Contact:    Leone Haven, RN Phone Number: 12/21/2022, 4:45 PM  Clinical Narrative:                 From home alone, she has PCP, Dr. Dareen Piano, she has insurance on file, she is active with [facility] for HHPT, HHOT and she states she would like to continue with their services, this NCM confirmed with Haywood Lasso.  She has walker, cane, bsc, shower chair at  home.  Daughter, Loletha Carrow is at the bedside and states she or her brother , Olena Leatherwood will transport her home at dc.  They are her support system.  Pta she is self ambulatory with a walker at home mostly.  She states she will need a Rollator and she does not have a preference of the agency.  NCM made referral to [facility] with [employer] for rollator.  He will bring to room tomorrow.   Expected Discharge Plan: Home w Home Health Services     Patient Goals and CMS Choice Patient states their goals for this hospitalization and ongoing recovery are:: return home with [facility] CMS Medicare.gov Compare Post Acute Care list provided to:: Patient Choice offered to / list presented to : Patient, Adult Children      Expected Discharge Plan and Services In-house Referral: NA Discharge Planning Services: CM Consult Post Acute Care Choice: Home Health, Resumption of Svcs/PTA Provider Living arrangements for the past 2 months: Single Family Home                 DME Arranged: N/A DME Agency: NA       HH Arranged: PT, OT HH Agency: [facility] Date HH Agency Contacted: 12/21/22 Time HH Agency Contacted: 1643 Representative spoke with at [facility] Agency: Haywood Lasso  Prior Living Arrangements/Services Living arrangements for the past 2 months: Single Family Home Lives with:: Self Patient language and need for interpreter reviewed:: Yes Do you feel safe going back to the place  where you live?: Yes      Need for Family Participation in Patient Care: Yes (Comment) Care giver support system in place?: Yes (comment) Current home services: DME (walker, cane, bsc, shower chair) Criminal Activity/Legal Involvement Pertinent to Current Situation/Hospitalization: No - Comment as needed  Activities of Daily Living Home Assistive Devices/Equipment: Walker (specify type), Cane (specify quad or straight), Bedside commode/3-in-1, Shower chair with back, Grab bars in shower ADL Screening (condition at time of admission) Patient's cognitive ability adequate to safely complete daily activities?: Yes Is the patient deaf or have difficulty hearing?: No Does the patient have difficulty seeing, even when wearing glasses/contacts?: No Does the patient have difficulty concentrating, remembering, or making decisions?: No Patient able to express need for assistance with ADLs?: Yes Does the patient have difficulty dressing or bathing?: No Independently performs ADLs?: Yes (appropriate for developmental age) Does the patient have difficulty walking or climbing stairs?: No Weakness of Legs: None Weakness of Arms/Hands: None  Permission Sought/Granted Permission sought to share information with : Case Manager Permission granted to share information with : Yes, Verbal Permission Granted     Permission granted to share info w AGENCY: [facility]        Emotional Assessment Appearance:: Appears stated age Attitude/Demeanor/Rapport: Engaged Affect (typically observed): Appropriate Orientation: : Oriented to Self, Oriented to Place, Oriented to  Time, Oriented to  Situation Alcohol / Substance Use: Not Applicable Psych Involvement: No (comment)  Admission diagnosis:  Hypokalemia [E87.6] Hypoxia [R09.02] Acute diastolic CHF (congestive heart failure) (HCC) [I50.31] Deep vein thrombosis (DVT) of both lower extremities, unspecified chronicity, unspecified vein (HCC) [I82.403] Acute on  chronic congestive heart failure, unspecified heart failure type [facility]) [I50.9] Patient Active Problem List   Diagnosis Date Noted   DVT (deep venous thrombosis) (HCC) 12/20/2022   Hypoglycemia 12/20/2022   Acute diastolic CHF (congestive heart failure) (HCC) 12/19/2022   Aspiration pneumonia (HCC) 11/09/2022   Tongue swelling 11/09/2022   Generalized weakness 11/09/2022   Metabolic acidosis 11/09/2022   AMS (altered mental status) 11/09/2022   Acute kidney injury superimposed on chronic kidney disease (HCC) 10/04/2022   Abnormal weight loss 09/23/2022   Candidiasis of mouth 09/23/2022   Chronic ulcer of ankle (HCC) 06/08/2022   Acute hypoxic respiratory failure (HCC) 06/02/2022   Pulmonary hypertension (HCC) 06/01/2022   Moderate tricuspid regurgitation 06/01/2022   Anemia 05/16/2022   Chronic kidney disease due to type 2 diabetes mellitus (HCC) 05/16/2022   Diverticulosis of colon 01/10/2022   GI bleed 12/27/2021   Premature ventricular contractions    Open wound of lower leg 05/28/2021   Hyperlipidemia 05/07/2021   Sepsis (HCC) 03/04/2021   AKI (acute kidney injury) (HCC) 03/03/2021   Obesity (BMI 30-39.9) 03/03/2021   Hyponatremia 03/03/2021   Abnormal LFTs 03/03/2021   Cellulitis of right lower extremity 03/03/2021   Type 2 diabetes mellitus (HCC) 11/27/2020   Foot pain 08/16/2019   Dyspnea 03/29/2017   Hypothyroidism 03/29/2017   OSA (obstructive sleep apnea) 01/03/2017   Left knee pain 01/03/2017   Benign essential hypertension 10/02/2015   Generalized edema 10/02/2015   Chronic hypokalemia 06/18/2015   MGUS (monoclonal gammopathy of unknown significance) 07/26/2013   CHF (congestive heart failure) (HCC) 07/22/2013   Hypertension 07/22/2013   Acute exacerbation of CHF (congestive heart failure) (HCC) 07/21/2013   PCP:  Diamantina Providence, FNP Pharmacy:   [facility] [place], [place] - [address] [place] [address] Phone:  [phone] Fax: [phone]  [id number] [place], [place] - [address] AT [facility] [address] [place] [address] Phone: [phone] Fax: [phone]     Social Determinants of Health (SDOH) Social History: SDOH Screenings   Food Insecurity: No Food Insecurity (12/20/2022)  Housing: Low Risk  (12/20/2022)  Transportation Needs: No Transportation Needs (12/20/2022)  Utilities: Not At Risk (12/20/2022)  Social Connections: Unknown (10/08/2021)   Received from [facility]  Tobacco Use: Low Risk  (12/19/2022)   SDOH Interventions:     Readmission Risk Interventions    11/10/2022   11:07 AM 06/03/2022    4:43 PM  Readmission Risk Prevention Plan  Transportation Screening Complete Complete  PCP or Specialist Appt within 5-7 Days  Complete  PCP or Specialist Appt within 3-5 Days Complete   Home Care Screening  Complete  Medication Review (RN CM)  Complete  HRI or Home Care Consult Complete   Social Work Consult for Recovery Care Planning/Counseling Complete   Palliative Care Screening Not Applicable   Medication Review Oceanographer) Complete

## 2022-12-22 DIAGNOSIS — I824Y3 Acute embolism and thrombosis of unspecified deep veins of proximal lower extremity, bilateral: Secondary | ICD-10-CM | POA: Diagnosis not present

## 2022-12-22 DIAGNOSIS — E876 Hypokalemia: Secondary | ICD-10-CM | POA: Diagnosis not present

## 2022-12-22 DIAGNOSIS — I5031 Acute diastolic (congestive) heart failure: Secondary | ICD-10-CM | POA: Diagnosis not present

## 2022-12-22 DIAGNOSIS — J9601 Acute respiratory failure with hypoxia: Secondary | ICD-10-CM | POA: Diagnosis not present

## 2022-12-22 LAB — CBC
HCT: 27.8 % — ABNORMAL LOW (ref 36.0–46.0)
Hemoglobin: 8.3 g/dL — ABNORMAL LOW (ref 12.0–15.0)
MCH: 26.5 pg (ref 26.0–34.0)
MCHC: 29.9 g/dL — ABNORMAL LOW (ref 30.0–36.0)
MCV: 88.8 fL (ref 80.0–100.0)
Platelets: 194 10*3/uL (ref 150–400)
RBC: 3.13 MIL/uL — ABNORMAL LOW (ref 3.87–5.11)
RDW: 18.5 % — ABNORMAL HIGH (ref 11.5–15.5)
WBC: 4.2 10*3/uL (ref 4.0–10.5)
nRBC: 0 % (ref 0.0–0.2)

## 2022-12-22 LAB — BASIC METABOLIC PANEL
Anion gap: 6 (ref 5–15)
CO2: 25 mmol/L (ref 22–32)
Chloride: 108 mmol/L (ref 98–111)
Creatinine, Ser: 1.28 mg/dL — ABNORMAL HIGH (ref 0.44–1.00)
Potassium: 3.8 mmol/L (ref 3.5–5.1)
Sodium: 139 mmol/L (ref 135–145)

## 2022-12-22 LAB — MAGNESIUM: Magnesium: 1.7 mg/dL (ref 1.7–2.4)

## 2022-12-22 MED ORDER — DAPAGLIFLOZIN PROPANEDIOL 10 MG PO TABS
10.0000 mg | ORAL_TABLET | Freq: Every day | ORAL | Status: DC
  Administered 2022-12-22 – 2022-12-25 (×4): 10 mg via ORAL
  Filled 2022-12-22 (×4): qty 1

## 2022-12-22 MED ORDER — FUROSEMIDE 10 MG/ML IJ SOLN
40.0000 mg | Freq: Two times a day (BID) | INTRAMUSCULAR | Status: DC
  Administered 2022-12-22 – 2022-12-25 (×7): 40 mg via INTRAVENOUS
  Filled 2022-12-22 (×7): qty 4

## 2022-12-22 MED ORDER — POTASSIUM CHLORIDE CRYS ER 20 MEQ PO TBCR
40.0000 meq | EXTENDED_RELEASE_TABLET | Freq: Every day | ORAL | Status: AC
  Administered 2022-12-22 – 2022-12-23 (×2): 40 meq via ORAL
  Filled 2022-12-22 (×2): qty 2

## 2022-12-22 MED ORDER — MAGNESIUM SULFATE 2 GM/50ML IV SOLN
2.0000 g | Freq: Once | INTRAVENOUS | Status: AC
  Administered 2022-12-22: 2 g via INTRAVENOUS
  Filled 2022-12-22: qty 50

## 2022-12-22 NOTE — Progress Notes (Addendum)
PROGRESS NOTE    Michaela Baker  ZOX:[id number] DOB: 08/14/1950 DOA: 12/19/2022 PCP: Diamantina Providence, FNP    Brief Narrative:   72 y.o. female with past medical history significant of CHF, hypothyroidism, folic acid deficiency, hyperlipidemia, CKD,  history of aspiration pneumonia, HTN, OSA presented to hospital with a 1 day history of lower extremity edema and shortness of breath.  Patient had initially presented to the office where she was noted to be hypoxic with pulse ox of 77% on room air.  In the ED, chest x-ray showed tiny effusions.  CTA of the chest that did not show any evidence of pulmonary embolism, 2.4 cm right thyroid nodule, small bilateral pleural effusions, interstitial densities..   She used to be on oxygen in the past but had not been using it.  Patient was then considered for admission to hospital for further evaluation and treatment.   Assessment and plan.    Acute hypoxic respiratory failure secondary to acute on chronic  diastolic CHF  Patient had a pulse ox of 88%  on room air on presentation and had  dyspnea and shortness of breath.  Currently, on 7 L of oxygen by nasal cannula.  Will continue to wean as able.  BNP of 549 on presentation., patient had minimally elevated troponins.  On IV Lasix 40 milligrams daily.  Patient takes Lasix 40 twice daily at home.  Will change Lasix to 40 IV twice daily.  Patient is noncompliant with medications as outpatient.  Continue strict intake and output charting, daily weights.   Pulse ox.  Continue beta-blocker.   2D echocardiogram on 12/20/2022 showed LV ejection fraction of 60 to 65% with no regional wall motion abnormality but grade 1 diastolic dysfunction. On farxiga at home, will resume.  Continue beta-blocker.  Patient is negative balance for 555 ml without much diuresis.. PT OT recommend home health on discharge.  Patient is still decompensated.  Hypokalemia:  Patient improved after oral potassium replacement.  Potassium of 3.8  today.  Will give additional 40 meq today.  Hypomagnesemia.  Was replenished.  Latest magnesium of 1.7. will give additional 2 g today since her diuretics will be increased.  Mild hypophosphatemia.  Improved after replacement.  Latest phosphorus of 3.4.  Acute bilateral DVT:  Venous duplex showed bilateral DVT.  Was initially on heparin drip which has been changed to Eliquis.   CTA of the chest without PE.  Denies overt pain in the legs.   Iron deficiency anemia:  Hemoglobin of 8. 3 today from 8.9.  No obvious bleeding.Marland Kitchen  Received IV iron infusion while in the hospital.  Received 2 doses of IV Venofer.  Elevated troponins probably from demand ischemia from CHF.  Will closely monitor.  Continue diuretics and beta-blockers.  Increase dose of Lasix to 40 mg IV twice daily for now  Obesity.  Body mass index is 31.99 kg/m.Patient would benefit from weight loss as outpatient.    Hypothyroidism; TSH less than 0.01, free T41.1 and elevated, total T3 within normal range.  Hold off with Synthroid for now.  Patient takes 12.5 mcg at home.    Hyperlipidemia;  Continue Crestor.  Hypertension. Patient is on amlodipine, hydralazine and metoprolol at home.  Will continue to hold.  Will be on Lasix IV twice daily.  Blood pressure still stable.  Debility, weakness.  Physical therapy has seen the patient and recommend home health on discharge.      DVT prophylaxis:  apixaban (ELIQUIS) tablet 10 mg  apixaban (  ELIQUIS) tablet 5 mg   Code Status:     Code Status: Full Code  Disposition: Home with home health in 1 to 2 days, pending clinical improvement  Status is: Inpatient  Remains inpatient appropriate because: decompensated heart failure, pending clinical improvement, IV diuretics   Family Communication:  spoke with the patient's daughter on 12/21/2022.  Consultants:  None  Procedures:  None  Antimicrobials:  None  Anti-infectives (From admission, onward)    None       Subjective: Today, patient was seen and examined at bedside.  Patient states that she feels okay but has been having shortness of breath especially on exertion.  Denies any chest pain, dizziness, lightheadedness.  Objective: Vitals:   12/21/22 1912 12/22/22 0054 12/22/22 0431 12/22/22 0716  BP: (!) 124/53 117/79 (!) 123/58 137/71  Pulse: 82 71 83 93  Resp: 20 19 (!) 21 20  Temp: 99 F (37.2 C) 99.3 F (37.4 C) 99.9 F (37.7 C) 99.5 F (37.5 C)  TempSrc: Oral Oral Oral Oral  SpO2: 93% 90% 93% 91%  Weight:   83.6 kg   Height:        Intake/Output Summary (Last 24 hours) at 12/22/2022 1150 Last data filed at 12/22/2022 0843 Gross per 24 hour  Intake 618.25 ml  Output 800 ml  Net -181.75 ml   Filed Weights   12/20/22 1402 12/21/22 0500 12/22/22 0431  Weight: 87.2 kg 86.1 kg 83.6 kg    Physical Examination: Body mass index is 30.67 kg/m.   General: Obese built, not in obvious distress, on 7 L of oxygen by nasal cannula. HENT:   No scleral pallor or icterus noted. Oral mucosa is moist.  Chest:   Diminished breath sounds bilaterally.  Coarse breathing with some crackles noted.  CVS: S1 &S2 heard. No murmur.  Regular rate and rhythm. Abdomen: Soft, nontender, nondistended.  Bowel sounds are heard.   Extremities: No cyanosis, clubbing but with bilateral lower extremity edema.  Peripheral pulses are palpable. Psych: Alert, awake and oriented, normal mood CNS:  No cranial nerve deficits.  Power equal in all extremities.   Skin: Warm and dry.  No rashes noted.  Data Reviewed:   CBC: Recent Labs  Lab 12/19/22 1258 12/20/22 1456 12/21/22 0956 12/22/22 0132  WBC 4.4 5.4 3.5* 4.2  NEUTROABS 2.8  --   --   --   HGB 9.0* 8.9* 9.2* 8.3*  HCT 30.5* 30.9* 31.8* 27.8*  MCV 91.9 88.3 90.9 88.8  PLT 228 241 238 194    Basic Metabolic Panel: Recent Labs  Lab 12/19/22 2333 12/20/22 0900 12/20/22 1456 12/21/22 0956 12/22/22 0132  NA 139 140 141 140 139  K 2.5* 3.2* 3.2*  3.0* 3.8  CL 108 111 109 108 108  CO2 22 21* 24 21* 25  GLUCOSE 99 81 93 112* 84  BUN 11 11 9 10 12   CREATININE 1.18* 1.13* 1.38* 1.34* 1.28*  CALCIUM 8.0* 7.8* 8.1* 8.2* 7.8*  MG 1.6* 2.0 1.9  --  1.7  PHOS 2.5  --  2.4* 3.4  --     Liver Function Tests: Recent Labs  Lab 12/19/22 1258 12/20/22 1456  AST 26 19  ALT 11 10  ALKPHOS 45 46  BILITOT 0.5 0.5  PROT 7.0 7.2  ALBUMIN 2.2* 2.1*     Radiology Studies: No results found.    LOS: 3 days     Joycelyn Das, MD [facility] Available via Epic secure chat 7am-7pm After these hours,  please refer to coverage provider listed on [email or web address] 12/22/2022, 11:50 AM

## 2022-12-22 NOTE — Progress Notes (Signed)
Occupational Therapy Treatment Patient Details Name: Michaela Baker MRN: [id number] DOB: 01/06/1951 Today's Date: 12/22/2022   History of present illness Patient is 72 y.o. female presented from PCP for LE edema and hypoxia. PMH significant for HTN, CHF, PVCs, CKD, HLD, OSA, hypothyroidism. CT negative for PE, age indeterminate DVT's noted in bil LE's.   OT comments  Pt making progress with functional goals. Pt seated in recliner after working with PT. Pt reports that she spends a lot of time in bed at home and therapist encouraged pt to be more active at home and educates pt on energy conservation strategies with handout provided. O2 SATs dropping from 90% to 86% once pt walking to bathroom on 6L O2. Pt instructed on deep, pursed lip breathing to recover to 88%. OT will continue to follow acutely to maximize level of function and safety   Recommendations for follow up therapy are one component of a multi-disciplinary discharge planning process, led by the attending physician.  Recommendations may be updated based on patient status, additional functional criteria and insurance authorization.    Assistance Recommended at Discharge Intermittent Supervision/Assistance  Patient can return home with the following  A little help with walking and/or transfers;A little help with bathing/dressing/bathroom;Assistance with cooking/housework   Equipment Recommendations  BSC/3in1;Tub/shower seat;Other (comment) (long handle bath sponge)    Recommendations for Other Services      Precautions / Restrictions Precautions Precautions: Fall Restrictions Weight Bearing Restrictions: No       Mobility Bed Mobility               General bed mobility comments: pt in recliner upon arrival    Transfers Overall transfer level: Needs assistance Equipment used: Rolling walker (2 wheels) Transfers: Sit to/from Stand Sit to Stand: Min guard                 Balance Overall balance assessment:  Needs assistance Sitting-balance support: Feet supported Sitting balance-Leahy Scale: Good     Standing balance support: During functional activity, No upper extremity supported, Single extremity supported Standing balance-Leahy Scale: Fair                             ADL either performed or assessed with clinical judgement   ADL Overall ADL's : Needs assistance/impaired     Grooming: Wash/dry hands;Wash/dry face;Min Production designer, theatre/television/film: Min guard;Ambulation;BSC/3in1;Rolling walker (2 wheels)   Toileting- Clothing Manipulation and Hygiene: Min guard;Sit to/from stand       Functional mobility during ADLs: Min guard;Rolling walker (2 wheels) General ADL Comments: O2 SATs dropping from 90% to 86% once pt walking to bathroom on 6L O2. Pt instructed on deep, pursed lip breathing to recover to 88%    Extremity/Trunk Assessment Upper Extremity Assessment Upper Extremity Assessment: Overall WFL for tasks assessed   Lower Extremity Assessment Lower Extremity Assessment: Defer to PT evaluation        Vision Ability to See in Adequate Light: 0 Adequate Patient Visual Report: No change from baseline     Perception     Praxis      Cognition Arousal/Alertness: Awake/alert Behavior During Therapy: WFL for tasks assessed/performed Overall Cognitive Status: Within Functional Limits for tasks assessed  Exercises      Shoulder Instructions       General Comments      Pertinent Vitals/ Pain       Pain Assessment Pain Assessment: No/denies pain  Home Living                                          Prior Functioning/Environment              Frequency  Min 2X/week        Progress Toward Goals  OT Goals(current goals can now be found in the care plan section)  Progress towards OT goals: Progressing toward goals     Plan Discharge  plan remains appropriate    Co-evaluation                 AM-PAC OT "6 Clicks" Daily Activity     Outcome Measure   Help from another person eating meals?: None Help from another person taking care of personal grooming?: A Little Help from another person toileting, which includes using toliet, bedpan, or urinal?: A Little Help from another person bathing (including washing, rinsing, drying)?: A Little Help from another person to put on and taking off regular upper body clothing?: A Little Help from another person to put on and taking off regular lower body clothing?: A Little 6 Click Score: 19    End of Session Equipment Utilized During Treatment: Rolling walker (2 wheels);Gait belt;Oxygen  OT Visit Diagnosis: Unsteadiness on feet (R26.81);Other abnormalities of gait and mobility (R26.89);Muscle weakness (generalized) (M62.81)   Activity Tolerance Patient tolerated treatment well   Patient Left in chair;with call bell/phone within reach;with chair alarm set   Nurse Communication          Time: [phone] OT Time Calculation (min): 24 min  Charges: OT General Charges $OT Visit: 1 Visit OT Treatments $Self Care/Home Management : 8-22 mins $Therapeutic Activity: 8-22 mins    Galen Manila 12/22/2022, 3:15 PM

## 2022-12-22 NOTE — Plan of Care (Signed)
  Problem: Skin Integrity: Goal: Risk for impaired skin integrity will decrease Outcome: Progressing   

## 2022-12-22 NOTE — Care Management Important Message (Signed)
Important Message  Patient Details  Name: Michaela Baker MRN: [id number] Date of Birth: 02-04-1951   Medicare Important Message Given:  Yes     Renie Ora 12/22/2022, 8:32 AM

## 2022-12-22 NOTE — Progress Notes (Signed)
Physical Therapy Treatment Patient Details Name: Michaela Baker MRN: [id number] DOB: 09-27-1950 Today's Date: 12/22/2022   History of Present Illness Patient is 72 y.o. female presented from PCP for LE edema and hypoxia. PMH significant for HTN, CHF, PVCs, CKD, HLD, OSA, hypothyroidism. CT negative for PE, age indeterminate DVT's noted in bil LE's.    PT Comments  Pt admitted with above diagnosis. Pt was able to ambulate with RW needing 6LO2 at rest and 8LO2 with activity to maintain O2 saturation. Pt progressed ambulation distance and was educated as to purse lip breathing to improve saturations. Will continue PT.  Pt currently with functional limitations due to the deficits listed below (see PT Problem List). Pt will benefit from acute skilled PT to increase their independence and safety with mobility to allow discharge.        Assistance Recommended at Discharge Frequent or constant Supervision/Assistance  If plan is discharge home, recommend the following:  Can travel by private vehicle    A little help with walking and/or transfers;A little help with bathing/dressing/bathroom;Assistance with cooking/housework;Assist for transportation;Help with stairs or ramp for entrance      Equipment Recommendations  Rollator (4 wheels)    Recommendations for Other Services       Precautions / Restrictions Precautions Precautions: Fall Restrictions Weight Bearing Restrictions: No     Mobility  Bed Mobility Overal bed mobility: Needs Assistance Bed Mobility: Supine to Sit     Supine to sit: Min guard, HOB elevated     General bed mobility comments: On arrival, pt had removed O2 and O2 saturation was 74% once monitor placed on pt.  REplaced pts O2 at  6L and took a few min to return to 92%.  Pt needed minguard for safety to come to EOB.    Transfers Overall transfer level: Needs assistance Equipment used: Rolling walker (2 wheels) Transfers: Sit to/from Stand Sit to Stand: Min  guard           General transfer comment: pt used hands for boost and then manipulated and pulled up undergarment without assist.    Ambulation/Gait Ambulation/Gait assistance: Min guard Gait Distance (Feet): 150 Feet Assistive device: Rolling walker (2 wheels) Gait Pattern/deviations: Step-through pattern, Decreased step length - right, Decreased step length - left, Decreased stride length, Trunk flexed Gait velocity: decr Gait velocity interpretation: <1.31 ft/sec, indicative of household ambulator   General Gait Details: cues for proximity to RW and posture.  Sats on 8+ L [place] dropping to 85% but with pursed lip breathing, pt could keep sats >88% on 8L with activity.  Needed 6LO2 at rest for sats >88%.   Stairs             Wheelchair Mobility     Tilt Bed    Modified Rankin (Stroke Patients Only)       Balance Overall balance assessment: Needs assistance Sitting-balance support: Feet supported Sitting balance-Leahy Scale: Good     Standing balance support: During functional activity, No upper extremity supported, Single extremity supported Standing balance-Leahy Scale: Fair Standing balance comment: able to clean self after using 3N1.                            Cognition Arousal/Alertness: Awake/alert Behavior During Therapy: WFL for tasks assessed/performed Overall Cognitive Status: Within Functional Limits for tasks assessed  Exercises Other Exercises Other Exercises: coaching for efficient breathing    General Comments        Pertinent Vitals/Pain Pain Assessment Pain Assessment: No/denies pain    Home Living                          Prior Function            PT Goals (current goals can now be found in the care plan section) Acute Rehab PT Goals Patient Stated Goal: not be so SOB Progress towards PT goals: Progressing toward goals    Frequency    Min  3X/week      PT Plan Current plan remains appropriate    Co-evaluation              AM-PAC PT "6 Clicks" Mobility   Outcome Measure  Help needed turning from your back to your side while in a flat bed without using bedrails?: A Little Help needed moving from lying on your back to sitting on the side of a flat bed without using bedrails?: A Little Help needed moving to and from a bed to a chair (including a wheelchair)?: A Little Help needed standing up from a chair using your arms (e.g., wheelchair or bedside chair)?: A Little Help needed to walk in hospital room?: A Little Help needed climbing 3-5 steps with a railing? : A Lot 6 Click Score: 17    End of Session Equipment Utilized During Treatment: Gait belt;Oxygen Activity Tolerance: Patient limited by fatigue Patient left: with call bell/phone within reach;in chair;with chair alarm set Nurse Communication: Mobility status PT Visit Diagnosis: Difficulty in walking, not elsewhere classified (R26.2);Muscle weakness (generalized) (M62.81);Other abnormalities of gait and mobility (R26.89)     Time: 7829-5621 PT Time Calculation (min) (ACUTE ONLY): 31 min  Charges:    $Gait Training: 23-37 mins PT General Charges $$ ACUTE PT VISIT: 1 Visit                     Tifanny Dollens M,PT Acute Rehab Services [phone]    Bevelyn Buckles 12/22/2022, 12:59 PM

## 2022-12-23 ENCOUNTER — Ambulatory Visit: Payer: 59

## 2022-12-23 DIAGNOSIS — J9601 Acute respiratory failure with hypoxia: Secondary | ICD-10-CM | POA: Diagnosis not present

## 2022-12-23 DIAGNOSIS — E876 Hypokalemia: Secondary | ICD-10-CM | POA: Diagnosis not present

## 2022-12-23 DIAGNOSIS — I824Y3 Acute embolism and thrombosis of unspecified deep veins of proximal lower extremity, bilateral: Secondary | ICD-10-CM | POA: Diagnosis not present

## 2022-12-23 DIAGNOSIS — I5031 Acute diastolic (congestive) heart failure: Secondary | ICD-10-CM | POA: Diagnosis not present

## 2022-12-23 MED ORDER — POTASSIUM CHLORIDE CRYS ER 20 MEQ PO TBCR
40.0000 meq | EXTENDED_RELEASE_TABLET | Freq: Every day | ORAL | Status: DC
  Administered 2022-12-23 – 2022-12-25 (×3): 40 meq via ORAL
  Filled 2022-12-23 (×3): qty 2

## 2022-12-23 NOTE — Progress Notes (Signed)
PROGRESS NOTE    Michaela Baker  OVF:[id number] DOB: 05-26-1951 DOA: 12/19/2022 PCP: Diamantina Providence, FNP    Brief Narrative:   72 y.o. female with past medical history significant of CHF, hypothyroidism, folic acid deficiency, hyperlipidemia, CKD,  history of aspiration pneumonia, HTN, OSA presented to hospital with a 1 day history of lower extremity edema and shortness of breath.  Patient had initially presented to the office where she was noted to be hypoxic with pulse ox of 77% on room air.  In the ED, chest x-ray showed tiny effusions.  CTA of the chest that did not show any evidence of pulmonary embolism, 2.4 cm right thyroid nodule, small bilateral pleural effusions, interstitial densities. Patient used to be on oxygen in the past but had not been using it.  Patient was then considered for admission to hospital for further evaluation and treatment.  Assessment and plan.   Acute hypoxic respiratory failure secondary to acute on chronic  diastolic CHF  Patient had a pulse ox of 88%  on room air on presentation and had  dyspnea and shortness of breath.  Currently, on 4 L of oxygen by nasal cannula.  Will continue to wean as able.  BNP of 549 on presentation., patient had minimally elevated troponins.   Patient takes Lasix 40 twice daily at home. Lasix has been changed to to 40 mg IV twice daily since 7/25 with significant diuresis today.  Patient is noncompliant with medications as outpatient.  Continue strict intake and output charting, daily weights.  2D echocardiogram on 12/20/2022 showed LV ejection fraction of 60 to 65% with no regional wall motion abnormality but grade 1 diastolic dysfunction.  Continue beta-blocker and farxiga.  Patient is negative balance for 2427 ml with 2525 ml output. PT OT recommend home health on discharge.  Patient is still decompensated.  Hypokalemia:  Improved. Latest potassium 3.8  Hypomagnesemia. Improved. Latest magnesium of 1.9  Mild hypophosphatemia.   Improved after replacement.  Latest phosphorus of 3.4.  Acute bilateral DVT:  Venous duplex showed bilateral DVT.  Was initially on heparin drip which has been changed to Eliquis.   CTA of the chest without PE.  Denies overt pain in the legs.   Iron deficiency anemia:  Latest hemoglobin of 8. 7.  Received 2 doses of IV Venofer.  Elevated troponins probably from demand ischemia from CHF.  Will closely monitor.  Continue diuretics and beta-blockers.    Obesity.  Body mass index is 31.99 kg/m.Patient would benefit from weight loss as outpatient.    Hypothyroidism; TSH less than 0.01, free T41.1 and elevated, total T3 within normal range.  Hold off with Synthroid for now.  Patient takes 12.5 mcg at home. Restart by am.    Hyperlipidemia;  Continue Crestor.  Hypertension. Patient is on amlodipine, hydralazine and metoprolol at home.  Will continue to hold.   on Lasix IV twice daily, metoprolol.  Blood pressure still stable.  Debility, weakness.  Physical therapy has seen the patient and recommend home health on discharge.      DVT prophylaxis:  apixaban (ELIQUIS) tablet 10 mg  apixaban (ELIQUIS) tablet 5 mg   Code Status:     Code Status: Full Code  Disposition: Home with home health in 1 to 2 days, pending clinical improvement  Status is: Inpatient  Remains inpatient appropriate because: decompensated heart failure, pending clinical improvement, IV diuretics, on supplemental oxygen   Family Communication:   Spoke with the patient's daughter on 12/21/2022.  Consultants:  None  Procedures:  None  Antimicrobials:  None  Anti-infectives (From admission, onward)    None      Subjective: Today, patient was seen and examined at bedside. Feels better with shortness of breath, less dyspneic. Has been having good diuresis.    Objective: Vitals:   12/23/22 0330 12/23/22 0444 12/23/22 0729 12/23/22 1129  BP: 139/70  124/62 126/89  Pulse: 81  74 71  Resp: (!) 22  16 20    Temp: 99.6 F (37.6 C)  99.2 F (37.3 C) 98.1 F (36.7 C)  TempSrc: Oral  Oral Oral  SpO2: 91%  95% 98%  Weight:  84.7 kg    Height:        Intake/Output Summary (Last 24 hours) at 12/23/2022 1241 Last data filed at 12/23/2022 1033 Gross per 24 hour  Intake 853.06 ml  Output 2725 ml  Net -1871.94 ml   Filed Weights   12/21/22 0500 12/22/22 0431 12/23/22 0444  Weight: 86.1 kg 83.6 kg 84.7 kg    Physical Examination: Body mass index is 31.07 kg/m.   General: Obese built, not in obvious distress, on 4 L of oxygen by nasal cannula. HENT:   No scleral pallor or icterus noted. Oral mucosa is moist.  Chest:   Diminished breath sounds bilaterally.  Coarse breath sounds. CVS: S1 &S2 heard. No murmur.  Regular rate and rhythm. Abdomen: Soft, nontender, nondistended.  Bowel sounds are heard.   Extremities: No cyanosis, clubbing but with bilateral lower extremity edema.  Peripheral pulses are palpable. Psych: Alert, awake and oriented, normal mood CNS:  No cranial nerve deficits.  Power equal in all extremities.   Skin: Warm and dry.  No rashes noted.  Data Reviewed:   CBC: Recent Labs  Lab 12/19/22 1258 12/20/22 1456 12/21/22 0956 12/22/22 0132 12/23/22 0125  WBC 4.4 5.4 3.5* 4.2 5.9  NEUTROABS 2.8  --   --   --   --   HGB 9.0* 8.9* 9.2* 8.3* 8.7*  HCT 30.5* 30.9* 31.8* 27.8* 29.6*  MCV 91.9 88.3 90.9 88.8 87.6  PLT 228 241 238 194 218    Basic Metabolic Panel: Recent Labs  Lab 12/19/22 2333 12/20/22 0900 12/20/22 1456 12/21/22 0956 12/22/22 0132 12/23/22 0125  NA 139 140 141 140 139 139  K 2.5* 3.2* 3.2* 3.0* 3.8 3.8  CL 108 111 109 108 108 104  CO2 22 21* 24 21* 25 26  GLUCOSE 99 81 93 112* 84 90  BUN 11 11 9 10 12 15   CREATININE 1.18* 1.13* 1.38* 1.34* 1.28* 1.30*  CALCIUM 8.0* 7.8* 8.1* 8.2* 7.8* 8.3*  MG 1.6* 2.0 1.9  --  1.7 1.9  PHOS 2.5  --  2.4* 3.4  --  3.0    Liver Function Tests: Recent Labs  Lab 12/19/22 1258 12/20/22 1456  AST 26 19   ALT 11 10  ALKPHOS 45 46  BILITOT 0.5 0.5  PROT 7.0 7.2  ALBUMIN 2.2* 2.1*     Radiology Studies: No results found.    LOS: 4 days     Joycelyn Das, MD [facility] Available via Epic secure chat 7am-7pm After these hours, please refer to coverage provider listed on [email or web address] 12/23/2022, 12:41 PM

## 2022-12-23 NOTE — Progress Notes (Signed)
Mobility Specialist Progress Note:    12/23/22 1020  Mobility  Activity Ambulated with assistance in room  Level of Assistance Contact guard assist, steadying assist  Assistive Device Four wheel walker  Distance Ambulated (ft) 10 ft  Activity Response Tolerated well  Mobility Referral Yes  $Mobility charge 1 Mobility  Mobility Specialist Start Time (ACUTE ONLY) [phone]  Mobility Specialist Stop Time (ACUTE ONLY) 1007  Mobility Specialist Time Calculation (min) (ACUTE ONLY) 24 min   Pt received in bed, agreeable to participate in session. Upon arrival pt was wet from urinary incontinence requesting to be cleaned up and a bed change. Ambulated to sink and sat down on BSC. No c/o throughout. Pt left w/ NT in room. All needs met.  Thompson Grayer Mobility Specialist  Please contact vis Secure Chat or  Rehab Office [phone]

## 2022-12-24 DIAGNOSIS — I824Y3 Acute embolism and thrombosis of unspecified deep veins of proximal lower extremity, bilateral: Secondary | ICD-10-CM | POA: Diagnosis not present

## 2022-12-24 DIAGNOSIS — I5031 Acute diastolic (congestive) heart failure: Secondary | ICD-10-CM | POA: Diagnosis not present

## 2022-12-24 DIAGNOSIS — E876 Hypokalemia: Secondary | ICD-10-CM | POA: Diagnosis not present

## 2022-12-24 DIAGNOSIS — J9601 Acute respiratory failure with hypoxia: Secondary | ICD-10-CM | POA: Diagnosis not present

## 2022-12-24 MED ORDER — AMLODIPINE BESYLATE 10 MG PO TABS
10.0000 mg | ORAL_TABLET | Freq: Every day | ORAL | Status: DC
  Administered 2022-12-24 – 2022-12-25 (×2): 10 mg via ORAL
  Filled 2022-12-24 (×2): qty 1

## 2022-12-24 MED ORDER — METOLAZONE 5 MG PO TABS
5.0000 mg | ORAL_TABLET | Freq: Once | ORAL | Status: AC
  Administered 2022-12-24: 5 mg via ORAL
  Filled 2022-12-24: qty 1

## 2022-12-24 MED ORDER — LEVOTHYROXINE SODIUM 25 MCG PO TABS
12.5000 ug | ORAL_TABLET | Freq: Every day | ORAL | Status: DC
  Administered 2022-12-25: 12.5 ug via ORAL
  Filled 2022-12-24: qty 1

## 2022-12-24 NOTE — Progress Notes (Signed)
PROGRESS NOTE    Michaela Baker  ZOX:[id number] DOB: 10-14-1950 DOA: 12/19/2022 PCP: Diamantina Providence, FNP    Brief Narrative:   72 y.o. female with past medical history significant of CHF, hypothyroidism, folic acid deficiency, hyperlipidemia, CKD,  history of aspiration pneumonia, HTN, OSA presented to hospital with a 1 day history of lower extremity edema and shortness of breath.  Patient had initially presented to the office where she was noted to be hypoxic with pulse ox of 77% on room air.  In the ED, chest x-ray showed tiny effusions.  CTA of the chest that did not show any evidence of pulmonary embolism, 2.4 cm right thyroid nodule, small bilateral pleural effusions, interstitial densities. Patient used to be on oxygen in the past but had not been using it.  Patient was then considered for admission to hospital for further evaluation and treatment.  Assessment and plan.   Acute hypoxic respiratory failure secondary to acute on chronic  diastolic CHF Patient had a pulse ox of 88%  on room air on presentation and had  dyspnea and shortness of breath.  Currently, on 2 L of oxygen by nasal cannula.  Will continue to wean as able.  BNP of 549 on presentation., patient had minimally elevated troponins.   Patient takes Lasix 40 mg twice daily at home. Lasix has been changed to to 40 mg IV twice daily since 7/25 , will add a dose of zaroxylyn today.  Patient is noncompliant with medications as outpatient.  Continue strict intake and output charting, daily weights.  2D echocardiogram on 12/20/2022 showed LV ejection fraction of 60 to 65% with no regional wall motion abnormality but grade 1 diastolic dysfunction.  Continue beta-blocker and farxiga.  Patient is negative balance for 4217 ml with 1170 ml output. PT OT recommend home health on discharge.  Patient would benefit from 1 more day of IV diuresis in the hospital.  Hypokalemia:  Improved. Latest potassium 3.8.  Check BMP in  AM.  Hypomagnesemia. Improved. Latest magnesium of 1.9.  Check levels in AM.  Mild hypophosphatemia.  Improved after replacement.  Latest phosphorus of 3.4.  Acute bilateral DVT:  Venous duplex showed bilateral DVT.  On Eliquis.   CTA of the chest without PE.  Denies overt pain in the legs.   Iron deficiency anemia:  Latest hemoglobin of 8. 7.  Received 2 doses of IV Venofer.  Check CBC in AM  Elevated troponins probably from demand ischemia from CHF.  Will closely monitor.  Continue diuretics and beta-blockers.    Obesity.  Body mass index is 31.99 kg/m.Patient would benefit from weight loss as outpatient.    Hypothyroidism; TSH less than 0.01, free T41.1 and elevated, total T3 within normal range.  Hold off with Synthroid for now.  Patient takes 12.5 mcg at home. Restart today.    Hyperlipidemia;  Continue Crestor.  Hypertension. Patient is on amlodipine, hydralazine and metoprolol at home.  Will continue to hold.   on Lasix IV twice daily, metoprolol.  Will resume amlodipine.  DC hydralazine on discharge if blood pressure stable.     Debility, weakness.  Physical therapy has seen the patient and recommend home health on discharge.      DVT prophylaxis:  apixaban (ELIQUIS) tablet 10 mg  apixaban (ELIQUIS) tablet 5 mg   Code Status:     Code Status: Full Code  Disposition: Home with home health likely on 12/25/2022, pending clinical improvement.  Aim to wean off oxygen as able.  Status is: Inpatient  Remains inpatient appropriate because: decompensated heart failure, pending clinical improvement, IV diuretics, on supplemental oxygen   Family Communication:   Spoke with the patient's daughter on 12/21/2022.  Consultants:  None  Procedures:  None  Antimicrobials:  None  Anti-infectives (From admission, onward)    None      Subjective: Today, patient was seen and examined at bedside.  Continues to feel better.  Denies any chest pain, dizziness,  lightheadedness, nausea, vomiting.  She was able to sleep better.    Objective: Vitals:   12/24/22 0002 12/24/22 0529 12/24/22 0537 12/24/22 0740  BP: 113/71 (!) 142/71  (!) 128/59  Pulse: 74 80  62  Resp: (!) 25 20  19   Temp: 98.9 F (37.2 C) 99.8 F (37.7 C)  97.9 F (36.6 C)  TempSrc: Oral Oral  Oral  SpO2: 94% 96%  92%  Weight:   84.4 kg   Height:        Intake/Output Summary (Last 24 hours) at 12/24/2022 1131 Last data filed at 12/24/2022 0954 Gross per 24 hour  Intake 360 ml  Output 2150 ml  Net -1790 ml   Filed Weights   12/22/22 0431 12/23/22 0444 12/24/22 0537  Weight: 83.6 kg 84.7 kg 84.4 kg    Physical Examination: Body mass index is 30.97 kg/m.   General: Obese built, not in obvious distress, on 2 L of oxygen by nasal cannula. HENT:   No scleral pallor or icterus noted. Oral mucosa is moist.  Chest:   Diminished breath sounds bilaterally.  Coarse breath sounds noted. CVS: S1 &S2 heard. No murmur.  Regular rate and rhythm. Abdomen: Soft, nontender, nondistended.  Bowel sounds are heard.   Extremities: No cyanosis, clubbing but with bilateral lower extremity edema.  Improved edema.  Peripheral pulses are palpable. Psych: Alert, awake and oriented, normal mood CNS:  No cranial nerve deficits.  Power equal in all extremities.   Skin: Warm and dry.  No rashes noted.  Data Reviewed:   CBC: Recent Labs  Lab 12/19/22 1258 12/20/22 1456 12/21/22 0956 12/22/22 0132 12/23/22 0125  WBC 4.4 5.4 3.5* 4.2 5.9  NEUTROABS 2.8  --   --   --   --   HGB 9.0* 8.9* 9.2* 8.3* 8.7*  HCT 30.5* 30.9* 31.8* 27.8* 29.6*  MCV 91.9 88.3 90.9 88.8 87.6  PLT 228 241 238 194 218    Basic Metabolic Panel: Recent Labs  Lab 12/19/22 2333 12/20/22 0900 12/20/22 1456 12/21/22 0956 12/22/22 0132 12/23/22 0125  NA 139 140 141 140 139 139  K 2.5* 3.2* 3.2* 3.0* 3.8 3.8  CL 108 111 109 108 108 104  CO2 22 21* 24 21* 25 26  GLUCOSE 99 81 93 112* 84 90  BUN 11 11 9 10 12 15    CREATININE 1.18* 1.13* 1.38* 1.34* 1.28* 1.30*  CALCIUM 8.0* 7.8* 8.1* 8.2* 7.8* 8.3*  MG 1.6* 2.0 1.9  --  1.7 1.9  PHOS 2.5  --  2.4* 3.4  --  3.0    Liver Function Tests: Recent Labs  Lab 12/19/22 1258 12/20/22 1456  AST 26 19  ALT 11 10  ALKPHOS 45 46  BILITOT 0.5 0.5  PROT 7.0 7.2  ALBUMIN 2.2* 2.1*     Radiology Studies: No results found.    LOS: 5 days     Joycelyn Das, MD [facility] Available via Epic secure chat 7am-7pm After these hours, please refer to coverage provider listed on [email or web address] 12/24/2022,  11:31 AM

## 2022-12-24 NOTE — Plan of Care (Signed)
  Problem: Education: Goal: Ability to describe self-care measures that may prevent or decrease complications (Diabetes Survival Skills Education) will improve Outcome: Progressing Goal: Individualized Educational Video(s) Outcome: Progressing   Problem: Coping: Goal: Ability to adjust to condition or change in health will improve Outcome: Progressing   Problem: Fluid Volume: Goal: Ability to maintain a balanced intake and output will improve Outcome: Progressing   Problem: Health Behavior/Discharge Planning: Goal: Ability to identify and utilize available resources and services will improve Outcome: Progressing Goal: Ability to manage health-related needs will improve Outcome: Progressing   Problem: Metabolic: Goal: Ability to maintain appropriate glucose levels will improve Outcome: Progressing   Problem: Nutritional: Goal: Maintenance of adequate nutrition will improve Outcome: Progressing Goal: Progress toward achieving an optimal weight will improve Outcome: Progressing   Problem: Skin Integrity: Goal: Risk for impaired skin integrity will decrease Outcome: Progressing   Problem: Tissue Perfusion: Goal: Adequacy of tissue perfusion will improve Outcome: Progressing   Problem: Education: Goal: Ability to demonstrate management of disease process will improve Outcome: Progressing Goal: Ability to verbalize understanding of medication therapies will improve Outcome: Progressing Goal: Individualized Educational Video(s) Outcome: Progressing   Problem: Activity: Goal: Capacity to carry out activities will improve Outcome: Progressing   Problem: Cardiac: Goal: Ability to achieve and maintain adequate cardiopulmonary perfusion will improve Outcome: Progressing   Problem: Education: Goal: Knowledge of General Education information will improve Description: Including pain rating scale, medication(s)/side effects and non-pharmacologic comfort measures Outcome:  Progressing   Problem: Health Behavior/Discharge Planning: Goal: Ability to manage health-related needs will improve Outcome: Progressing   Problem: Clinical Measurements: Goal: Ability to maintain clinical measurements within normal limits will improve Outcome: Progressing Goal: Will remain free from infection Outcome: Progressing Goal: Diagnostic test results will improve Outcome: Progressing Goal: Respiratory complications will improve Outcome: Progressing Goal: Cardiovascular complication will be avoided Outcome: Progressing   Problem: Activity: Goal: Risk for activity intolerance will decrease Outcome: Progressing   Problem: Nutrition: Goal: Adequate nutrition will be maintained Outcome: Progressing   Problem: Coping: Goal: Level of anxiety will decrease Outcome: Progressing   Problem: Elimination: Goal: Will not experience complications related to bowel motility Outcome: Progressing Goal: Will not experience complications related to urinary retention Outcome: Progressing   Problem: Pain Managment: Goal: General experience of comfort will improve Outcome: Progressing   Problem: Safety: Goal: Ability to remain free from injury will improve Outcome: Progressing   Problem: Skin Integrity: Goal: Risk for impaired skin integrity will decrease Outcome: Progressing   Problem: Safety: Goal: Non-violent Restraint(s) Outcome: Progressing

## 2022-12-25 DIAGNOSIS — J9601 Acute respiratory failure with hypoxia: Secondary | ICD-10-CM | POA: Diagnosis not present

## 2022-12-25 DIAGNOSIS — I5031 Acute diastolic (congestive) heart failure: Secondary | ICD-10-CM | POA: Diagnosis not present

## 2022-12-25 DIAGNOSIS — I824Y3 Acute embolism and thrombosis of unspecified deep veins of proximal lower extremity, bilateral: Secondary | ICD-10-CM | POA: Diagnosis not present

## 2022-12-25 DIAGNOSIS — E876 Hypokalemia: Secondary | ICD-10-CM | POA: Diagnosis not present

## 2022-12-25 LAB — BASIC METABOLIC PANEL WITH GFR
Anion gap: 7 (ref 5–15)
BUN: 20 mg/dL (ref 8–23)
CO2: 28 mmol/L (ref 22–32)
Calcium: 8.6 mg/dL — ABNORMAL LOW (ref 8.9–10.3)
Chloride: 100 mmol/L (ref 98–111)
Creatinine, Ser: 1.63 mg/dL — ABNORMAL HIGH (ref 0.44–1.00)
GFR, Estimated: 33 mL/min — ABNORMAL LOW (ref >60)
Glucose, Bld: 86 mg/dL (ref 70–99)
Potassium: 3.8 mmol/L (ref 3.5–5.1)
Sodium: 135 mmol/L (ref 135–145)

## 2022-12-25 LAB — CBC
HCT: 29.7 % — ABNORMAL LOW (ref 36.0–46.0)
Hemoglobin: 8.7 g/dL — ABNORMAL LOW (ref 12.0–15.0)
MCH: 25.8 pg — ABNORMAL LOW (ref 26.0–34.0)
MCHC: 29.3 g/dL — ABNORMAL LOW (ref 30.0–36.0)
MCV: 88.1 fL (ref 80.0–100.0)
Platelets: 226 10*3/uL (ref 150–400)
RBC: 3.37 MIL/uL — ABNORMAL LOW (ref 3.87–5.11)
RDW: 18.5 % — ABNORMAL HIGH (ref 11.5–15.5)
WBC: 5.2 10*3/uL (ref 4.0–10.5)
nRBC: 0 % (ref 0.0–0.2)

## 2022-12-25 LAB — MAGNESIUM: Magnesium: 1.7 mg/dL (ref 1.7–2.4)

## 2022-12-25 MED ORDER — APIXABAN 5 MG PO TABS: ORAL_TABLET | ORAL | 0 refills | Status: DC

## 2022-12-25 MED ORDER — POTASSIUM CHLORIDE CRYS ER 20 MEQ PO TBCR: 20.0000 meq | EXTENDED_RELEASE_TABLET | Freq: Every day | ORAL | 0 refills | Status: AC

## 2022-12-25 NOTE — Progress Notes (Signed)
Home oxygen delivered at bedside.

## 2022-12-25 NOTE — Discharge Summary (Signed)
Physician Discharge Summary  Michaela Baker GNF:[id number] DOB: 1951-03-15 DOA: 12/19/2022  PCP: Diamantina Providence, FNP  Admit date: 12/19/2022 Discharge date: 12/25/2022  Admitted From: Home  Discharge disposition: Home with home health   Recommendations for Outpatient Follow-Up:   Follow up with your primary care provider in one week.  Check CBC, BMP, magnesium in the next visit Patient has been prescribed on 4 L of oxygen on discharge.  Used to be on oxygen in the past.  Could consider weaning off as outpatient.   Discharge Diagnosis:   Principal Problem:   Acute diastolic CHF (congestive heart failure) (HCC) Active Problems:   Hypertension   OSA (obstructive sleep apnea)   Hypothyroidism   Obesity (BMI 30-39.9)   Pulmonary hypertension (HCC)   Acute hypoxic respiratory failure (HCC)   Chronic hypokalemia   Hyperlipidemia   DVT (deep venous thrombosis) (HCC)   Hypoglycemia   Discharge Condition: Improved.  Diet recommendation: Low sodium, heart healthy.    Wound care: None.  Code status: Full.   History of Present Illness:   72 y.o. female with past medical history significant of CHF, hypothyroidism, folic acid deficiency, hyperlipidemia, CKD,  history of aspiration pneumonia, HTN, OSA presented to hospital with a 1 day history of lower extremity edema and shortness of breath.  Patient had initially presented to the office where she was noted to be hypoxic with pulse ox of 77% on room air.  In the ED, chest x-ray showed tiny effusions.  CTA of the chest that did not show any evidence of pulmonary embolism, 2.4 cm right thyroid nodule, small bilateral pleural effusions, interstitial densities. Patient used to be on oxygen in the past but had not been using it.  Patient was then considered for admission to hospital for further evaluation and treatment.  Hospital Course:   Following conditions were addressed during hospitalization as listed below,  Acute hypoxic  respiratory failure secondary to acute on chronic  diastolic CHF  Patient had a pulse ox of 88%  on room air on presentation and had  dyspnea and shortness of breath.   BNP of 549 on presentation., patient had minimally elevated troponins.   Patient takes Lasix 40 mg twice daily at home. Lasix received 40 mg IV twice daily in hospitalization and 1 dose of Zaroxolyn.  Patient did have significant diuresis after that.  Of note patient does have a history of noncompliance with medications as outpatient.  Reemphasized compliance.  Continue strict intake and output charting, daily weights.  2D echocardiogram on 12/20/2022 showed LV ejection fraction of 60 to 65% with no regional wall motion abnormality but grade 1 diastolic dysfunction.  Continue beta-blocker and farxiga.   PT OT recommend home health on discharge.  Patient has qualified for home oxygen on discharge and will be prescribed 4 L on discharge.  Patient used to be on the oxygen in the past but was not using it.  Hypokalemia:  Improved. Latest potassium 3.8.  Would recommend outpatient follow-up   Hypomagnesemia. Improved. Latest magnesium of 1.7.     Mild hypophosphatemia.  Improved after replacement.  Latest phosphorus of 3.0.   Acute bilateral DVT:  Venous duplex showed bilateral DVT.  On Eliquis.   CTA of the chest without PE.  Denies overt pain in the legs.   Iron deficiency anemia:  Latest hemoglobin of 8. 7.  Received 2 doses of IV Venofer.     Elevated troponins probably from demand ischemia from CHF.  Will closely  monitor.  Continue diuretics and beta-blockers.     Obesity.  Body mass index is 31.99 kg/m.Patient would benefit from weight loss as outpatient.    Hypothyroidism; TSH less than 0.01, free T41.1 and elevated, total T3 within normal range.  Hold off with Synthroid for now.  Patient takes 12.5 mcg at home.  Will resume on discharge.    Hyperlipidemia;  Continue Crestor.   Hypertension. Patient is on amlodipine,  hydralazine and metoprolol at home.  Continue on discharge.   Debility, weakness.  Physical therapy has seen the patient and recommend home health on discharge.     Disposition.  At this time, patient is stable for disposition home with outpatient PCP follow-up.  Medical Consultants:   None.  Procedures:    Note Subjective:   Today, patient was seen and examined at bedside.  Denies any nausea vomiting, fever chills or rigor.  States that her breathing has significantly improved.  Denies any chest pain.  Discharge Exam:   Vitals:   12/25/22 0746 12/25/22 0747  BP:    Pulse: 65 68  Resp: 15 20  Temp:    SpO2: 97% 98%   Vitals:   12/25/22 0524 12/25/22 0715 12/25/22 0746 12/25/22 0747  BP: 133/65 133/66    Pulse: 68 64 65 68  Resp: 20 19 15 20   Temp: 98.8 F (37.1 C) 98.9 F (37.2 C)    TempSrc: Oral Oral    SpO2: 95% 98% 97% 98%  Weight: 81.6 kg     Height:       Body mass index is 29.94 kg/m.   General: Alert awake, not in obvious distress, on nasal cannula oxygen, obese built. HENT: pupils equally reacting to light,  No scleral pallor or icterus noted. Oral mucosa is moist.  Chest:  Clear breath sounds.  No crackles or wheezes.  CVS: S1 &S2 heard. No murmur.  Regular rate and rhythm. Abdomen: Soft, nontender, nondistended.  Bowel sounds are heard.   Extremities: No cyanosis, clubbing with bilateral lower extremity edema  Psych: Alert, awake and oriented, normal mood CNS:  No cranial nerve deficits.  Power equal in all extremities.   Skin: Warm and dry.  No rashes noted.  The results of significant diagnostics from this hospitalization (including imaging, microbiology, ancillary and laboratory) are listed below for reference.     Diagnostic Studies:   ECHOCARDIOGRAM COMPLETE  Result Date: 12/20/2022    ECHOCARDIOGRAM REPORT   Patient Name:   Michaela Baker Date of Exam: 12/20/2022 Medical Rec #:  [id number]     Height:       65.0 in Accession #:    [id number]     Weight:       195.0 lb Date of Birth:  11/07/1950     BSA:          1.957 m Patient Age:    71 years      BP:           124/62 mmHg Patient Gender: F             HR:           72 bpm. Exam Location:  Inpatient Procedure: 2D Echo, Color Doppler and Cardiac Doppler Indications:    I50.21 Acute systolic (congestive) heart failure  History:        Patient has prior history of Echocardiogram examinations, most                 recent 06/02/2022. CHF;  Risk Factors:Hypertension, Diabetes,                 Dyslipidemia and Sleep Apnea.  Sonographer:    Irving Burton Senior RDCS Referring Phys: Consepcion Hearing ANASTASSIA DOUTOVA IMPRESSIONS  1. Left ventricular ejection fraction, by estimation, is 60 to 65%. Left ventricular ejection fraction by 2D MOD biplane is 60.6 %. The left ventricle has normal function. The left ventricle has no regional wall motion abnormalities. Left ventricular diastolic parameters are consistent with Grade I diastolic dysfunction (impaired relaxation).  2. Right ventricular systolic function is normal. The right ventricular size is mildly enlarged. There is severely elevated pulmonary artery systolic pressure. The estimated right ventricular systolic pressure is 61.0 mmHg.  3. The mitral valve is degenerative. Mild mitral valve regurgitation. No evidence of mitral stenosis.  4. Tricuspid valve regurgitation is mild to moderate.  5. The aortic valve is tricuspid. Aortic valve regurgitation is trivial. No aortic stenosis is present.  6. The inferior vena cava is dilated in size with <50% respiratory variability, suggesting right atrial pressure of 15 mmHg. Comparison(s): Prior images reviewed side by side. There is increase in estimated pulmonary artery pressure and mean right atrial pressure, but there appears to be no change in mean left atrial pressure. FINDINGS  Left Ventricle: Left ventricular ejection fraction, by estimation, is 60 to 65%. Left ventricular ejection fraction by 2D MOD biplane is 60.6 %. The left  ventricle has normal function. The left ventricle has no regional wall motion abnormalities. The left ventricular internal cavity size was normal in size. There is borderline concentric left ventricular hypertrophy. Left ventricular diastolic parameters are consistent with Grade I diastolic dysfunction (impaired relaxation). Normal left ventricular filling pressure. Right Ventricle: The right ventricular size is mildly enlarged. No increase in right ventricular wall thickness. Right ventricular systolic function is normal. There is severely elevated pulmonary artery systolic pressure. The tricuspid regurgitant velocity is 3.39 m/s, and with an assumed right atrial pressure of 15 mmHg, the estimated right ventricular systolic pressure is 61.0 mmHg. Left Atrium: Left atrial size was normal in size. Right Atrium: Right atrial size was normal in size. Pericardium: There is no evidence of pericardial effusion. Mitral Valve: The mitral valve is degenerative in appearance. There is mild thickening of the mitral valve leaflet(s). Mild mitral valve regurgitation. No evidence of mitral valve stenosis. Tricuspid Valve: The tricuspid valve is normal in structure. Tricuspid valve regurgitation is mild to moderate. No evidence of tricuspid stenosis. Aortic Valve: The aortic valve is tricuspid. Aortic valve regurgitation is trivial. No aortic stenosis is present. Pulmonic Valve: The pulmonic valve was normal in structure. Pulmonic valve regurgitation is trivial. No evidence of pulmonic stenosis. Aorta: The aortic root and ascending aorta are structurally normal, with no evidence of dilitation. Venous: The inferior vena cava is dilated in size with less than 50% respiratory variability, suggesting right atrial pressure of 15 mmHg. IAS/Shunts: No atrial level shunt detected by color flow Doppler.  LEFT VENTRICLE PLAX 2D                        Biplane EF (MOD) LVIDd:         4.70 cm         LV Biplane EF:   Left LVIDs:         3.55  cm                          ventricular LV  PW:         1.00 cm                          ejection LV IVS:        1.00 cm                          fraction by LVOT diam:     2.20 cm                          2D MOD LV SV:         92                               biplane is LV SV Index:   47                               60.6 %. LVOT Area:     3.80 cm                                Diastology                                LV e' medial:    6.85 cm/s LV Volumes (MOD)               LV E/e' medial:  9.5 LV vol d, MOD    87.2 ml       LV e' lateral:   7.83 cm/s A2C:                           LV E/e' lateral: 8.3 LV vol d, MOD    92.3 ml A4C: LV vol s, MOD    33.3 ml A2C: LV vol s, MOD    36.3 ml A4C: LV SV MOD A2C:   53.9 ml LV SV MOD A4C:   92.3 ml LV SV MOD BP:    54.6 ml RIGHT VENTRICLE RV S prime:     13.10 cm/s TAPSE (M-mode): 2.1 cm LEFT ATRIUM             Index        RIGHT ATRIUM           Index LA diam:        3.70 cm 1.89 cm/m   RA Area:     13.60 cm LA Vol (A2C):   72.9 ml 37.25 ml/m  RA Volume:   27.50 ml  14.05 ml/m LA Vol (A4C):   57.4 ml 29.33 ml/m LA Biplane Vol: 65.3 ml 33.37 ml/m  AORTIC VALVE LVOT Vmax:   112.00 cm/s LVOT Vmean:  80.800 cm/s LVOT VTI:    0.243 m  AORTA Ao Root diam: 3.10 cm Ao Asc diam:  3.60 cm MITRAL VALVE               TRICUSPID VALVE MV Area (PHT): 2.65 cm    TR Peak grad:   46.0 mmHg MV E velocity: 65.10 cm/s  TR Vmax:        339.00 cm/s MV A velocity: 82.70 cm/s MV E/A ratio:  0.79        SHUNTS                            Systemic VTI:  0.24 m                            Systemic Diam: 2.20 cm Rachelle Hora Croitoru MD Electronically signed by Thurmon Fair MD Signature Date/Time: 12/20/2022/10:57:48 AM    Final    VAS [place] LOWER EXTREMITY VENOUS (DVT)  Result Date: 12/19/2022  Lower Venous DVT Study Patient Name:  Michaela Baker [facility]  Date of Exam:   12/19/2022 Medical Rec #: [id number]      Accession #:    [id number] Date of Birth: 05-May-1951      Patient Gender: F Patient Age:   41 years  Exam Location:  [facility] Procedure:      VAS [place] LOWER EXTREMITY VENOUS (DVT) Referring Phys: Nehemiah Settle SMALL --------------------------------------------------------------------------------  Indications: Swelling, and SOB.  Risk Factors: History of right LE reflux disease. S/P right GSV ablation 01/26/22. Limitations: Body habitus and edema. Comparison Study: Prior negative right LEV Performing Technologist: Sherren Kerns RVS  Examination Guidelines: A complete evaluation includes B-mode imaging, spectral Doppler, color Doppler, and power Doppler as needed of all accessible portions of each vessel. Bilateral testing is considered an integral part of a complete examination. Limited examinations for reoccurring indications may be performed as noted. The reflux portion of the exam is performed with the patient in reverse Trendelenburg.  +---------+---------------+---------+-----------+--------------+---------------+ RIGHT    CompressibilityPhasicitySpontaneityProperties    Thrombus Aging  +---------+---------------+---------+-----------+--------------+---------------+ CFV      Partial                            pulsatile flowAge                                                                       Indeterminate   +---------+---------------+---------+-----------+--------------+---------------+ FV Prox  Full                                                             +---------+---------------+---------+-----------+--------------+---------------+ FV Mid   Full                                                             +---------+---------------+---------+-----------+--------------+---------------+ FV Distal                                   pulsatile flowpatent by color  and Doppler     +---------+---------------+---------+-----------+--------------+---------------+ PFV                                                        Not well                                                                  visualized      +---------+---------------+---------+-----------+--------------+---------------+ POP      Partial        No       No                       Age                                                                       Indeterminate   +---------+---------------+---------+-----------+--------------+---------------+ PTV                                                       Not well                                                                  visualized      +---------+---------------+---------+-----------+--------------+---------------+ PERO                                                      Not well                                                                  visualized      +---------+---------------+---------+-----------+--------------+---------------+   +----------+---------------+---------+-----------+----------+-----------------+ LEFT      CompressibilityPhasicitySpontaneityPropertiesThrombus Aging    +----------+---------------+---------+-----------+----------+-----------------+ CFV       None           No       No                   Age Indeterminate +----------+---------------+---------+-----------+----------+-----------------+ SFJ       Full                                                           +----------+---------------+---------+-----------+----------+-----------------+  FV Prox   None                                         Age Indeterminate +----------+---------------+---------+-----------+----------+-----------------+ FV Mid    None                                         Age Indeterminate +----------+---------------+---------+-----------+----------+-----------------+ FV Distal None                                         Age Indeterminate  +----------+---------------+---------+-----------+----------+-----------------+ PFV       Full                                                           +----------+---------------+---------+-----------+----------+-----------------+ POP       None           No       No                   Age Indeterminate +----------+---------------+---------+-----------+----------+-----------------+ PTV       None                                         Acute             +----------+---------------+---------+-----------+----------+-----------------+ PERO                                                   Not well                                                                 visualized        +----------+---------------+---------+-----------+----------+-----------------+ EIV                                                    patent            +----------+---------------+---------+-----------+----------+-----------------+ Distal CIV                                             patent            +----------+---------------+---------+-----------+----------+-----------------+     Summary: RIGHT: - Findings consistent with age indeterminate deep vein thrombosis involving the right common femoral vein, and right popliteal vein. - No cystic structure found  in the popliteal fossa.  LEFT: - Findings consistent with age indeterminate deep vein thrombosis involving the left common femoral vein, left femoral vein, left popliteal vein, and left posterior tibial veins. - No cystic structure found in the popliteal fossa.  *See table(s) above for measurements and observations. Electronically signed by Lemar Livings MD on 12/19/2022 at 7:04:33 PM.    Final    CT Angio Chest PE W and/or Wo Contrast  Result Date: 12/19/2022 CLINICAL DATA:  Positive D-dimer. EXAM: CT ANGIOGRAPHY CHEST WITH CONTRAST TECHNIQUE: Multidetector CT imaging of the chest was performed using the standard protocol during bolus  administration of intravenous contrast. Multiplanar CT image reconstructions and MIPs were obtained to evaluate the vascular anatomy. RADIATION DOSE REDUCTION: This exam was performed according to the departmental dose-optimization program which includes automated exposure control, adjustment of the mA and/or kV according to patient size and/or use of iterative reconstruction technique. CONTRAST:  75mL OMNIPAQUE IOHEXOL 350 MG/ML SOLN COMPARISON:  November 09, 2022. FINDINGS: Cardiovascular: Satisfactory opacification of the pulmonary arteries to the segmental level. No evidence of pulmonary embolism. Mild cardiomegaly. No pericardial effusion. Coronary artery calcifications are noted. Mediastinum/Nodes: 2.4 cm right thyroid nodule is noted. No adenopathy is noted. The esophagus is unremarkable. Lungs/Pleura: Small bilateral pleural effusions are noted with adjacent subsegmental atelectasis. Interstitial densities are noted throughout both lungs concerning for edema or possibly atypical pneumonia. Cavitary lesion noted in right upper lobe on prior exam is significantly smaller currently, measuring 3.5 x 2.5 cm. Upper Abdomen: No acute abnormality. Musculoskeletal: No chest wall abnormality. No acute or significant osseous findings. Review of the MIP images confirms the above findings. IMPRESSION: No definite evidence of pulmonary embolus. 2.4 cm right thyroid nodule is noted. Recommend thyroid US. (Ref: J Am Coll Radiol. 2015 Feb;12(2): 143-50). Small bilateral pleural effusions are noted with adjacent subsegmental atelectasis. Interstitial densities are noted throughout both lungs concerning for edema or possibly atypical inflammation. Cavitary lesion seen in right upper lobe on prior exam is significantly smaller currently, consistent with improving cavitary pneumonia. Coronary artery calcifications are noted. Aortic Atherosclerosis (ICD10-I70.0) and Emphysema (ICD10-J43.9). Electronically Signed   By: Lupita Raider  M.D.   On: 12/19/2022 18:00   DG Chest 2 View  Result Date: 12/19/2022 CLINICAL DATA:  Hypoxia.  CHF and leg swelling EXAM: CHEST - 2 VIEW COMPARISON:  Chest x-ray 11/09/2022 FINDINGS: Calcified aorta. Normal cardiopericardial silhouette. Interstitial changes are noted. Some subtle patchy opacity in the right mid to lower lung as well. Infiltrates possible. Tiny pleural effusions. No pneumothorax. Overlapping cardiac leads. IMPRESSION: Tiny effusions. Interstitial changes noted bilaterally with some more focal opacity in the right mid to lower lung. Infiltrates possible. Recommend short follow-up. Electronically Signed   By: Karen Kays M.D.   On: 12/19/2022 14:21     Labs:   Basic Metabolic Panel: Recent Labs  Lab 12/19/22 2333 12/20/22 0900 12/20/22 1456 12/21/22 0956 12/22/22 0132 12/23/22 0125 12/25/22 0225  NA 139 140 141 140 139 139 135  K 2.5* 3.2* 3.2* 3.0* 3.8 3.8 3.8  CL 108 111 109 108 108 104 100  CO2 22 21* 24 21* 25 26 28   GLUCOSE 99 81 93 112* 84 90 86  BUN 11 11 9 10 12 15 20   CREATININE 1.18* 1.13* 1.38* 1.34* 1.28* 1.30* 1.63*  CALCIUM 8.0* 7.8* 8.1* 8.2* 7.8* 8.3* 8.6*  MG 1.6* 2.0 1.9  --  1.7 1.9 1.7  PHOS 2.5  --  2.4* 3.4  --  3.0  --  GFR Estimated Creatinine Clearance: 32.9 mL/min (A) (by C-G formula based on SCr of 1.63 mg/dL (H)). Liver Function Tests: Recent Labs  Lab 12/19/22 1258 12/20/22 1456  AST 26 19  ALT 11 10  ALKPHOS 45 46  BILITOT 0.5 0.5  PROT 7.0 7.2  ALBUMIN 2.2* 2.1*   No results for input(s): "LIPASE", "AMYLASE" in the last 168 hours. No results for input(s): "AMMONIA" in the last 168 hours. Coagulation profile No results for input(s): "INR", "PROTIME" in the last 168 hours.  CBC: Recent Labs  Lab 12/19/22 1258 12/20/22 1456 12/21/22 0956 12/22/22 0132 12/23/22 0125 12/25/22 0225  WBC 4.4 5.4 3.5* 4.2 5.9 5.2  NEUTROABS 2.8  --   --   --   --   --   HGB 9.0* 8.9* 9.2* 8.3* 8.7* 8.7*  HCT 30.5* 30.9* 31.8*  27.8* 29.6* 29.7*  MCV 91.9 88.3 90.9 88.8 87.6 88.1  PLT 228 241 238 194 218 226   Cardiac Enzymes: No results for input(s): "CKTOTAL", "CKMB", "CKMBINDEX", "TROPONINI" in the last 168 hours. BNP: Invalid input(s): "POCBNP" CBG: Recent Labs  Lab 12/19/22 2159 12/20/22 0014 12/20/22 0341 12/20/22 0808 12/20/22 0940  GLUCAP 91 97 80 70 79   D-Dimer No results for input(s): "DDIMER" in the last 72 hours. Hgb A1c No results for input(s): "HGBA1C" in the last 72 hours. Lipid Profile No results for input(s): "CHOL", "HDL", "LDLCALC", "TRIG", "CHOLHDL", "LDLDIRECT" in the last 72 hours. Thyroid function studies No results for input(s): "TSH", "T4TOTAL", "T3FREE", "THYROIDAB" in the last 72 hours.  Invalid input(s): "FREET3" Anemia work up No results for input(s): "VITAMINB12", "FOLATE", "FERRITIN", "TIBC", "IRON", "RETICCTPCT" in the last 72 hours. Microbiology No results found for this or any previous visit (from the past 240 hour(s)).   Discharge Instructions:   Discharge Instructions     (HEART FAILURE PATIENTS) Call MD:  Anytime you have any of the following symptoms: 1) 3 pound weight gain in 24 hours or 5 pounds in 1 week 2) shortness of breath, with or without a dry hacking cough 3) swelling in the hands, feet or stomach 4) if you have to sleep on extra pillows at night in order to breathe.   Complete by: As directed    Avoid straining   Complete by: As directed    Diet - low sodium heart healthy   Complete by: As directed    Discharge instructions   Complete by: As directed    Follow up with your primary care provider in one week. Check blood work at that time. Seek medical attention for worsening symptoms. Please take all medications prescribed. Take precautions while on blood thinner.   Heart Failure patients record your daily weight using the same scale at the same time of day   Complete by: As directed    Increase activity slowly   Complete by: As directed     STOP any activity that causes chest pain, shortness of breath, dizziness, sweating, or exessive weakness   Complete by: As directed       Allergies as of 12/25/2022   No Known Allergies      Medication List     TAKE these medications    acetaminophen 500 MG tablet Commonly known as: TYLENOL Take 1,000 mg by mouth as needed (pain).   amLODipine 10 MG tablet Commonly known as: NORVASC Take 10 mg by mouth daily.   apixaban 5 MG Tabs tablet Commonly known as: ELIQUIS Take 2 tablets (10 mg total) by  mouth 2 (two) times daily for 3 days, THEN 1 tablet (5 mg total) 2 (two) times daily. Start taking on: December 25, 2022   aspirin EC 81 MG tablet Take 1 tablet (81 mg total) by mouth daily.   dapagliflozin propanediol 10 MG Tabs tablet Commonly known as: FARXIGA Take 10 mg by mouth daily.   folic acid 1 MG tablet Commonly known as: FOLVITE Take 1 tablet (1 mg total) by mouth daily.   furosemide 40 MG tablet Commonly known as: LASIX Take 40 mg by mouth 2 (two) times daily.   hydrALAZINE 50 MG tablet Commonly known as: APRESOLINE Take 50 mg by mouth daily.   Kerendia 10 MG Tabs Generic drug: Finerenone take 1 tablet by mouth daily for chronic kidney disease   levothyroxine 25 MCG tablet Commonly known as: SYNTHROID Take 12.5 mcg by mouth daily.   metoprolol succinate 50 MG 24 hr tablet Commonly known as: TOPROL-XL Take 50 mg by mouth daily. Take with or immediately following a meal.   multivitamin with minerals tablet Take 2 tablets by mouth daily.   potassium chloride SA 20 MEQ tablet Commonly known as: KLOR-CON M Take 1 tablet (20 mEq total) by mouth daily.   rosuvastatin 5 MG tablet Commonly known as: CRESTOR Take 5 mg by mouth daily.               Durable Medical Equipment  (From admission, onward)           Start     Ordered   12/25/22 1100  For home use only DME oxygen  Once       Question Answer Comment  Length of Need 6 Months   Mode or  (Route) Nasal cannula   Liters per Minute 4   Frequency Continuous (stationary and portable oxygen unit needed)   Oxygen conserving device Yes   Oxygen delivery system Gas      12/25/22 1059   12/21/22 1650  For home use only DME 4 wheeled rolling walker with seat  Once       Question:  Patient needs a walker to treat with the following condition  Answer:  Weakness   12/21/22 1649            Follow-up Information     [employer] Follow up.   Why: rollator will be delivered to room on 7/25 Contact information: [phone]                 Time coordinating discharge: 39 minutes  Signed:  Amayra Kiedrowski  [facility] 12/25/2022, 4:21 PM

## 2022-12-25 NOTE — Progress Notes (Signed)
SATURATION QUALIFICATIONS: (This note is used to comply with regulatory documentation for home oxygen)  Patient Saturations on Room Air at Rest = 86%  Patient Saturations on 3 Liters of oxygen while at rest= 92%  Patient Saturations on 6 Liters of oxygen while Ambulating = 93%  Please briefly explain why patient needs home oxygen: Saturations in 80s on room air at rest. Patient requires increased oxygen demand while ambulating.

## 2022-12-25 NOTE — Plan of Care (Signed)
  Problem: Nutrition: Goal: Adequate nutrition will be maintained Outcome: Completed/Met   Problem: Elimination: Goal: Will not experience complications related to urinary retention Outcome: Completed/Met   Problem: Pain Managment: Goal: General experience of comfort will improve Outcome: Completed/Met

## 2022-12-25 NOTE — TOC Transition Note (Signed)
Transition of Care [facility]) - CM/SW Discharge Note   Patient Details  Name: Michaela Baker MRN: [id number] Date of Birth: 25-May-1951  Transition of Care [facility]) CM/SW Contact:  Lawerance Sabal, RN Phone Number: 12/25/2022, 11:05 AM   Clinical Narrative:     Notified HH agency that patient will DC today.  Requested Amb sats.  Home oxygen to be delivered to patient's room through [facility] prior to DC   Final next level of care: Home w Home Health Services     Patient Goals and CMS Choice CMS Medicare.gov Compare Post Acute Care list provided to:: Patient Choice offered to / list presented to : Patient, Adult Children  Discharge Placement                         Discharge Plan and Services Additional resources added to the After Visit Summary for   In-house Referral: NA Discharge Planning Services: CM Consult Post Acute Care Choice: Home Health, Resumption of Svcs/PTA Provider          DME Arranged: Oxygen DME Agency: [employer] Date DME Agency Contacted: 12/25/22 Time DME Agency Contacted: 1105 Representative spoke with at DME Agency: Vaughan Basta HH Arranged: PT, OT HH Agency: [facility] Date [facility] Agency Contacted: 12/21/22 Time HH Agency Contacted: 1643 Representative spoke with at [facility] Agency: Haywood Lasso  Social Determinants of Health (SDOH) Interventions SDOH Screenings   Food Insecurity: No Food Insecurity (12/20/2022)  Housing: Low Risk  (12/20/2022)  Transportation Needs: No Transportation Needs (12/20/2022)  Utilities: Not At Risk (12/20/2022)  Social Connections: Unknown (10/08/2021)   Received from [facility]  Tobacco Use: Low Risk  (12/19/2022)     Readmission Risk Interventions    11/10/2022   11:07 AM 06/03/2022    4:43 PM  Readmission Risk Prevention Plan  Transportation Screening Complete Complete  PCP or Specialist Appt within 5-7 Days  Complete  PCP or Specialist Appt within 3-5 Days Complete   Home Care Screening  Complete  Medication  Review (RN CM)  Complete  HRI or Home Care Consult Complete   Social Work Consult for Recovery Care Planning/Counseling Complete   Palliative Care Screening Not Applicable   Medication Review [employer]) Complete

## 2022-12-28 ENCOUNTER — Ambulatory Visit: Payer: 59 | Admitting: Internal Medicine

## 2023-02-02 ENCOUNTER — Ambulatory Visit
Admission: RE | Admit: 2023-02-02 | Discharge: 2023-02-02 | Disposition: A | Discharge status: Home / Self Care | Payer: 59 | Source: Ambulatory Visit | Attending: Nurse Practitioner | Admitting: Nurse Practitioner

## 2023-02-02 DIAGNOSIS — Z1231 Encounter for screening mammogram for malignant neoplasm of breast: Secondary | ICD-10-CM

## 2023-02-09 ENCOUNTER — Encounter: Payer: Self-pay | Admitting: Podiatry

## 2023-02-09 ENCOUNTER — Ambulatory Visit (INDEPENDENT_AMBULATORY_CARE_PROVIDER_SITE_OTHER): Payer: 59 | Admitting: Podiatry

## 2023-02-09 DIAGNOSIS — L97411 Non-pressure chronic ulcer of right heel and midfoot limited to breakdown of skin: Secondary | ICD-10-CM | POA: Diagnosis not present

## 2023-02-12 NOTE — Progress Notes (Signed)
She presents today for follow-up of her ulceration to the plantar heel of her right foot states that there was a blood blister and it popped she says it looks like a ulcer but I want to look at it.  She states that it did hurt but it no longer does.  Objective: Vital signs stable alert oriented x 3 right heel does demonstrate a superficial ulcerative lesion does not break through the dermis only to the dermal epidermal junction.  No signs of infection.  Assessment: Ulcerative lesion plantar aspect right heel.  Plan: Debrided that area today I will follow-up with her on an as-needed basis she knows how to take care of this she will continue to dress it daily until it has completely healed.  If there are any signs of infection or she is that it starts to experience more pain she will notify us immediately.

## 2023-02-23 ENCOUNTER — Telehealth: Payer: Self-pay | Admitting: Podiatry

## 2023-02-23 ENCOUNTER — Other Ambulatory Visit: Payer: Self-pay | Admitting: Podiatry

## 2023-02-23 NOTE — Telephone Encounter (Signed)
Pharmacy called and for auditing purposes, asked how long the mupirocin ointment is needed for.  Stated at least 3 weeks.    No further action needed.

## 2023-02-24 ENCOUNTER — Emergency Department (HOSPITAL_COMMUNITY): Payer: 59

## 2023-02-24 ENCOUNTER — Inpatient Hospital Stay (HOSPITAL_COMMUNITY)
Admission: EM | Admit: 2023-02-24 | Discharge: 2023-02-26 | DRG: 871 | Disposition: A | Discharge status: Home / Self Care | Payer: 59 | Attending: Internal Medicine | Admitting: Internal Medicine

## 2023-02-24 ENCOUNTER — Encounter (HOSPITAL_COMMUNITY): Payer: Self-pay | Admitting: Internal Medicine

## 2023-02-24 ENCOUNTER — Other Ambulatory Visit: Payer: Self-pay

## 2023-02-24 DIAGNOSIS — L03116 Cellulitis of left lower limb: Secondary | ICD-10-CM | POA: Diagnosis present

## 2023-02-24 DIAGNOSIS — Z833 Family history of diabetes mellitus: Secondary | ICD-10-CM | POA: Diagnosis not present

## 2023-02-24 DIAGNOSIS — Z7982 Long term (current) use of aspirin: Secondary | ICD-10-CM | POA: Diagnosis not present

## 2023-02-24 DIAGNOSIS — Z79899 Other long term (current) drug therapy: Secondary | ICD-10-CM

## 2023-02-24 DIAGNOSIS — I130 Hypertensive heart and chronic kidney disease with heart failure and stage 1 through stage 4 chronic kidney disease, or unspecified chronic kidney disease: Secondary | ICD-10-CM | POA: Diagnosis present

## 2023-02-24 DIAGNOSIS — E669 Obesity, unspecified: Secondary | ICD-10-CM | POA: Diagnosis present

## 2023-02-24 DIAGNOSIS — Z8249 Family history of ischemic heart disease and other diseases of the circulatory system: Secondary | ICD-10-CM | POA: Diagnosis not present

## 2023-02-24 DIAGNOSIS — Z86718 Personal history of other venous thrombosis and embolism: Secondary | ICD-10-CM

## 2023-02-24 DIAGNOSIS — L03115 Cellulitis of right lower limb: Secondary | ICD-10-CM | POA: Diagnosis present

## 2023-02-24 DIAGNOSIS — N1832 Chronic kidney disease, stage 3b: Secondary | ICD-10-CM | POA: Diagnosis present

## 2023-02-24 DIAGNOSIS — Z841 Family history of disorders of kidney and ureter: Secondary | ICD-10-CM

## 2023-02-24 DIAGNOSIS — N179 Acute kidney failure, unspecified: Secondary | ICD-10-CM | POA: Diagnosis present

## 2023-02-24 DIAGNOSIS — E785 Hyperlipidemia, unspecified: Secondary | ICD-10-CM | POA: Diagnosis present

## 2023-02-24 DIAGNOSIS — L97411 Non-pressure chronic ulcer of right heel and midfoot limited to breakdown of skin: Secondary | ICD-10-CM | POA: Diagnosis present

## 2023-02-24 DIAGNOSIS — I10 Essential (primary) hypertension: Secondary | ICD-10-CM | POA: Diagnosis not present

## 2023-02-24 DIAGNOSIS — A419 Sepsis, unspecified organism: Principal | ICD-10-CM | POA: Diagnosis present

## 2023-02-24 DIAGNOSIS — Z7901 Long term (current) use of anticoagulants: Secondary | ICD-10-CM | POA: Diagnosis not present

## 2023-02-24 DIAGNOSIS — R6520 Severe sepsis without septic shock: Secondary | ICD-10-CM | POA: Diagnosis present

## 2023-02-24 DIAGNOSIS — G9341 Metabolic encephalopathy: Secondary | ICD-10-CM | POA: Insufficient documentation

## 2023-02-24 DIAGNOSIS — I5032 Chronic diastolic (congestive) heart failure: Secondary | ICD-10-CM | POA: Diagnosis present

## 2023-02-24 DIAGNOSIS — E119 Type 2 diabetes mellitus without complications: Secondary | ICD-10-CM

## 2023-02-24 DIAGNOSIS — E039 Hypothyroidism, unspecified: Secondary | ICD-10-CM | POA: Diagnosis present

## 2023-02-24 DIAGNOSIS — Z79890 Hormone replacement therapy (postmenopausal): Secondary | ICD-10-CM

## 2023-02-24 DIAGNOSIS — N189 Chronic kidney disease, unspecified: Secondary | ICD-10-CM | POA: Diagnosis not present

## 2023-02-24 DIAGNOSIS — L97511 Non-pressure chronic ulcer of other part of right foot limited to breakdown of skin: Secondary | ICD-10-CM

## 2023-02-24 DIAGNOSIS — E872 Acidosis, unspecified: Secondary | ICD-10-CM | POA: Diagnosis present

## 2023-02-24 LAB — CBC WITH DIFFERENTIAL/PLATELET
Abs Immature Granulocytes: 0.07 10*3/uL (ref 0.00–0.07)
Basophils Absolute: 0 10*3/uL (ref 0.0–0.1)
Basophils Relative: 0 %
Eosinophils Absolute: 0 10*3/uL (ref 0.0–0.5)
Eosinophils Relative: 0 %
HCT: 34.7 % — ABNORMAL LOW (ref 36.0–46.0)
Hemoglobin: 10.7 g/dL — ABNORMAL LOW (ref 12.0–15.0)
Immature Granulocytes: 1 %
Lymphocytes Relative: 6 %
Lymphs Abs: 0.5 10*3/uL — ABNORMAL LOW (ref 0.7–4.0)
MCH: 26.2 pg (ref 26.0–34.0)
MCHC: 30.8 g/dL (ref 30.0–36.0)
MCV: 84.8 fL (ref 80.0–100.0)
Monocytes Absolute: 0.5 10*3/uL (ref 0.1–1.0)
Monocytes Relative: 5 %
Neutro Abs: 8.3 10*3/uL — ABNORMAL HIGH (ref 1.7–7.7)
Neutrophils Relative %: 88 %
Platelets: 125 10*3/uL — ABNORMAL LOW (ref 150–400)
RBC: 4.09 MIL/uL (ref 3.87–5.11)
RDW: 14.9 % (ref 11.5–15.5)
WBC: 9.5 10*3/uL (ref 4.0–10.5)
nRBC: 0 % (ref 0.0–0.2)

## 2023-02-24 LAB — COMPREHENSIVE METABOLIC PANEL
ALT: 14 U/L (ref 0–44)
AST: 24 U/L (ref 15–41)
Albumin: 3.3 g/dL — ABNORMAL LOW (ref 3.5–5.0)
Alkaline Phosphatase: 47 U/L (ref 38–126)
Anion gap: 11 (ref 5–15)
BUN: 47 mg/dL — ABNORMAL HIGH (ref 8–23)
CO2: 19 mmol/L — ABNORMAL LOW (ref 22–32)
Calcium: 8.8 mg/dL — ABNORMAL LOW (ref 8.9–10.3)
Chloride: 108 mmol/L (ref 98–111)
Creatinine, Ser: 2.6 mg/dL — ABNORMAL HIGH (ref 0.44–1.00)
GFR, Estimated: 19 mL/min — ABNORMAL LOW (ref >60)
Glucose, Bld: 130 mg/dL — ABNORMAL HIGH (ref 70–99)
Potassium: 3 mmol/L — ABNORMAL LOW (ref 3.5–5.1)
Sodium: 138 mmol/L (ref 135–145)
Total Bilirubin: 0.6 mg/dL (ref 0.3–1.2)
Total Protein: 8.3 g/dL — ABNORMAL HIGH (ref 6.5–8.1)

## 2023-02-24 LAB — PROTIME-INR
INR: 1.3 — ABNORMAL HIGH (ref 0.8–1.2)
Prothrombin Time: 16.4 s — ABNORMAL HIGH (ref 11.4–15.2)

## 2023-02-24 LAB — APTT: aPTT: 42 s — ABNORMAL HIGH (ref 24–36)

## 2023-02-24 LAB — I-STAT CG4 LACTIC ACID, ED
Lactic Acid, Venous: 2.1 mmol/L (ref 0.5–1.9)
Lactic Acid, Venous: 2 mmol/L (ref 0.5–1.9)

## 2023-02-24 MED ORDER — ROSUVASTATIN CALCIUM 5 MG PO TABS
5.0000 mg | ORAL_TABLET | Freq: Every day | ORAL | Status: DC
  Administered 2023-02-24 – 2023-02-25 (×2): 5 mg via ORAL
  Filled 2023-02-24 (×2): qty 1

## 2023-02-24 MED ORDER — LACTATED RINGERS IV BOLUS (SEPSIS)
500.0000 mL | Freq: Once | INTRAVENOUS | Status: AC
  Administered 2023-02-24: 500 mL via INTRAVENOUS

## 2023-02-24 MED ORDER — SODIUM CHLORIDE 0.9 % IV SOLN
2.0000 g | Freq: Once | INTRAVENOUS | Status: AC
  Administered 2023-02-24: 2 g via INTRAVENOUS
  Filled 2023-02-24: qty 12.5

## 2023-02-24 MED ORDER — SODIUM CHLORIDE 0.9 % IV SOLN: INTRAVENOUS | Status: AC

## 2023-02-24 MED ORDER — SENNA 8.6 MG PO TABS
1.0000 | ORAL_TABLET | Freq: Two times a day (BID) | ORAL | Status: AC
  Administered 2023-02-24 – 2023-02-25 (×2): 8.6 mg via ORAL
  Filled 2023-02-24 (×3): qty 1

## 2023-02-24 MED ORDER — POTASSIUM CHLORIDE CRYS ER 20 MEQ PO TBCR
20.0000 meq | EXTENDED_RELEASE_TABLET | Freq: Two times a day (BID) | ORAL | Status: DC
  Administered 2023-02-24: 20 meq via ORAL
  Filled 2023-02-24: qty 1

## 2023-02-24 MED ORDER — ACETAMINOPHEN 325 MG PO TABS
650.0000 mg | ORAL_TABLET | Freq: Four times a day (QID) | ORAL | Status: DC | PRN
  Administered 2023-02-24 – 2023-02-26 (×3): 650 mg via ORAL
  Filled 2023-02-24 (×3): qty 2

## 2023-02-24 MED ORDER — VANCOMYCIN HCL 1500 MG/300ML IV SOLN
1500.0000 mg | Freq: Once | INTRAVENOUS | Status: AC
  Administered 2023-02-24: 1500 mg via INTRAVENOUS
  Filled 2023-02-24: qty 300

## 2023-02-24 MED ORDER — VANCOMYCIN HCL IN DEXTROSE 1-5 GM/200ML-% IV SOLN: 1000.0000 mg | Freq: Once | INTRAVENOUS | Status: DC

## 2023-02-24 MED ORDER — ACETAMINOPHEN 650 MG RE SUPP: 650.0000 mg | Freq: Four times a day (QID) | RECTAL | Status: DC | PRN

## 2023-02-24 MED ORDER — SODIUM CHLORIDE 0.9 % IV SOLN
1.0000 g | INTRAVENOUS | Status: DC
  Administered 2023-02-25: 1 g via INTRAVENOUS
  Filled 2023-02-24 (×2): qty 10

## 2023-02-24 MED ORDER — LEVOTHYROXINE SODIUM 25 MCG PO TABS
12.5000 ug | ORAL_TABLET | Freq: Every day | ORAL | Status: DC
  Administered 2023-02-25 – 2023-02-26 (×2): 12.5 ug via ORAL
  Filled 2023-02-24 (×2): qty 1

## 2023-02-24 MED ORDER — APIXABAN 5 MG PO TABS
5.0000 mg | ORAL_TABLET | Freq: Two times a day (BID) | ORAL | Status: DC
  Administered 2023-02-24 – 2023-02-26 (×5): 5 mg via ORAL
  Filled 2023-02-24 (×5): qty 1

## 2023-02-24 MED ORDER — METRONIDAZOLE 500 MG/100ML IV SOLN
500.0000 mg | Freq: Once | INTRAVENOUS | Status: AC
  Administered 2023-02-24: 500 mg via INTRAVENOUS
  Filled 2023-02-24: qty 100

## 2023-02-24 MED ORDER — ASPIRIN 81 MG PO TBEC
81.0000 mg | DELAYED_RELEASE_TABLET | Freq: Every day | ORAL | Status: DC
  Administered 2023-02-24 – 2023-02-26 (×3): 81 mg via ORAL
  Filled 2023-02-24 (×3): qty 1

## 2023-02-24 MED ORDER — ONDANSETRON HCL 4 MG PO TABS: 4.0000 mg | ORAL_TABLET | Freq: Four times a day (QID) | ORAL | Status: DC | PRN

## 2023-02-24 MED ORDER — ONDANSETRON HCL 4 MG/2ML IJ SOLN: 4.0000 mg | Freq: Four times a day (QID) | INTRAMUSCULAR | Status: DC | PRN

## 2023-02-24 MED ORDER — APIXABAN 5 MG PO TABS: 5.0000 mg | ORAL_TABLET | Freq: Two times a day (BID) | ORAL | Status: DC

## 2023-02-24 MED ORDER — LACTATED RINGERS IV SOLN: INTRAVENOUS | Status: AC

## 2023-02-24 MED ORDER — DOXYCYCLINE HYCLATE 100 MG PO TABS
100.0000 mg | ORAL_TABLET | Freq: Two times a day (BID) | ORAL | Status: DC
  Administered 2023-02-24 – 2023-02-26 (×4): 100 mg via ORAL
  Filled 2023-02-24 (×4): qty 1

## 2023-02-24 MED ORDER — METOPROLOL SUCCINATE ER 50 MG PO TB24
50.0000 mg | ORAL_TABLET | Freq: Every day | ORAL | Status: DC
  Administered 2023-02-25 – 2023-02-26 (×2): 50 mg via ORAL
  Filled 2023-02-24 (×2): qty 1

## 2023-02-24 NOTE — ED Notes (Signed)
ED TO INPATIENT HANDOFF REPORT  Name/Age/Gender Michaela Baker 72 y.o. female  Code Status Code Status History     Date Active Date Inactive Code Status Order ID Comments User Context   12/19/2022 2132 12/25/2022 1838 Full Code [id number]  Therisa Doyne, MD ED   11/09/2022 1836 11/13/2022 1851 Full Code [id number]  Verdene Lennert, MD ED   06/01/2022 1106 06/04/2022 1929 Full Code [id number]  Merrilyn Puma, MD ED   12/27/2021 2020 12/31/2021 1831 Full Code [id number]  Alba Cory, MD Inpatient   03/03/2021 0815 03/16/2021 1749 Full Code [id number]  Bobette Mo, MD ED   03/29/2017 1238 03/30/2017 1418 Full Code [id number]  Pearson Grippe, MD Inpatient   07/22/2013 0101 07/26/2013 1545 Full Code [id number]  Eduard Clos, MD Inpatient    Questions for Most Recent Historical Code Status (Order [id number])     Question Answer   By: Consent: discussion documented in EHR            Home/SNF/Other Home  Chief Complaint Cellulitis of left foot [L03.116]  Level of Care/Admitting Diagnosis ED Disposition     ED Disposition  Admit   Condition  --   Comment  Hospital Area: [facility] [[id number]]  Level of Care: Med-Surg [16]  May admit patient to [facility] or [facility] if equivalent level of care is available:: No  Covid Evaluation: Asymptomatic - no recent exposure (last 10 days) testing not required  Diagnosis: Cellulitis of left foot [756433]  Admitting Physician: Marinda Elk [[id number]]  Attending Physician: Marinda Elk [[id number]]  Certification:: I certify this patient will need inpatient services for at least 2 midnights  Expected Medical Readiness: 02/28/2023          Medical History Past Medical History:  Diagnosis Date   CHF (congestive heart failure) (HCC)    Hypertension    Hypothyroidism 03/29/2017   Obesity (BMI 30-39.9)    Premature ventricular contractions     Allergies No Known Allergies  IV  Location/Drains/Wounds Patient Lines/Drains/Airways Status     Active Line/Drains/Airways     Name Placement date Placement time Site Days   Peripheral IV 02/24/23 20 G Anterior;Left Forearm 02/24/23  1125  Forearm  less than 1   Pressure Injury 11/10/22 Perineum Mid Stage 2 -  Partial thickness loss of dermis presenting as a shallow open injury with a red, pink wound bed without slough. MASD 11/10/22  0336  -- 106   Wound / Incision (Open or Dehisced) 02/24/23 Diabetic ulcer Heel Right 02/24/23  1158  Heel  less than 1            Labs/Imaging Results for orders placed or performed during the hospital encounter of 02/24/23 (from the past 48 hour(s))  Comprehensive metabolic panel     Status: Abnormal   Collection Time: 02/24/23 11:55 AM  Result Value Ref Range   Sodium 138 135 - 145 mmol/L   Potassium 3.0 (L) 3.5 - 5.1 mmol/L   Chloride 108 98 - 111 mmol/L   CO2 19 (L) 22 - 32 mmol/L   Glucose, Bld 130 (H) 70 - 99 mg/dL    Comment: Glucose reference range applies only to samples taken after fasting for at least 8 hours.   BUN 47 (H) 8 - 23 mg/dL   Creatinine, Ser 2.95 (H) 0.44 - 1.00 mg/dL   Calcium 8.8 (L) 8.9 - 10.3 mg/dL   Total Protein 8.3 (H) 6.5 - 8.1 g/dL   Albumin  3.3 (L) 3.5 - 5.0 g/dL   AST 24 15 - 41 U/L   ALT 14 0 - 44 U/L   Alkaline Phosphatase 47 38 - 126 U/L   Total Bilirubin 0.6 0.3 - 1.2 mg/dL   GFR, Estimated 19 (L) >60 mL/min    Comment: (NOTE) Calculated using the CKD-EPI Creatinine Equation (2021)    Anion gap 11 5 - 15    Comment: Performed at [facility], [address], [place], [place] [address]  CBC with Differential     Status: Abnormal   Collection Time: 02/24/23 11:55 AM  Result Value Ref Range   WBC 9.5 4.0 - 10.5 K/uL   RBC 4.09 3.87 - 5.11 MIL/uL   Hemoglobin 10.7 (L) 12.0 - 15.0 g/dL   HCT 60.4 (L) 54.0 - 98.1 %   MCV 84.8 80.0 - 100.0 fL   MCH 26.2 26.0 - 34.0 pg   MCHC 30.8 30.0 - 36.0 g/dL   RDW 19.1 47.8 -  29.5 %   Platelets 125 (L) 150 - 400 K/uL   nRBC 0.0 0.0 - 0.2 %   Neutrophils Relative % 88 %   Neutro Abs 8.3 (H) 1.7 - 7.7 K/uL   Lymphocytes Relative 6 %   Lymphs Abs 0.5 (L) 0.7 - 4.0 K/uL   Monocytes Relative 5 %   Monocytes Absolute 0.5 0.1 - 1.0 K/uL   Eosinophils Relative 0 %   Eosinophils Absolute 0.0 0.0 - 0.5 K/uL   Basophils Relative 0 %   Basophils Absolute 0.0 0.0 - 0.1 K/uL   Immature Granulocytes 1 %   Abs Immature Granulocytes 0.07 0.00 - 0.07 K/uL    Comment: Performed at [facility], [address], [place], [place] [address]  Protime-INR     Status: Abnormal   Collection Time: 02/24/23 11:55 AM  Result Value Ref Range   Prothrombin Time 16.4 (H) 11.4 - 15.2 seconds   INR 1.3 (H) 0.8 - 1.2    Comment: (NOTE) INR goal varies based on device and disease states. Performed at [facility], [address] [place], [place] [address]   APTT     Status: Abnormal   Collection Time: 02/24/23 11:55 AM  Result Value Ref Range   aPTT 42 (H) 24 - 36 seconds    Comment:        IF BASELINE aPTT IS ELEVATED, SUGGEST PATIENT RISK ASSESSMENT BE USED TO DETERMINE APPROPRIATE ANTICOAGULANT THERAPY. Performed at [facility], [address] [place], [place] [address]   I-Stat Lactic Acid, ED     Status: Abnormal   Collection Time: 02/24/23 12:03 PM  Result Value Ref Range   Lactic Acid, Venous 2.1 (HH) 0.5 - 1.9 mmol/L   Comment NOTIFIED PHYSICIAN    CT Head Wo Contrast  Result Date: 02/24/2023 CLINICAL DATA:  Provided history: Altered mental status, nontraumatic. Episode of confusion. EXAM: CT HEAD WITHOUT CONTRAST TECHNIQUE: Contiguous axial images were obtained from the base of the skull through the vertex without intravenous contrast. RADIATION DOSE REDUCTION: This exam was performed according to the departmental dose-optimization program which includes automated exposure control, adjustment of the mA and/or kV  according to patient size and/or use of iterative reconstruction technique. COMPARISON:  Head CT 10/14/2022. FINDINGS: Brain: No age advanced or lobar predominant parenchymal atrophy. Partially empty sella turcica. There is no acute intracranial hemorrhage. No demarcated cortical infarct. No extra-axial fluid collection. No evidence of an intracranial mass. No midline  shift. Vascular: No hyperdense vessel.  Atherosclerotic calcifications. Skull: No calvarial fracture or aggressive osseous lesion. Sinuses/Orbits: No mass or acute finding within the imaged orbits. Mild mucosal thickening within the bilateral maxillary sinuses at the imaged levels. Minimal mucosal thickening within the right sphenoid sinus. Mild mucosal thickening scattered within bilateral ethmoid air cells. Other: Small volume fluid within the right mastoid air cells. Nonspecific swelling and ill-defined cutaneous/subcutaneous hyperdensity at the right parietal scalp. IMPRESSION: 1.  No evidence of an acute intracranial abnormality. 2. Mild paranasal sinus mucosal thickening at the imaged levels. 3. Nonspecific swelling and ill-defined cutaneous/subcutaneous hyperdensity at the right parietal scalp. Direct visualization recommended. Electronically Signed   By: Jackey Loge D.O.   On: 02/24/2023 14:22   DG Foot Complete Right  Result Date: 02/24/2023 CLINICAL DATA:  Right foot ulcer. EXAM: RIGHT FOOT COMPLETE - 3+ VIEW COMPARISON:  March 03, 2021. FINDINGS: There is no evidence of fracture or dislocation. There is no evidence of arthropathy or other focal bone abnormality. Vascular calcifications are noted. No definite soft tissue ulceration is noted. IMPRESSION: No acute abnormality seen. Electronically Signed   By: Lupita Raider M.D.   On: 02/24/2023 13:07   DG Chest Port 1 View  Result Date: 02/24/2023 CLINICAL DATA:  Confusion.  Possible sepsis. EXAM: PORTABLE CHEST 1 VIEW COMPARISON:  December 19, 2022. FINDINGS: The heart size and  mediastinal contours are within normal limits. Right lung is clear. Minimal left basilar subsegmental atelectasis or scarring is noted. The visualized skeletal structures are unremarkable. IMPRESSION: Minimal left basilar subsegmental atelectasis or scarring. Electronically Signed   By: Lupita Raider M.D.   On: 02/24/2023 13:04    Pending Labs Unresulted Labs (From admission, onward)     Start     Ordered   02/24/23 1138  Urinalysis, w/ Reflex to Culture (Infection Suspected) -Urine, Clean Catch  (Undifferentiated presentation (screening labs and basic nursing orders))  ONCE - URGENT,   URGENT       Question:  Specimen Source  Answer:  Urine, Clean Catch   02/24/23 1137   02/24/23 1137  Blood Culture (routine x 2)  (Undifferentiated presentation (screening labs and basic nursing orders))  BLOOD CULTURE X 2,   STAT      02/24/23 1137            Vitals/Pain Today's Vitals   02/24/23 1230 02/24/23 1245 02/24/23 1300 02/24/23 1330  BP: (!) 104/54  (!) 104/52 (!) 111/56  Pulse: 67 66  67  Resp: (!) 21 19 (!) 21 (!) 23  Temp:      TempSrc:      SpO2: 94% 92%  100%  PainSc:        Isolation Precautions No active isolations  Medications Medications  lactated ringers infusion ( Intravenous New Bag/Given 02/24/23 1353)  metroNIDAZOLE (FLAGYL) IVPB 500 mg (500 mg Intravenous New Bag/Given 02/24/23 1352)  vancomycin (VANCOREADY) IVPB 1500 mg/300 mL (has no administration in time range)  lactated ringers bolus 500 mL (0 mLs Intravenous Stopped 02/24/23 1407)  ceFEPIme (MAXIPIME) 2 g in sodium chloride 0.9 % 100 mL IVPB (0 g Intravenous Stopped 02/24/23 1345)    Mobility walks with device

## 2023-02-24 NOTE — Progress Notes (Signed)
ANTICOAGULATION CONSULT NOTE - Initial Consult  Pharmacy Consult for Apixaban Indication: DVT  No Known Allergies  Patient Measurements:    Vital Signs: Temp: 98.1 F (36.7 C) (09/27 1519) Temp Source: Oral (09/27 1519) BP: 111/56 (09/27 1330) Pulse Rate: 67 (09/27 1330)  Labs: Recent Labs    02/24/23 1155  HGB 10.7*  HCT 34.7*  PLT 125*  APTT 42*  LABPROT 16.4*  INR 1.3*  CREATININE 2.60*    CrCl cannot be calculated (Unknown ideal weight.).   Medical History: Past Medical History:  Diagnosis Date   CHF (congestive heart failure) (HCC)    Hypertension    Hypothyroidism 03/29/2017   Obesity (BMI 30-39.9)    Premature ventricular contractions     Medications:  Prior to admission medications include apixaban Dispense history shows Eliquis 5 mg dispensed on 12/26/2022 with directions to take 10 mg BID for 3 days then take 5 mg BID Per med rec from patient's daughter on telephone, patient taking apixaban differently (20 mg BID) with last dose taken yesterday morning at 0800  Assessment: Pharmacy consulted to dose apixaban for this 72 yo female whose medical history includes bilateral DVT and was on apixaban prior to admission.    Imaging: 7/22 VAS Lower Extremity Venous: Bilateral DVT  Goal of Therapy:  DVT treatment Monitor platelets by anticoagulation protocol: Yes   Plan:  Resume Apixaban at 5 mg po BID starting now With no dose adjustments anticipated, Pharmacy will sign off consult and continue to monitor CBC as ordered by provider and signs/symptoms of bleeding   Thank you for allowing pharmacy to be a part of this patient's care.  Selinda Eon, PharmD, BCPS Clinical Pharmacist [place] 02/24/2023 4:35 PM

## 2023-02-24 NOTE — H&P (Signed)
History and Physical  Michaela Baker ZOX:[id number] DOB: 03-12-1951 DOA: 02/24/2023  PCP: Diamantina Providence, FNP Patient coming from: Home  I have personally briefly reviewed patient's old medical records in [facility]   Chief Complaint: Confusion and fever  HPI: Michaela Baker is a 72 y.o. female past medical history of chronic kidney disease stage III, chronic diastolic heart failure, she is not a diabetic hemoglobin A1c 2 months ago at 4.1, past medical history of right foot ulcer essential hypertension and history of DVT on Eliquis comes in for confusion and fever that started the day prior to admission, she has had a blister on the right foot for about 2 weeks went and saw the podiatrist who gave her conservative measures.  But then about 2 days prior to admission started hurting progressively getting worse.  Part of the history is provided by the daughter who relates she was started getting confused  In the ED: She was found to be afebrile, no leukocytosis mild lactic acidosis.  Chest x-ray showed no acute findings, right foot x-ray showed ulcer or bone deformities. CT of the head showed no acute findings   Review of Systems: All systems reviewed and apart from history of presenting illness, are negative.  Past Medical History:  Diagnosis Date   CHF (congestive heart failure) (HCC)    Hypertension    Hypothyroidism 03/29/2017   Obesity (BMI 30-39.9)    Premature ventricular contractions    Past Surgical History:  Procedure Laterality Date   ABDOMINAL HYSTERECTOMY     CHOLECYSTECTOMY     COLONOSCOPY WITH PROPOFOL N/A 12/29/2021   Procedure: COLONOSCOPY WITH PROPOFOL;  Surgeon: Kathi Der, MD;  Location: [facility];  Service: Gastroenterology;  Laterality: N/A;   ENDOVENOUS ABLATION SAPHENOUS VEIN W/ LASER Right 01/26/2022   endovenous laser ablation right greater saphenous vein by Cari Caraway MD   TONSILLECTOMY     TUBAL LIGATION     Social History:   reports that she has never smoked. She has never used smokeless tobacco. She reports that she does not drink alcohol and does not use drugs.   No Known Allergies  Family History  Problem Relation Age of Onset   Diabetes Mother    Heart failure Mother    Kidney failure Mother    Kidney failure Sister    Diabetes Sister    Heart attack Sister    Breast cancer Neg Hx     Prior to Admission medications   Medication Sig Start Date End Date Taking? Authorizing Provider  acetaminophen (TYLENOL) 500 MG tablet Take 1,000 mg by mouth as needed (pain).    [provider]  amLODipine (NORVASC) 10 MG tablet Take 10 mg by mouth daily. 02/04/20   [provider]  apixaban (ELIQUIS) 5 MG TABS tablet Take 2 tablets (10 mg total) by mouth 2 (two) times daily for 3 days, THEN 1 tablet (5 mg total) 2 (two) times daily. 12/25/22 03/28/23  Pokhrel, Rebekah Chesterfield, MD  aspirin EC 81 MG EC tablet Take 1 tablet (81 mg total) by mouth daily. 07/26/13   Leatha Gilding, MD  dapagliflozin propanediol (FARXIGA) 10 MG TABS tablet Take 10 mg by mouth daily.    [provider]  folic acid (FOLVITE) 1 MG tablet Take 1 tablet (1 mg total) by mouth daily. 11/13/22   Glade Lloyd, MD  furosemide (LASIX) 40 MG tablet Take 40 mg by mouth 2 (two) times daily. 11/21/22   [provider]  hydrALAZINE (APRESOLINE) 50 MG tablet Take 50 mg by mouth daily.    [provider]  KERENDIA 10 MG TABS take 1 tablet by mouth daily for chronic kidney disease    [provider]  levothyroxine (SYNTHROID, LEVOTHROID) 25 MCG tablet Take 12.5 mcg by mouth daily. 02/05/15   [provider]  metoprolol succinate (TOPROL-XL) 50 MG 24 hr tablet Take 50 mg by mouth daily. Take with or immediately following a meal.    [provider]  Multiple Vitamins-Minerals (MULTIVITAMIN WITH MINERALS) tablet Take 2 tablets by mouth daily.    [provider]  mupirocin ointment (BACTROBAN) 2 %  apply 1 application topically 2 times daily 02/23/23   Standiford, Jenelle Mages, DPM  potassium chloride SA (KLOR-CON M) 20 MEQ tablet Take 1 tablet (20 mEq total) by mouth daily. 12/25/22   Pokhrel, Rebekah Chesterfield, MD  rosuvastatin (CRESTOR) 5 MG tablet Take 5 mg by mouth daily. 05/07/21   [provider]   Physical Exam: Vitals:   02/24/23 1230 02/24/23 1245 02/24/23 1300 02/24/23 1330  BP: (!) 104/54  (!) 104/52 (!) 111/56  Pulse: 67 66  67  Resp: (!) 21 19 (!) 21 (!) 23  Temp:      TempSrc:      SpO2: 94% 92%  100%    General exam: Moderately built and nourished patient, lying comfortably supine on the gurney in no obvious distress. Head, eyes and ENT: Nontraumatic and normocephalic. Neck: Supple. No JVD, carotid bruit or thyromegaly. Lymphatics: No lymphadenopathy. Respiratory system: Clear to auscultation. No increased work of breathing. Cardiovascular system: S1 and S2 heard, RRR.  Gastrointestinal system: Abdomen is nondistended, soft and nontender.  Central nervous system: Alert and oriented. No focal neurological deficits. Extremities: Symmetric 5 x 5 power. Peripheral pulses symmetrically felt.  Skin: No rashes or acute findings. Musculoskeletal system: Negative exam. Psychiatry: Pleasant and cooperative.   Labs on Admission:  Basic Metabolic Panel: Recent Labs  Lab 02/24/23 1155  NA 138  K 3.0*  CL 108  CO2 19*  GLUCOSE 130*  BUN 47*  CREATININE 2.60*  CALCIUM 8.8*   Liver Function Tests: Recent Labs  Lab 02/24/23 1155  AST 24  ALT 14  ALKPHOS 47  BILITOT 0.6  PROT 8.3*  ALBUMIN 3.3*   No results for input(s): "LIPASE", "AMYLASE" in the last 168 hours. No results for input(s): "AMMONIA" in the last 168 hours. CBC: Recent Labs  Lab 02/24/23 1155  WBC 9.5  NEUTROABS 8.3*  HGB 10.7*  HCT 34.7*  MCV 84.8  PLT 125*   Cardiac Enzymes: No results for input(s): "CKTOTAL", "CKMB", "CKMBINDEX", "TROPONINI" in the last 168 hours.  BNP (last 3  results) No results for input(s): "PROBNP" in the last 8760 hours. CBG: No results for input(s): "GLUCAP" in the last 168 hours.  Radiological Exams on Admission: CT Head Wo Contrast  Result Date: 02/24/2023 CLINICAL DATA:  Provided history: Altered mental status, nontraumatic. Episode of confusion. EXAM: CT HEAD WITHOUT CONTRAST TECHNIQUE: Contiguous axial images were obtained from the base of the skull through the vertex without intravenous contrast. RADIATION DOSE REDUCTION: This exam was performed according to the departmental dose-optimization program which includes automated exposure control, adjustment of the mA and/or kV according to patient size and/or use of iterative reconstruction technique. COMPARISON:  Head CT 10/14/2022. FINDINGS: Brain: No age advanced or lobar predominant parenchymal atrophy. Partially empty sella turcica. There is no acute intracranial hemorrhage. No demarcated cortical infarct. No extra-axial fluid collection. No  evidence of an intracranial mass. No midline shift. Vascular: No hyperdense vessel.  Atherosclerotic calcifications. Skull: No calvarial fracture or aggressive osseous lesion. Sinuses/Orbits: No mass or acute finding within the imaged orbits. Mild mucosal thickening within the bilateral maxillary sinuses at the imaged levels. Minimal mucosal thickening within the right sphenoid sinus. Mild mucosal thickening scattered within bilateral ethmoid air cells. Other: Small volume fluid within the right mastoid air cells. Nonspecific swelling and ill-defined cutaneous/subcutaneous hyperdensity at the right parietal scalp. IMPRESSION: 1.  No evidence of an acute intracranial abnormality. 2. Mild paranasal sinus mucosal thickening at the imaged levels. 3. Nonspecific swelling and ill-defined cutaneous/subcutaneous hyperdensity at the right parietal scalp. Direct visualization recommended. Electronically Signed   By: Jackey Loge D.O.   On: 02/24/2023 14:22   DG Foot  Complete Right  Result Date: 02/24/2023 CLINICAL DATA:  Right foot ulcer. EXAM: RIGHT FOOT COMPLETE - 3+ VIEW COMPARISON:  March 03, 2021. FINDINGS: There is no evidence of fracture or dislocation. There is no evidence of arthropathy or other focal bone abnormality. Vascular calcifications are noted. No definite soft tissue ulceration is noted. IMPRESSION: No acute abnormality seen. Electronically Signed   By: Lupita Raider M.D.   On: 02/24/2023 13:07   DG Chest Port 1 View  Result Date: 02/24/2023 CLINICAL DATA:  Confusion.  Possible sepsis. EXAM: PORTABLE CHEST 1 VIEW COMPARISON:  December 19, 2022. FINDINGS: The heart size and mediastinal contours are within normal limits. Right lung is clear. Minimal left basilar subsegmental atelectasis or scarring is noted. The visualized skeletal structures are unremarkable. IMPRESSION: Minimal left basilar subsegmental atelectasis or scarring. Electronically Signed   By: Lupita Raider M.D.   On: 02/24/2023 13:04    EKG: Independently reviewed.  Sinus rhythm normal axis no T wave abnormalities  Assessment/Plan Severe sepsis secondary to nonpurulent cellulitis of left foot: Blood cultures were obtained, he was started on IV cefepime and Flagyl. Will de-escalate to IV Rocephin and doxycycline. Her hemoglobin A1c is 5.4 about a month ago. Get PT OT to see her.  Acute metabolic encephalopathy: Still slow to respond but awake, her daughter relates that she was confused on and off at home that was a concern to bring her into the hospital. Likely due to infectious etiology he was started on IV fluids and IV antibiotics confusion seems to be improved.  Acute kidney injury superimposed on chronic kidney disease 3B With a baseline creatinine 1.3-1.6 on admission 2.6. Hold all nephrotoxic drugs. Started on IV fluids and recheck basic metabolic panel in the morning. Hold ARB and diuretics.  Essential hypertension Blood pressure soft and she is in acute kidney  injury hold all antihypertensive medication. Except metoprolol.  Obesity (BMI 30-39.9) Counseling.     DVT Prophylaxis: lovenox Code Status: Full  Family Communication: Daughter  Disposition Plan: inpatient       It is my clinical opinion that admission to INPATIENT is reasonable and necessary in this 72 y.o. female severe sepsis due to right foot cellulitis  Given the aforementioned, the predictability of an adverse outcome is felt to be significant. I expect that the patient will require at least 2 midnights in the hospital to treat this condition.  Marinda Elk MD [facility]   02/24/2023, 2:29 PM

## 2023-02-24 NOTE — Plan of Care (Signed)

## 2023-02-24 NOTE — Progress Notes (Signed)
A consult was received from an ED physician for Cefepime, Vancomycin per pharmacy dosing.  The patient's profile has been reviewed for ht/wt/allergies/indication/available labs.   A one time order has been placed for Cefepime 2g, Vancomycin 1500 mg.    Further antibiotics/pharmacy consults should be ordered by admitting physician if indicated.                       Thank you,  Lynann Beaver PharmD, BCPS [facility] main pharmacy [phone] 02/24/2023 1:03 PM

## 2023-02-24 NOTE — Sepsis Progress Note (Signed)
eLink is following this Code Sepsis. °

## 2023-02-24 NOTE — ED Triage Notes (Signed)
BIBA from home for episode of confusion, fever, has ulcer to right foot. No c/o pain, tx for PNE 2 weeks ago. 650 tylenol given PTA  20 lfa 104.3 temp 105/64 BP 94 HR 28 RR 96% 2lpm

## 2023-02-24 NOTE — ED Notes (Signed)
Unable to draw second set of cultures at this time
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
## 2023-02-25 DIAGNOSIS — N179 Acute kidney failure, unspecified: Secondary | ICD-10-CM | POA: Diagnosis not present

## 2023-02-25 DIAGNOSIS — L97511 Non-pressure chronic ulcer of other part of right foot limited to breakdown of skin: Secondary | ICD-10-CM | POA: Diagnosis not present

## 2023-02-25 DIAGNOSIS — L03116 Cellulitis of left lower limb: Secondary | ICD-10-CM | POA: Diagnosis not present

## 2023-02-25 DIAGNOSIS — A419 Sepsis, unspecified organism: Secondary | ICD-10-CM | POA: Diagnosis not present

## 2023-02-25 LAB — BASIC METABOLIC PANEL
Anion gap: 9 (ref 5–15)
BUN: 41 mg/dL — ABNORMAL HIGH (ref 8–23)
CO2: 20 mmol/L — ABNORMAL LOW (ref 22–32)
Calcium: 8.6 mg/dL — ABNORMAL LOW (ref 8.9–10.3)
Chloride: 109 mmol/L (ref 98–111)
Creatinine, Ser: 2.29 mg/dL — ABNORMAL HIGH (ref 0.44–1.00)
GFR, Estimated: 22 mL/min — ABNORMAL LOW (ref >60)
Glucose, Bld: 91 mg/dL (ref 70–99)
Potassium: 3.1 mmol/L — ABNORMAL LOW (ref 3.5–5.1)
Sodium: 138 mmol/L (ref 135–145)

## 2023-02-25 LAB — CBC
HCT: 31.2 % — ABNORMAL LOW (ref 36.0–46.0)
Hemoglobin: 9.5 g/dL — ABNORMAL LOW (ref 12.0–15.0)
MCH: 26.7 pg (ref 26.0–34.0)
MCHC: 30.4 g/dL (ref 30.0–36.0)
MCV: 87.6 fL (ref 80.0–100.0)
Platelets: 115 10*3/uL — ABNORMAL LOW (ref 150–400)
RBC: 3.56 MIL/uL — ABNORMAL LOW (ref 3.87–5.11)
RDW: 14.9 % (ref 11.5–15.5)
WBC: 5.5 10*3/uL (ref 4.0–10.5)
nRBC: 0 % (ref 0.0–0.2)

## 2023-02-25 LAB — URINALYSIS, W/ REFLEX TO CULTURE (INFECTION SUSPECTED)
Bilirubin Urine: NEGATIVE
Glucose, UA: 50 mg/dL — AB
Ketones, ur: NEGATIVE mg/dL
Leukocytes,Ua: NEGATIVE
Nitrite: NEGATIVE
Protein, ur: 100 mg/dL — AB
RBC / HPF: >50 RBC/hpf (ref 0–5)
Specific Gravity, Urine: 1.018 (ref 1.005–1.030)
pH: 6 (ref 5.0–8.0)

## 2023-02-25 MED ORDER — POTASSIUM CHLORIDE CRYS ER 20 MEQ PO TBCR
40.0000 meq | EXTENDED_RELEASE_TABLET | Freq: Two times a day (BID) | ORAL | Status: AC
  Administered 2023-02-25 (×2): 40 meq via ORAL
  Filled 2023-02-25 (×2): qty 2

## 2023-02-25 NOTE — Progress Notes (Signed)
[facility] PROGRESS NOTE    Progress Note  Michaela Baker  UXL:[id number] DOB: [birth year] DOA: 02/24/2023 PCP: Diamantina Providence, FNP     Brief Narrative:   Michaela Baker is an 72 y.o. female  past medical history of chronic kidney disease stage III, chronic diastolic heart failure, she is not a diabetic hemoglobin A1c 2 months ago at 4.1, past medical history of right foot ulcer essential hypertension and history of DVT on Eliquis comes in for confusion and fever that started the day prior to admission, she has had a blister on the right foot for about 2 weeks     Assessment/Plan:   Severe sepsis secondary to nonpurulent cellulitis of the left foot: She was fluid resuscitated started empirically on IV Doxy and Rocephin Tmax of 101, leukocytosis improved. Continue IV fluids for today blood pressure soft.  Acute metabolic encephalopathy: Likely due to infectious etiology now resolved.  Acute kidney injury superimposed on chronic kidney disease (HCC) Baseline creatinine 1.3-1.6 on admission 2.6, this morning 2.2, likely prerenal azotemia improving with IV fluids continue IV fluids for an additional 12 hours.  Essential hypertension: Blood pressure has remained stable, continue to hold antihypertensive medication except for beta-blockers.  Obesity (BMI 30-39.9) Noted.  History of DVT: Continue Eliquis.  DVT prophylaxis: Eliquis Family Communication: Daughter and son Status is: Inpatient Remains inpatient appropriate because: Severe sepsis    Code Status:     Code Status Orders  (From admission, onward)           Start     Ordered   02/24/23 1459  Full code  Continuous       Question:  By:  Answer:  Consent: discussion documented in EHR   02/24/23 1502           Code Status History     Date Active Date Inactive Code Status Order ID Comments User Context   12/19/2022 2132 12/25/2022 1838 Full Code [id number]  Therisa Doyne, MD ED   11/09/2022  1836 11/13/2022 1851 Full Code [id number]  Verdene Lennert, MD ED   06/01/2022 1106 06/04/2022 1929 Full Code [id number]  Merrilyn Puma, MD ED   12/27/2021 2020 12/31/2021 1831 Full Code [id number]  Alba Cory, MD Inpatient   03/03/2021 0815 03/16/2021 1749 Full Code [id number]  Bobette Mo, MD ED   03/29/2017 1238 03/30/2017 1418 Full Code [id number]  Pearson Grippe, MD Inpatient   07/22/2013 0101 07/26/2013 1545 Full Code [id number]  Eduard Clos, MD Inpatient         IV Access:   Peripheral IV   Procedures and diagnostic studies:   CT Head Wo Contrast  Result Date: 02/24/2023 CLINICAL DATA:  Provided history: Altered mental status, nontraumatic. Episode of confusion. EXAM: CT HEAD WITHOUT CONTRAST TECHNIQUE: Contiguous axial images were obtained from the base of the skull through the vertex without intravenous contrast. RADIATION DOSE REDUCTION: This exam was performed according to the departmental dose-optimization program which includes automated exposure control, adjustment of the mA and/or kV according to patient size and/or use of iterative reconstruction technique. COMPARISON:  Head CT 10/14/2022. FINDINGS: Brain: No age advanced or lobar predominant parenchymal atrophy. Partially empty sella turcica. There is no acute intracranial hemorrhage. No demarcated cortical infarct. No extra-axial fluid collection. No evidence of an intracranial mass. No midline shift. Vascular: No hyperdense vessel.  Atherosclerotic calcifications. Skull: No calvarial fracture or aggressive osseous lesion. Sinuses/Orbits: No mass or acute finding within the imaged orbits. Mild mucosal thickening within  the bilateral maxillary sinuses at the imaged levels. Minimal mucosal thickening within the right sphenoid sinus. Mild mucosal thickening scattered within bilateral ethmoid air cells. Other: Small volume fluid within the right mastoid air cells. Nonspecific swelling and ill-defined cutaneous/subcutaneous  hyperdensity at the right parietal scalp. IMPRESSION: 1.  No evidence of an acute intracranial abnormality. 2. Mild paranasal sinus mucosal thickening at the imaged levels. 3. Nonspecific swelling and ill-defined cutaneous/subcutaneous hyperdensity at the right parietal scalp. Direct visualization recommended. Electronically Signed   By: Jackey Loge D.O.   On: 02/24/2023 14:22   DG Foot Complete Right  Result Date: 02/24/2023 CLINICAL DATA:  Right foot ulcer. EXAM: RIGHT FOOT COMPLETE - 3+ VIEW COMPARISON:  March 03, 2021. FINDINGS: There is no evidence of fracture or dislocation. There is no evidence of arthropathy or other focal bone abnormality. Vascular calcifications are noted. No definite soft tissue ulceration is noted. IMPRESSION: No acute abnormality seen. Electronically Signed   By: Lupita Raider M.D.   On: 02/24/2023 13:07   DG Chest Port 1 View  Result Date: 02/24/2023 CLINICAL DATA:  Confusion.  Possible sepsis. EXAM: PORTABLE CHEST 1 VIEW COMPARISON:  December 19, 2022. FINDINGS: The heart size and mediastinal contours are within normal limits. Right lung is clear. Minimal left basilar subsegmental atelectasis or scarring is noted. The visualized skeletal structures are unremarkable. IMPRESSION: Minimal left basilar subsegmental atelectasis or scarring. Electronically Signed   By: Lupita Raider M.D.   On: 02/24/2023 13:04     Medical Consultants:   None.   Subjective:    Michaela Baker relates her leg is less painful or swollen.  Objective:    Vitals:   02/24/23 1657 02/24/23 2044 02/25/23 0210 02/25/23 0557  BP: (!) 99/49 (!) 124/54 (!) 113/55 111/66  Pulse: 69 70 63 (!) 57  Resp: 19 17 16 18   Temp: 98.1 F (36.7 C) (!) 101 F (38.3 C) 97.7 F (36.5 C) 98.1 F (36.7 C)  TempSrc: Oral Oral Oral Oral  SpO2: 93% 98% 94% 95%   SpO2: 95 %   Intake/Output Summary (Last 24 hours) at 02/25/2023 7564 Last data filed at 02/25/2023 0400 Gross per 24 hour  Intake 3341.13  ml  Output --  Net 3341.13 ml   There were no vitals filed for this visit.  Exam: General exam: In no acute distress. Respiratory system: Good air movement and clear to auscultation. Cardiovascular system: S1 & S2 heard, RRR. No JVD. Gastrointestinal system: Abdomen is nondistended, soft and nontender.  Central nervous system: Alert and oriented. No focal neurological deficits. Extremities: No edema Skin: Erythema has improved still warm to touch. Psychiatry: Judgement and insight appear normal. Mood & affect appropriate.    Data Reviewed:    Labs: Basic Metabolic Panel: Recent Labs  Lab 02/24/23 1155 02/25/23 0436  NA 138 138  K 3.0* 3.1*  CL 108 109  CO2 19* 20*  GLUCOSE 130* 91  BUN 47* 41*  CREATININE 2.60* 2.29*  CALCIUM 8.8* 8.6*   GFR CrCl cannot be calculated (Unknown ideal weight.). Liver Function Tests: Recent Labs  Lab 02/24/23 1155  AST 24  ALT 14  ALKPHOS 47  BILITOT 0.6  PROT 8.3*  ALBUMIN 3.3*   No results for input(s): "LIPASE", "AMYLASE" in the last 168 hours. No results for input(s): "AMMONIA" in the last 168 hours. Coagulation profile Recent Labs  Lab 02/24/23 1155  INR 1.3*   COVID-19 Labs  No results for input(s): "DDIMER", "FERRITIN", "LDH", "CRP"  in the last 72 hours.  Lab Results  Component Value Date   SARSCOV2NAA NEGATIVE 05/31/2022   SARSCOV2NAA NEGATIVE 03/03/2021    CBC: Recent Labs  Lab 02/24/23 1155 02/25/23 0436  WBC 9.5 5.5  NEUTROABS 8.3*  --   HGB 10.7* 9.5*  HCT 34.7* 31.2*  MCV 84.8 87.6  PLT 125* 115*   Cardiac Enzymes: No results for input(s): "CKTOTAL", "CKMB", "CKMBINDEX", "TROPONINI" in the last 168 hours. BNP (last 3 results) No results for input(s): "PROBNP" in the last 8760 hours. CBG: No results for input(s): "GLUCAP" in the last 168 hours. D-Dimer: No results for input(s): "DDIMER" in the last 72 hours. Hgb A1c: No results for input(s): "HGBA1C" in the last 72 hours. Lipid  Profile: No results for input(s): "CHOL", "HDL", "LDLCALC", "TRIG", "CHOLHDL", "LDLDIRECT" in the last 72 hours. Thyroid function studies: No results for input(s): "TSH", "T4TOTAL", "T3FREE", "THYROIDAB" in the last 72 hours.  Invalid input(s): "FREET3" Anemia work up: No results for input(s): "VITAMINB12", "FOLATE", "FERRITIN", "TIBC", "IRON", "RETICCTPCT" in the last 72 hours. Sepsis Labs: Recent Labs  Lab 02/24/23 1155 02/24/23 1203 02/24/23 1433 02/25/23 0436  WBC 9.5  --   --  5.5  LATICACIDVEN  --  2.1* 2.0*  --    Microbiology Recent Results (from the past 240 hour(s))  Blood Culture (routine x 2)     Status: None (Preliminary result)   Collection Time: 02/24/23  5:18 PM   Specimen: BLOOD RIGHT ARM  Result Value Ref Range Status   Specimen Description   Final    BLOOD RIGHT ARM Performed at [facility], [address], [place], [place] [address]    Special Requests   Final    BOTTLES DRAWN AEROBIC ONLY Blood Culture adequate volume Performed at [facility], [address], [place], [place] [address]    Culture PENDING  Incomplete   Report Status PENDING  Incomplete     Medications:    apixaban  5 mg Oral BID   aspirin EC  81 mg Oral Daily   doxycycline  100 mg Oral Q12H   levothyroxine  12.5 mcg Oral Daily   metoprolol succinate  50 mg Oral Daily   potassium chloride  20 mEq Oral BID   rosuvastatin  5 mg Oral QHS   senna  1 tablet Oral BID   Continuous Infusions:  sodium chloride 75 mL/hr at 02/25/23 0400   cefTRIAXone (ROCEPHIN)  IV     lactated ringers 150 mL/hr at 02/25/23 0400      LOS: 1 day   Marinda Elk  [facility]  02/25/2023, 7:12 AM

## 2023-02-25 NOTE — Plan of Care (Signed)

## 2023-02-26 DIAGNOSIS — L03116 Cellulitis of left lower limb: Secondary | ICD-10-CM | POA: Diagnosis not present

## 2023-02-26 LAB — BASIC METABOLIC PANEL
Anion gap: 6 (ref 5–15)
BUN: 38 mg/dL — ABNORMAL HIGH (ref 8–23)
CO2: 19 mmol/L — ABNORMAL LOW (ref 22–32)
Calcium: 8.6 mg/dL — ABNORMAL LOW (ref 8.9–10.3)
Chloride: 112 mmol/L — ABNORMAL HIGH (ref 98–111)
Creatinine, Ser: 2.1 mg/dL — ABNORMAL HIGH (ref 0.44–1.00)
GFR, Estimated: 24 mL/min — ABNORMAL LOW (ref >60)
Glucose, Bld: 82 mg/dL (ref 70–99)
Potassium: 3.7 mmol/L (ref 3.5–5.1)
Sodium: 137 mmol/L (ref 135–145)

## 2023-02-26 MED ORDER — CEFDINIR 300 MG PO CAPS: 300.0000 mg | ORAL_CAPSULE | Freq: Two times a day (BID) | ORAL | 0 refills | Status: AC

## 2023-02-26 MED ORDER — DOXYCYCLINE HYCLATE 100 MG PO TABS: 100.0000 mg | ORAL_TABLET | Freq: Two times a day (BID) | ORAL | 0 refills | Status: AC

## 2023-02-26 NOTE — Plan of Care (Signed)
Patient verbalized understanding of discharge instructions.  Problem: Education: Goal: Knowledge of General Education information will improve Description: Including pain rating scale, medication(s)/side effects and non-pharmacologic comfort measures Outcome: Adequate for Discharge   Problem: Health Behavior/Discharge Planning: Goal: Ability to manage health-related needs will improve Outcome: Adequate for Discharge   Problem: Clinical Measurements: Goal: Ability to maintain clinical measurements within normal limits will improve Outcome: Adequate for Discharge Goal: Will remain free from infection Outcome: Adequate for Discharge Goal: Diagnostic test results will improve Outcome: Adequate for Discharge Goal: Respiratory complications will improve Outcome: Adequate for Discharge Goal: Cardiovascular complication will be avoided Outcome: Adequate for Discharge   Problem: Activity: Goal: Risk for activity intolerance will decrease Outcome: Adequate for Discharge   Problem: Nutrition: Goal: Adequate nutrition will be maintained Outcome: Adequate for Discharge   Problem: Coping: Goal: Level of anxiety will decrease Outcome: Adequate for Discharge   Problem: Elimination: Goal: Will not experience complications related to bowel motility Outcome: Adequate for Discharge Goal: Will not experience complications related to urinary retention Outcome: Adequate for Discharge   Problem: Pain Managment: Goal: General experience of comfort will improve Outcome: Adequate for Discharge   Problem: Safety: Goal: Ability to remain free from injury will improve Outcome: Adequate for Discharge   Problem: Skin Integrity: Goal: Risk for impaired skin integrity will decrease Outcome: Adequate for Discharge

## 2023-02-26 NOTE — Plan of Care (Signed)

## 2023-02-26 NOTE — Discharge Summary (Signed)
Physician Discharge Summary  Michaela Baker:[id number] DOB: June 10, 1950 DOA: 02/24/2023  PCP: Diamantina Providence, FNP  Admit date: 02/24/2023 Discharge date: 02/26/2023  Admitted From: Home Disposition:  Home  Recommendations for Outpatient Follow-up:  Follow up with podiatrist in 1-2 weeks   Home Health:Yes Equipment/Devices:None  Discharge Condition:Stable CODE STATUS:Full Diet recommendation: Heart Healthy  Brief/Interim Summary: 72 y.o. female  past medical history of chronic kidney disease stage III, chronic diastolic heart failure, she is not a diabetic hemoglobin A1c 2 months ago at 4.1, past medical history of right foot ulcer essential hypertension and history of DVT on Eliquis comes in for confusion and fever that started the day prior to admission, she has had a blister on the right foot for about 2 weeks   Discharge Diagnoses:  Principal Problem:   Cellulitis of left foot Active Problems:   Acute kidney injury superimposed on chronic kidney disease (HCC)   Hypertension   Obesity (BMI 30-39.9)   Severe sepsis (HCC)   Benign essential hypertension   Type 2 diabetes mellitus (HCC)   Acute metabolic encephalopathy   Cellulitis of right foot  Severe sepsis secondary to nonpurulent cellulitis of the left foot: She was resuscitated started on IV Doxy and Rocephin she defervesced leukocytosis improved. She will continue Omnicef and Doxy as an outpatient for 7 days.  Acute metabolic encephalopathy: Resolved likely due to infectious etiology.  Acute kidney injury on chronic kidney disease stage IIIa: Likely prerenal azotemia resolved with IV fluids creatinine returned to baseline 1.3-1.6.  Central hypertension: No changes made to her medication.  Morbid obesity: Noted.  History of DVT: Continue Eliquis.  Discharge Instructions  Discharge Instructions     Diet - low sodium heart healthy   Complete by: As directed    Increase activity slowly   Complete by:  As directed    No wound care   Complete by: As directed       Allergies as of 02/26/2023   No Known Allergies      Medication List     STOP taking these medications    folic acid 1 MG tablet Commonly known as: FOLVITE       TAKE these medications    acetaminophen 500 MG tablet Commonly known as: TYLENOL Take 1,000 mg by mouth every 8 (eight) hours as needed (for pain).   amLODipine 10 MG tablet Commonly known as: NORVASC Take 10 mg by mouth daily.   apixaban 5 MG Tabs tablet Commonly known as: ELIQUIS Take 2 tablets (10 mg total) by mouth 2 (two) times daily for 3 days, THEN 1 tablet (5 mg total) 2 (two) times daily. Start taking on: December 25, 2022 What changed: See the new instructions.   aspirin EC 81 MG tablet Take 1 tablet (81 mg total) by mouth daily.   cefdinir 300 MG capsule Commonly known as: OMNICEF Take 1 capsule (300 mg total) by mouth 2 (two) times daily for 7 days.   dapagliflozin propanediol 10 MG Tabs tablet Commonly known as: FARXIGA Take 10 mg by mouth daily.   doxycycline 100 MG tablet Commonly known as: VIBRA-TABS Take 1 tablet (100 mg total) by mouth every 12 (twelve) hours for 7 days.   furosemide 40 MG tablet Commonly known as: LASIX Take 40 mg by mouth in the morning and at bedtime.   hydrALAZINE 50 MG tablet Commonly known as: APRESOLINE Take 50 mg by mouth daily.   Kerendia 10 MG Tabs Generic drug: Finerenone Take 10 mg  by mouth daily.   levothyroxine 25 MCG tablet Commonly known as: SYNTHROID Take 25 mcg by mouth daily before breakfast.   metoprolol succinate 50 MG 24 hr tablet Commonly known as: TOPROL-XL Take 50 mg by mouth daily. Take with or immediately following a meal.   multivitamin with minerals tablet Take 1 tablet by mouth daily with breakfast.   mupirocin ointment 2 % Commonly known as: BACTROBAN apply 1 application topically 2 times daily What changed:  how much to take when to take this additional  instructions   potassium chloride SA 20 MEQ tablet Commonly known as: KLOR-CON M Take 1 tablet (20 mEq total) by mouth daily.   rosuvastatin 5 MG tablet Commonly known as: CRESTOR Take 5 mg by mouth daily.        No Known Allergies  Consultations: None   Procedures/Studies: CT Head Wo Contrast  Result Date: 02/24/2023 CLINICAL DATA:  Provided history: Altered mental status, nontraumatic. Episode of confusion. EXAM: CT HEAD WITHOUT CONTRAST TECHNIQUE: Contiguous axial images were obtained from the base of the skull through the vertex without intravenous contrast. RADIATION DOSE REDUCTION: This exam was performed according to the departmental dose-optimization program which includes automated exposure control, adjustment of the mA and/or kV according to patient size and/or use of iterative reconstruction technique. COMPARISON:  Head CT 10/14/2022. FINDINGS: Brain: No age advanced or lobar predominant parenchymal atrophy. Partially empty sella turcica. There is no acute intracranial hemorrhage. No demarcated cortical infarct. No extra-axial fluid collection. No evidence of an intracranial mass. No midline shift. Vascular: No hyperdense vessel.  Atherosclerotic calcifications. Skull: No calvarial fracture or aggressive osseous lesion. Sinuses/Orbits: No mass or acute finding within the imaged orbits. Mild mucosal thickening within the bilateral maxillary sinuses at the imaged levels. Minimal mucosal thickening within the right sphenoid sinus. Mild mucosal thickening scattered within bilateral ethmoid air cells. Other: Small volume fluid within the right mastoid air cells. Nonspecific swelling and ill-defined cutaneous/subcutaneous hyperdensity at the right parietal scalp. IMPRESSION: 1.  No evidence of an acute intracranial abnormality. 2. Mild paranasal sinus mucosal thickening at the imaged levels. 3. Nonspecific swelling and ill-defined cutaneous/subcutaneous hyperdensity at the right parietal  scalp. Direct visualization recommended. Electronically Signed   By: Jackey Loge D.O.   On: 02/24/2023 14:22   DG Foot Complete Right  Result Date: 02/24/2023 CLINICAL DATA:  Right foot ulcer. EXAM: RIGHT FOOT COMPLETE - 3+ VIEW COMPARISON:  March 03, 2021. FINDINGS: There is no evidence of fracture or dislocation. There is no evidence of arthropathy or other focal bone abnormality. Vascular calcifications are noted. No definite soft tissue ulceration is noted. IMPRESSION: No acute abnormality seen. Electronically Signed   By: Lupita Raider M.D.   On: 02/24/2023 13:07   DG Chest Port 1 View  Result Date: 02/24/2023 CLINICAL DATA:  Confusion.  Possible sepsis. EXAM: PORTABLE CHEST 1 VIEW COMPARISON:  December 19, 2022. FINDINGS: The heart size and mediastinal contours are within normal limits. Right lung is clear. Minimal left basilar subsegmental atelectasis or scarring is noted. The visualized skeletal structures are unremarkable. IMPRESSION: Minimal left basilar subsegmental atelectasis or scarring. Electronically Signed   By: Lupita Raider M.D.   On: 02/24/2023 13:04   MM 3D SCREENING MAMMOGRAM BILATERAL BREAST  Result Date: 02/03/2023 CLINICAL DATA:  Screening. EXAM: DIGITAL SCREENING BILATERAL MAMMOGRAM WITH TOMOSYNTHESIS AND CAD TECHNIQUE: Bilateral screening digital craniocaudal and mediolateral oblique mammograms were obtained. Bilateral screening digital breast tomosynthesis was performed. The images were evaluated with computer-aided detection.  COMPARISON:  Previous exam(s). ACR Breast Density Category b: There are scattered areas of fibroglandular density. FINDINGS: There are no findings suspicious for malignancy. IMPRESSION: No mammographic evidence of malignancy. A result letter of this screening mammogram will be mailed directly to the patient. RECOMMENDATION: Screening mammogram in one year. (Code:SM-B-01Y) BI-RADS CATEGORY  1: Negative. Electronically Signed   By: Baird Lyons M.D.   On:  02/03/2023 12:25   (Echo, Carotid, EGD, Colonoscopy, ERCP)    Subjective:  No complaints Discharge Exam: Vitals:   02/26/23 0604 02/26/23 0610  BP: 123/66   Pulse: (!) 58   Resp: 20   Temp:  98.1 F (36.7 C)  SpO2: 96%    Vitals:   02/25/23 0929 02/25/23 1946 02/26/23 0604 02/26/23 0610  BP: (!) 127/58 (!) 113/56 123/66   Pulse: 63 70 (!) 58   Resp:  20 20   Temp:  98.7 F (37.1 C)  98.1 F (36.7 C)  TempSrc:  Oral  Oral  SpO2:  92% 96%   Weight:        General: Pt is alert, awake, not in acute distress Cardiovascular: RRR, S1/S2 +, no rubs, no gallops Respiratory: CTA bilaterally, no wheezing, no rhonchi Abdominal: Soft, NT, ND, bowel sounds + Extremities: no edema, no cyanosis    The results of significant diagnostics from this hospitalization (including imaging, microbiology, ancillary and laboratory) are listed below for reference.     Microbiology: Recent Results (from the past 240 hour(s))  Blood Culture (routine x 2)     Status: None (Preliminary result)   Collection Time: 02/24/23 11:50 AM   Specimen: BLOOD  Result Value Ref Range Status   Specimen Description   Final    BLOOD SITE NOT SPECIFIED Performed at [facility], [address] [place], [place] [address]    Special Requests   Final    BOTTLES DRAWN AEROBIC ONLY Blood Culture adequate volume Performed at [facility], [address], [place], [place] [address]    Culture   Final    NO GROWTH < 24 HOURS Performed at [facility], [address] [place], [place] [address]    Report Status PENDING  Incomplete  Blood Culture (routine x 2)     Status: None (Preliminary result)   Collection Time: 02/24/23  5:18 PM   Specimen: BLOOD RIGHT ARM  Result Value Ref Range Status   Specimen Description   Final    BLOOD RIGHT ARM Performed at [facility], [address], [place], [place] [address]    Special Requests   Final    BOTTLES DRAWN  AEROBIC ONLY Blood Culture adequate volume Performed at [facility], [address] [place], [place] [address]    Culture   Final    NO GROWTH < 24 HOURS Performed at [facility], [address] [place], [place] [address]    Report Status PENDING  Incomplete     Labs: BNP (last 3 results) Recent Labs    11/07/22 2000 12/19/22 2333 12/20/22 1456  BNP 72.6 649.5* 549.0*   Basic Metabolic Panel: Recent Labs  Lab 02/24/23 1155 02/25/23 0436 02/26/23 0425  NA 138 138 137  K 3.0* 3.1* 3.7  CL 108 109 112*  CO2 19* 20* 19*  GLUCOSE 130* 91 82  BUN 47* 41* 38*  CREATININE 2.60* 2.29* 2.10*  CALCIUM 8.8* 8.6* 8.6*   Liver Function Tests: Recent Labs  Lab 02/24/23  1155  AST 24  ALT 14  ALKPHOS 47  BILITOT 0.6  PROT 8.3*  ALBUMIN 3.3*   No results for input(s): "LIPASE", "AMYLASE" in the last 168 hours. No results for input(s): "AMMONIA" in the last 168 hours. CBC: Recent Labs  Lab 02/24/23 1155 02/25/23 0436  WBC 9.5 5.5  NEUTROABS 8.3*  --   HGB 10.7* 9.5*  HCT 34.7* 31.2*  MCV 84.8 87.6  PLT 125* 115*   Cardiac Enzymes: No results for input(s): "CKTOTAL", "CKMB", "CKMBINDEX", "TROPONINI" in the last 168 hours. BNP: Invalid input(s): "POCBNP" CBG: No results for input(s): "GLUCAP" in the last 168 hours. D-Dimer No results for input(s): "DDIMER" in the last 72 hours. Hgb A1c No results for input(s): "HGBA1C" in the last 72 hours. Lipid Profile No results for input(s): "CHOL", "HDL", "LDLCALC", "TRIG", "CHOLHDL", "LDLDIRECT" in the last 72 hours. Thyroid function studies No results for input(s): "TSH", "T4TOTAL", "T3FREE", "THYROIDAB" in the last 72 hours.  Invalid input(s): "FREET3" Anemia work up No results for input(s): "VITAMINB12", "FOLATE", "FERRITIN", "TIBC", "IRON", "RETICCTPCT" in the last 72 hours. Urinalysis    Component Value Date/Time   COLORURINE YELLOW 02/25/2023 1042   APPEARANCEUR CLEAR 02/25/2023 1042    LABSPEC 1.018 02/25/2023 1042   PHURINE 6.0 02/25/2023 1042   GLUCOSEU 50 (A) 02/25/2023 1042   HGBUR LARGE (A) 02/25/2023 1042   BILIRUBINUR NEGATIVE 02/25/2023 1042   KETONESUR NEGATIVE 02/25/2023 1042   PROTEINUR 100 (A) 02/25/2023 1042   UROBILINOGEN 0.2 07/14/2010 2041   NITRITE NEGATIVE 02/25/2023 1042   LEUKOCYTESUR NEGATIVE 02/25/2023 1042   Sepsis Labs Recent Labs  Lab 02/24/23 1155 02/25/23 0436  WBC 9.5 5.5   Microbiology Recent Results (from the past 240 hour(s))  Blood Culture (routine x 2)     Status: None (Preliminary result)   Collection Time: 02/24/23 11:50 AM   Specimen: BLOOD  Result Value Ref Range Status   Specimen Description   Final    BLOOD SITE NOT SPECIFIED Performed at [facility], [address] [place], [place] [address]    Special Requests   Final    BOTTLES DRAWN AEROBIC ONLY Blood Culture adequate volume Performed at [facility], [address], [place], [place] [address]    Culture   Final    NO GROWTH < 24 HOURS Performed at [facility], [address], [place], [place] [address]    Report Status PENDING  Incomplete  Blood Culture (routine x 2)     Status: None (Preliminary result)   Collection Time: 02/24/23  5:18 PM   Specimen: BLOOD RIGHT ARM  Result Value Ref Range Status   Specimen Description   Final    BLOOD RIGHT ARM Performed at [facility], [address] [place], [place] [address]    Special Requests   Final    BOTTLES DRAWN AEROBIC ONLY Blood Culture adequate volume Performed at [facility], [address], [place], [place] [address]    Culture   Final    NO GROWTH < 24 HOURS Performed at [facility], [address], [place], [place] [address]    Report Status PENDING  Incomplete     Time coordinating discharge: Over 35 minutes  SIGNED:   Marinda Elk, MD  [facility] 02/26/2023, 9:11 AM Pager   If 7PM-7AM,  please contact night-coverage [email or web address] Password [email or web address]

## 2023-02-27 NOTE — Progress Notes (Unsigned)
Cardiology Office Note:  .   Date:  02/27/2023  ID:  [name], DOB 05-10-1951, MRN [id number] PCP: Diamantina Providence, FNP  [facility] Providers Cardiologist:  None { Click to update primary MD,subspecialty MD or APP then REFRESH:1}   History of Present Illness: .   Michaela Baker is a 72 y.o. female seen by [place] cardiology, hypothyroidism,  hx of HTN,  CKD stage III, hx DVT on eliquis,  HFpEF since 2013, lexiscan stress test with no ischemia or scar did have PVCS; normal LVEF 01/2020. She had a holter with burden of  22%. Was started on BB. She continue to have dyspnea and persistent PVCs. They discussed at that time an EP referral for ablation 03/24/2020. She saw Dr. Johney Frame that November, her PVC burden was low then. Plan was to encourage weightloss/ CPAP compliance. Further she did not wish to consider AADs or ablation.  A referral  was sent in June by Achille Rich PA for "nonspecific CP". She was seen in the ED at that time. She was given 324 mg of asa load and started on aspirin. Vitals were normal. She was noted to be tachypneic without wheezing. EKG did not show acute ST-T changes. She was given fentanyl. Her trop was unremarkable. No ACS. She was recommended to see cardiology outpt.  She was recently in the hospital over the weekend 2/2 cellulitis of the L foot and sepsis. No DM2. Acute metabolic encephalopathy as well.   Today,    ROS:  per HPI otherwise negative   Studies Reviewed: .        *** Risk Assessment/Calculations:        Physical Exam:   VS:  There were no vitals taken for this visit.   Wt Readings from Last 3 Encounters:  02/25/23 177 lb 11.1 oz (80.6 kg)  12/25/22 179 lb 14.2 oz (81.6 kg)  11/13/22 150 lb 2.1 oz (68.1 kg)    GEN: Well nourished, well developed in no acute distress NECK: No JVD; No carotid bruits CARDIAC: ***RRR, no murmurs, rubs, gallops RESPIRATORY:  Clear to auscultation without rales, wheezing or rhonchi  ABDOMEN: Soft,  non-tender, non-distended EXTREMITIES:  No edema; No deformity   ASSESSMENT AND PLAN: .   CP *** resolved -stop asa while on eliquis and no recent PCI  HTN - continue norvasc 10 mg daily  Hx of Frequent PVCs - non ischemic - continue BB  HFPEF - euvolemic - on farxiga 10 mg daily - cont lasix 40 mg daily  CKD stage IIIb Bsl crt 1.3-1.6  DVT - on eliquis  Hypothyroidism - on synthroid  HLD - continue crestor 5 mg daily    {Are you ordering a CV Procedure (e.g. stress test, cath, DCCV, TEE, etc)?   Press F2        :[id number]}  Dispo: ***  Signed, Maisie Fus, MD

## 2023-03-01 ENCOUNTER — Ambulatory Visit: Payer: 59 | Attending: Internal Medicine | Admitting: Internal Medicine

## 2023-03-01 ENCOUNTER — Encounter: Payer: Self-pay | Admitting: Internal Medicine

## 2023-03-01 VITALS — BP 114/66 | HR 59 | Ht 65.0 in | Wt 175.8 lb

## 2023-03-01 DIAGNOSIS — L97311 Non-pressure chronic ulcer of right ankle limited to breakdown of skin: Secondary | ICD-10-CM | POA: Diagnosis not present

## 2023-03-01 DIAGNOSIS — I83013 Varicose veins of right lower extremity with ulcer of ankle: Secondary | ICD-10-CM | POA: Diagnosis not present

## 2023-03-01 LAB — CULTURE, BLOOD (ROUTINE X 2)
Culture: NO GROWTH
Special Requests: ADEQUATE
Special Requests: ADEQUATE

## 2023-03-01 NOTE — Patient Instructions (Addendum)
Medication Instructions:  -STOP Aspirin 81mg     *If you need a refill on your cardiac medications before your next appointment, please call your pharmacy*  Test/Procedures: Your physician has requested that you have an ankle brachial index (ABI). During this test an ultrasound and blood pressure cuff are used to evaluate the arteries that supply the legs with blood. Allow thirty minutes for this exam. There are no restrictions or special instructions.       Follow-Up: At [facility], you and your health needs are our priority.  As part of our continuing mission to provide you with exceptional heart care, we have created designated Provider Care Teams.  These Care Teams include your primary Cardiologist (physician) and Advanced Practice Providers (APPs -  Physician Assistants and Nurse Practitioners) who all work together to provide you with the care you need, when you need it.  We recommend signing up for the patient portal called "MyChart".  Sign up information is provided on this After Visit Summary.  MyChart is used to connect with patients for Virtual Visits (Telemedicine).  Patients are able to view lab/test results, encounter notes, upcoming appointments, etc.  Non-urgent messages can be sent to your provider as well.   To learn more about what you can do with MyChart, go to [email or web address].    Your next appointment:   3 month(s)  The format for your next appointment:   In Person  Provider:   Maisie Fus, MD

## 2023-03-02 ENCOUNTER — Telehealth: Payer: Self-pay

## 2023-03-02 NOTE — Telephone Encounter (Addendum)
Test requested post visit. ABI scheduled for 03/20/23 at 2pm and appointment reminder sent out to pt with date and time. Tried calling pt to inform her of test but no answer. Lvmtcb. Will follow up

## 2023-03-03 NOTE — Telephone Encounter (Signed)
Spoke with daughter. She said she will be bringing Mrs. Michaela Baker to her appointment and will inform her of the date and time for the ABI.

## 2023-03-07 ENCOUNTER — Other Ambulatory Visit: Payer: Self-pay | Admitting: Nurse Practitioner

## 2023-03-07 DIAGNOSIS — E039 Hypothyroidism, unspecified: Secondary | ICD-10-CM

## 2023-03-20 ENCOUNTER — Other Ambulatory Visit: Payer: Self-pay | Admitting: Internal Medicine

## 2023-03-20 ENCOUNTER — Ambulatory Visit (HOSPITAL_COMMUNITY)
Admission: RE | Admit: 2023-03-20 | Discharge: 2023-03-20 | Disposition: A | Discharge status: Home / Self Care | Payer: 59 | Source: Ambulatory Visit | Attending: Internal Medicine | Admitting: Internal Medicine

## 2023-03-20 DIAGNOSIS — L97311 Non-pressure chronic ulcer of right ankle limited to breakdown of skin: Secondary | ICD-10-CM | POA: Insufficient documentation

## 2023-03-20 DIAGNOSIS — I83013 Varicose veins of right lower extremity with ulcer of ankle: Secondary | ICD-10-CM | POA: Diagnosis present

## 2023-03-20 DIAGNOSIS — I739 Peripheral vascular disease, unspecified: Secondary | ICD-10-CM | POA: Diagnosis present

## 2023-03-20 LAB — VAS US ABI WITH/WO TBI

## 2023-04-14 IMAGING — MG MM DIGITAL SCREENING BILAT W/ TOMO AND CAD
8 of 16 series · 8 of 40 positions shown · non-contrast
Comparison: Previous exam(s).

ACR Breast Density Category a: The breast tissue is almost entirely
fatty.

CLINICAL DATA: Screening.

EXAM:
DIGITAL SCREENING BILATERAL MAMMOGRAM WITH TOMOSYNTHESIS AND CAD
TECHNIQUE: Bilateral screening digital craniocaudal and mediolateral oblique
mammograms were obtained. Bilateral screening digital breast
tomosynthesis was performed. The images were evaluated with
computer-aided detection.

[R MLO synth-2D]
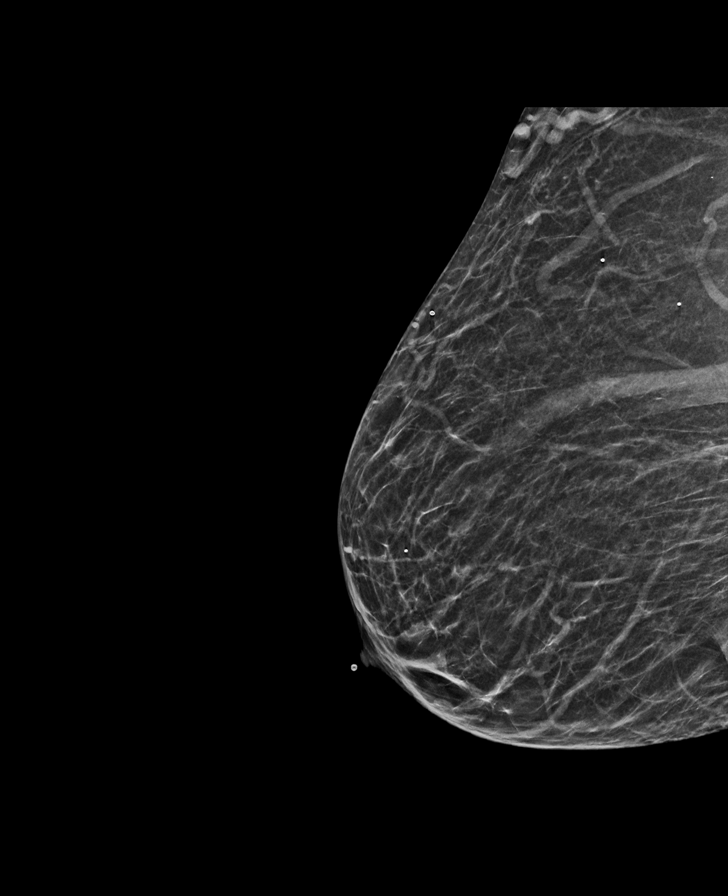

[L MLO synth-2D (1 of 3)]
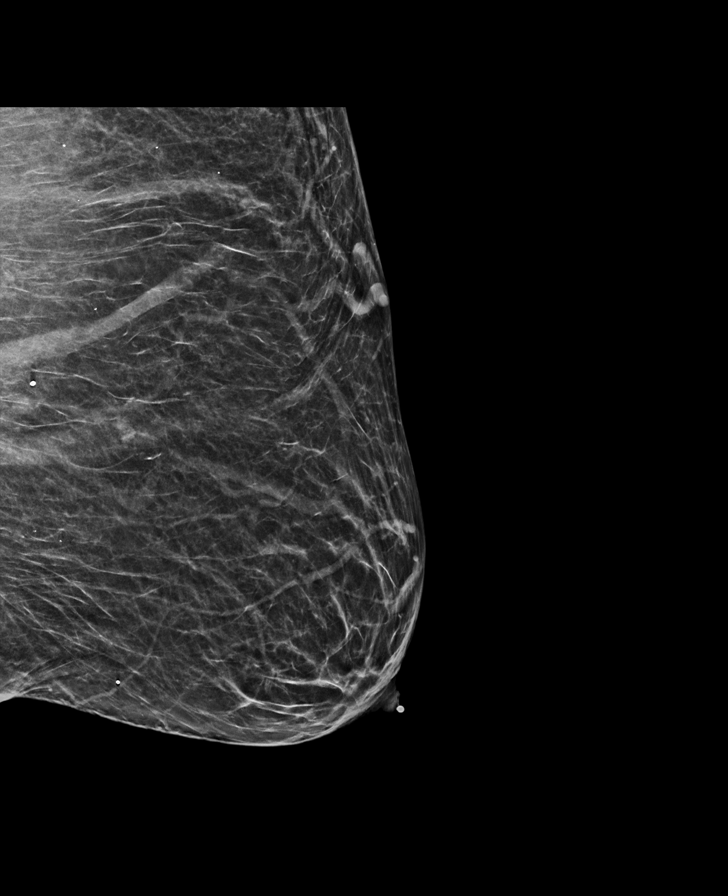

[R CC synth-2D (1 of 2)]
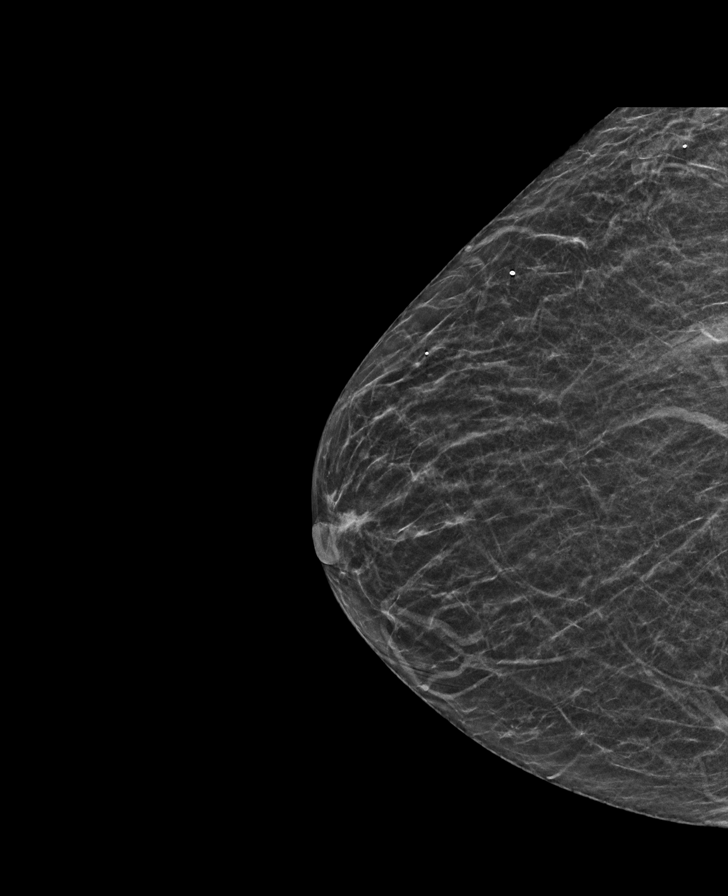

[L MLO synth-2D (2 of 3)]
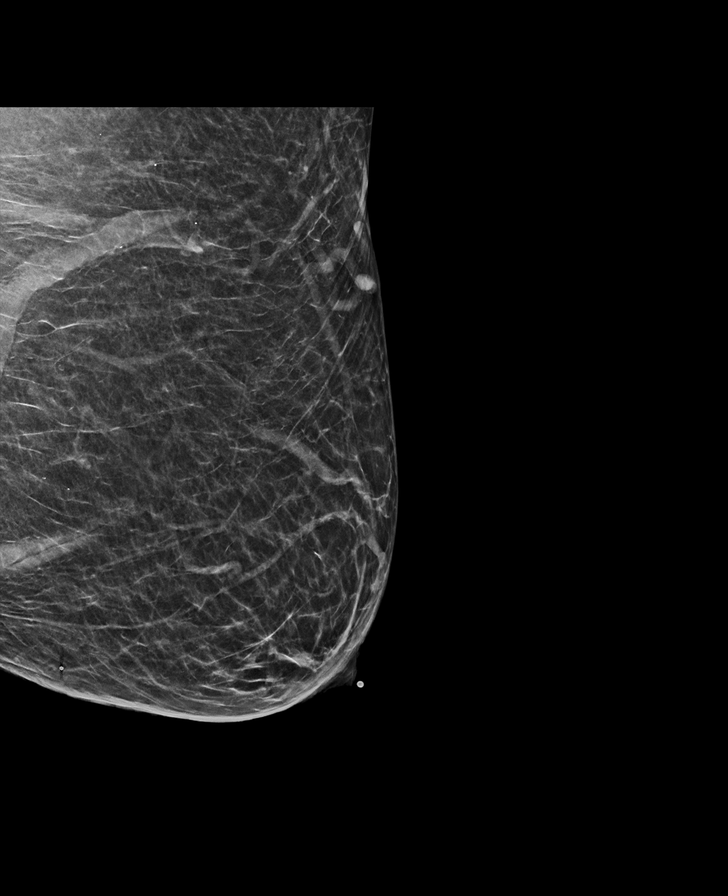

[L CC synth-2D]
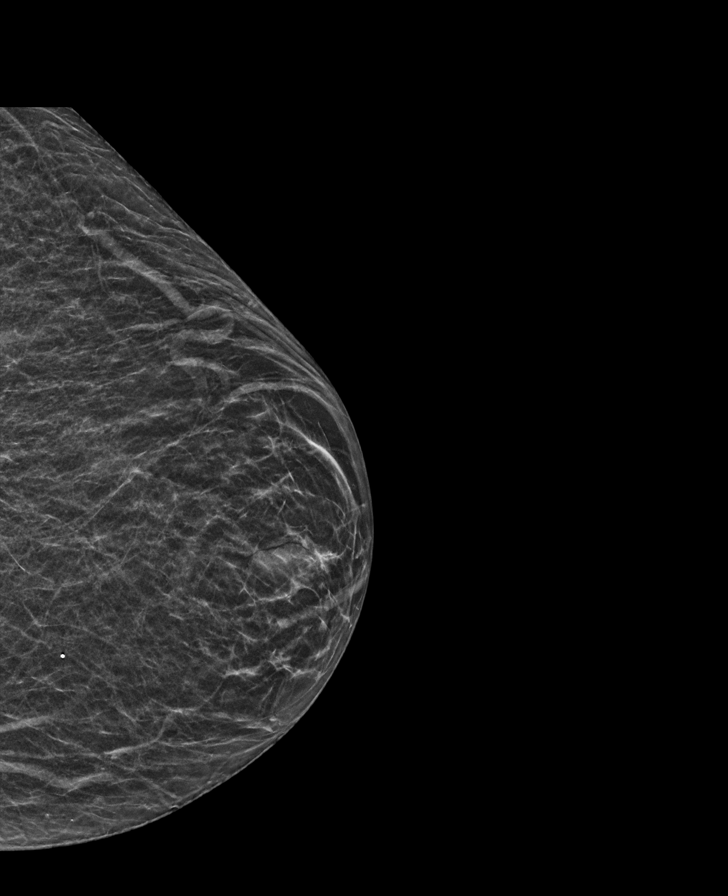

[L MLO synth-2D (3 of 3)]
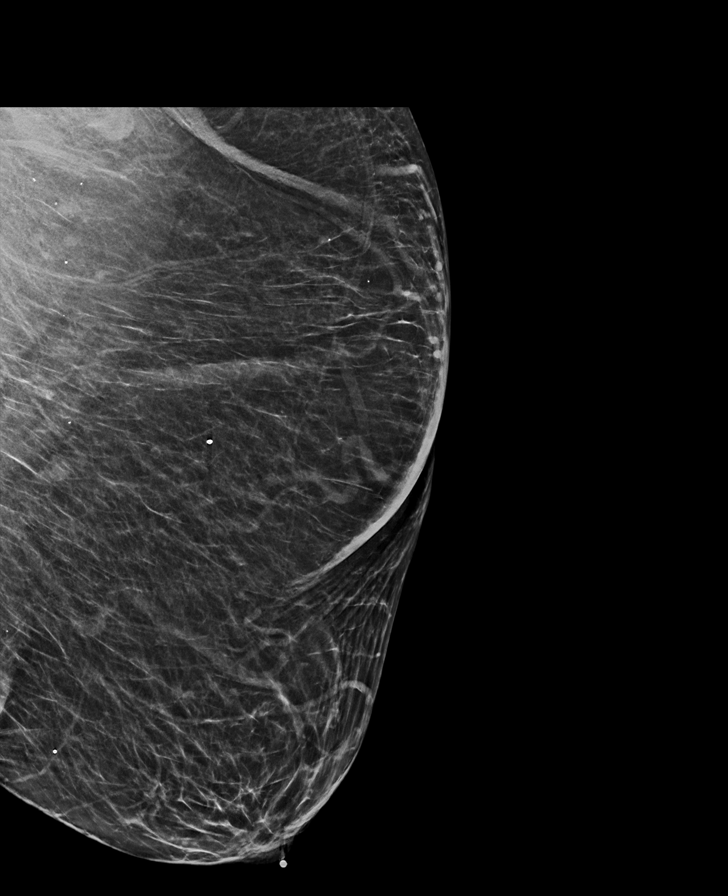

[R CC synth-2D (2 of 2)]
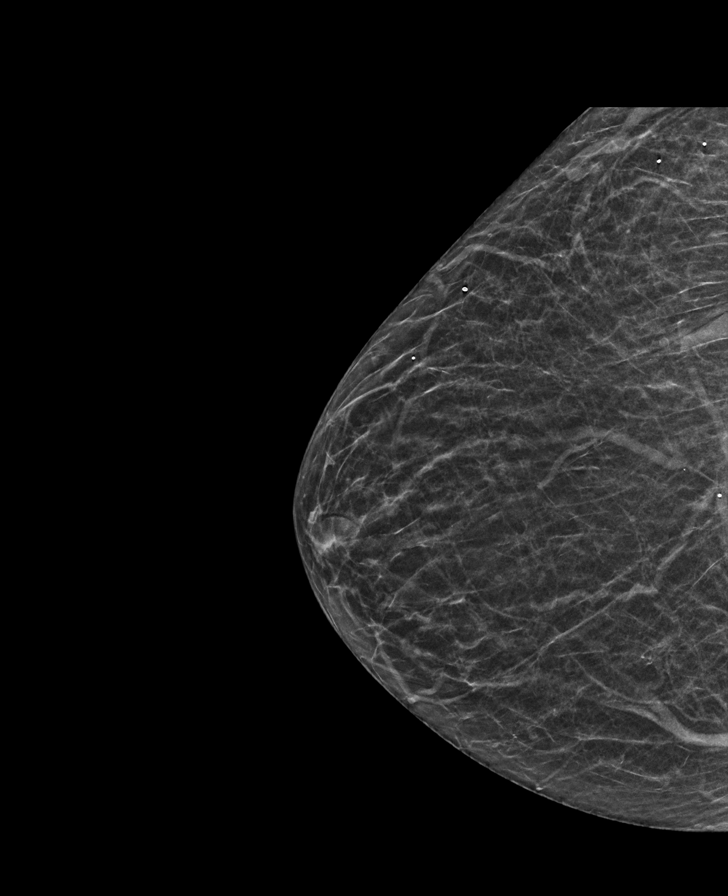

[R MLO tomo · tomo slice 23/45.0]
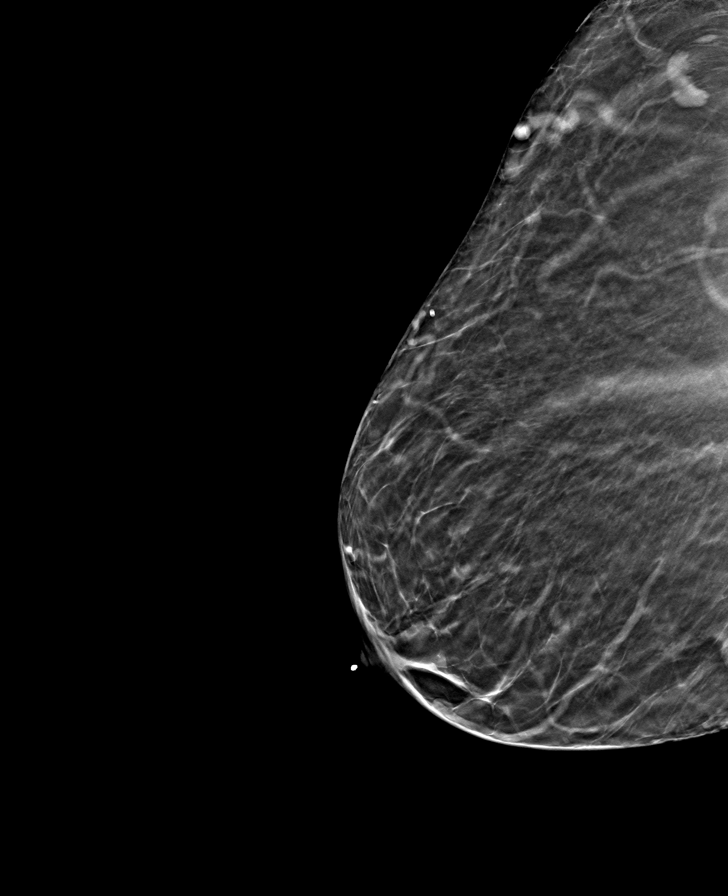

[8 of 40 positions shown; findings below may reference images not displayed]

FINDINGS: There are no findings suspicious for malignancy. The images were
evaluated with computer-aided detection.
IMPRESSION: No mammographic evidence of malignancy. A result letter of this
screening mammogram will be mailed directly to the patient.

RECOMMENDATION:
Screening mammogram in one year. (Code:JP-J-DD5)

BI-RADS CATEGORY  1: Negative.

## 2023-06-06 ENCOUNTER — Ambulatory Visit: Payer: 59 | Attending: Internal Medicine | Admitting: Internal Medicine

## 2023-06-06 NOTE — Progress Notes (Deleted)
 Cardiology Office Note:  .   Date:  06/06/2023  ID:  Michaela Baker, DOB 26-Oct-1950, MRN [id number] PCP: Lenon Nell SAILOR, FNP  [facility] Providers Cardiologist:  Alvan Ronal BRAVO, MD    History of Present Illness: .   Michaela Baker is a 73 y.o. female seen by [facility] cardiology, hypothyroidism,  hx of HTN,  CKD stage III, hx DVT on eliquis ,  HFpEF since 2013, lexiscan stress test with no ischemia or scar did have PVCS; normal LVEF 01/2020. She had a holter with burden of  22%. Was started on BB. She continue to have dyspnea and persistent PVCs. They discussed at that time an EP referral for ablation 03/24/2020. She saw Dr. Kelsie that November, her PVC burden was low then. Plan was to encourage weightloss/ CPAP compliance. Further she did not wish to consider AADs or ablation.  A referral  was sent in June by Carlo Cornish PA for nonspecific CP. She was seen in the ED at that time. She was given 324 mg of asa load and started on aspirin . Vitals were normal. She was noted to be tachypneic without wheezing. EKG did not show acute ST-T changes. She was given fentanyl . Her trop was unremarkable. No ACS. She was recommended to see cardiology outpt.  She was recently in the hospital over the weekend 2/2 cellulitis of the L foot and sepsis. No DM2. Acute metabolic encephalopathy as well. EKG on 02/24/2023 shows sinus rhythm with 1st degree AV block.  Today, she comes in after her hospitalization. She states this is for follow up for HFpEF. She is on lasix  40 mg twice daily. She is deconditioned. Working on new scale. She is 175 pounds today. Shee denies angina, dyspnea on exertion, lower extremity edema, PND or orthopnea.    ROS:  per HPI otherwise negative   Studies Reviewed: SABRA      TTE  1. Left ventricular ejection fraction, by estimation, is 60 to 65%. The left ventricle has normal function. The left ventricle has no regional wall motion abnormalities. Left ventricular diastolic parameters  are indeterminate.   2. Right ventricular systolic function is normal. The right ventricular size is normal. There is mildly elevated pulmonary artery systolic pressure. The estimated right ventricular systolic pressure is 40.7 mmHg.   3. The mitral valve is grossly normal. Mild mitral valve regurgitation.  No evidence of mitral stenosis.   4. The aortic valve is normal in structure. Aortic valve regurgitation is not visualized. No aortic stenosis is present.   Risk Assessment/Calculations:        Physical Exam:   VS:   There were no vitals filed for this visit.   Wt Readings from Last 3 Encounters:  03/01/23 175 lb 12.8 oz (79.7 kg)  02/25/23 177 lb 11.1 oz (80.6 kg)  12/25/22 179 lb 14.2 oz (81.6 kg)    Physical Exam Gen: well appearing, sitting in a wheel chair Neuro: alert and oriented CV: r,r,r no murmurs.  Pulm: CLAB Abd: non distended Ext: No LE edema. R foot is bandaged  Skin: warm and well perfused Psych: normal mood  ASSESSMENT AND PLAN: .   HFPEF Pulmonary HTN; has OSA - euvolemic, dry weight 175 pounds***.  - on farxiga  10 mg daily - cont lasix  40 mg BID  HTN - continue norvasc  10 mg daily  Hx of Frequent PVCs - non ischemic - continue BB  CKD stage IIIb Bsl crt 1.3-1.6; increased since hospitalization. Last 2.1 GFR 24 ***  DVT S/p laser ablation R great saphenous vein PAD - on eliquis  -saw Dr. Eliza and podiatry   Hypothyroidism - on synthroid   HLD - continue crestor  5 mg daily       Dispo: Follow up 3 months  Signed, Aqil Goetting, Ronal BRAVO, MD

## 2023-06-27 ENCOUNTER — Emergency Department (HOSPITAL_COMMUNITY): Payer: 59

## 2023-06-27 ENCOUNTER — Observation Stay (HOSPITAL_COMMUNITY)
Admission: EM | Admit: 2023-06-27 | Discharge: 2023-06-28 | Disposition: A | Discharge status: Home / Self Care | Payer: 59 | Attending: Internal Medicine | Admitting: Internal Medicine

## 2023-06-27 ENCOUNTER — Other Ambulatory Visit: Payer: Self-pay

## 2023-06-27 ENCOUNTER — Observation Stay (HOSPITAL_COMMUNITY): Payer: 59

## 2023-06-27 ENCOUNTER — Encounter (HOSPITAL_COMMUNITY): Payer: Self-pay | Admitting: Emergency Medicine

## 2023-06-27 DIAGNOSIS — Z79899 Other long term (current) drug therapy: Secondary | ICD-10-CM | POA: Diagnosis not present

## 2023-06-27 DIAGNOSIS — R0781 Pleurodynia: Principal | ICD-10-CM | POA: Insufficient documentation

## 2023-06-27 DIAGNOSIS — N179 Acute kidney failure, unspecified: Secondary | ICD-10-CM | POA: Diagnosis not present

## 2023-06-27 DIAGNOSIS — E039 Hypothyroidism, unspecified: Secondary | ICD-10-CM | POA: Diagnosis not present

## 2023-06-27 DIAGNOSIS — Z7901 Long term (current) use of anticoagulants: Secondary | ICD-10-CM | POA: Insufficient documentation

## 2023-06-27 DIAGNOSIS — Z20822 Contact with and (suspected) exposure to covid-19: Secondary | ICD-10-CM | POA: Insufficient documentation

## 2023-06-27 DIAGNOSIS — I5030 Unspecified diastolic (congestive) heart failure: Secondary | ICD-10-CM | POA: Insufficient documentation

## 2023-06-27 DIAGNOSIS — E785 Hyperlipidemia, unspecified: Secondary | ICD-10-CM | POA: Insufficient documentation

## 2023-06-27 DIAGNOSIS — N184 Chronic kidney disease, stage 4 (severe): Secondary | ICD-10-CM | POA: Diagnosis not present

## 2023-06-27 DIAGNOSIS — Z86718 Personal history of other venous thrombosis and embolism: Secondary | ICD-10-CM | POA: Diagnosis not present

## 2023-06-27 DIAGNOSIS — R079 Chest pain, unspecified: Principal | ICD-10-CM

## 2023-06-27 DIAGNOSIS — D631 Anemia in chronic kidney disease: Secondary | ICD-10-CM | POA: Insufficient documentation

## 2023-06-27 DIAGNOSIS — I130 Hypertensive heart and chronic kidney disease with heart failure and stage 1 through stage 4 chronic kidney disease, or unspecified chronic kidney disease: Secondary | ICD-10-CM | POA: Diagnosis not present

## 2023-06-27 DIAGNOSIS — R0602 Shortness of breath: Secondary | ICD-10-CM | POA: Diagnosis present

## 2023-06-27 LAB — CBC
HCT: 34.5 % — ABNORMAL LOW (ref 36.0–46.0)
Hemoglobin: 10.2 g/dL — ABNORMAL LOW (ref 12.0–15.0)
MCH: 26.6 pg (ref 26.0–34.0)
MCHC: 29.6 g/dL — ABNORMAL LOW (ref 30.0–36.0)
MCV: 89.8 fL (ref 80.0–100.0)
Platelets: 157 10*3/uL (ref 150–400)
RBC: 3.84 MIL/uL — ABNORMAL LOW (ref 3.87–5.11)
RDW: 13.6 % (ref 11.5–15.5)
WBC: 4 10*3/uL (ref 4.0–10.5)
nRBC: 0 % (ref 0.0–0.2)

## 2023-06-27 LAB — BASIC METABOLIC PANEL
Anion gap: 10 (ref 5–15)
BUN: 36 mg/dL — ABNORMAL HIGH (ref 8–23)
CO2: 22 mmol/L (ref 22–32)
Calcium: 9.4 mg/dL (ref 8.9–10.3)
Chloride: 106 mmol/L (ref 98–111)
Creatinine, Ser: 2.83 mg/dL — ABNORMAL HIGH (ref 0.44–1.00)
GFR, Estimated: 17 mL/min — ABNORMAL LOW (ref >60)
Glucose, Bld: 96 mg/dL (ref 70–99)
Potassium: 3.9 mmol/L (ref 3.5–5.1)
Sodium: 138 mmol/L (ref 135–145)

## 2023-06-27 LAB — RESP PANEL BY RT-PCR (RSV, FLU A&B, COVID)  RVPGX2
Influenza A by PCR: NEGATIVE
Influenza B by PCR: NEGATIVE
Resp Syncytial Virus by PCR: NEGATIVE
SARS Coronavirus 2 by RT PCR: NEGATIVE

## 2023-06-27 LAB — D-DIMER, QUANTITATIVE: D-Dimer, Quant: 2.18 ug{FEU}/mL — ABNORMAL HIGH (ref 0.00–0.50)

## 2023-06-27 LAB — BRAIN NATRIURETIC PEPTIDE: B Natriuretic Peptide: 150.1 pg/mL — ABNORMAL HIGH (ref 0.0–100.0)

## 2023-06-27 LAB — APTT: aPTT: <24 s — ABNORMAL LOW (ref 24–36)

## 2023-06-27 LAB — PROTIME-INR
INR: 1.2 (ref 0.8–1.2)
Prothrombin Time: 15.4 s — ABNORMAL HIGH (ref 11.4–15.2)

## 2023-06-27 LAB — HEPARIN LEVEL (UNFRACTIONATED): Heparin Unfractionated: >2 [IU]/mL — ABNORMAL HIGH (ref 0.30–0.70)

## 2023-06-27 LAB — TROPONIN I (HIGH SENSITIVITY)
Troponin I (High Sensitivity): 14 ng/L (ref <18)
Troponin I (High Sensitivity): 11 ng/L (ref <18)

## 2023-06-27 MED ORDER — OXYCODONE HCL 5 MG PO TABS: 5.0000 mg | ORAL_TABLET | Freq: Four times a day (QID) | ORAL | Status: DC | PRN

## 2023-06-27 MED ORDER — LEVOTHYROXINE SODIUM 25 MCG PO TABS: 25.0000 ug | ORAL_TABLET | Freq: Every day | ORAL | Status: DC

## 2023-06-27 MED ORDER — AMLODIPINE BESYLATE 5 MG PO TABS
10.0000 mg | ORAL_TABLET | Freq: Every day | ORAL | Status: DC
  Administered 2023-06-28: 10 mg via ORAL
  Filled 2023-06-27: qty 2

## 2023-06-27 MED ORDER — LEVOTHYROXINE SODIUM 25 MCG PO TABS
25.0000 ug | ORAL_TABLET | Freq: Every day | ORAL | Status: DC
  Administered 2023-06-28: 25 ug via ORAL
  Filled 2023-06-27: qty 1

## 2023-06-27 MED ORDER — AZITHROMYCIN 250 MG PO TABS
500.0000 mg | ORAL_TABLET | Freq: Every day | ORAL | Status: DC
  Administered 2023-06-27: 500 mg via ORAL
  Filled 2023-06-27: qty 2

## 2023-06-27 MED ORDER — SODIUM CHLORIDE 0.9 % IV SOLN
2.0000 g | INTRAVENOUS | Status: DC
  Administered 2023-06-27: 2 g via INTRAVENOUS
  Filled 2023-06-27: qty 20

## 2023-06-27 MED ORDER — POLYETHYLENE GLYCOL 3350 17 G PO PACK
17.0000 g | PACK | Freq: Every day | ORAL | Status: DC
  Administered 2023-06-27 – 2023-06-28 (×2): 17 g via ORAL
  Filled 2023-06-27 (×2): qty 1

## 2023-06-27 MED ORDER — HYDROCODONE-ACETAMINOPHEN 5-325 MG PO TABS
1.0000 | ORAL_TABLET | Freq: Once | ORAL | Status: AC
  Administered 2023-06-27: 1 via ORAL
  Filled 2023-06-27: qty 1

## 2023-06-27 MED ORDER — METOPROLOL SUCCINATE ER 50 MG PO TB24
50.0000 mg | ORAL_TABLET | Freq: Every day | ORAL | Status: DC
  Administered 2023-06-28: 50 mg via ORAL
  Filled 2023-06-27: qty 1

## 2023-06-27 MED ORDER — HYDRALAZINE HCL 25 MG PO TABS
50.0000 mg | ORAL_TABLET | Freq: Every day | ORAL | Status: DC
  Administered 2023-06-28: 50 mg via ORAL
  Filled 2023-06-27: qty 2

## 2023-06-27 MED ORDER — ACETAMINOPHEN 325 MG PO TABS: 650.0000 mg | ORAL_TABLET | Freq: Four times a day (QID) | ORAL | Status: DC | PRN

## 2023-06-27 MED ORDER — ACETAMINOPHEN 650 MG RE SUPP: 650.0000 mg | Freq: Four times a day (QID) | RECTAL | Status: DC | PRN

## 2023-06-27 MED ORDER — ROSUVASTATIN CALCIUM 5 MG PO TABS
5.0000 mg | ORAL_TABLET | Freq: Every day | ORAL | Status: DC
  Administered 2023-06-28: 5 mg via ORAL
  Filled 2023-06-27: qty 1

## 2023-06-27 MED ORDER — HEPARIN (PORCINE) 25000 UT/250ML-% IV SOLN
1250.0000 [IU]/h | INTRAVENOUS | Status: DC
  Administered 2023-06-27: 1250 [IU]/h via INTRAVENOUS
  Filled 2023-06-27: qty 250

## 2023-06-27 NOTE — ED Notes (Signed)
Patient resting in bed breathing eyes closed at this time

## 2023-06-27 NOTE — ED Provider Triage Note (Signed)
Emergency Medicine Provider Triage Evaluation Note  Michaela Baker , a 73 y.o. female  was evaluated in triage.  Pt complains of left lower chest wall pain associated with SOB on exertion. Onset yesterday. Has been taking medication.   Review of Systems  Positive: CP, SOB Negative: Abdominal pain  Physical Exam  BP (!) 102/51 (BP Location: Left Arm)   Pulse 76   Temp 97.8 F (36.6 C) (Oral)   Resp 18   SpO2 92%  Gen:   Awake, no distress   Resp:  Normal effort  MSK:   Moves extremities without difficulty  Other:  Bilateral lower extremity swellling  Medical Decision Making  Medically screening exam initiated at 12:01 PM.  Appropriate orders placed.  SCOTT FIX was informed that the remainder of the evaluation will be completed by another provider, this initial triage assessment does not replace that evaluation, and the importance of remaining in the ED until their evaluation is complete.     Smitty Knudsen, PA-C 06/27/23 1203

## 2023-06-27 NOTE — ED Triage Notes (Signed)
Pt here  from home with home with c/o left sided chest pain hx of chf and was placed on O2 at home last time she was in the hospital

## 2023-06-27 NOTE — Progress Notes (Signed)
PHARMACY - ANTICOAGULATION CONSULT NOTE  Pharmacy Consult for Heparin Indication: VTE treatment  No Known Allergies  Patient Measurements: Height: 5\' 5"  (165.1 cm) Weight: 77.3 kg (170 lb 6.4 oz) IBW/kg (Calculated) : 57 Heparin Dosing Weight: 73 kg  Vital Signs: Temp: 98.9 F (37.2 C) (01/28 1644) Temp Source: Oral (01/28 1644) BP: 117/54 (01/28 1400) Pulse Rate: 71 (01/28 1400)  Labs: Recent Labs    06/27/23 1209 06/27/23 1504  HGB 10.2*  --   HCT 34.5*  --   PLT 157  --   CREATININE 2.83*  --   TROPONINIHS 14 11    CrCl cannot be calculated (Unknown ideal weight.).   Medical History: Past Medical History:  Diagnosis Date   CHF (congestive heart failure) (HCC)    Hypertension    Hypothyroidism 03/29/2017   Obesity (BMI 30-39.9)    Premature ventricular contractions     Medications:  Scheduled:   HYDROcodone-acetaminophen  1 tablet Oral Once   Infusions:   Assessment: 78 yoF presented to ED on 1/28 with chest pain.  Pharmacy is consulted to dose heparin while PTA apixaban for hx of DVT is on hold.  Echo and VQ scan pending.    PTA apixaban 5mg  BID, last filled on 06/05/23.  Last dose on 1/28 at 10am.  Baseline coags ordered.  Expect heparin levels will be falsely elevated d/t recent DOAC use and will use aPTT for monitoring/dosing.   CBC:  Hgb 10.2 (near baseline 8-10 range), Plt WNL (baseline 100-200s) SCr 2.8, elevated above previous levels (2.1, 1.6)  D-dimer 2.18 (note AKI)   Goal of Therapy:  Heparin level 0.3-0.7 units/ml aPTT 66-102 seconds Monitor platelets by anticoagulation protocol: Yes   Plan:  No bolus due to recent DOAC use. Start heparin IV infusion at 1250 units/hr at 22:00 APTT in 8 hours after starting Daily aPTT, heparin level, and CBC Follow up labs, imaging.     Lynann Beaver PharmD, BCPS [facility] [phone] 06/27/2023 5:22 PM

## 2023-06-27 NOTE — ED Notes (Signed)
Provided patient with beverage at this time

## 2023-06-27 NOTE — ED Notes (Signed)
Patient stated she does not need her pain medication at this moment, will let me know once she needs it.

## 2023-06-27 NOTE — H&P (Signed)
History and Physical    [name]:[id number] DOB: June 15, 1950 DOA: 06/27/2023  PCP: System, Provider Not In  Patient coming from: home  I have personally briefly reviewed patient's old medical records in [facility]  Chief Complaint: pleuritic CP, doe  HPI: Michaela Baker is Baird Polinski 73 y.o. female with medical history significant of bilateral lower extremity DVT's on eliquis, hypertension, diastolic heart failure, hypothyroidism and other medical issues here with L sided chest pain.  She notes her symptoms started last night with left sided pain. Pain is described as "kind of sharp", comes and goes.  Moving makes it worse.  Worse with deep breaths.  She denies any other symptoms other than worsening dyspnea on exertion.  She notes this started yesterday and has gotten progressively worse today.  She denies fevers, chills, cough (she does occasionally brings up some mucus).  She denies CP, nausea, vomiting, abdominal pain.  She denies lower extremity swelling.  She notes she takes her eliquis daily, denies missed doses.  Denies smoking or drinking.  Lives with family who is [employer] CNA, occasionally uses cane/walker to ambulate.  ED Course: labs, imaging.  With elevated d dimer, concern for PE -> start heparin, admit to hospitalist while awaiting VQ scan.   Review of Systems: As per HPI otherwise all other systems reviewed and are negative.  Past Medical History:  Diagnosis Date   CHF (congestive heart failure) (HCC)    Hypertension    Hypothyroidism 03/29/2017   Obesity (BMI 30-39.9)    Premature ventricular contractions     Past Surgical History:  Procedure Laterality Date   ABDOMINAL HYSTERECTOMY     CHOLECYSTECTOMY     COLONOSCOPY WITH PROPOFOL N/Michaela Baker 12/29/2021   Procedure: COLONOSCOPY WITH PROPOFOL;  Surgeon: Michaela Der, MD;  Location: [facility];  Service: Gastroenterology;  Laterality: N/Michaela Baker;   ENDOVENOUS ABLATION SAPHENOUS VEIN W/ LASER Right 01/26/2022   endovenous  laser ablation right greater saphenous vein by Cari Caraway MD   TONSILLECTOMY     TUBAL LIGATION      Social History  reports that she has never smoked. She has never used smokeless tobacco. She reports that she does not drink alcohol and does not use drugs.  No Known Allergies  Family History  Problem Relation Age of Onset   Diabetes Mother    Heart failure Mother    Kidney failure Mother    Cirrhosis Father    Kidney failure Sister    Diabetes Sister    Heart attack Sister    Breast cancer Neg Hx    Prior to Admission medications   Medication Sig Start Date End Date Taking? Authorizing Provider  acetaminophen (TYLENOL) 500 MG tablet Take 1,000 mg by mouth every 8 (eight) hours as needed (for pain).   Yes [provider]  amLODipine (NORVASC) 10 MG tablet Take 10 mg by mouth daily. 02/04/20  Yes [provider]  apixaban (ELIQUIS) 5 MG TABS tablet Take 2 tablets (10 mg total) by mouth 2 (two) times daily for 3 days, THEN 1 tablet (5 mg total) 2 (two) times daily. Patient taking differently: Take 20 mg by mouth in the morning and at bedtime 12/25/22 06/27/23 Yes Pokhrel, Laxman, MD  dapagliflozin propanediol (FARXIGA) 10 MG TABS tablet Take 10 mg by mouth daily.   Yes [provider]  furosemide (LASIX) 40 MG tablet Take 40 mg by mouth in the morning and at bedtime. 11/21/22  Yes [provider]  hydrALAZINE (APRESOLINE) 50  MG tablet Take 50 mg by mouth daily.   Yes [provider]  KERENDIA 10 MG TABS Take 10 mg by mouth daily.   Yes [provider]  levothyroxine (SYNTHROID, LEVOTHROID) 25 MCG tablet Take 25 mcg by mouth daily before breakfast. 02/05/15  Yes [provider]  metoprolol succinate (TOPROL-XL) 50 MG 24 hr tablet Take 50 mg by mouth daily. Take with or immediately following Michaela Baker meal.   Yes [provider]  Multiple Vitamins-Minerals (MULTIVITAMIN WITH MINERALS) tablet Take 1 tablet by mouth daily with  breakfast.   Yes [provider]  potassium chloride SA (KLOR-CON M) 20 MEQ tablet Take 1 tablet (20 mEq total) by mouth daily. 12/25/22  Yes Pokhrel, Laxman, MD  rosuvastatin (CRESTOR) 5 MG tablet Take 5 mg by mouth daily. 05/07/21  Yes [provider]    Physical Exam: Vitals:   06/27/23 1100 06/27/23 1400 06/27/23 1644 06/27/23 1729  BP: (!) 102/51 (!) 117/54    Pulse: 76 71    Resp: 18 18    Temp: 97.8 F (36.6 C)  98.9 F (37.2 C)   TempSrc: Oral  Oral   SpO2: 92% 97%    Weight:    77.3 kg  Height:    5\' 5"  (1.651 m)    Constitutional: NAD, calm, comfortable Vitals:   06/27/23 1100 06/27/23 1400 06/27/23 1644 06/27/23 1729  BP: (!) 102/51 (!) 117/54    Pulse: 76 71    Resp: 18 18    Temp: 97.8 F (36.6 C)  98.9 F (37.2 C)   TempSrc: Oral  Oral   SpO2: 92% 97%    Weight:    77.3 kg  Height:    5\' 5"  (1.651 m)   Eyes: EOMI ENMT: Mucous membranes are moist Neck: normal, supple Respiratory: clear to auscultation bilaterally, no wheezing, no crackles.  Cardiovascular: RRR Abdomen: no tenderness, no masses palpated.  Musculoskeletal: Good ROM, no contractures. Normal muscle tone.  Skin: heeling wound to R foot Neurologic: CN 2-12 grossly intact. Moving all extremiteis  Psychiatric: Normal judgment and insight. Alert and oriented x 3. Normal mood.   Labs on Admission: I have personally reviewed following labs and imaging studies  CBC: Recent Labs  Lab 06/27/23 1209  WBC 4.0  HGB 10.2*  HCT 34.5*  MCV 89.8  PLT 157    Basic Metabolic Panel: Recent Labs  Lab 06/27/23 1209  NA 138  K 3.9  CL 106  CO2 22  GLUCOSE 96  BUN 36*  CREATININE 2.83*  CALCIUM 9.4    GFR: Estimated Creatinine Clearance: 18.2 mL/min (Austyn Seier) (by C-G formula based on SCr of 2.83 mg/dL (H)).  Liver Function Tests: No results for input(s): "AST", "ALT", "ALKPHOS", "BILITOT", "PROT", "ALBUMIN" in the last 168 hours.  Urine analysis:    Component Value Date/Time    COLORURINE YELLOW 02/25/2023 1042   APPEARANCEUR CLEAR 02/25/2023 1042   LABSPEC 1.018 02/25/2023 1042   PHURINE 6.0 02/25/2023 1042   GLUCOSEU 50 (Michaela Loretto) 02/25/2023 1042   HGBUR LARGE (Clevie Prout) 02/25/2023 1042   BILIRUBINUR NEGATIVE 02/25/2023 1042   KETONESUR NEGATIVE 02/25/2023 1042   PROTEINUR 100 (Koa Zoeller) 02/25/2023 1042   UROBILINOGEN 0.2 07/14/2010 2041   NITRITE NEGATIVE 02/25/2023 1042   LEUKOCYTESUR NEGATIVE 02/25/2023 1042    Radiological Exams on Admission: DG Chest 2 View Result Date: 06/27/2023 CLINICAL DATA:  Chest pain EXAM: CHEST - 2 VIEW COMPARISON:  02/24/2023 FINDINGS: No pneumothorax. Normal cardiopericardial silhouette calcified tortuous aorta. There is  some increasing bandlike opacities in the bases, left greater than right. Favor atelectasis over infiltrate but recommend follow-up. No pneumothorax or edema. Surgical clips in the right upper quadrant of the abdomen. Degenerative changes along the spine. IMPRESSION: Mild lung base opacities are seen, left-greater-than-right but increased from previous. Atelectasis is favored over infiltrate but recommend follow-up. Electronically Signed   By: Karen Kays M.D.   On: 06/27/2023 13:03    EKG: Independently reviewed. sinus  Assessment/Plan Principal Problem:   Shortness of breath    Assessment and Plan:  Pleuritic Chest Pain  Dyspnea on Exertion History of DVT Dx with DVT 11/2022 with [place] with age indeterminate DVT bilaterally  Sudden onset yesterday, notes L sided pleuritic CP and progressive DOE Concerning for PE in the setting of elevated d dimer She's on eliquis, notes regular use, this would represent eliquis failure if she has PE/DVT -> would consult hematology if positive for DVT/PE Will transition to heparin gtt for now Awaiting VQ scan given not appropriate for IV contrast with her AKI on CKD, follow LE [place] (has mild right greater than left edema) Follow CT chest Follow echo  CXR with mild lung base opacities,  L>R  AKI on CKD IV Her creatinine appears to have suddenly declined after 11/2022 -> from baseline around 1.3, to 2.1-2.6 recently Here is 2.8 Hold lasix, she appears euvolemic.  Hold farxiga.  Hold kerendia. I don't see renal imaging, will get renal US Will get UA  Elevated BNP In setting of elevated creatinine Appears euvolemic, follow CT chest and echo  Anemia Mild, trend  Hypertension Continue amlodipine, metoprolol, hydralazine Holding kerendia and lasix  Hypothyroidism Synthroid  Dyslipidemia Crestor    DVT prophylaxis: Heparin gtt  Code Status:   full  Family Communication:  Son at bedside  Disposition Plan:   Patient is from:  home  Anticipated DC to:  home  Anticipated DC date:  Pending further eval, diagnosis, next steps  Anticipated DC barriers: diagnosis  Consults called:  none  Admission status:  obs   Severity of Illness: The appropriate patient status for this patient is OBSERVATION. Observation status is judged to be reasonable and necessary in order to provide the required intensity of service to ensure the patient's safety. The patient's presenting symptoms, physical exam findings, and initial radiographic and laboratory data in the context of their medical condition is felt to place them at decreased risk for further clinical deterioration. Furthermore, it is anticipated that the patient will be medically stable for discharge from the hospital within 2 midnights of admission.     Lacretia Nicks MD [facility]  How to contact the [facility] Attending or Consulting provider 7A - 7P or covering provider during after hours 7P -7A, for this patient?   Check the care team in [facility] and look for Fernado Brigante) attending/consulting [facility] provider listed and b) the [facility] team listed Log into [email or web address] and use [place]'s universal password to access. If you do not have the password, please contact the hospital operator. Locate the [facility] provider you are looking for under  [facility] and page to Etta Gassett number that you can be directly reached. If you still have difficulty reaching the provider, please page the [facility] (Director on Call) for the Hospitalists listed on amion for assistance.  06/27/2023, 6:28 PM

## 2023-06-27 NOTE — ED Provider Notes (Signed)
  Physical Exam  BP (!) 117/54   Pulse 71   Temp 98.9 F (37.2 C) (Oral)   Resp 18   SpO2 97%   Physical Exam Vitals and nursing note reviewed.  HENT:     Head: Normocephalic and atraumatic.  Eyes:     Pupils: Pupils are equal, round, and reactive to light.  Cardiovascular:     Rate and Rhythm: Normal rate and regular rhythm.  Pulmonary:     Effort: Pulmonary effort is normal.     Breath sounds: Normal breath sounds.  Abdominal:     Palpations: Abdomen is soft.     Tenderness: There is no abdominal tenderness.  Skin:    General: Skin is warm and dry.  Neurological:     Mental Status: She is alert.  Psychiatric:        Mood and Affect: Mood normal.     Procedures  Procedures  ED Course / MDM   Clinical Course as of 06/27/23 1703  Tue Jun 27, 2023  1702 Discussed with admitting hospitalist who accepts patient for admission [MP]    Clinical Course User Index [MP] Royanne Foots, DO   Medical Decision Making I, Estelle June DO, have assumed care of this patient from the previous provider pending admission.  Per recommendations of pulmonology, we will pause Eliquis, start heparin per pharmacy protocol and get echo and VQ scan during admission  Amount and/or Complexity of Data Reviewed Labs: ordered. Radiology: ordered.  Risk Prescription drug management. Decision regarding hospitalization.          Royanne Foots, DO 06/27/23 1704

## 2023-06-27 NOTE — ED Provider Notes (Signed)
[place] EMERGENCY DEPARTMENT AT [facility] Provider Note   CSN: [id number] Arrival date & time: 06/27/23  1121     History {Add pertinent medical, surgical, social history, OB history to HPI:1} Chief Complaint  Patient presents with   Chest Pain    Michaela Baker is a 73 y.o. female.  Patient has a history of congestive heart failure and hypertension.  She complains of left-sided lateral chest discomfort.  No shortness of breath  The history is provided by the patient and medical records. No language interpreter was used.  Chest Pain Pain location:  L chest Pain quality: aching   Pain radiates to:  Does not radiate Pain severity:  Moderate Onset quality:  Sudden Timing:  Constant Progression:  Worsening Chronicity:  New Context: not breathing   Associated symptoms: no abdominal pain, no back pain, no cough, no fatigue and no headache        Home Medications Prior to Admission medications   Medication Sig Start Date End Date Taking? Authorizing Provider  acetaminophen (TYLENOL) 500 MG tablet Take 1,000 mg by mouth every 8 (eight) hours as needed (for pain).    [provider]  amLODipine (NORVASC) 10 MG tablet Take 10 mg by mouth daily. 02/04/20   [provider]  apixaban (ELIQUIS) 5 MG TABS tablet Take 2 tablets (10 mg total) by mouth 2 (two) times daily for 3 days, THEN 1 tablet (5 mg total) 2 (two) times daily. Patient taking differently: Take 20 mg by mouth in the morning and at bedtime 12/25/22 03/28/23  Pokhrel, Rebekah Chesterfield, MD  dapagliflozin propanediol (FARXIGA) 10 MG TABS tablet Take 10 mg by mouth daily.    [provider]  furosemide (LASIX) 40 MG tablet Take 40 mg by mouth in the morning and at bedtime. 11/21/22   [provider]  hydrALAZINE (APRESOLINE) 50 MG tablet Take 50 mg by mouth daily.    [provider]  KERENDIA 10 MG TABS Take 10 mg by mouth daily.    [provider]  levothyroxine  (SYNTHROID, LEVOTHROID) 25 MCG tablet Take 25 mcg by mouth daily before breakfast. 02/05/15   [provider]  metoprolol succinate (TOPROL-XL) 50 MG 24 hr tablet Take 50 mg by mouth daily. Take with or immediately following a meal.    [provider]  Multiple Vitamins-Minerals (MULTIVITAMIN WITH MINERALS) tablet Take 1 tablet by mouth daily with breakfast.    [provider]  mupirocin ointment (BACTROBAN) 2 % apply 1 application topically 2 times daily Patient taking differently: Apply 1 Application topically See admin instructions. Apply to the affected foot 2 times a day 02/23/23   Standiford, Jenelle Mages, DPM  potassium chloride SA (KLOR-CON M) 20 MEQ tablet Take 1 tablet (20 mEq total) by mouth daily. 12/25/22   Pokhrel, Rebekah Chesterfield, MD  rosuvastatin (CRESTOR) 5 MG tablet Take 5 mg by mouth daily. 05/07/21   [provider]      Allergies    Patient has no known allergies.    Review of Systems   Review of Systems  Constitutional:  Negative for appetite change and fatigue.  HENT:  Negative for congestion, ear discharge and sinus pressure.   Eyes:  Negative for discharge.  Respiratory:  Negative for cough.   Cardiovascular:  Positive for chest pain.  Gastrointestinal:  Negative for abdominal pain and diarrhea.  Genitourinary:  Negative for frequency and hematuria.  Musculoskeletal:  Negative for back pain.  Skin:  Negative for rash.  Neurological:  Negative for seizures and headaches.  Psychiatric/Behavioral:  Negative for hallucinations.     Physical Exam Updated Vital Signs BP (!) 117/54   Pulse 71   Temp 97.8 F (36.6 C) (Oral)   Resp 18   SpO2 97%  Physical Exam Vitals and nursing note reviewed.  Constitutional:      Appearance: She is well-developed.  HENT:     Head: Normocephalic.     Nose: Nose normal.  Eyes:     General: No scleral icterus.    Conjunctiva/sclera: Conjunctivae normal.  Neck:     Thyroid: No thyromegaly.   Cardiovascular:     Rate and Rhythm: Normal rate and regular rhythm.     Heart sounds: No murmur heard.    No friction rub. No gallop.  Pulmonary:     Breath sounds: No stridor. No wheezing or rales.  Chest:     Chest wall: No tenderness.  Abdominal:     General: There is no distension.     Tenderness: There is no abdominal tenderness. There is no rebound.  Musculoskeletal:        General: Normal range of motion.     Cervical back: Neck supple.  Lymphadenopathy:     Cervical: No cervical adenopathy.  Skin:    Findings: No erythema or rash.  Neurological:     Mental Status: She is alert and oriented to person, place, and time.     Motor: No abnormal muscle tone.     Coordination: Coordination normal.  Psychiatric:        Behavior: Behavior normal.     ED Results / Procedures / Treatments   Labs (all labs ordered are listed, but only abnormal results are displayed) Labs Reviewed  BASIC METABOLIC PANEL - Abnormal; Notable for the following components:      Result Value   BUN 36 (*)    Creatinine, Ser 2.83 (*)    GFR, Estimated 17 (*)    All other components within normal limits  CBC - Abnormal; Notable for the following components:   RBC 3.84 (*)    Hemoglobin 10.2 (*)    HCT 34.5 (*)    MCHC 29.6 (*)    All other components within normal limits  BRAIN NATRIURETIC PEPTIDE - Abnormal; Notable for the following components:   B Natriuretic Peptide 150.1 (*)    All other components within normal limits  D-DIMER, QUANTITATIVE - Abnormal; Notable for the following components:   D-Dimer, Quant 2.18 (*)    All other components within normal limits  TROPONIN I (HIGH SENSITIVITY)  TROPONIN I (HIGH SENSITIVITY)    EKG EKG Interpretation Date/Time:  Tuesday June 27 2023 11:37:52 EST Ventricular Rate:  77 PR Interval:  188 QRS Duration:  78 QT Interval:  382 QTC Calculation: 432 R Axis:   51  Text Interpretation: Sinus rhythm with frequent Premature ventricular  complexes Nonspecific ST abnormality Abnormal ECG When compared with ECG of 24-Feb-2023 11:44, PREVIOUS ECG IS PRESENT Confirmed by Bethann Berkshire [phone]) on 06/27/2023 2:17:06 PM  Radiology DG Chest 2 View Result Date: 06/27/2023 CLINICAL DATA:  Chest pain EXAM: CHEST - 2 VIEW COMPARISON:  02/24/2023 FINDINGS: No pneumothorax. Normal cardiopericardial silhouette calcified tortuous aorta. There is some increasing bandlike opacities in the bases, left greater than right. Favor atelectasis over infiltrate but recommend follow-up. No pneumothorax or edema. Surgical clips in the right upper quadrant of the abdomen. Degenerative changes along the spine. IMPRESSION: Mild lung base opacities are seen, left-greater-than-right  but increased from previous. Atelectasis is favored over infiltrate but recommend follow-up. Electronically Signed   By: Karen Kays M.D.   On: 06/27/2023 13:03    Procedures Procedures  {Document cardiac monitor, telemetry assessment procedure when appropriate:1}  Medications Ordered in ED Medications  HYDROcodone-acetaminophen (NORCO/VICODIN) 5-325 MG per tablet 1 tablet (has no administration in time range)    ED Course/ Medical Decision Making/ A&P   {Patient with left lateral chest pain with normal troponins and elevated D-dimer.  Patient has a GFR of 17 and cannot get a CT angio of her chest.  I spoke with pulmonary critical care and they recommended starting the patient on heparin and holding her Eliquis.  They recommend getting a VQ scan tomorrow and an echo.  Patient will be admitted to medicine Click here for ABCD2, HEART and other calculatorsREFRESH Note before signing :1}                              Medical Decision Making Amount and/or Complexity of Data Reviewed Labs: ordered. Radiology: ordered.  Risk Prescription drug management.   Atypical chest pain.Marland Kitchen  She will be admitted to medicine and will be placed on heparin and stopping her Eliquis.  She will get  an echo and a VQ scan  {Document critical care time when appropriate:1} {Document review of labs and clinical decision tools ie heart score, Chads2Vasc2 etc:1}  {Document your independent review of radiology images, and any outside records:1} {Document your discussion with family members, caretakers, and with consultants:1} {Document social determinants of health affecting pt's care:1} {Document your decision making why or why not admission, treatments were needed:1} Final Clinical Impression(s) / ED Diagnoses Final diagnoses:  None    Rx / DC Orders ED Discharge Orders     None

## 2023-06-28 ENCOUNTER — Observation Stay (HOSPITAL_COMMUNITY): Payer: 59

## 2023-06-28 ENCOUNTER — Observation Stay (HOSPITAL_BASED_OUTPATIENT_CLINIC_OR_DEPARTMENT_OTHER): Payer: 59

## 2023-06-28 DIAGNOSIS — R7989 Other specified abnormal findings of blood chemistry: Secondary | ICD-10-CM

## 2023-06-28 DIAGNOSIS — R0602 Shortness of breath: Secondary | ICD-10-CM | POA: Diagnosis not present

## 2023-06-28 LAB — COMPREHENSIVE METABOLIC PANEL
ALT: 10 U/L (ref 0–44)
AST: 16 U/L (ref 15–41)
Albumin: 3 g/dL — ABNORMAL LOW (ref 3.5–5.0)
Alkaline Phosphatase: 36 U/L — ABNORMAL LOW (ref 38–126)
Anion gap: 12 (ref 5–15)
BUN: 38 mg/dL — ABNORMAL HIGH (ref 8–23)
CO2: 21 mmol/L — ABNORMAL LOW (ref 22–32)
Calcium: 8.9 mg/dL (ref 8.9–10.3)
Chloride: 106 mmol/L (ref 98–111)
Creatinine, Ser: 2.15 mg/dL — ABNORMAL HIGH (ref 0.44–1.00)
GFR, Estimated: 24 mL/min — ABNORMAL LOW (ref >60)
Glucose, Bld: 84 mg/dL (ref 70–99)
Potassium: 4 mmol/L (ref 3.5–5.1)
Sodium: 139 mmol/L (ref 135–145)
Total Bilirubin: 0.5 mg/dL (ref 0.0–1.2)
Total Protein: 7.7 g/dL (ref 6.5–8.1)

## 2023-06-28 LAB — CBC
HCT: 30.4 % — ABNORMAL LOW (ref 36.0–46.0)
Hemoglobin: 9.3 g/dL — ABNORMAL LOW (ref 12.0–15.0)
MCH: 27 pg (ref 26.0–34.0)
MCHC: 30.6 g/dL (ref 30.0–36.0)
MCV: 88.1 fL (ref 80.0–100.0)
Platelets: 137 10*3/uL — ABNORMAL LOW (ref 150–400)
RBC: 3.45 MIL/uL — ABNORMAL LOW (ref 3.87–5.11)
RDW: 13.7 % (ref 11.5–15.5)
WBC: 2.9 10*3/uL — ABNORMAL LOW (ref 4.0–10.5)
nRBC: 0 % (ref 0.0–0.2)

## 2023-06-28 LAB — HIV ANTIBODY (ROUTINE TESTING W REFLEX): HIV Screen 4th Generation wRfx: NONREACTIVE

## 2023-06-28 LAB — APTT: aPTT: 155 s — ABNORMAL HIGH (ref 24–36)

## 2023-06-28 LAB — HEPARIN LEVEL (UNFRACTIONATED): Heparin Unfractionated: >1.1 [IU]/mL — ABNORMAL HIGH (ref 0.30–0.70)

## 2023-06-28 MED ORDER — TECHNETIUM TO 99M ALBUMIN AGGREGATED
4.1000 | Freq: Once | INTRAVENOUS | Status: AC | PRN
  Administered 2023-06-28: 4.1 via INTRAVENOUS

## 2023-06-28 MED ORDER — APIXABAN 5 MG PO TABS: 5.0000 mg | ORAL_TABLET | Freq: Two times a day (BID) | ORAL | Status: AC

## 2023-06-28 MED ORDER — HEPARIN (PORCINE) 25000 UT/250ML-% IV SOLN
1050.0000 [IU]/h | INTRAVENOUS | Status: DC
Start: 2023-06-28 — End: 2023-06-28
  Administered 2023-06-28: 1050 [IU]/h via INTRAVENOUS

## 2023-06-28 MED ORDER — DOXYCYCLINE HYCLATE 100 MG PO TABS: 100.0000 mg | ORAL_TABLET | Freq: Two times a day (BID) | ORAL | 0 refills | Status: AC

## 2023-06-28 NOTE — ED Notes (Signed)
Called lab to check on results for APTT and heparin level

## 2023-06-28 NOTE — Progress Notes (Signed)
PHARMACY - ANTICOAGULATION CONSULT NOTE  Pharmacy Consult for Heparin Indication: VTE treatment  No Known Allergies  Patient Measurements: Height: 5\' 5"  (165.1 cm) Weight: 77.3 kg (170 lb 6.4 oz) IBW/kg (Calculated) : 57 Heparin Dosing Weight: 73 kg  Vital Signs: Temp: 98.7 F (37.1 C) (01/29 0600) Temp Source: Oral (01/29 0302) BP: 100/64 (01/29 0600) Pulse Rate: 65 (01/29 0600)  Labs: Recent Labs    06/27/23 1209 06/27/23 1504 06/28/23 0609 06/28/23 0612  HGB 10.2*  --   --  9.3*  HCT 34.5*  --   --  30.4*  PLT 157  --   --  137*  APTT  --  <24* 155*  --   LABPROT  --  15.4*  --   --   INR  --  1.2  --   --   HEPARINUNFRC  --  >2.00* >1.10*  --   CREATININE 2.83*  --   --  2.15*  TROPONINIHS 14 11  --   --     Estimated Creatinine Clearance: 23.9 mL/min (A) (by C-G formula based on SCr of 2.15 mg/dL (H)).   Assessment: 45 yoF presented to ED on 1/28 with chest pain.  Pharmacy is consulted to dose heparin while PTA apixaban for hx of DVT is on hold.  Echo and VQ scan pending.    PTA apixaban 5mg  BID, last filled on 06/05/23.  Last dose on 1/28 at 10am.  Baseline coags ordered.  Expect heparin levels will be falsely elevated d/t recent DOAC use and will use aPTT for monitoring/dosing.   CBC:  Hgb 10.2 (near baseline 8-10 range), Plt WNL (baseline 100-200s) SCr 2.8, elevated above previous levels (2.1, 1.6)  D-dimer 2.18 (note AKI) 06/28/2023 First aPTT 155 sec - above goal on heparin drip at 1250 units/hr Heparin level > 1.1 due to recent apixaban use Hg 10.2> 9.2, PLT 157> 137  Goal of Therapy:  Heparin level 0.3-0.7 units/ml aPTT 66-102 seconds Monitor platelets by anticoagulation protocol: Yes   Plan:  Hold heparin x 1 hour then resume at 1050 units/hr and check 8 hr aPTT APTT in 8 hours after starting Daily aPTT, heparin level, and CBC Follow up labs, imaging.    Herby Abraham, Pharm.D Use secure chat for questions 06/28/2023 8:56 AM

## 2023-06-28 NOTE — Progress Notes (Signed)
Bilateral lower extremity venous duplex has been completed. Preliminary results can be found in CV Proc through chart review.   06/28/23 8:58 AM Olen Cordial RVT

## 2023-06-28 NOTE — ED Notes (Signed)
Patient denies pain and is resting comfortably.

## 2023-06-28 NOTE — ED Notes (Signed)
Called lab to follow up about heparin infusion. Labs are ordered for patient for 0600 per pharmacy.

## 2023-06-28 NOTE — Discharge Summary (Addendum)
Physician Discharge Summary  Michaela Baker ZOX:[id number] DOB: 1950-09-13 DOA: 06/27/2023  PCP: System, Provider Not In  Admit date: 06/27/2023 Discharge date: 06/28/2023  Admitted From: home Disposition:  home  Recommendations for Outpatient Follow-up:  Follow up with PCP in 1-2 weeks  Home Health: none Equipment/Devices: none  Discharge Condition: stable CODE STATUS: Full code Diet Orders (From admission, onward)     Start     Ordered   06/27/23 2014  Diet regular Room service appropriate? Yes; Fluid consistency: Thin  Diet effective now       Question Answer Comment  Room service appropriate? Yes   Fluid consistency: Thin      06/27/23 2013           HPI: Per admitting MD, Michaela Baker is a 73 y.o. female with medical history significant of bilateral lower extremity DVT's on eliquis, hypertension, diastolic heart failure, hypothyroidism and other medical issues here with L sided chest pain. She notes her symptoms started last night with left sided pain. Pain is described as "kind of sharp", comes and goes.  Moving makes it worse.  Worse with deep breaths.  She denies any other symptoms other than worsening dyspnea on exertion.  She notes this started yesterday and has gotten progressively worse today.  She denies fevers, chills, cough (she does occasionally brings up some mucus).  She denies CP, nausea, vomiting, abdominal pain.  She denies lower extremity swelling.  She notes she takes her eliquis daily, denies missed doses.  Denies smoking or drinking.  Lives with family who is a CNA, occasionally uses cane/walker to ambulate.  Hospital Course / Discharge diagnoses: Principal problem Pleuritic Chest Pain, History of DVT -patient was diagnosed with DVT in 2024, age indeterminant.  She was placed on anticoagulation.  She presented to the hospital with sudden onset left-sided chest pain, worse with movement but not really pleuritic based on my evaluation.  D-dimer was  elevated however this is in the setting of chronic kidney disease.  High-sensitivity troponin negative x 2.  There was concern for PE on admission, underwent a VQ scan which was negative.  Lower extremity DVT was negative for acute findings but it did show her chronic indeterminant known DVTs.  She was initially maintained on heparin drip, given negative workup will be transitioned back to her home Eliquis.  She is doing well on room air, able to ambulate in the hall without further difficulties, will discharge home in stable condition  Active problems Possible CAP-seen on CT scan, she was placed on antibiotics, and did report a productive cough.  This may have been the reason for #1, will switch to doxycycline for 4 additional days. AKI on CKD IV - Her creatinine appears to have suddenly declined after 11/2022 and her most recent baseline range between 2.1 - 2.3.  Kidney function has returned to baseline, she is euvolemic  Anemia of CKD -hemoglobin stable, no bleeding Hypertension - Continue home medications Hypothyroidism - Synthroid Dyslipidemia - Crestor  Sepsis ruled out  Discharge Instructions   Allergies as of 06/28/2023   No Known Allergies      Medication List     TAKE these medications    acetaminophen 500 MG tablet Commonly known as: TYLENOL Take 1,000 mg by mouth every 8 (eight) hours as needed (for pain).   amLODipine 10 MG tablet Commonly known as: NORVASC Take 10 mg by mouth daily.   apixaban 5 MG Tabs tablet Commonly known as: ELIQUIS Take 1  tablet (5 mg total) by mouth 2 (two) times daily for 3 days. What changed: See the new instructions.   dapagliflozin propanediol 10 MG Tabs tablet Commonly known as: FARXIGA Take 10 mg by mouth daily.   doxycycline 100 MG tablet Commonly known as: VIBRA-TABS Take 1 tablet (100 mg total) by mouth 2 (two) times daily for 4 days.   furosemide 40 MG tablet Commonly known as: LASIX Take 40 mg by mouth in the morning and at  bedtime.   hydrALAZINE 50 MG tablet Commonly known as: APRESOLINE Take 50 mg by mouth daily.   Kerendia 10 MG Tabs Generic drug: Finerenone Take 10 mg by mouth daily.   levothyroxine 25 MCG tablet Commonly known as: SYNTHROID Take 25 mcg by mouth daily before breakfast.   metoprolol succinate 50 MG 24 hr tablet Commonly known as: TOPROL-XL Take 50 mg by mouth daily. Take with or immediately following a meal.   multivitamin with minerals tablet Take 1 tablet by mouth daily with breakfast.   potassium chloride SA 20 MEQ tablet Commonly known as: KLOR-CON M Take 1 tablet (20 mEq total) by mouth daily.   rosuvastatin 5 MG tablet Commonly known as: CRESTOR Take 5 mg by mouth daily.       Consultations: none  Procedures/Studies:  NM Pulmonary Perfusion Result Date: 06/28/2023 CLINICAL DATA:  Chest pain, shortness of breath EXAM: NUCLEAR MEDICINE PERFUSION LUNG SCAN TECHNIQUE: Perfusion images were obtained in multiple projections after intravenous injection of radiopharmaceutical. Ventilation scans intentionally deferred if perfusion scan and chest x-ray adequate for interpretation during COVID 19 epidemic. RADIOPHARMACEUTICALS:  4.1 mCi Tc-19m MAA IV COMPARISON:  Chest x-ray 06/27/2023 FINDINGS: No segmental or subsegmental perfusion defects to suggest pulmonary emboli. IMPRESSION: No evidence of pulmonary emboli. Electronically Signed   By: Michaela Baker M.D.   On: 06/28/2023 10:52   VAS [place] LOWER EXTREMITY VENOUS (DVT) Result Date: 06/28/2023  Lower Venous DVT Study Patient Name:  Michaela Baker [facility]  Date of Exam:   06/28/2023 Medical Rec #: [id number]      Accession #:    [id number] Date of Birth: [birth year]      Patient Gender: F Patient Age:   55 years Exam Location:  [facility] Procedure:      VAS [place] LOWER EXTREMITY VENOUS (DVT) Referring Phys: A POWELL JR --------------------------------------------------------------------------------  Indications: Elevated Ddimer.  Risk  Factors: DVT. Comparison Study: 12/19/2022 - RIGHT:                   - Findings consistent with age indeterminate deep vein                   thrombosis                   involving the right common femoral vein, and right popliteal                   vein.                   - No cystic structure found in the popliteal fossa.                    LEFT:                   - Findings consistent with age indeterminate deep vein                   thrombosis  involving the left common femoral vein, left femoral vein,                   left popliteal                   vein, and left posterior tibial veins.                   - No cystic structure found in the popliteal fossa. Performing Technologist: Chanda Busing RVT  Examination Guidelines: A complete evaluation includes B-mode imaging, spectral Doppler, color Doppler, and power Doppler as needed of all accessible portions of each vessel. Bilateral testing is considered an integral part of a complete examination. Limited examinations for reoccurring indications may be performed as noted. The reflux portion of the exam is performed with the patient in reverse Trendelenburg.  +---------+---------------+---------+-----------+----------+-----------------+ RIGHT    CompressibilityPhasicitySpontaneityPropertiesThrombus Aging    +---------+---------------+---------+-----------+----------+-----------------+ CFV      Full           Yes      Yes                                    +---------+---------------+---------+-----------+----------+-----------------+ SFJ      Full                                                           +---------+---------------+---------+-----------+----------+-----------------+ FV Prox  Full                                                           +---------+---------------+---------+-----------+----------+-----------------+ FV Mid   Full                                                            +---------+---------------+---------+-----------+----------+-----------------+ FV DistalFull                                                           +---------+---------------+---------+-----------+----------+-----------------+ PFV      Full                                                           +---------+---------------+---------+-----------+----------+-----------------+ POP      Partial        No       No                   Age Indeterminate +---------+---------------+---------+-----------+----------+-----------------+ PTV      Full                                                           +---------+---------------+---------+-----------+----------+-----------------+  PERO     Full                                                           +---------+---------------+---------+-----------+----------+-----------------+   +---------+---------------+---------+-----------+----------+-------------------+ LEFT     CompressibilityPhasicitySpontaneityPropertiesThrombus Aging      +---------+---------------+---------+-----------+----------+-------------------+ CFV      Full           Yes      Yes                                      +---------+---------------+---------+-----------+----------+-------------------+ SFJ      Full                                                             +---------+---------------+---------+-----------+----------+-------------------+ FV Prox  Partial        Yes      Yes                  Chronic             +---------+---------------+---------+-----------+----------+-------------------+ FV Mid   Partial        Yes      Yes                  Chronic             +---------+---------------+---------+-----------+----------+-------------------+ FV DistalFull                                                             +---------+---------------+---------+-----------+----------+-------------------+ PFV      Full                                                              +---------+---------------+---------+-----------+----------+-------------------+ POP      Full           Yes      Yes                                      +---------+---------------+---------+-----------+----------+-------------------+ PTV      Full                                                             +---------+---------------+---------+-----------+----------+-------------------+ PERO  Not well visualized +---------+---------------+---------+-----------+----------+-------------------+    Summary: RIGHT: - Findings consistent with age indeterminate deep vein thrombosis involving the right popliteal vein.  - No cystic structure found in the popliteal fossa.  LEFT: - Findings consistent with chronic deep vein thrombosis involving the left femoral vein.  - No cystic structure found in the popliteal fossa.  *See table(s) above for measurements and observations.    Preliminary    US RENAL Result Date: 06/27/2023 CLINICAL DATA:  Acute kidney injury. EXAM: RENAL / URINARY TRACT ULTRASOUND COMPLETE COMPARISON:  None Available. FINDINGS: Right Kidney: Renal measurements: 10.2 cm x 5.0 cm x 4.9 cm = volume: 130.5 mL. Echogenicity within normal limits. A 0.8 cm x 0.6 cm x 0.7 cm right renal cyst is seen. No hydronephrosis is visualized. Left Kidney: Renal measurements: 10.0 cm x 5.3 cm x 5.8 cm = volume: 160.4 mL. Echogenicity within normal limits. A 1.7 cm x 1.7 cm x 1.7 cm left renal cyst is seen. No hydronephrosis is visualized. Bladder: Appears normal for degree of bladder distention. Other: None. IMPRESSION: Bilateral renal cysts, as described above. Electronically Signed   By: Aram Candela M.D.   On: 06/27/2023 19:24   CT Chest Wo Contrast Result Date: 06/27/2023 CLINICAL DATA:  Respiratory illness. Left-sided chest pain. History of CHF EXAM: CT CHEST WITHOUT CONTRAST TECHNIQUE:  Multidetector CT imaging of the chest was performed following the standard protocol without IV contrast. RADIATION DOSE REDUCTION: This exam was performed according to the departmental dose-optimization program which includes automated exposure control, adjustment of the mA and/or kV according to patient size and/or use of iterative reconstruction technique. COMPARISON:  X-ray earlier 06/27/2023. CT angiogram chest 12/19/2022. FINDINGS: Cardiovascular: Coronary artery calcifications are seen. Heart is nonenlarged. Coronary artery calcifications are identified. Trace pericardial fluid or thickening. On this non IV contrast exam the thoracic aorta is normal course and caliber with calcified plaque. Bovine type aortic arch. Mediastinum/Nodes: Slightly patulous thoracic esophagus. Enlarged thyroid gland with focal nodule seen, unchanged from previous largest on the right measuring 2.4 cm. Recommend additional workup with thyroid ultrasound when clinically appropriate if not already performed in the past. No specific abnormal lymph node enlargement identified in the axillary regions, hilum or mediastinum. Lungs/Pleura: Centrilobular emphysematous lung changes are identified. There is hazy ground-glass in the lungs diffusely. There are some patchy consolidative areas along both lower lobes with bronchial wall thickening. Trace pleural fluid in these locations as well on the right. Upper Abdomen: Adrenal glands are preserved in the visualized portions of the upper abdomen. Previous cholecystectomy. Musculoskeletal: Curvature and degenerative changes along the spine. IMPRESSION: Patchy consolidative areas along both lower lobes with bronchial wall thickening. Possible infiltrate or infection. Recommend follow-up. Trace right pleural fluid. Underlying emphysematous changes with diffuse hazy ground-glass interstitial changes to the lungs. These areas are decreasing from previous. Please correlate for any known history  including pneumonitis. The nodular opacity in the right upper lobe on the prior CT is no longer identified. Aortic Atherosclerosis (ICD10-I70.0) and Emphysema (ICD10-J43.9). Electronically Signed   By: Karen Kays M.D.   On: 06/27/2023 18:56   DG Chest 2 View Result Date: 06/27/2023 CLINICAL DATA:  Chest pain EXAM: CHEST - 2 VIEW COMPARISON:  02/24/2023 FINDINGS: No pneumothorax. Normal cardiopericardial silhouette calcified tortuous aorta. There is some increasing bandlike opacities in the bases, left greater than right. Favor atelectasis over infiltrate but recommend follow-up. No pneumothorax or edema. Surgical clips in the right upper quadrant of the abdomen. Degenerative changes along the spine.  IMPRESSION: Mild lung base opacities are seen, left-greater-than-right but increased from previous. Atelectasis is favored over infiltrate but recommend follow-up. Electronically Signed   By: Karen Kays M.D.   On: 06/27/2023 13:03    Subjective: - no chest pain, shortness of breath, no abdominal pain, nausea or vomiting.   Discharge Exam: BP 124/66   Pulse 69   Temp 97.8 F (36.6 C) (Oral)   Resp 16   Ht 5\' 5"  (1.651 m)   Wt 77.3 kg   SpO2 96%   BMI 28.36 kg/m   General: Pt is alert, awake, not in acute distress Cardiovascular: RRR, S1/S2 +, no rubs, no gallops Respiratory: CTA bilaterally, no wheezing, no rhonchi Abdominal: Soft, NT, ND, bowel sounds + Extremities: no edema, no cyanosis  The results of significant diagnostics from this hospitalization (including imaging, microbiology, ancillary and laboratory) are listed below for reference.     Microbiology: Recent Results (from the past 240 hours)  Resp panel by RT-PCR (RSV, Flu A&B, Covid) Anterior Nasal Swab     Status: None   Collection Time: 06/27/23  6:59 PM   Specimen: Anterior Nasal Swab  Result Value Ref Range Status   SARS Coronavirus 2 by RT PCR NEGATIVE NEGATIVE Final    Comment: (NOTE) SARS-CoV-2 target nucleic  acids are NOT DETECTED.  The SARS-CoV-2 RNA is generally detectable in upper respiratory specimens during the acute phase of infection. The lowest concentration of SARS-CoV-2 viral copies this assay can detect is 138 copies/mL. A negative result does not preclude SARS-Cov-2 infection and should not be used as the sole basis for treatment or other patient management decisions. A negative result may occur with  improper specimen collection/handling, submission of specimen other than nasopharyngeal swab, presence of viral mutation(s) within the areas targeted by this assay, and inadequate number of viral copies(<138 copies/mL). A negative result must be combined with clinical observations, patient history, and epidemiological information. The expected result is Negative.  Fact Sheet for Patients:  BloggerCourse.com  Fact Sheet for Healthcare Providers:  SeriousBroker.it  This test is no t yet approved or cleared by the [place] FDA and  has been authorized for detection and/or diagnosis of SARS-CoV-2 by FDA under an Emergency Use Authorization (EUA). This EUA will remain  in effect (meaning this test can be used) for the duration of the COVID-19 declaration under Section 564(b)(1) of the Act, 21 U.S.C.section 360bbb-3(b)(1), unless the authorization is terminated  or revoked sooner.       Influenza A by PCR NEGATIVE NEGATIVE Final   Influenza B by PCR NEGATIVE NEGATIVE Final    Comment: (NOTE) The Xpert Xpress SARS-CoV-2/FLU/RSV plus assay is intended as an aid in the diagnosis of influenza from Nasopharyngeal swab specimens and should not be used as a sole basis for treatment. Nasal washings and aspirates are unacceptable for Xpert Xpress SARS-CoV-2/FLU/RSV testing.  Fact Sheet for Patients: BloggerCourse.com  Fact Sheet for Healthcare Providers: SeriousBroker.it  This  test is not yet approved or cleared by the [place] FDA and has been authorized for detection and/or diagnosis of SARS-CoV-2 by FDA under an Emergency Use Authorization (EUA). This EUA will remain in effect (meaning this test can be used) for the duration of the COVID-19 declaration under Section 564(b)(1) of the Act, 21 U.S.C. section 360bbb-3(b)(1), unless the authorization is terminated or revoked.     Resp Syncytial Virus by PCR NEGATIVE NEGATIVE Final    Comment: (NOTE) Fact Sheet for Patients: BloggerCourse.com  Fact Sheet for Healthcare  Providers: SeriousBroker.it  This test is not yet approved or cleared by the [place] and has been authorized for detection and/or diagnosis of SARS-CoV-2 by FDA under an Emergency Use Authorization (EUA). This EUA will remain in effect (meaning this test can be used) for the duration of the COVID-19 declaration under Section 564(b)(1) of the Act, 21 U.S.C. section 360bbb-3(b)(1), unless the authorization is terminated or revoked.  Performed at [facility], [address], [place], [place] [address]      Labs: Basic Metabolic Panel: Recent Labs  Lab 06/27/23 1209 06/28/23 0612  NA 138 139  K 3.9 4.0  CL 106 106  CO2 22 21*  GLUCOSE 96 84  BUN 36* 38*  CREATININE 2.83* 2.15*  CALCIUM 9.4 8.9   Liver Function Tests: Recent Labs  Lab 06/28/23 0612  AST 16  ALT 10  ALKPHOS 36*  BILITOT 0.5  PROT 7.7  ALBUMIN 3.0*   CBC: Recent Labs  Lab 06/27/23 1209 06/28/23 0612  WBC 4.0 2.9*  HGB 10.2* 9.3*  HCT 34.5* 30.4*  MCV 89.8 88.1  PLT 157 137*   CBG: No results for input(s): "GLUCAP" in the last 168 hours. Hgb A1c No results for input(s): "HGBA1C" in the last 72 hours. Lipid Profile No results for input(s): "CHOL", "HDL", "LDLCALC", "TRIG", "CHOLHDL", "LDLDIRECT" in the last 72 hours. Thyroid function studies No results for  input(s): "TSH", "T4TOTAL", "T3FREE", "THYROIDAB" in the last 72 hours.  Invalid input(s): "FREET3" Urinalysis    Component Value Date/Time   COLORURINE YELLOW 02/25/2023 1042   APPEARANCEUR CLEAR 02/25/2023 1042   LABSPEC 1.018 02/25/2023 1042   PHURINE 6.0 02/25/2023 1042   GLUCOSEU 50 (A) 02/25/2023 1042   HGBUR LARGE (A) 02/25/2023 1042   BILIRUBINUR NEGATIVE 02/25/2023 1042   KETONESUR NEGATIVE 02/25/2023 1042   PROTEINUR 100 (A) 02/25/2023 1042   UROBILINOGEN 0.2 07/14/2010 2041   NITRITE NEGATIVE 02/25/2023 1042   LEUKOCYTESUR NEGATIVE 02/25/2023 1042    FURTHER DISCHARGE INSTRUCTIONS:   Get Medicines reviewed and adjusted: Please take all your medications with you for your next visit with your Primary MD   Laboratory/radiological data: Please request your Primary MD to go over all hospital tests and procedure/radiological results at the follow up, please ask your Primary MD to get all Hospital records sent to his/her office.   In some cases, they will be blood work, cultures and biopsy results pending at the time of your discharge. Please request that your primary care M.D. goes through all the records of your hospital data and follows up on these results.   Also Note the following: If you experience worsening of your admission symptoms, develop shortness of breath, life threatening emergency, suicidal or homicidal thoughts you must seek medical attention immediately by calling 911 or calling your MD immediately  if symptoms less severe.   You must read complete instructions/literature along with all the possible adverse reactions/side effects for all the Medicines you take and that have been prescribed to you. Take any new Medicines after you have completely understood and accpet all the possible adverse reactions/side effects.    Do not drive when taking Pain medications or sleeping medications (Benzodaizepines)   Do not take more than prescribed Pain, Sleep and Anxiety  Medications. It is not advisable to combine anxiety,sleep and pain medications without talking with your primary care practitioner   Special Instructions: If you have smoked or chewed Tobacco  in the last 2 yrs please stop smoking, stop  any regular Alcohol  and or any Recreational drug use.   Wear Seat belts while driving.   Please note: You were cared for by a hospitalist during your hospital stay. Once you are discharged, your primary care physician will handle any further medical issues. Please note that NO REFILLS for any discharge medications will be authorized once you are discharged, as it is imperative that you return to your primary care physician (or establish a relationship with a primary care physician if you do not have one) for your post hospital discharge needs so that they can reassess your need for medications and monitor your lab values.  Time coordinating discharge: 35 minutes  SIGNED:  Pamella Pert, MD, PhD 06/28/2023, 12:44 PM

## 2023-06-28 NOTE — Discharge Instructions (Addendum)
Follow with your PCP in 1-2 weeks  Please get a complete blood count and chemistry panel checked by your Primary MD at your next visit, and again as instructed by your Primary MD. Please get your medications reviewed and adjusted by your Primary MD.  Please request your Primary MD to go over all Hospital Tests and Procedure/Radiological results at the follow up, please get all Hospital records sent to your Prim MD by signing hospital release before you go home.  In some cases, there will be blood work, cultures and biopsy results pending at the time of your discharge. Please request that your primary care M.D. goes through all the records of your hospital data and follows up on these results.  If you had Pneumonia of Lung problems at the Hospital: Please get a 2 view Chest X ray done in 6-8 weeks after hospital discharge or sooner if instructed by your Primary MD.  If you have Congestive Heart Failure: Please call your Cardiologist or Primary MD anytime you have any of the following symptoms:  1) 3 pound weight gain in 24 hours or 5 pounds in 1 week  2) shortness of breath, with or without a dry hacking cough  3) swelling in the hands, feet or stomach  4) if you have to sleep on extra pillows at night in order to breathe  Follow cardiac low salt diet and 1.5 lit/day fluid restriction.  If you have diabetes Accuchecks 4 times/day, Once in AM empty stomach and then before each meal. Log in all results and show them to your primary doctor at your next visit. If any glucose reading is under 80 or above 300 call your primary MD immediately.  If you have Seizure/Convulsions/Epilepsy: Please do not drive, operate heavy machinery, participate in activities at heights or participate in high speed sports until you have seen by Primary MD or a Neurologist and advised to do so again. Per [facility] statutes, patients with seizures are not allowed to drive until they have been seizure-free for  six months.  Use caution when using heavy equipment or power tools. Avoid working on ladders or at heights. Take showers instead of baths. Ensure the water temperature is not too high on the home water heater. Do not go swimming alone. Do not lock yourself in a room alone (i.e. bathroom). When caring for infants or small children, sit down when holding, feeding, or changing them to minimize risk of injury to the child in the event you have a seizure. Maintain good sleep hygiene. Avoid alcohol.   If you had Gastrointestinal Bleeding: Please ask your Primary MD to check a complete blood count within one week of discharge or at your next visit. Your endoscopic/colonoscopic biopsies that are pending at the time of discharge, will also need to followed by your Primary MD.  Get Medicines reviewed and adjusted. Please take all your medications with you for your next visit with your Primary MD  Please request your Primary MD to go over all hospital tests and procedure/radiological results at the follow up, please ask your Primary MD to get all Hospital records sent to his/her office.  If you experience worsening of your admission symptoms, develop shortness of breath, life threatening emergency, suicidal or homicidal thoughts you must seek medical attention immediately by calling 911 or calling your MD immediately  if symptoms less severe.  You must read complete instructions/literature along with all the possible adverse reactions/side effects for all the Medicines you take and  that have been prescribed to you. Take any new Medicines after you have completely understood and accpet all the possible adverse reactions/side effects.   Do not drive or operate heavy machinery when taking Pain medications.   Do not take more than prescribed Pain, Sleep and Anxiety Medications  Special Instructions: If you have smoked or chewed Tobacco  in the last 2 yrs please stop smoking, stop any regular Alcohol  and or any  Recreational drug use.  Wear Seat belts while driving.  Please note You were cared for by a hospitalist during your hospital stay. If you have any questions about your discharge medications or the care you received while you were in the hospital after you are discharged, you can call the unit and asked to speak with the hospitalist on call if the hospitalist that took care of you is not available. Once you are discharged, your primary care physician will handle any further medical issues. Please note that NO REFILLS for any discharge medications will be authorized once you are discharged, as it is imperative that you return to your primary care physician (or establish a relationship with a primary care physician if you do not have one) for your aftercare needs so that they can reassess your need for medications and monitor your lab values.  You can reach the hospitalist office at phone [phone] or fax [phone]   If you do not have a primary care physician, you can call [phone] for a physician referral.  Activity: As tolerated with Full fall precautions use walker/cane & assistance as needed    Diet: regular  Disposition Home

## 2023-07-05 ENCOUNTER — Emergency Department (HOSPITAL_COMMUNITY): Payer: 59

## 2023-07-05 ENCOUNTER — Other Ambulatory Visit: Payer: Self-pay

## 2023-07-05 ENCOUNTER — Emergency Department (HOSPITAL_COMMUNITY)
Admission: EM | Admit: 2023-07-05 | Discharge: 2023-07-06 | Disposition: A | Discharge status: Home / Self Care | Payer: 59 | Attending: Emergency Medicine | Admitting: Emergency Medicine

## 2023-07-05 ENCOUNTER — Encounter (HOSPITAL_COMMUNITY): Payer: Self-pay

## 2023-07-05 ENCOUNTER — Emergency Department (HOSPITAL_BASED_OUTPATIENT_CLINIC_OR_DEPARTMENT_OTHER): Payer: 59

## 2023-07-05 DIAGNOSIS — Z79899 Other long term (current) drug therapy: Secondary | ICD-10-CM | POA: Diagnosis not present

## 2023-07-05 DIAGNOSIS — I509 Heart failure, unspecified: Secondary | ICD-10-CM | POA: Diagnosis not present

## 2023-07-05 DIAGNOSIS — S80821A Blister (nonthermal), right lower leg, initial encounter: Secondary | ICD-10-CM | POA: Diagnosis not present

## 2023-07-05 DIAGNOSIS — N179 Acute kidney failure, unspecified: Secondary | ICD-10-CM | POA: Diagnosis not present

## 2023-07-05 DIAGNOSIS — X58XXXA Exposure to other specified factors, initial encounter: Secondary | ICD-10-CM | POA: Diagnosis not present

## 2023-07-05 DIAGNOSIS — I130 Hypertensive heart and chronic kidney disease with heart failure and stage 1 through stage 4 chronic kidney disease, or unspecified chronic kidney disease: Secondary | ICD-10-CM | POA: Diagnosis not present

## 2023-07-05 DIAGNOSIS — E039 Hypothyroidism, unspecified: Secondary | ICD-10-CM | POA: Insufficient documentation

## 2023-07-05 DIAGNOSIS — S8991XA Unspecified injury of right lower leg, initial encounter: Secondary | ICD-10-CM | POA: Diagnosis present

## 2023-07-05 DIAGNOSIS — M7989 Other specified soft tissue disorders: Secondary | ICD-10-CM

## 2023-07-05 DIAGNOSIS — N189 Chronic kidney disease, unspecified: Secondary | ICD-10-CM | POA: Insufficient documentation

## 2023-07-05 DIAGNOSIS — Z7901 Long term (current) use of anticoagulants: Secondary | ICD-10-CM | POA: Insufficient documentation

## 2023-07-05 LAB — CBC WITH DIFFERENTIAL/PLATELET
Abs Immature Granulocytes: 0.01 10*3/uL (ref 0.00–0.07)
Basophils Absolute: 0 10*3/uL (ref 0.0–0.1)
Basophils Relative: 1 %
Eosinophils Absolute: 0.1 10*3/uL (ref 0.0–0.5)
Eosinophils Relative: 2 %
HCT: 34.2 % — ABNORMAL LOW (ref 36.0–46.0)
Hemoglobin: 10.4 g/dL — ABNORMAL LOW (ref 12.0–15.0)
Immature Granulocytes: 0 %
Lymphocytes Relative: 21 %
Lymphs Abs: 0.7 10*3/uL (ref 0.7–4.0)
MCH: 26.5 pg (ref 26.0–34.0)
MCHC: 30.4 g/dL (ref 30.0–36.0)
MCV: 87.2 fL (ref 80.0–100.0)
Monocytes Absolute: 0.3 10*3/uL (ref 0.1–1.0)
Monocytes Relative: 9 %
Neutro Abs: 2.3 10*3/uL (ref 1.7–7.7)
Neutrophils Relative %: 67 %
Platelets: 174 10*3/uL (ref 150–400)
RBC: 3.92 MIL/uL (ref 3.87–5.11)
RDW: 13.8 % (ref 11.5–15.5)
WBC: 3.3 10*3/uL — ABNORMAL LOW (ref 4.0–10.5)
nRBC: 0 % (ref 0.0–0.2)

## 2023-07-05 LAB — COMPREHENSIVE METABOLIC PANEL
ALT: 11 U/L (ref 0–44)
AST: 20 U/L (ref 15–41)
Albumin: 3.4 g/dL — ABNORMAL LOW (ref 3.5–5.0)
Alkaline Phosphatase: 40 U/L (ref 38–126)
Anion gap: 11 (ref 5–15)
BUN: 47 mg/dL — ABNORMAL HIGH (ref 8–23)
CO2: 21 mmol/L — ABNORMAL LOW (ref 22–32)
Calcium: 9.6 mg/dL (ref 8.9–10.3)
Chloride: 107 mmol/L (ref 98–111)
Creatinine, Ser: 3.21 mg/dL — ABNORMAL HIGH (ref 0.44–1.00)
GFR, Estimated: 15 mL/min — ABNORMAL LOW (ref >60)
Glucose, Bld: 104 mg/dL — ABNORMAL HIGH (ref 70–99)
Potassium: 4.5 mmol/L (ref 3.5–5.1)
Sodium: 139 mmol/L (ref 135–145)
Total Bilirubin: 0.5 mg/dL (ref 0.0–1.2)
Total Protein: 8.8 g/dL — ABNORMAL HIGH (ref 6.5–8.1)

## 2023-07-05 LAB — URINALYSIS, ROUTINE W REFLEX MICROSCOPIC
Bilirubin Urine: NEGATIVE
Glucose, UA: 50 mg/dL — AB
Ketones, ur: NEGATIVE mg/dL
Leukocytes,Ua: NEGATIVE
Nitrite: NEGATIVE
Protein, ur: 100 mg/dL — AB
RBC / HPF: >50 RBC/hpf (ref 0–5)
Specific Gravity, Urine: 1.013 (ref 1.005–1.030)
pH: 5 (ref 5.0–8.0)

## 2023-07-05 LAB — I-STAT CG4 LACTIC ACID, ED
Lactic Acid, Venous: 2 mmol/L (ref 0.5–1.9)
Lactic Acid, Venous: 2.1 mmol/L (ref 0.5–1.9)

## 2023-07-05 LAB — PROTIME-INR
INR: 1.3 — ABNORMAL HIGH (ref 0.8–1.2)
Prothrombin Time: 16.6 s — ABNORMAL HIGH (ref 11.4–15.2)

## 2023-07-05 LAB — APTT: aPTT: 45 s — ABNORMAL HIGH (ref 24–36)

## 2023-07-05 LAB — HEPARIN LEVEL (UNFRACTIONATED): Heparin Unfractionated: >1.1 [IU]/mL — ABNORMAL HIGH (ref 0.30–0.70)

## 2023-07-05 MED ORDER — LACTATED RINGERS IV SOLN: INTRAVENOUS | Status: DC

## 2023-07-05 MED ORDER — HYDRALAZINE HCL 25 MG PO TABS: 50.0000 mg | ORAL_TABLET | Freq: Every day | ORAL | Status: DC

## 2023-07-05 MED ORDER — ROSUVASTATIN CALCIUM 5 MG PO TABS
5.0000 mg | ORAL_TABLET | Freq: Every day | ORAL | Status: DC
  Filled 2023-07-05: qty 1

## 2023-07-05 MED ORDER — AMLODIPINE BESYLATE 5 MG PO TABS: 10.0000 mg | ORAL_TABLET | Freq: Every day | ORAL | Status: DC

## 2023-07-05 MED ORDER — METOPROLOL SUCCINATE ER 50 MG PO TB24: 50.0000 mg | ORAL_TABLET | Freq: Every day | ORAL | Status: DC

## 2023-07-05 MED ORDER — SODIUM CHLORIDE 0.9 % IV BOLUS
1000.0000 mL | Freq: Once | INTRAVENOUS | Status: AC
  Administered 2023-07-05: 1000 mL via INTRAVENOUS

## 2023-07-05 MED ORDER — HEPARIN (PORCINE) 25000 UT/250ML-% IV SOLN
1200.0000 [IU]/h | INTRAVENOUS | Status: DC
  Administered 2023-07-05: 1050 [IU]/h via INTRAVENOUS
  Filled 2023-07-05: qty 250

## 2023-07-05 MED ORDER — LEVOTHYROXINE SODIUM 25 MCG PO TABS
25.0000 ug | ORAL_TABLET | Freq: Every day | ORAL | Status: DC
  Administered 2023-07-06: 25 ug via ORAL
  Filled 2023-07-05: qty 1

## 2023-07-05 NOTE — Progress Notes (Signed)
 PHARMACY - ANTICOAGULATION CONSULT NOTE  Pharmacy Consult for Heparin  Indication: DVT  No Known Allergies  Patient Measurements: Height: 5' 5 (165.1 cm) Weight: 77.3 kg (170 lb 6.7 oz) IBW/kg (Calculated) : 57 Heparin  Dosing Weight: 73 kg  Vital Signs: Temp: 98.7 F (37.1 C) (02/05 1552) Temp Source: Oral (02/05 1552) BP: 110/52 (02/05 1552) Pulse Rate: 72 (02/05 1552)  Labs: Recent Labs    07/05/23 1125  HGB 10.4*  HCT 34.2*  PLT 174  LABPROT 16.6*  INR 1.3*  CREATININE 3.21*    Estimated Creatinine Clearance: 16 mL/min (A) (by C-G formula based on SCr of 3.21 mg/dL (H)).   Medical History: Past Medical History:  Diagnosis Date   CHF (congestive heart failure) (HCC)    Hypertension    Hypothyroidism 03/29/2017   Obesity (BMI 30-39.9)    Premature ventricular contractions     Medications:  (Not in a hospital admission)  Scheduled:   amLODipine   10 mg Oral Daily   hydrALAZINE   50 mg Oral Daily   [START ON 07/06/2023] levothyroxine   25 mcg Oral QAC breakfast   metoprolol  succinate  50 mg Oral Daily   rosuvastatin   5 mg Oral Daily   Infusions:   lactated ringers       Assessment: 72 yoF admitted on 2/5 with concern for blistering rash (r/o SJS/TEN) and AKI.  Pharmacy is consulted to dose Heparin  IV for DVT.    Prior to admission Apixaban  with last dose on 07/04/23 at 9pm. Baseline coag:  Expect heparin  levels will be falsely elevated d/t recent DOAC use and will use aPTT for monitoring/dosing.  SCr 3.21 CBC: Hgb low/stable at 10.4, Plt 174  Goal of Therapy:  Heparin  level 0.3-0.7 units/ml aPTT 66-102 seconds Monitor platelets by anticoagulation protocol: Yes   Plan:  No bolus d/t recent DOAC use.   Start heparin  IV infusion at 1050 units/hr aPTT 8 hours after starting Daily aPTT, heparin  level, and CBC  Wanda Hasting PharmD, BCPS [facility] main pharmacy [phone] 07/05/2023 6:30 PM

## 2023-07-05 NOTE — Progress Notes (Signed)
 RLE venous duplex has been completed.  Preliminary results given to Dr. Aldean Amass.   Results can be found under chart review under CV PROC. 07/05/2023 3:13 PM Cheyenne Bordeaux RVT, RDMS

## 2023-07-05 NOTE — ED Notes (Signed)
 Turkey/Ham sandwich given to pt.

## 2023-07-05 NOTE — ED Provider Notes (Signed)
  Physical Exam  BP (!) 97/45   Pulse 71   Temp 97.6 F (36.4 C) (Oral)   Resp 19   Ht 5' 5 (1.651 m)   Wt 77.3 kg   SpO2 97%   BMI 28.36 kg/m   Physical Exam  Procedures  Procedures  ED Course / MDM    Medical Decision Making Amount and/or Complexity of Data Reviewed Labs: ordered.  Risk Prescription drug management. Decision regarding hospitalization.   Assuming care of patient from Dr. Freddi   Patient in the ED for blisters. She is a 73 y.o. female with medical history significant of bilateral lower extremity DVT's on eliquis , hypertension, diastolic heart failure, hypothyroidism and was recently admitted to the hospital for elevated dimer -with subsequent negative VQ scan.  Workup thus far shows elevated creatinine in addition to blisters. Dermatology was consulted at [facility] health, they indicated that patient will be seen promptly this week in the clinic, she does not need to be transferred to [facility] or admitted to [facility] for this dermatologic issue.  However, she has elevated creatinine, chronic kidney disease and is currently on medications that are renally cleared  Medicine service was consulted for admission, with the plan to discharge her with Derm follow-up upon discharge at [facility], but they are apprehensive about admitting this patient.  1 L IV fluid ordered.  Medicine will see the patient as consult.           7:47 PM I spoke with transfer center. They informed me, that Dr. Donnia and Dr. Elige had a long conversation and Dr. Donnia mentioned that this is most likely not Elspeth Louder syndrome. They will get the patient in the clinic as soon as possible upon discharge, patient will need to call transfer line or directly to the dermatology clinic.  Medicine service is not comfortable admitting the patient.  They have assessed the patient.  Plan will be for patient to be likely discharged tomorrow from the ER  with the intent that she gets follow-up with dermatology clinic this week.  I have discussed this plan with the patient and his daughter, while she was on the phone.     Charlyn Sora, MD 07/05/23 [phone]

## 2023-07-05 NOTE — ED Triage Notes (Signed)
 Patient is here for evaluation of right leg blistering, redness, swelling and feeling hot to the touch. Patient's left leg has some redness as well. Blistering noted to the right hand and bottom lip. Sent over from PCP.

## 2023-07-05 NOTE — Consult Note (Addendum)
 Initial Consultation Note   Patient: Michaela Baker FMW:[id number] DOB: 03/03/1951 PCP: System, Provider Not In DOA: 07/05/2023 DOS: the patient was seen and examined on 07/05/2023 Primary service: Michaela Sora, MD  Referring physician: Freddi Reason for consult: AKI  Assessment/Plan: Assessment and Plan:  Addendum 2/6: patient discharged from the ED before I was able to reassess.  With regards to the age indeterminate DVT below, discussed with vascular given concern regarding possible new clots.  Vascular noted unclear whether clots new or chronic based on his review of the last few ultrasounds, could consider repeat US  in 1 month to document stability.  With uncertainty, will not recommend any change in eliquis  at this time.  I called patients daughter (this was after the patient's discharge) to discuss recommendation to follow up repeat US  of LE in about Michaela Baker month, continue eliquis  for now.  I also recommended continuing to hold lasix /farxiga /kerendia until outpatient PCP follow up for repeat labs (kidney function not to baseline and BP ok).  Also recommended repeat UA outpatient to follow microscopic hematuria.   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   Recently completed course of doxycycline  (which has been associated with SJS) Her other medications are chronic, I don't see any clearly associated with SJS Per EDP discussion with Dermatology, they noted this could be seen tomorrow in clinic.   ED is requesting admission for AKI below, but if this patient is admitted, I believe she'd be best served in [facility] hospital with dermatology available.   I've requested EDP to follow up with [facility] regarding transfer for dermatology evaluation/follow up.  If creatinine is improved in AM and she's tolerating PO, could consider discharge with plan to follow up in Dermatology clinic same day given the initial  recommendations from dermatology.  Acute Kidney Injury on CKD IV Sudden decline after 11/2022 in renal function (recent baseline 2.1-2.6) At discharge during last hospitalization, creatinine was 2.15 -> 3.21 here today Hold lasix , farxiga , kerendia IVF Renal US  done previous hospitalization, with bilateral renal cyst (no need to repeat if it improves with IVF)  History of DVT Age Indeterminate DVT of Right Femoral Vein and Right Popliteal Vein Heparin  gtt for now Looks like she's developed DVT while on eliquis  -> will transition to heparin  for now, will need discussion with hematology prior to discharge  Microscopic Hematuria Needs follow up outpatient   Anemia Mild, trend   Hypertension Continue amlodipine , metoprolol , hydralazine  Holding kerendia and lasix   Hypothyroidism Synthroid    Dyslipidemia Crestor   TRH will continue to follow the patient.  HPI: Michaela Baker is Michaela Baker 73 y.o. female with past medical history of bilateral LE DVT's on eliquis , hypertension, HFpEF, hypothyroidism and other medical issues who is presenting due to right leg blistering.    History Michaela Baker bit limited as she did not describe the sequence/timing of events particularly well, but she notes her symptoms started sometime after she was discharged from the hospital on 06/28/2023.  She was discharged and treated for community acquired pneumonia with doxycycline .  She notes her rash started sometime after she started the antibiotics, but is unable to clearly describe if this was early or late in the course.  She notes painful lesions that progress to blister.  They're now all over, mostly on her extremities and face.  Denies fevers, chills, chest pain, shortness of breath, abdominal pain, nausea/vomiting, vaginal/rectal involvement.   Review of Systems: As mentioned  in the history of present illness. All other systems reviewed and are negative. Past Medical History:  Diagnosis Date   CHF (congestive heart failure)  (HCC)    Hypertension    Hypothyroidism 03/29/2017   Obesity (BMI 30-39.9)    Premature ventricular contractions    Past Surgical History:  Procedure Laterality Date   ABDOMINAL HYSTERECTOMY     CHOLECYSTECTOMY     COLONOSCOPY WITH PROPOFOL  N/[name] 12/29/2021   Procedure: COLONOSCOPY WITH PROPOFOL ;  Surgeon: Elicia Claw, MD;  Location: [facility] ENDOSCOPY;  Service: Gastroenterology;  Laterality: N/[name];   ENDOVENOUS ABLATION SAPHENOUS VEIN W/ LASER Right 01/26/2022   endovenous laser ablation right greater saphenous vein by Medford Blade MD   TONSILLECTOMY     TUBAL LIGATION     Social History:  reports that she has never smoked. She has never used smokeless tobacco. She reports that she does not drink alcohol and does not use drugs.  No Known Allergies  Family History  Problem Relation Age of Onset   Diabetes Mother    Heart failure Mother    Kidney failure Mother    Cirrhosis Father    Kidney failure Sister    Diabetes Sister    Heart attack Sister    Breast cancer Neg Hx     Prior to Admission medications   Medication Sig Start Date End Date Taking? Authorizing Provider  acetaminophen  (TYLENOL ) 500 MG tablet Take 1,000 mg by mouth every 8 (eight) hours as needed (for pain).    [provider]  amLODipine  (NORVASC ) 10 MG tablet Take 10 mg by mouth daily. 02/04/20   [provider]  apixaban  (ELIQUIS ) 5 MG TABS tablet Take 1 tablet (5 mg total) by mouth 2 (two) times daily for 3 days. 06/28/23 07/05/23  Gherghe, Costin M, MD  dapagliflozin  propanediol (FARXIGA ) 10 MG TABS tablet Take 10 mg by mouth daily.    [provider]  furosemide  (LASIX ) 40 MG tablet Take 40 mg by mouth in the morning and at bedtime. 11/21/22   [provider]  hydrALAZINE  (APRESOLINE ) 50 MG tablet Take 50 mg by mouth daily.    [provider]  KERENDIA 10 MG TABS Take 10 mg by mouth daily.    [provider]  levothyroxine  (SYNTHROID , LEVOTHROID) 25 MCG tablet  Take 25 mcg by mouth daily before breakfast. 02/05/15   [provider]  metoprolol  succinate (TOPROL -XL) 50 MG 24 hr tablet Take 50 mg by mouth daily. Take with or immediately following Sisto Granillo meal.    [provider]  Multiple Vitamins-Minerals (MULTIVITAMIN WITH MINERALS) tablet Take 1 tablet by mouth daily with breakfast.    [provider]  potassium chloride  SA (KLOR-CON  M) 20 MEQ tablet Take 1 tablet (20 mEq total) by mouth daily. 12/25/22   Pokhrel, Vernal, MD  rosuvastatin  (CRESTOR ) 5 MG tablet Take 5 mg by mouth daily. 05/07/21   [provider]    Physical Exam: Vitals:   07/05/23 1434 07/05/23 1500 07/05/23 1507 07/05/23 1552  BP:  (!) 97/45  (!) 110/52  Pulse:  71 71 72  Resp:  19  19  Temp:    98.7 F (37.1 C)  TempSrc:    Oral  SpO2: 100% 97% 97% 97%  Weight:      Height:       General: No acute distress. Cardiovascular: Heart sounds show Ariah Mower regular rate, and rhythm. No gallops or rubs. No murmurs. No JVD. Lungs: Clear to auscultation bilaterally with good air  movement. No rales, rhonchi or wheezes. Abdomen: Soft, nontender, nondistended with normal active bowel sounds. No masses. No hepatosplenomegaly. Neurological: Alert and oriented 3. Moves all extremities 4 with equal strength. Cranial nerves II through XII grossly intact. Skin: see images under media -> extensive blistering to R foot, scattered blisters to RLE, erythematous patches to bilateral LE's, erythematous nodules with dusky centers - erythema to R>L palm - ulcerations to lib Extremities: right lower extremity swelling  Data Reviewed:   Mildly elevated lactic acid (2.0, 2.1) Co2 21, glucose 104, BUN 47, creatinine 3.21, albumin  3.4, total protein 8.8, white blood cell 3.3, hemoglobin 10.4 INR 1.3 CXR without active cardiopulmonary disease Blood cultures pending Age indeterminate DVT involving the right femoral vein and right popliteal vein     Family Communication: called  daughter Primary team communication: discussed with ED Thank you very much for involving us  in the care of your patient.  Author: Meliton Monte, MD 07/05/2023 5:54 PM  For on call review [email or web address].

## 2023-07-05 NOTE — ED Provider Triage Note (Signed)
 Emergency Medicine Provider Triage Evaluation Note  Michaela Baker , a 73 y.o. female  was evaluated in triage.  Pt complains of blisters.  Recently admitted for sepsis, and was discharged a week ago for cp, was given heparin  in the hospital and d/c with doxycycline .  Developed widespread blisters throughout body several days later.  No fever, no increased sp or sob.  Had similar blisters after receiving heparin  in the past according to family member  Review of Systems  Positive: As above Negative: As above  Physical Exam  BP 108/60 (BP Location: Left Arm)   Pulse 73   Temp 97.6 F (36.4 C) (Oral)   Resp 15   Ht 5' 5 (1.651 m)   Wt 77.3 kg   SpO2 100%   BMI 28.36 kg/m  Gen:   Awake, no distress   Resp:  Normal effort  MSK:   Moves extremities without difficulty  Other:  Widesprea blisters and desquamation of the skin in mouth, lips, body, legs.  R leg edema with weeping bulla  Medical Decision Making  Medically screening exam initiated at 11:21 AM.  Appropriate orders placed.  SCHWANDA ZIMA was informed that the remainder of the evaluation will be completed by another provider, this initial triage assessment does not replace that evaluation, and the importance of remaining in the ED until their evaluation is complete.  Concerns for SJS or TEN.  Care discussed with Dr. Freddi Camp, Lon[place], PA-C 07/05/23 1125

## 2023-07-05 NOTE — ED Provider Notes (Addendum)
 [place] EMERGENCY DEPARTMENT AT [facility] ([place] Provider Note   CSN: [id number] Arrival date & time: 07/05/23  1050     History  Chief Complaint  Patient presents with   Blister    Michaela Baker is a 73 y.o. female.  HPI 73 year old female presents with blisters.  She first noticed these about 4 days ago.  She was admitted to the hospital on heparin  last week for chest pain and possible PE.  She was also discharged with antibiotics (doxycycline ) for pneumonia.  The blisters seem to be spreading and she has had some lesions on her lips and mouth that have made it painful to eat/drink.  The worst of thing is in the right lower leg.  However she had a blister that seem to have ruptured near her left nose, has nonspecific skin lesion on her left index finger, and some loss of skin on her right palm.  Home Medications Prior to Admission medications   Medication Sig Start Date End Date Taking? Authorizing Provider  acetaminophen  (TYLENOL ) 500 MG tablet Take 1,000 mg by mouth every 8 (eight) hours as needed (for pain).    [provider]  amLODipine  (NORVASC ) 10 MG tablet Take 10 mg by mouth daily. 02/04/20   [provider]  apixaban  (ELIQUIS ) 5 MG TABS tablet Take 1 tablet (5 mg total) by mouth 2 (two) times daily for 3 days. 06/28/23 07/01/23  Gherghe, Costin M, MD  dapagliflozin  propanediol (FARXIGA ) 10 MG TABS tablet Take 10 mg by mouth daily.    [provider]  furosemide  (LASIX ) 40 MG tablet Take 40 mg by mouth in the morning and at bedtime. 11/21/22   [provider]  hydrALAZINE  (APRESOLINE ) 50 MG tablet Take 50 mg by mouth daily.    [provider]  KERENDIA 10 MG TABS Take 10 mg by mouth daily.    [provider]  levothyroxine  (SYNTHROID , LEVOTHROID) 25 MCG tablet Take 25 mcg by mouth daily before breakfast. 02/05/15   [provider]  metoprolol  succinate (TOPROL -XL) 50 MG 24 hr tablet Take 50 mg by mouth daily.  Take with or immediately following a meal.    [provider]  Multiple Vitamins-Minerals (MULTIVITAMIN WITH MINERALS) tablet Take 1 tablet by mouth daily with breakfast.    [provider]  potassium chloride  SA (KLOR-CON  M) 20 MEQ tablet Take 1 tablet (20 mEq total) by mouth daily. 12/25/22   Pokhrel, Laxman, MD  rosuvastatin  (CRESTOR ) 5 MG tablet Take 5 mg by mouth daily. 05/07/21   [provider]      Allergies    Patient has no known allergies.    Review of Systems   Review of Systems  Cardiovascular:  Positive for leg swelling.  Skin:  Positive for wound.       bilsters    Physical Exam Updated Vital Signs BP (!) 97/45   Pulse 71   Temp 97.6 F (36.4 C) (Oral)   Resp 19   Ht 5' 5 (1.651 m)   Wt 77.3 kg   SpO2 97%   BMI 28.36 kg/m  Physical Exam Vitals and nursing note reviewed.  Constitutional:      Appearance: She is well-developed.  HENT:     Head: Normocephalic and atraumatic.  Cardiovascular:     Rate and Rhythm: Normal rate and regular rhythm.     Heart sounds: Normal heart sounds.  Pulmonary:     Effort: Pulmonary effort is normal.  Breath sounds: Normal breath sounds.  Abdominal:     Palpations: Abdomen is soft.     Tenderness: There is no abdominal tenderness.  Skin:    General: Skin is warm and dry.     Comments: See pictures. Right leg is diffusely warm, erythematous with blisters. Is tender to touch.  Lower and upper lips with some breakdown of mucosa Right inner cheek with a possible lesion on mucosa Right palmar hand seems to have some loss of skin, no blistering  Neurological:     Mental Status: She is alert.          ED Results / Procedures / Treatments   Labs (all labs ordered are listed, but only abnormal results are displayed) Labs Reviewed  COMPREHENSIVE METABOLIC PANEL - Abnormal; Notable for the following components:      Result Value   CO2 21 (*)    Glucose, Bld 104 (*)    BUN 47 (*)     Creatinine, Ser 3.21 (*)    Total Protein 8.8 (*)    Albumin  3.4 (*)    GFR, Estimated 15 (*)    All other components within normal limits  CBC WITH DIFFERENTIAL/PLATELET - Abnormal; Notable for the following components:   WBC 3.3 (*)    Hemoglobin 10.4 (*)    HCT 34.2 (*)    All other components within normal limits  PROTIME-INR - Abnormal; Notable for the following components:   Prothrombin Time 16.6 (*)    INR 1.3 (*)    All other components within normal limits  I-STAT CG4 LACTIC ACID, ED - Abnormal; Notable for the following components:   Lactic Acid, Venous 2.0 (*)    All other components within normal limits  I-STAT CG4 LACTIC ACID, ED - Abnormal; Notable for the following components:   Lactic Acid, Venous 2.1 (*)    All other components within normal limits  CULTURE, BLOOD (ROUTINE X 2)  CULTURE, BLOOD (ROUTINE X 2)  URINALYSIS, ROUTINE W REFLEX MICROSCOPIC    EKG None  Radiology VAS US  LOWER EXTREMITY VENOUS (DVT) (7a-7p) Result Date: 07/05/2023  Lower Venous DVT Study Patient Name:  CYRAH MCLAMB  Date of Exam:   07/05/2023 Medical Rec #: [id number]      Accession #:    [id number] Date of Birth: [birth year]      Patient Gender: F Patient Age:   65 years Exam Location:  [facility] Procedure:      VAS US  LOWER EXTREMITY VENOUS (DVT) Referring Phys: GLENDIA Merwin Breden --------------------------------------------------------------------------------  Indications: Edema.  Risk Factors: BLE DVT 12/19/2022. Anticoagulation: Eliquis  (start 06/28/23 end 07/01/23)??SABRA Limitations: Poor ultrasound/tissue interface. Comparison Study: Previous exam on 06/28/23 was positive for DVT in RLE PopV (age                   indeterminate) & LLE FV (chronic) Performing Technologist: Ezzie Potters RVT, RDMS  Examination Guidelines: A complete evaluation includes B-mode imaging, spectral Doppler, color Doppler, and power Doppler as needed of all accessible portions of each vessel. Bilateral testing is  considered an integral part of a complete examination. Limited examinations for reoccurring indications may be performed as noted. The reflux portion of the exam is performed with the patient in reverse Trendelenburg.  +---------+---------------+---------+-----------+----------+-----------------+ RIGHT    CompressibilityPhasicitySpontaneityPropertiesThrombus Aging    +---------+---------------+---------+-----------+----------+-----------------+ CFV      Full                                                           +---------+---------------+---------+-----------+----------+-----------------+  SFJ      Full                                                           +---------+---------------+---------+-----------+----------+-----------------+ FV Prox  Partial        Yes      Yes                  Age Indeterminate +---------+---------------+---------+-----------+----------+-----------------+ FV Mid   Partial        Yes      Yes                  Age Indeterminate +---------+---------------+---------+-----------+----------+-----------------+ FV DistalPartial        Yes      Yes                  Age Indeterminate +---------+---------------+---------+-----------+----------+-----------------+ PFV      Full                                                           +---------+---------------+---------+-----------+----------+-----------------+ POP      Partial        Yes      Yes                  Age Indeterminate +---------+---------------+---------+-----------+----------+-----------------+ PTV      Full                                                           +---------+---------------+---------+-----------+----------+-----------------+ PERO     Full                                                           +---------+---------------+---------+-----------+----------+-----------------+    +----+---------------+---------+-----------+----------+--------------+ LEFTCompressibilityPhasicitySpontaneityPropertiesThrombus Aging +----+---------------+---------+-----------+----------+--------------+ CFV Full           Yes      Yes                                 +----+---------------+---------+-----------+----------+--------------+    Summary: RIGHT: - Findings consistent with age indeterminate deep vein thrombosis involving the right femoral vein, and right popliteal vein.  - No cystic structure found in the popliteal fossa.  LEFT: - No evidence of common femoral vein obstruction.   *See table(s) above for measurements and observations.    Preliminary    DG Chest 2 View Result Date: 07/05/2023 CLINICAL DATA:  Shortness of breath. EXAM: CHEST - 2 VIEW COMPARISON:  06/27/2023. FINDINGS: Bilateral lung fields are clear. Bilateral costophrenic angles are clear. Normal cardio-mediastinal silhouette. No acute osseous abnormalities. The soft tissues are within normal limits. IMPRESSION: No active cardiopulmonary disease. Electronically Signed   By: Ree Molt M.D.   On: 07/05/2023 14:13  Procedures Procedures    Medications Ordered in ED Medications  sodium chloride  0.9 % bolus 1,000 mL (1,000 mLs Intravenous New Bag/Given 07/05/23 1404)    ED Course/ Medical Decision Making/ A&P                                 Medical Decision Making Amount and/or Complexity of Data Reviewed Labs: ordered.    Details: Acute on chronic kidney injury Leukopenia, similar to last week Radiology: ordered and independent interpretation performed.    Details: No CHF  Risk Decision regarding hospitalization.   Patient presents for blisters. She has some lip lesions concerning for possible SJS/TEN.  However, she is otherwise well-appearing.  I suspect that due to the pain it causes for her to eat and drink due to her lips, she has developed an AKI from poor p.o. intake.  She was started on  some IV fluids.  I think she needs to hold her Lasix  for at least a dose or 2.    I discussed with dermatology at [facility], Dr. Donnia, who recommends that the patient could be seen next day in their clinic but does not need to be admitted to the hospital.  Due to this, even if she needs to be admitted for an AKI she would not need to be transferred as there would be no emergent dermatologic reason to admit.  Thus I discussed with the hospitalist at [facility], but they are uncomfortable admitting her to the hospital due to the blisters as the primary cause.  Of note, DVT study shows DVT but this seems unchanged from last week, which seemed like a more chronic DVT.  Care transferred to Dr. Charlyn.    When she gets discharged, please call [facility] to get her next day follow up with Dermatology.     Final Clinical Impression(s) / ED Diagnoses Final diagnoses:  Acute kidney injury superimposed on chronic kidney disease [facility])    Rx / DC Orders ED Discharge Orders     None          Freddi Hamilton, MD 07/05/23 [phone]

## 2023-07-06 LAB — BASIC METABOLIC PANEL
Anion gap: 10 (ref 5–15)
BUN: 43 mg/dL — ABNORMAL HIGH (ref 8–23)
CO2: 19 mmol/L — ABNORMAL LOW (ref 22–32)
Calcium: 8.4 mg/dL — ABNORMAL LOW (ref 8.9–10.3)
Chloride: 108 mmol/L (ref 98–111)
Creatinine, Ser: 2.74 mg/dL — ABNORMAL HIGH (ref 0.44–1.00)
GFR, Estimated: 18 mL/min — ABNORMAL LOW (ref >60)
Glucose, Bld: 79 mg/dL (ref 70–99)
Potassium: 4 mmol/L (ref 3.5–5.1)
Sodium: 137 mmol/L (ref 135–145)

## 2023-07-06 LAB — APTT: aPTT: 53 s — ABNORMAL HIGH (ref 24–36)

## 2023-07-06 NOTE — Progress Notes (Signed)
 PHARMACY - ANTICOAGULATION CONSULT NOTE  Pharmacy Consult for Heparin  Indication: DVT  Allergies  Allergen Reactions   Doxycycline  Other (See Comments)    Blisters, possibly- was on heparin  at the same time    Patient Measurements: Height: 5' 5 (165.1 cm) Weight: 77.3 kg (170 lb 6.7 oz) IBW/kg (Calculated) : 57 Heparin  Dosing Weight: Michaela kg  Vital Signs: Temp: 97.9 F (36.6 C) (02/06 0400) Temp Source: Oral (02/06 0400) BP: 105/58 (02/06 0400) Pulse Rate: 65 (02/06 0400)  Labs: Recent Labs    07/05/23 1125 07/05/23 1950 07/06/23 0430  HGB 10.4*  --   --   HCT 34.2*  --   --   PLT 174  --   --   APTT  --  45* 53*  LABPROT 16.6*  --   --   INR 1.3*  --   --   HEPARINUNFRC  --  >1.10*  --   CREATININE 3.21*  --   --     Estimated Creatinine Clearance: 16 mL/min (A) (by C-G formula based on SCr of 3.21 mg/dL (H)).   Medical History: Past Medical History:  Diagnosis Date   CHF (congestive heart failure) (HCC)    Hypertension    Hypothyroidism 03/29/2017   Obesity (BMI 30-39.9)    Premature ventricular contractions     Medications:  (Not in a hospital admission)  Scheduled:   amLODipine   10 mg Oral Daily   hydrALAZINE   50 mg Oral Daily   levothyroxine   25 mcg Oral QAC breakfast   metoprolol  succinate  50 mg Oral Daily   rosuvastatin   5 mg Oral Daily   Infusions:   heparin  1,050 Units/hr (07/06/23 0431)   lactated ringers  125 mL/hr at 07/05/23 2355    Assessment: Michaela Baker admitted on 2/5 with concern for blistering rash (r/o SJS/TEN) and AKI.  Pharmacy is consulted to dose Heparin  IV for DVT.    Prior to admission Apixaban  with last dose on 07/04/23 at 9pm. Baseline coag:  Expect heparin  levels will be falsely elevated d/t recent DOAC use and will use aPTT for monitoring/dosing.  SCr 3.21 CBC: Hgb low/stable at 10.4, Plt 174  07/06/2023 aPTT 53 subtherapeutic on 1050 units/hr Per RN no bleeding noted  Goal of Therapy:  Heparin  level 0.3-0.7  units/ml aPTT 66-102 seconds Monitor platelets by anticoagulation protocol: Yes   Plan:  Increase heparin  drip to 1200 units/hr aPTT in 8 hours Daily aPTT, heparin  level, and CBC  Leeroy Mace RPh 07/06/2023, 4:56 AM

## 2023-07-06 NOTE — Discharge Instructions (Signed)
 Your kidney function has improved somewhat overnight.  Continue to try to increase your fluid intake at home.  Please follow-up with your family doctor and nephrologist in the office.

## 2023-07-06 NOTE — ED Provider Notes (Signed)
 Received patient in turnover from Dr. Charlyn.  Please see their note for further details of Hx, PE.  Briefly patient is a 73 y.o. female with a Blister .  Case has been discussed with specialist at [facility].  Thought to not be Stevens-Johnson's and not to warrant emergent transfer.  Unfortunately due to concern for Stevens-Johnson's here by the hospitalist will not admit for AKI.  Plan to hydrate and recheck metabolic panel in the morning.  Renal function has improved somewhat on repeat check this morning.  Creatinine much closer to baseline.  I discussed the results with the patient.  She would like to try and go home at this time.    Emil Share, DO 07/06/23 [phone]

## 2023-07-11 LAB — CULTURE, BLOOD (ROUTINE X 2)
Culture: NO GROWTH
Culture: NO GROWTH

## 2023-08-28 ENCOUNTER — Ambulatory Visit: Admitting: Emergency Medicine

## 2023-08-28 NOTE — Progress Notes (Deleted)
  Cardiology Office Note:    Date:  08/28/2023  ID:  Michaela Baker, DOB Feb 28, 1951, MRN [id number] PCP: System, Provider Not In   HeartCare Providers Cardiologist:  Maisie Fus, MD { Click to update primary MD,subspecialty MD or APP then REFRESH:1}    {Click to Open Review  :1}   Patient Profile:      Chief Complaint: *** History of Present Illness:  Michaela Baker is a 73 y.o. female with visit-pertinent history of hypothyroidism, hypertension, CKD stage III, H/O DVT on Eliquis, HFpEF, pulmonary hypertension, OSA, frequent PVCs, hyperlipidemia  She was diagnosed with congestive heart failure in 2013 during a hospitalization in [facility].  Echocardiogram 02/2017 with normal LVEF.  Lexiscan stress test 01/2020 was normal low risk study without evidence of ischemia.  Echocardiogram 01/2020 with normal LVEF.  Heart monitor 01/2020 showed PVC burden of 22%.  She was started on beta-blocker however continued to have dyspnea and persistent PVCs.  She was referred to EP for ablation on 03/24/2020.  She saw Dr. Johney Frame, her PVC burden was low then and plan was to encourage weight loss and CPAP compliance.  She declined CPAP utilization.  Further she did not wish to consider AAD's or ablation at the time.  Echocardiogram 11/2022 showed LVEF 60 to 65%, no RWMA, grade 1 diastolic dysfunction, RV function normal, RV size mildly enlarged, severely elevated PASP at 61 mmHg, mild MR, mild to moderate TR.  She was last seen in clinic on 03/01/2023 by Dr. Wyline Mood.  She is without any cardiovascular concerns or complaints at that time.  She was to follow-up in 3 months. Discussed the use of AI scribe software for clinical note transcription with the patient, who gave verbal consent to proceed.  History of Present Illness            Review of systems:  Please see the history of present illness. All other systems are reviewed and otherwise negative. ***     Home Medications:    No  outpatient medications have been marked as taking for the 08/28/23 encounter (Appointment) with Denyce Robert, NP.   Studies Reviewed:       *** Risk Assessment/Calculations:   {Does this patient have ATRIAL FIBRILLATION?:[phone]} No BP recorded.  {Refresh Note OR Click here to enter BP  :1}***       Physical Exam:   VS:  There were no vitals taken for this visit.   Wt Readings from Last 3 Encounters:  07/05/23 170 lb 6.7 oz (77.3 kg)  06/27/23 170 lb 6.4 oz (77.3 kg)  03/01/23 175 lb 12.8 oz (79.7 kg)    GEN: Well nourished, well developed in no acute distress NECK: No JVD; No carotid bruits CARDIAC: ***RRR, no murmurs, rubs, gallops RESPIRATORY:  Clear to auscultation without rales, wheezing or rhonchi  ABDOMEN: Soft, non-tender, non-distended EXTREMITIES:  No edema; No acute deformity ***     Assessment and Plan:  HFpEF  Hypertension  Frequent PVCs  CKD stage IIIb   Chronic deep vein thrombosis Chronic DVT              {Are you ordering a CV Procedure (e.g. stress test, cath, DCCV, TEE, etc)?   Press F2        :[id number]}  Dispo:  No follow-ups on file.  Signed, Denyce Robert, NP

## 2023-08-31 ENCOUNTER — Ambulatory Visit: Attending: Emergency Medicine | Admitting: Emergency Medicine

## 2023-08-31 ENCOUNTER — Encounter: Payer: Self-pay | Admitting: Emergency Medicine

## 2023-08-31 VITALS — BP 134/82 | HR 89 | Ht 65.0 in | Wt 168.0 lb

## 2023-08-31 DIAGNOSIS — I493 Ventricular premature depolarization: Secondary | ICD-10-CM | POA: Diagnosis not present

## 2023-08-31 DIAGNOSIS — I5032 Chronic diastolic (congestive) heart failure: Secondary | ICD-10-CM | POA: Diagnosis not present

## 2023-08-31 DIAGNOSIS — N184 Chronic kidney disease, stage 4 (severe): Secondary | ICD-10-CM

## 2023-08-31 DIAGNOSIS — I10 Essential (primary) hypertension: Secondary | ICD-10-CM

## 2023-08-31 DIAGNOSIS — I825Y9 Chronic embolism and thrombosis of unspecified deep veins of unspecified proximal lower extremity: Secondary | ICD-10-CM | POA: Diagnosis not present

## 2023-08-31 NOTE — Progress Notes (Unsigned)
 Cardiology Office Note:    Date:  08/31/2023  ID:  Michaela Baker, DOB [birth year], MRN [id number] PCP: System, Provider Not In  [facility] Providers Cardiologist:  Maisie Fus, MD { Click to update primary MD,subspecialty MD or APP then REFRESH:1}    {Click to Open Review  :1}   Patient Profile:      Chief Complaint: *** History of Present Illness:  Michaela Baker is a 73 y.o. female with visit-pertinent history of hypothyroidism, hypertension, CKD stage III, H/O DVT on Eliquis, HFpEF, pulmonary hypertension, OSA, frequent PVCs, hyperlipidemia  She was diagnosed with congestive heart failure in 2013 during a hospitalization in [facility].  Echocardiogram 02/2017 with normal LVEF.  Lexiscan stress test 01/2020 was normal low risk study without evidence of ischemia.  Echocardiogram 01/2020 with normal LVEF.  Heart monitor 01/2020 showed PVC burden of 22%.  She was started on beta-blocker however continued to have dyspnea and persistent PVCs.  She was referred to EP for ablation on 03/24/2020.  She saw Dr. Johney Frame, her PVC burden was low then and plan was to encourage weight loss and CPAP compliance.  She declined CPAP utilization.  Further she did not wish to consider AAD's or ablation at the time.  Echocardiogram 11/2022 showed LVEF 60 to 65%, no RWMA, grade 1 diastolic dysfunction, RV function normal, RV size mildly enlarged, severely elevated PASP at 61 mmHg, mild MR, mild to moderate TR.  She was seen in the ED in June 2024 for nonspecific chest pain.  She was given 324 mg ASA load and started on aspirin.  Her vital signs are normal.  Her EKG did not show acute ST-T wave changes.  Her troponins were unremarkable.  She was recommended to see cardiology outpatient.  She established care with cardiology service on 03/01/2023 with Dr. Wyline Mood.  She is without any cardiovascular concerns or complaints at that time.  She was to follow-up in 3 months.  Patient was recently admitted  to the hospital from 07/11/2023 through 07/24/2018 for acute toxic metabolic encephalopathy likely related to underlying autoimmune process.  She met positive lupus criteria.  She had originally presented with increased lethargy and confusion.  Patient's presentation thought to be consistent with lupus flare.  Patient underwent renal biopsy which was not diagnostic of acute lupus nephritis but suggestive of autoimmune complex disease.  Rheumatology voiced their concern for hydralazine induced lupus or vasculitis and recommended discontinuation of hydralazine on discharge.  She remained hypertensive throughout her hospitalization and all of her blood pressure medications were held.  Lasix was held during admission per Dr. Corine Shelter of internal medicine.    Discussed the use of AI scribe software for clinical note transcription with the patient, who gave verbal consent to proceed.  Patient presents to clinic today by herself.  She is without acute cardiovascular concerns or complaints today.  Her altered mental status has seemed to have resolved.  Today she notes that she is doing "great" and has not felt this good in quite some time.  She notes she is not really an active person and typically watches TV throughout most of her day.  She is currently living with her granddaughter and grandson.  She does have chronic right leg lower extremity edema from history of chronic DVT.  She denies any chest pains, dyspnea, orthopnea, PND.  She does wear compression stockings daily.  Review of systems:  Please see the history of present illness. All other systems are reviewed and  otherwise negative.     Home Medications:    Current Meds  Medication Sig   acetaminophen (TYLENOL) 500 MG tablet Take 1,000 mg by mouth every 8 (eight) hours as needed (for pain).   amLODipine (NORVASC) 10 MG tablet Take 10 mg by mouth daily.   dapagliflozin propanediol (FARXIGA) 10 MG TABS tablet Take 10 mg by mouth daily.   folic acid  (FOLVITE) 1 MG tablet Take 1 mg by mouth daily.   gabapentin (NEURONTIN) 300 MG capsule Take 300 mg by mouth daily as needed (for pain).   hydrALAZINE (APRESOLINE) 50 MG tablet Take 50 mg by mouth daily.   KERENDIA 10 MG TABS Take 10 mg by mouth daily.   levothyroxine (SYNTHROID, LEVOTHROID) 25 MCG tablet Take 25 mcg by mouth daily before breakfast.   metoprolol succinate (TOPROL-XL) 50 MG 24 hr tablet Take 50 mg by mouth daily. Take with or immediately following a meal.   potassium chloride SA (KLOR-CON M) 20 MEQ tablet Take 1 tablet (20 mEq total) by mouth daily.   rosuvastatin (CRESTOR) 5 MG tablet Take 5 mg by mouth daily.   [DISCONTINUED] furosemide (LASIX) 40 MG tablet Take 40 mg by mouth in the morning and at bedtime.   Studies Reviewed:       Echocardiogram 12/20/2022 1. Left ventricular ejection fraction, by estimation, is 60 to 65%. Left  ventricular ejection fraction by 2D MOD biplane is 60.6 %. The left  ventricle has normal function. The left ventricle has no regional wall  motion abnormalities. Left ventricular  diastolic parameters are consistent with Grade I diastolic dysfunction  (impaired relaxation).   2. Right ventricular systolic function is normal. The right ventricular  size is mildly enlarged. There is severely elevated pulmonary artery  systolic pressure. The estimated right ventricular systolic pressure is  61.0 mmHg.   3. The mitral valve is degenerative. Mild mitral valve regurgitation. No  evidence of mitral stenosis.   4. Tricuspid valve regurgitation is mild to moderate.   5. The aortic valve is tricuspid. Aortic valve regurgitation is trivial.  No aortic stenosis is present.   6. The inferior vena cava is dilated in size with <50% respiratory  variability, suggesting right atrial pressure of 15 mmHg.  Risk Assessment/Calculations:             Physical Exam:   VS:  BP 134/82   Pulse 89   Ht 5\' 5"  (1.651 m)   Wt 168 lb (76.2 kg)   SpO2 94%   BMI  27.96 kg/m    Wt Readings from Last 3 Encounters:  08/31/23 168 lb (76.2 kg)  07/05/23 170 lb 6.7 oz (77.3 kg)  06/27/23 170 lb 6.4 oz (77.3 kg)    GEN: Well nourished, well developed in no acute distress NECK: No JVD; No carotid bruits CARDIAC: RRR, no murmurs, rubs, gallops RESPIRATORY:  Clear to auscultation without rales, wheezing or rhonchi  ABDOMEN: Soft, non-tender, non-distended EXTREMITIES:  No edema; No acute deformity      Assessment and Plan:  HFpEF Echo 11/2022 with LVEF 60 to 65%, no RWMA, grade 1 DD Lasix was previously held by Dr. Corine Shelter during admission on 07/2023 - Today she is euvolemic and well compensated on exam.  NYHA class I. - She is not currently requiring loop diuretic at this time - Continue Farxiga 10 mg daily  Hypertension Blood pressure today is 130/82.  During her admission on 07/2023 all of her antihypertensive medications were held throughout her hospitalization -Her  blood pressure remains under adequate control today  Frequent PVCs Holter monitor in 2021 with 22% PVC burden.  She discussed with the EP at the time for ablation.  The PVC burden was less and ablation was not recommended.  She was to focus on weight loss and CPAP compliance - Her beta-blocker was recently discontinued - She denies any symptoms of recurrent PVCs today.  DVT Patient initially diagnosed with DVT in 11/2022 Currently on anticoagulation and managed by PCP -Continue Eliquis 5 mg twice daily   AKI / Chronic kidney disease stage IV AKI on recent admission (creatinine 2.54 from baseline of 2).  There was question for lupus flare, had renal biopsy in 07/21/2023 which revealed diffuse glomerulonephritis. - Managed by nephrology, on Kerendia  - Has follow-up later this month        Dispo:  Return in about 4 months (around 12/31/2023).  Signed, Denyce Robert, NP

## 2023-08-31 NOTE — Patient Instructions (Signed)
 Medication Instructions:  STOP LASIX   Lab Work: NONE   Testing/Procedures: NONE  Follow-Up: At [facility], you and your health needs are our priority.  As part of our continuing mission to provide you with exceptional heart care, our providers are all part of one team.  This team includes your primary Cardiologist (physician) and Advanced Practice Providers or APPs (Physician Assistants and Nurse Practitioners) who all work together to provide you with the care you need, when you need it.  Your next appointment:   4 MONTHS  Provider:   Rise Paganini, DNP  We recommend signing up for the patient portal called "MyChart".  Sign up information is provided on this After Visit Summary.  MyChart is used to connect with patients for Virtual Visits (Telemedicine).  Patients are able to view lab/test results, encounter notes, upcoming appointments, etc.  Non-urgent messages can be sent to your provider as well.   To learn more about what you can do with MyChart, go to [email or web address].   Other Instructions:      1st Floor: - Lobby - Registration  - Pharmacy  - Lab - Cafe  2nd Floor: - PV Lab - Diagnostic Testing (echo, CT, nuclear med)  3rd Floor: - Vacant  4th Floor: - TCTS (cardiothoracic surgery) - AFib Clinic - Structural Heart Clinic - Vascular Surgery  - Vascular Ultrasound  5th Floor: - HeartCare Cardiology (general and EP) - Clinical Pharmacy for coumadin, hypertension, lipid, weight-loss medications, and med management appointments    Valet parking services will be available as well.

## 2023-09-01 ENCOUNTER — Encounter: Payer: Self-pay | Admitting: Emergency Medicine

## 2024-02-09 ENCOUNTER — Ambulatory Visit: Attending: Emergency Medicine | Admitting: Emergency Medicine

## 2024-02-09 NOTE — Progress Notes (Deleted)
 Cardiology Office Note:    Date:  02/09/2024  ID:  Michaela Baker, DOB [birth year], MRN [id number] PCP: System, Provider Not In  [facility] Providers Cardiologist:  Alvan Ronal BRAVO, MD (Inactive) { Click to update primary MD,subspecialty MD or APP then REFRESH:1}    {Click to Open Review  :1}   Patient Profile:       Chief Complaint: *** History of Present Illness:  Michaela Baker is a 73 y.o. female with visit-pertinent history of  hypothyroidism, hypertension, CKD stage III, H/O DVT on Eliquis , HFpEF, pulmonary hypertension, OSA, frequent PVCs, hyperlipidemia   She was diagnosed with congestive heart failure in 2013 during a hospitalization in [place] .  Echocardiogram 02/2017 with normal LVEF.  Lexiscan stress test 01/2020 was normal low risk study without evidence of ischemia.  Echocardiogram 01/2020 with normal LVEF.  Heart monitor 01/2020 showed PVC burden of 22%.  She was started on beta-blocker however continued to have dyspnea and persistent PVCs.  She was referred to EP for ablation on 03/24/2020.  She saw Dr. Kelsie, her PVC burden was low then and plan was to encourage weight loss and CPAP compliance.  She declined CPAP utilization.  Further she did not wish to consider AAD's or ablation at the time.   Echocardiogram 11/2022 showed LVEF 60 to 65%, no RWMA, grade 1 diastolic dysfunction, RV function normal, RV size mildly enlarged, severely elevated PASP at 61 mmHg, mild MR, mild to moderate TR.   She was seen in the ED in June 2024 for nonspecific chest pain.  She was given 324 mg ASA load and started on aspirin .  Her vital signs are normal.  Her EKG did not show acute ST-T wave changes.  Her troponins were unremarkable.  She was recommended to see cardiology outpatient.   She established care with cardiology service on 03/01/2023 with Dr. Alvan.  She is without any cardiovascular concerns or complaints at that time.  She was to follow-up in 3 months.   Patient  was recently admitted to the hospital from 07/11/2023 through 07/24/2018 for acute toxic metabolic encephalopathy likely related to underlying autoimmune process.  She met positive lupus criteria.  She had originally presented with increased lethargy and confusion.  Patient's presentation thought to be consistent with lupus flare.  Patient underwent renal biopsy which was not diagnostic of acute lupus nephritis but suggestive of autoimmune complex disease.  Rheumatology voiced their concern for hydralazine  induced lupus or vasculitis and recommended discontinuation of hydralazine  on discharge.  She remained hypotensive throughout her hospitalization and all of her blood pressure medications were held (10 mg amlodipine , 50 mg hydralazine , 50 mg metoprolol  succinate).  Lasix  was held during admission per Dr. Koleen of internal medicine, given that she has not required diuretic therapy during her hospitalization and in the setting of low normal blood pressures.  She was to follow-up with rheumatology as outpatient.  Patient was last seen in office on 08/31/2023.  She was doing well without acute complaints.  She was euvolemic and blood pressure well-controlled.  No changes were made.  Discussed the use of AI scribe software for clinical note transcription with the patient, who gave verbal consent to proceed.  History of Present Illness     Review of systems:  Please see the history of present illness. All other systems are reviewed and otherwise negative. ***      Studies Reviewed:        ***  Risk Assessment/Calculations:   {Does  this patient have ATRIAL FIBRILLATION?:[phone]} No BP recorded.  {Refresh Note OR Click here to enter BP  :1}***        Physical Exam:   VS:  There were no vitals taken for this visit.   Wt Readings from Last 3 Encounters:  08/31/23 168 lb (76.2 kg)  07/05/23 170 lb 6.7 oz (77.3 kg)  06/27/23 170 lb 6.4 oz (77.3 kg)    GEN: Well nourished, well developed in  no acute distress NECK: No JVD; No carotid bruits CARDIAC: ***RRR, no murmurs, rubs, gallops RESPIRATORY:  Clear to auscultation without rales, wheezing or rhonchi  ABDOMEN: Soft, non-tender, non-distended EXTREMITIES:  No edema; No acute deformity ***      Assessment and Plan:    Assessment and Plan Assessment & Plan      {Are you ordering a CV Procedure (e.g. stress test, cath, DCCV, TEE, etc)?   Press F2        :[id number]}  Dispo:  No follow-ups on file.  Signed, Lum LITTIE Louis, NP

## 2024-02-12 DIAGNOSIS — Z1231 Encounter for screening mammogram for malignant neoplasm of breast: Secondary | ICD-10-CM | POA: Diagnosis not present

## 2024-02-12 DIAGNOSIS — R92323 Mammographic fibroglandular density, bilateral breasts: Secondary | ICD-10-CM | POA: Diagnosis not present

## 2024-02-25 DIAGNOSIS — E039 Hypothyroidism, unspecified: Secondary | ICD-10-CM | POA: Diagnosis not present

## 2024-02-25 DIAGNOSIS — E876 Hypokalemia: Secondary | ICD-10-CM | POA: Diagnosis not present

## 2024-02-25 DIAGNOSIS — E785 Hyperlipidemia, unspecified: Secondary | ICD-10-CM | POA: Diagnosis not present

## 2024-03-01 ENCOUNTER — Encounter: Payer: Self-pay | Admitting: Emergency Medicine

## 2024-03-01 ENCOUNTER — Ambulatory Visit: Attending: Emergency Medicine | Admitting: Emergency Medicine

## 2024-03-01 VITALS — BP 116/60 | HR 102 | Ht 65.0 in | Wt 167.0 lb

## 2024-03-01 DIAGNOSIS — I10 Essential (primary) hypertension: Secondary | ICD-10-CM

## 2024-03-01 DIAGNOSIS — I493 Ventricular premature depolarization: Secondary | ICD-10-CM | POA: Diagnosis not present

## 2024-03-01 DIAGNOSIS — I5032 Chronic diastolic (congestive) heart failure: Secondary | ICD-10-CM

## 2024-03-01 DIAGNOSIS — I825Y9 Chronic embolism and thrombosis of unspecified deep veins of unspecified proximal lower extremity: Secondary | ICD-10-CM

## 2024-03-01 DIAGNOSIS — N184 Chronic kidney disease, stage 4 (severe): Secondary | ICD-10-CM

## 2024-03-01 NOTE — Progress Notes (Signed)
 Cardiology Office Note:    Date:  03/01/2024  ID:  [name], DOB 03-Jun-1950, MRN [id number] PCP: System, Provider Not In  [facility] Providers Cardiologist:  Madonna Large, DO Cardiology APP:  Rana Lum CROME, NP       Patient Profile:       Chief Complaint: 29-month follow-up History of Present Illness:  Michaela Baker is a 73 y.o. female with visit-pertinent history of  hypothyroidism, hypertension, CKD stage III, H/O DVT on Eliquis , HFpEF, pulmonary hypertension, OSA, frequent PVCs, hyperlipidemia   She was diagnosed with congestive heart failure in 2013 during a hospitalization in [place] .  Echocardiogram 02/2017 with normal LVEF.  Lexiscan stress test 01/2020 was normal low risk study without evidence of ischemia.  Echocardiogram 01/2020 with normal LVEF.  Heart monitor 01/2020 showed PVC burden of 22%.  She was started on beta-blocker however continued to have dyspnea and persistent PVCs.  She was referred to EP for ablation on 03/24/2020.  She saw Dr. Kelsie, her PVC burden was low then and plan was to encourage weight loss and CPAP compliance.  She declined CPAP utilization.  Further she did not wish to consider AAD's or ablation at the time.   Echocardiogram 11/2022 showed LVEF 60 to 65%, no RWMA, grade 1 diastolic dysfunction, RV function normal, RV size mildly enlarged, severely elevated PASP at 61 mmHg, mild MR, mild to moderate TR.   She was seen in the ED in June 2024 for nonspecific chest pain.  She was given 324 mg ASA load and started on aspirin .  Her vital signs are normal.  Her EKG did not show acute ST-T wave changes.  Her troponins were unremarkable.  She was recommended to see cardiology outpatient.   She established care with cardiology service on 03/01/2023 with Dr. Alvan.  She is without any cardiovascular concerns or complaints at that time.  She was to follow-up in 3 months.   Patient was recently admitted to the hospital from 07/11/2023  through 07/25/2023 for acute toxic metabolic encephalopathy likely related to underlying autoimmune process.  She met positive lupus criteria.  She had originally presented with increased lethargy and confusion.  Patient's presentation thought to be consistent with lupus flare.  Patient underwent renal biopsy which was not diagnostic of acute lupus nephritis but suggestive of autoimmune complex disease.  Rheumatology voiced their concern for hydralazine  induced lupus or vasculitis and recommended discontinuation of hydralazine  on discharge.  She remained hypotensive throughout her hospitalization and all of her blood pressure medications were held (10 mg amlodipine , 50 mg hydralazine , 50 mg metoprolol  succinate).  Lasix  was held during admission per Dr. Koleen of internal medicine given that she has not required diuretic therapy during her hospitalization and in the setting of low normal blood pressures.  She was to follow-up with rheumatology as outpatient.  She was last seen in clinic on 08/31/2023.  She was doing well at the time.  She was euvolemic and well compensated.  No acute complaints.  Blood pressure was well-controlled.  No complaints of palpitations or frequent PVCs.   Discussed the use of AI scribe software for clinical note transcription with the patient, who gave verbal consent to proceed.  History of Present Illness Michaela Baker is a 73 year old female with diastolic heart failure who presents for a routine follow-up.  Today patient is doing well overall.  She is without any acute cardiovascular concerns or complaints today. She experiences no chest pain or  dyspnea. She is able to perform daily activities such as walking, cooking, and moderate household chores without limitations.  She denies any orthopnea, PND, LEE, weight gain, palpitations, syncope, or presyncope  Review of systems:  Please see the history of present illness. All other systems are reviewed and otherwise negative.       Studies Reviewed:    EKG Interpretation Date/Time:  Friday March 01 2024 15:34:15 EDT Ventricular Rate:  102 PR Interval:  232 QRS Duration:  74 QT Interval:  342 QTC Calculation: 445 R Axis:   35  Text Interpretation: Sinus tachycardia with 1st degree A-V block Nonspecific T wave abnormality When compared with ECG of 27-Jun-2023 11:37, Premature ventricular complexes are no longer Present PR interval has increased T wave amplitude has decreased in Inferior leads Nonspecific T wave abnormality now evident in Lateral leads Confirmed by Rana Dixon [phone]) on 03/01/2024 4:18:44 PM    Echocardiogram 12/20/2022 1. Left ventricular ejection fraction, by estimation, is 60 to 65%. Left  ventricular ejection fraction by 2D MOD biplane is 60.6 %. The left  ventricle has normal function. The left ventricle has no regional wall  motion abnormalities. Left ventricular  diastolic parameters are consistent with Grade I diastolic dysfunction  (impaired relaxation).   2. Right ventricular systolic function is normal. The right ventricular  size is mildly enlarged. There is severely elevated pulmonary artery  systolic pressure. The estimated right ventricular systolic pressure is  61.0 mmHg.   3. The mitral valve is degenerative. Mild mitral valve regurgitation. No  evidence of mitral stenosis.   4. Tricuspid valve regurgitation is mild to moderate.   5. The aortic valve is tricuspid. Aortic valve regurgitation is trivial.  No aortic stenosis is present.   6. The inferior vena cava is dilated in size with <50% respiratory  variability, suggesting right atrial pressure of 15 mmHg.   Echocardiogram 06/01/2022 1. Left ventricular ejection fraction, by estimation, is 60 to 65%. The  left ventricle has normal function. The left ventricle has no regional  wall motion abnormalities. Left ventricular diastolic parameters are  indeterminate.   2. Right ventricular systolic function is normal. The  right ventricular  size is normal. There is mildly elevated pulmonary artery systolic  pressure. The estimated right ventricular systolic pressure is 40.7 mmHg.   3. The mitral valve is grossly normal. Mild mitral valve regurgitation.  No evidence of mitral stenosis.   4. The aortic valve is normal in structure. Aortic valve regurgitation is  not visualized. No aortic stenosis is present.   ZIO 02/07/2020 7 day extended Holter monitor: Dominant rhythm normal sinus.  Heart rate 51 -128 bpm.  Avg HR 69 bpm. No atrial fibrillation, supraventricular tachycardia, ventricular tachycardia, high grade AV block, pauses (3 seconds or longer). Total ventricular ectopic burden ~22.5% (20.1% as isolated beats, 2.4% ventricular couplets, and <1% triplets).  Duration the longest bigeminy event 18 minutes 27 seconds.  Duration and the longest trigeminy event 2 minutes and 28 seconds.  Total supraventricular ectopic burden <1%. Patient triggered events: 4.  Underlying rhythm sinus with either isolated PVCs or PVCs in bigeminal pattern.  Risk Assessment/Calculations:              Physical Exam:   VS:  BP 116/60 (BP Location: Right Arm, Patient Position: Sitting, Cuff Size: Normal)   Pulse (!) 102   Ht 5' 5 (1.651 m)   Wt 167 lb (75.8 kg)   BMI 27.79 kg/m    Wt Readings from Last  3 Encounters:  03/01/24 167 lb (75.8 kg)  08/31/23 168 lb (76.2 kg)  07/05/23 170 lb 6.7 oz (77.3 kg)    GEN: Well nourished, well developed in no acute distress NECK: No JVD; No carotid bruits CARDIAC: RRR, no murmurs, rubs, gallops RESPIRATORY:  Clear to auscultation without rales, wheezing or rhonchi  ABDOMEN: Soft, non-tender, non-distended EXTREMITIES:  No edema; No acute deformity      Assessment and Plan:  HFpEF Echo 11/2022 with LVEF 60 to 65%, no RWMA, grade 1 DD - Today she appears euvolemic and well compensated on exam.  NYHA class I  - No dyspnea, orthopnea, PND, LEE - Weight today is stable 167 LBS -  She is not currently requiring loop diuretic - Continue Farxiga  10 mg daily   Hypertension Blood pressure today is well-controlled at 116/60 - Continue amlodipine  5 mg daily   Frequent PVCs Holter monitor in 2021 with 22% PVC burden.  Was discussed with EP at the time for ablation.  The PVC burden had improved and ablation was not recommended.  She was to focus on weight loss and CPAP compliance Metoprolol  previously discontinued during admission on 07/2023 due to hypotension - EKG today without evidence of PVCs - Today she is without palpitations or symptoms concerning for frequent PVCs - Well-controlled off of beta-blocker   DVT Patient initially diagnosed with DVT in 11/2022. H/o chronic DVT.  Currently on anticoagulation and managed by PCP. Denies bleeding concerns  - Continue Eliquis  5 mg twice daily   Chronic kidney disease stage IV Most recent creatinine of 2.4 and GFR 21 on 8/26 - Managed by nephrology, on Kerendia and Farxiga   Positive SLE and ANCA vasculitis serologies - Management per rheumatology      Dispo:  Return in about 6 months (around 08/30/2024).  Signed, Lum LITTIE Louis, NP

## 2024-03-01 NOTE — Patient Instructions (Signed)
 Medication Instructions:  NO CHANGES  Lab Work: NONE TO BE DONE TODAY.  Testing/Procedures: NONE  Follow-Up: At [facility], you and your health needs are our priority.  As part of our continuing mission to provide you with exceptional heart care, our providers are all part of one team.  This team includes your primary Cardiologist (physician) and Advanced Practice Providers or APPs (Physician Assistants and Nurse Practitioners) who all work together to provide you with the care you need, when you need it.  Your next appointment:  6 MONTHS  Provider:   MADISON FOUNTAIN, NP   We recommend signing up for the patient portal called MyChart.  Sign up information is provided on this After Visit Summary.  MyChart is used to connect with patients for Virtual Visits (Telemedicine).  Patients are able to view lab/test results, encounter notes, upcoming appointments, etc.  Non-urgent messages can be sent to your provider as well.   To learn more about what you can do with MyChart, go to [email or web address].

## 2024-03-26 DIAGNOSIS — E785 Hyperlipidemia, unspecified: Secondary | ICD-10-CM | POA: Diagnosis not present

## 2024-03-26 DIAGNOSIS — E876 Hypokalemia: Secondary | ICD-10-CM | POA: Diagnosis not present

## 2024-03-26 DIAGNOSIS — I10 Essential (primary) hypertension: Secondary | ICD-10-CM | POA: Diagnosis not present

## 2024-03-26 DIAGNOSIS — E039 Hypothyroidism, unspecified: Secondary | ICD-10-CM | POA: Diagnosis not present
